# Patient Record
Sex: Female | Born: 1950 | Race: Black or African American | Hispanic: No | Marital: Single | Smoking: Never smoker
Health system: Southern US, Community
[De-identification: ages and names within clinical notes are randomized; demographics above are authoritative.]

## PROBLEM LIST (undated history)

## (undated) DIAGNOSIS — R1310 Dysphagia, unspecified: Secondary | ICD-10-CM

## (undated) DIAGNOSIS — R058 Other specified cough: Secondary | ICD-10-CM

## (undated) DIAGNOSIS — M1990 Unspecified osteoarthritis, unspecified site: Secondary | ICD-10-CM

## (undated) DIAGNOSIS — I872 Venous insufficiency (chronic) (peripheral): Secondary | ICD-10-CM

## (undated) DIAGNOSIS — H401131 Primary open-angle glaucoma, bilateral, mild stage: Secondary | ICD-10-CM

## (undated) DIAGNOSIS — D573 Sickle-cell trait: Secondary | ICD-10-CM

## (undated) DIAGNOSIS — I5032 Chronic diastolic (congestive) heart failure: Secondary | ICD-10-CM

## (undated) DIAGNOSIS — D689 Coagulation defect, unspecified: Secondary | ICD-10-CM

## (undated) DIAGNOSIS — N3281 Overactive bladder: Secondary | ICD-10-CM

## (undated) DIAGNOSIS — D72829 Elevated white blood cell count, unspecified: Secondary | ICD-10-CM

## (undated) DIAGNOSIS — N183 Chronic kidney disease, stage 3 unspecified: Secondary | ICD-10-CM

## (undated) DIAGNOSIS — K7689 Other specified diseases of liver: Secondary | ICD-10-CM

## (undated) DIAGNOSIS — R011 Cardiac murmur, unspecified: Secondary | ICD-10-CM

## (undated) DIAGNOSIS — I890 Lymphedema, not elsewhere classified: Secondary | ICD-10-CM

## (undated) DIAGNOSIS — R05 Cough: Secondary | ICD-10-CM

## (undated) DIAGNOSIS — R002 Palpitations: Secondary | ICD-10-CM

## (undated) DIAGNOSIS — I82409 Acute embolism and thrombosis of unspecified deep veins of unspecified lower extremity: Secondary | ICD-10-CM

## (undated) DIAGNOSIS — J302 Other seasonal allergic rhinitis: Secondary | ICD-10-CM

## (undated) DIAGNOSIS — F329 Major depressive disorder, single episode, unspecified: Secondary | ICD-10-CM

## (undated) DIAGNOSIS — R7309 Other abnormal glucose: Secondary | ICD-10-CM

## (undated) DIAGNOSIS — I771 Stricture of artery: Secondary | ICD-10-CM

## (undated) DIAGNOSIS — R059 Cough, unspecified: Secondary | ICD-10-CM

## (undated) DIAGNOSIS — K219 Gastro-esophageal reflux disease without esophagitis: Secondary | ICD-10-CM

## (undated) DIAGNOSIS — J309 Allergic rhinitis, unspecified: Secondary | ICD-10-CM

## (undated) DIAGNOSIS — N281 Cyst of kidney, acquired: Secondary | ICD-10-CM

## (undated) DIAGNOSIS — F419 Anxiety disorder, unspecified: Secondary | ICD-10-CM

## (undated) DIAGNOSIS — I8290 Acute embolism and thrombosis of unspecified vein: Secondary | ICD-10-CM

## (undated) DIAGNOSIS — R2681 Unsteadiness on feet: Secondary | ICD-10-CM

## (undated) DIAGNOSIS — E119 Type 2 diabetes mellitus without complications: Secondary | ICD-10-CM

## (undated) DIAGNOSIS — L0390 Cellulitis, unspecified: Secondary | ICD-10-CM

## (undated) DIAGNOSIS — D649 Anemia, unspecified: Secondary | ICD-10-CM

## (undated) DIAGNOSIS — L03115 Cellulitis of right lower limb: Secondary | ICD-10-CM

## (undated) HISTORY — DX: Cellulitis of right lower limb: L03.115

## (undated) HISTORY — DX: Coagulation defect, unspecified: D68.9

## (undated) HISTORY — DX: Other seasonal allergic rhinitis: J30.2

## (undated) HISTORY — DX: Dysphagia, unspecified: R13.10

## (undated) HISTORY — DX: Unsteadiness on feet: R26.81

## (undated) HISTORY — DX: Stricture of artery: I77.1

## (undated) HISTORY — DX: Elevated white blood cell count, unspecified: D72.829

## (undated) HISTORY — DX: Chronic diastolic (congestive) heart failure: I50.32

## (undated) HISTORY — DX: Other abnormal glucose: R73.09

## (undated) HISTORY — DX: Sickle-cell trait: D57.3

## (undated) HISTORY — DX: Lymphedema, not elsewhere classified: I89.0

## (undated) HISTORY — DX: Anxiety disorder, unspecified: F41.9

## (undated) HISTORY — DX: Morbid (severe) obesity due to excess calories: E66.01

## (undated) HISTORY — DX: Overactive bladder: N32.81

## (undated) HISTORY — DX: Venous insufficiency (chronic) (peripheral): I87.2

## (undated) HISTORY — DX: Other specified diseases of liver: K76.89

## (undated) HISTORY — DX: Primary open-angle glaucoma, bilateral, mild stage: H40.1131

## (undated) HISTORY — DX: Cellulitis, unspecified: L03.90

## (undated) HISTORY — DX: Type 2 diabetes mellitus without complications: E11.9

## (undated) HISTORY — DX: Cough, unspecified: R05.9

## (undated) HISTORY — DX: Unspecified osteoarthritis, unspecified site: M19.90

## (undated) HISTORY — DX: Chronic kidney disease, stage 3 unspecified: N18.30

## (undated) HISTORY — PX: COLONOSCOPY: SHX174

## (undated) HISTORY — DX: Acute embolism and thrombosis of unspecified deep veins of unspecified lower extremity: I82.409

## (undated) HISTORY — DX: Cyst of kidney, acquired: N28.1

## (undated) HISTORY — DX: Other specified cough: R05.8

## (undated) HISTORY — DX: Cough: R05

## (undated) HISTORY — DX: Palpitations: R00.2

## (undated) HISTORY — DX: Gastro-esophageal reflux disease without esophagitis: K21.9

## (undated) HISTORY — DX: Allergic rhinitis, unspecified: J30.9

## (undated) HISTORY — DX: Cardiac murmur, unspecified: R01.1

## (undated) HISTORY — DX: Acute embolism and thrombosis of unspecified vein: I82.90

## (undated) HISTORY — DX: Major depressive disorder, single episode, unspecified: F32.9

## (undated) HISTORY — DX: Anemia, unspecified: D64.9

---

## 1973-09-13 HISTORY — PX: COSMETIC SURGERY: SHX468

## 1998-08-20 ENCOUNTER — Other Ambulatory Visit: Admission: RE | Admit: 1998-08-20 | Discharge: 1998-08-20 | Payer: Self-pay | Admitting: *Deleted

## 1999-09-21 ENCOUNTER — Other Ambulatory Visit: Admission: RE | Admit: 1999-09-21 | Discharge: 1999-09-21 | Payer: Self-pay | Admitting: *Deleted

## 2000-01-13 ENCOUNTER — Other Ambulatory Visit: Admission: RE | Admit: 2000-01-13 | Discharge: 2000-01-13 | Payer: Self-pay | Admitting: *Deleted

## 2000-01-13 ENCOUNTER — Encounter (INDEPENDENT_AMBULATORY_CARE_PROVIDER_SITE_OTHER): Payer: Self-pay

## 2000-09-13 DIAGNOSIS — I82409 Acute embolism and thrombosis of unspecified deep veins of unspecified lower extremity: Secondary | ICD-10-CM

## 2000-09-13 DIAGNOSIS — D649 Anemia, unspecified: Secondary | ICD-10-CM

## 2000-09-13 HISTORY — DX: Acute embolism and thrombosis of unspecified deep veins of unspecified lower extremity: I82.409

## 2000-09-13 HISTORY — DX: Anemia, unspecified: D64.9

## 2001-01-12 ENCOUNTER — Other Ambulatory Visit: Admission: RE | Admit: 2001-01-12 | Discharge: 2001-01-12 | Payer: Self-pay | Admitting: *Deleted

## 2001-06-08 ENCOUNTER — Ambulatory Visit: Admission: RE | Admit: 2001-06-08 | Discharge: 2001-06-08 | Payer: Self-pay | Admitting: Internal Medicine

## 2001-06-09 ENCOUNTER — Inpatient Hospital Stay (HOSPITAL_COMMUNITY): Admission: AD | Admit: 2001-06-09 | Discharge: 2001-06-13 | Payer: Self-pay | Admitting: Internal Medicine

## 2001-07-13 ENCOUNTER — Encounter: Payer: Self-pay | Admitting: *Deleted

## 2001-07-14 HISTORY — PX: TOTAL ABDOMINAL HYSTERECTOMY: SHX209

## 2001-07-19 ENCOUNTER — Inpatient Hospital Stay (HOSPITAL_COMMUNITY): Admission: RE | Admit: 2001-07-19 | Discharge: 2001-07-21 | Payer: Self-pay | Admitting: *Deleted

## 2001-07-19 ENCOUNTER — Encounter (INDEPENDENT_AMBULATORY_CARE_PROVIDER_SITE_OTHER): Payer: Self-pay | Admitting: Specialist

## 2002-05-11 ENCOUNTER — Ambulatory Visit (HOSPITAL_COMMUNITY): Admission: RE | Admit: 2002-05-11 | Discharge: 2002-05-11 | Payer: Self-pay | Admitting: Occupational Medicine

## 2002-05-23 ENCOUNTER — Encounter: Admission: RE | Admit: 2002-05-23 | Discharge: 2002-05-23 | Payer: Self-pay | Admitting: Occupational Medicine

## 2002-05-23 ENCOUNTER — Encounter: Payer: Self-pay | Admitting: Occupational Medicine

## 2002-07-19 ENCOUNTER — Encounter: Payer: Self-pay | Admitting: Emergency Medicine

## 2002-07-19 ENCOUNTER — Emergency Department (HOSPITAL_COMMUNITY): Admission: EM | Admit: 2002-07-19 | Discharge: 2002-07-19 | Payer: Self-pay | Admitting: Emergency Medicine

## 2002-11-29 ENCOUNTER — Ambulatory Visit (HOSPITAL_COMMUNITY): Admission: RE | Admit: 2002-11-29 | Discharge: 2002-11-29 | Payer: Self-pay | Admitting: Orthopedic Surgery

## 2003-07-01 ENCOUNTER — Ambulatory Visit (HOSPITAL_COMMUNITY): Admission: RE | Admit: 2003-07-01 | Discharge: 2003-07-01 | Payer: Self-pay | Admitting: Internal Medicine

## 2003-07-01 ENCOUNTER — Encounter: Payer: Self-pay | Admitting: Internal Medicine

## 2006-05-20 ENCOUNTER — Ambulatory Visit: Payer: Self-pay | Admitting: Internal Medicine

## 2006-08-19 ENCOUNTER — Ambulatory Visit: Payer: Self-pay | Admitting: Internal Medicine

## 2006-11-11 ENCOUNTER — Ambulatory Visit: Payer: Self-pay | Admitting: Internal Medicine

## 2007-03-13 ENCOUNTER — Ambulatory Visit: Payer: Self-pay | Admitting: Internal Medicine

## 2007-08-01 DIAGNOSIS — D573 Sickle-cell trait: Secondary | ICD-10-CM | POA: Insufficient documentation

## 2007-08-01 DIAGNOSIS — J309 Allergic rhinitis, unspecified: Secondary | ICD-10-CM | POA: Insufficient documentation

## 2007-08-01 DIAGNOSIS — I82409 Acute embolism and thrombosis of unspecified deep veins of unspecified lower extremity: Secondary | ICD-10-CM | POA: Insufficient documentation

## 2007-08-01 DIAGNOSIS — H409 Unspecified glaucoma: Secondary | ICD-10-CM | POA: Insufficient documentation

## 2007-08-01 DIAGNOSIS — I872 Venous insufficiency (chronic) (peripheral): Secondary | ICD-10-CM | POA: Insufficient documentation

## 2007-08-01 DIAGNOSIS — J45909 Unspecified asthma, uncomplicated: Secondary | ICD-10-CM | POA: Insufficient documentation

## 2007-10-13 ENCOUNTER — Encounter: Payer: Self-pay | Admitting: Internal Medicine

## 2008-02-06 ENCOUNTER — Ambulatory Visit: Payer: Self-pay | Admitting: Vascular Surgery

## 2008-02-06 DIAGNOSIS — F32A Depression, unspecified: Secondary | ICD-10-CM

## 2008-02-06 HISTORY — DX: Depression, unspecified: F32.A

## 2008-06-21 ENCOUNTER — Ambulatory Visit: Payer: Self-pay | Admitting: Vascular Surgery

## 2008-07-08 ENCOUNTER — Ambulatory Visit: Payer: Self-pay | Admitting: Oncology

## 2008-07-17 LAB — CBC & DIFF AND RETIC
BASO%: 0 % (ref 0.0–2.0)
EOS%: 1.2 % (ref 0.0–7.0)
HCT: 35.6 % (ref 34.8–46.6)
LYMPH%: 15.7 % (ref 14.0–48.0)
MCH: 28 pg (ref 26.0–34.0)
MCHC: 33.6 g/dL (ref 32.0–36.0)
MCV: 83.2 fL (ref 81.0–101.0)
MONO%: 3.5 % (ref 0.0–13.0)
NEUT%: 79.6 % — ABNORMAL HIGH (ref 39.6–76.8)
Platelets: 268 10*3/uL (ref 145–400)

## 2008-07-17 LAB — ERYTHROCYTE SEDIMENTATION RATE: Sed Rate: 25 mm/hr (ref 0–30)

## 2008-07-17 LAB — MORPHOLOGY: RBC Comments: NORMAL

## 2008-07-18 LAB — COMPREHENSIVE METABOLIC PANEL
CO2: 27 mEq/L (ref 19–32)
Calcium: 10.6 mg/dL — ABNORMAL HIGH (ref 8.4–10.5)
Chloride: 104 mEq/L (ref 96–112)
Creatinine, Ser: 0.86 mg/dL (ref 0.40–1.20)
Glucose, Bld: 98 mg/dL (ref 70–99)
Total Bilirubin: 0.4 mg/dL (ref 0.3–1.2)

## 2008-07-18 LAB — LACTATE DEHYDROGENASE: LDH: 209 U/L (ref 94–250)

## 2008-07-18 LAB — ANA: Anti Nuclear Antibody(ANA): NEGATIVE

## 2008-07-31 ENCOUNTER — Encounter: Payer: Self-pay | Admitting: Internal Medicine

## 2008-08-05 ENCOUNTER — Ambulatory Visit (HOSPITAL_COMMUNITY): Admission: RE | Admit: 2008-08-05 | Discharge: 2008-08-05 | Payer: Self-pay | Admitting: Oncology

## 2008-08-14 ENCOUNTER — Encounter: Admission: RE | Admit: 2008-08-14 | Discharge: 2008-09-12 | Payer: Self-pay | Admitting: Oncology

## 2008-08-15 ENCOUNTER — Ambulatory Visit (HOSPITAL_COMMUNITY): Admission: RE | Admit: 2008-08-15 | Discharge: 2008-08-15 | Payer: Self-pay | Admitting: Oncology

## 2008-08-23 ENCOUNTER — Encounter: Payer: Self-pay | Admitting: Internal Medicine

## 2008-08-23 ENCOUNTER — Ambulatory Visit: Payer: Self-pay | Admitting: Oncology

## 2008-09-16 ENCOUNTER — Encounter: Admission: RE | Admit: 2008-09-16 | Discharge: 2008-09-20 | Payer: Self-pay | Admitting: Oncology

## 2009-11-17 ENCOUNTER — Ambulatory Visit: Payer: Self-pay | Admitting: Vascular Surgery

## 2009-11-17 ENCOUNTER — Encounter (INDEPENDENT_AMBULATORY_CARE_PROVIDER_SITE_OTHER): Payer: Self-pay | Admitting: Emergency Medicine

## 2009-11-17 ENCOUNTER — Emergency Department (HOSPITAL_COMMUNITY): Admission: EM | Admit: 2009-11-17 | Discharge: 2009-11-17 | Payer: Self-pay | Admitting: Emergency Medicine

## 2009-11-24 ENCOUNTER — Encounter (INDEPENDENT_AMBULATORY_CARE_PROVIDER_SITE_OTHER): Payer: Self-pay | Admitting: Emergency Medicine

## 2009-11-24 ENCOUNTER — Emergency Department (HOSPITAL_COMMUNITY): Admission: EM | Admit: 2009-11-24 | Discharge: 2009-11-24 | Payer: Self-pay | Admitting: Emergency Medicine

## 2009-12-11 ENCOUNTER — Ambulatory Visit: Payer: Self-pay | Admitting: Vascular Surgery

## 2010-05-19 ENCOUNTER — Encounter (INDEPENDENT_AMBULATORY_CARE_PROVIDER_SITE_OTHER): Payer: Self-pay | Admitting: *Deleted

## 2010-05-29 ENCOUNTER — Encounter (INDEPENDENT_AMBULATORY_CARE_PROVIDER_SITE_OTHER): Payer: Self-pay | Admitting: *Deleted

## 2010-06-03 ENCOUNTER — Ambulatory Visit: Payer: Self-pay | Admitting: Internal Medicine

## 2010-07-06 ENCOUNTER — Ambulatory Visit: Payer: Self-pay | Admitting: Internal Medicine

## 2010-07-07 ENCOUNTER — Telehealth: Payer: Self-pay | Admitting: Internal Medicine

## 2010-10-15 NOTE — Procedures (Signed)
Summary: Colonoscopy  Patient: Jessica Yates Note: All result statuses are Final unless otherwise noted.  Tests: (1) Colonoscopy (COL)   COL Colonoscopy           DONE     [facility]     [address]     [place], [place]  [address]           COLONOSCOPY PROCEDURE REPORT           PATIENT:  Jessica Yates, Jessica Yates  MR#:  [id number]     BIRTHDATE:  06-28-1951, 59 yrs. old  GENDER:  female     ENDOSCOPIST:  Hedwig Morton. Juanda Chance, MD     REF. BY:  Kyra Manges, M.D.     PROCEDURE DATE:  07/06/2010     PROCEDURE:  Colonoscopy 09323     ASA CLASS:  Class I     INDICATIONS:  Routine Risk Screening     MEDICATIONS:   Versed 8 mg, Fentanyl 75 mcg           DESCRIPTION OF PROCEDURE:   After the risks benefits and     alternatives of the procedure were thoroughly explained, informed     consent was obtained.  Digital rectal exam was performed and     revealed no rectal masses.   The LB CF-[id number] endoscope     was introduced through the anus and advanced to the cecum, which     was identified by both the appendix and ileocecal valve, without     limitations.  The quality of the prep was excellent, using     MoviPrep.  The instrument was then slowly withdrawn as the colon     was fully examined.     <<PROCEDUREIMAGES>>           FINDINGS:  No polyps or cancers were seen (see image1, image2,     image3, image4, and image5).   Retroflexed views in the rectum     revealed no abnormalities.    The scope was then withdrawn from     the patient and the procedure completed.           COMPLICATIONS:  None     ENDOSCOPIC IMPRESSION:     1) No polyps or cancers     RECOMMENDATIONS:     1) high fiber diet     REPEAT EXAM:  In 10 year(s) for.  no risk factors for colon     cancer, recall according to the guidelines           ______________________________     Hedwig Morton. Juanda Chance, MD           CC:           n.     eSIGNED:   Hedwig Morton. Brodie at 07/06/2010 02:22 PM           Antoine Poche, [id number]  Note: An exclamation mark (!) indicates a result that was not dispersed into the flowsheet. Document Creation Date: 07/06/2010 2:24 PM _______________________________________________________________________  (1) Order result status: Final Collection or observation date-time: 07/06/2010 14:17 Requested date-time:  Receipt date-time:  Reported date-time:  Referring Physician:   Ordering Physician: Lina Sar [id number]) Specimen Source:  Source: Launa Grill Order Number: [id number] Lab site:   Appended Document: Colonoscopy    Clinical Lists Changes  Observations: Added new observation of COLONNXTDUE: 06/2020 (07/06/2010 16:03)

## 2010-10-15 NOTE — Progress Notes (Signed)
Summary: Speak directly w/Dr. Juanda Chance  Phone Note Call from Patient Call back at Home Phone [phone] Call back at [phone]   Caller: Patient Call For: Dr. Juanda Chance Reason for Call: Talk to Nurse Summary of Call: Requsting to speak directly w/Dr. Juanda Chance Initial call taken by: Karna Christmas,  July 07, 2010 12:21 PM  Follow-up for Phone Call        Patient called and wanted to "speak directly to Dr. Juanda Chance." Patient was concerned about the pictures of her colonoscopy from 07/06/10. She states her sister's pictures were not as dark as her pictures and this worried her. Explained to patient that different equipment may cause pictures to look a little different. Patient is also worried about a birthmark she has on the middle of her buttocks. She wants to know if this could cause cancer. Patient also asked questions about IBS symptoms. Answered questions for patient. She continues to request Dr. Juanda Chance call her at the cell or home number. Explained to patient that MD iis in clinic today and that I would give her the message and either MD or I would call with answers to her questions. Follow-up by: Jesse Fall RN,  July 07, 2010 2:49 PM  Additional Follow-up for Phone Call Additional follow up Details #1::        I have spoken to the pt at length, all concerns dissipated. I asked her to see me for an OV if more qquestions. Additional Follow-up by: Hart Carwin MD,  July 07, 2010 6:12 PM

## 2010-10-15 NOTE — Letter (Signed)
Summary: [facility] Instructions  [facility]  [address] [place], [place] [address]   Phone: [phone]  Fax: [phone]       [facility]    03/10/1951    MRN: [id number]        Procedure Day /Date: Monday 07-06-10     Arrival Time: 1:00 p.m.     Procedure Time: 2:00 p.m.     Location of Procedure:                    _x _  [facility] (4th Floor)                       PREPARATION FOR COLONOSCOPY WITH MOVIPREP   Starting 5 days prior to your procedure  07/01/10 do not eat nuts, seeds, popcorn, corn, beans, peas,  salads, or any raw vegetables.  Do not take any fiber supplements (e.g. Metamucil, Citrucel, and Benefiber).  THE DAY BEFORE YOUR PROCEDURE         DATE:  07/05/10  DAY:  Sunday  1.  Drink clear liquids the entire day-NO SOLID FOOD  2.  Do not drink anything colored red or purple.  Avoid juices with pulp.  No orange juice.  3.  Drink at least 64 oz. (8 glasses) of fluid/clear liquids during the day to prevent dehydration and help the prep work efficiently.  CLEAR LIQUIDS INCLUDE: Water Jello Ice Popsicles Tea (sugar ok, no milk/cream) Powdered fruit flavored drinks Coffee (sugar ok, no milk/cream) Gatorade Juice: apple, white grape, white cranberry  Lemonade Clear bullion, consomm, broth Carbonated beverages (any kind) Strained chicken noodle soup Hard Candy                             4.  In the morning, mix first dose of MoviPrep solution:    Empty 1 Pouch A and 1 Pouch B into the disposable container    Add lukewarm drinking water to the top line of the container. Mix to dissolve    Refrigerate (mixed solution should be used within 24 hrs)  5.  Begin drinking the prep at 5:00 p.m. The MoviPrep container is divided by 4 marks.   Every 15 minutes drink the solution down to the next mark (approximately 8 oz) until the full liter is complete.   6.  Follow completed prep with 16 oz of clear liquid of your choice  (Nothing red or purple).  Continue to drink clear liquids until bedtime.  7.  Before going to bed, mix second dose of MoviPrep solution:    Empty 1 Pouch A and 1 Pouch B into the disposable container    Add lukewarm drinking water to the top line of the container. Mix to dissolve    Refrigerate  THE DAY OF YOUR PROCEDURE      DATE:  07-06-10  DAY:  Monday  Beginning at  9:00 a.m. (5 hours before procedure):         1. Every 15 minutes, drink the solution down to the next mark (approx 8 oz) until the full liter is complete.  2. Follow completed prep with 16 oz. of clear liquid of your choice.    3. You may drink clear liquids until  12:00 p.m.  (2 HOURS BEFORE PROCEDURE).   MEDICATION INSTRUCTIONS  Unless otherwise instructed, you should take regular prescription medications with a small sip of water  as early as possible the morning of your procedure.       OTHER INSTRUCTIONS  You will need a responsible adult at least 60 years of age to accompany you and drive you home.   This person must remain in the waiting room during your procedure.  Wear loose fitting clothing that is easily removed.  Leave jewelry and other valuables at home.  However, you may wish to bring a book to read or  an iPod/MP3 player to listen to music as you wait for your procedure to start.  Remove all body piercing jewelry and leave at home.  Total time from sign-in until discharge is approximately 2-3 hours.  You should go home directly after your procedure and rest.  You can resume normal activities the  day after your procedure.  The day of your procedure you should not:   Drive   Make legal decisions   Operate machinery   Drink alcohol   Return to work  You will receive specific instructions about eating, activities and medications before you leave.    The above instructions have been reviewed and explained to me by   Wyona Almas RN  June 03, 2010 3:29 PM     I  fully understand and can verbalize these instructions _____________________________ Date _________

## 2010-10-15 NOTE — Miscellaneous (Signed)
Summary: [facility] Previsit/prep  Clinical Lists Changes  Medications: Added new medication of MOVIPREP 100 GM  SOLR (PEG-KCL-NACL-NASULF-NA ASC-C) As per prep instructions. - Signed Rx of MOVIPREP 100 GM  SOLR (PEG-KCL-NACL-NASULF-NA ASC-C) As per prep instructions.;  #1 x 0;  Signed;  Entered by: Wyona Almas RN;  Authorized by: Hart Carwin MD;  Method used: Electron[facility] to [facility] [address] [phone]*, [address] [place], [place], [place]  [id number], Ph: [phone] or [phone], Fax: [phone] Observations: Added new observation of NKA: T (06/03/2010 14:36)    Prescriptions: MOVIPREP 100 GM  SOLR (PEG-KCL-NACL-NASULF-NA ASC-C) As per prep instructions.  #1 x 0   Entered by:   Wyona Almas RN   Authorized by:   Hart Carwin MD   Signed by:   Wyona Almas RN on 06/03/2010   Method used:   [facility] to        [facility] [phone]* (retail)       [address]       [place], [place]  [id number]       Ph: [phone] or [phone]       Fax: [phone]   RxID:   [id number]

## 2010-10-15 NOTE — Letter (Signed)
Summary: Pre Visit Letter Revised  [facility]  [address] [place], [place] [address]   Phone: [phone]  Fax: [phone]        05/19/2010 MRN: [id number]   [facility] [address] [place], [place]  [address]   Welcome to the Gastroenterology Division at [facility].    You are scheduled to see a nurse for your pre-procedure visit on June 02, 2010 at 10:00am on the 3rd floor at [facility], [address]  We ask that you try to arrive at our office 15 minutes prior to your appointment time to allow for check-in.  Please take a minute to review the attached form.  If you answer "Yes" to one or more of the questions on the first page, we ask that you call the person listed at your earliest opportunity.  If you answer "No" to all of the questions, please complete the rest of the form and bring it to your appointment.    Your nurse visit will consist of discussing your medical and surgical history, your immediate family medical history, and your medications.   If you are unable to list all of your medications on the form, please bring the medication bottles to your appointment and we will list them.  We will need to be aware of both prescribed and over the counter drugs.  We will need to know exact dosage information as well.    Please be prepared to read and sign documents such as consent forms, a financial agreement, and acknowledgement forms.  If necessary, and with your consent, a friend or relative is welcome to sit-in on the nurse visit with you.  Please bring your insurance card so that we may make a copy of it.  If your insurance requires a referral to see a specialist, please bring your referral form from your primary care physician.  No co-pay is required for this nurse visit.     If you cannot keep your appointment, please call [phone] to cancel or reschedule prior to your appointment date.  This allows [place] the opportunity to  schedule an appointment for another patient in need of care.    Thank you for choosing [facility] for your medical needs.  We appreciate the opportunity to care for you.  Please visit [place] at our website  to learn more about our practice.  Sincerely, The Gastroenterology Division

## 2010-11-18 ENCOUNTER — Ambulatory Visit: Payer: Self-pay | Admitting: Vascular Surgery

## 2010-12-02 ENCOUNTER — Ambulatory Visit (INDEPENDENT_AMBULATORY_CARE_PROVIDER_SITE_OTHER): Payer: BC Managed Care – PPO | Admitting: Vascular Surgery

## 2010-12-02 DIAGNOSIS — I890 Lymphedema, not elsewhere classified: Secondary | ICD-10-CM

## 2010-12-03 NOTE — Assessment & Plan Note (Signed)
OFFICE VISIT  Jessica Yates, Jessica Yates DOB:  01-May-1951                                       12/02/2010 ZOXWR#:[id number]  I saw Ms. Zeidan in the office today for continued follow-up of her lymphedema.  I last saw her December 11, 2009.  Since I saw her last, she has had no recurrent episodes of lymphangitis.  Her swelling comes and goes and obviously it increases whenever she is on her feet for any length of time.  She states that she has not gotten new compression stockings in the last year.  She had no fever or chills.  REVIEW OF SYSTEMS:  She has had no chest pain, chest pressure, palpitations or arrhythmias.  PHYSICAL EXAMINATION:  This is a pleasant 60 year old woman who appears her stated age. Blood pressure is 145/87, heart rate is 88, respiratory rate 24. LUNGS:  Clear bilaterally to auscultation. CARDIOVASCULAR:  I do not detect any carotid bruits.  She has a regular rate and rhythm.  She has palpable dorsalis pedis pulses bilaterally.  I cannot palpate posterior tibial pulses because of her leg swelling. EXTREMITIES:  Exam reveals significant bilateral nonpitting edema more significant on the left side.  This is classic for lymphedema.  We have again discussed the importance of intermittent leg elevation and I have written her a prescription for compression stockings with a 20-30 mm pressure gradient.  She would like to be seen on a yearly basis and I will see her back in 1 year.  She knows to call sooner if she has problems.    [name]. Edilia Bo, M.D. Electronically Signed  CSD/MEDQ  D:  12/02/2010  T:  12/03/2010  Job:  [id number]

## 2010-12-06 LAB — PROTIME-INR
INR: 0.88 (ref 0.00–1.49)
Prothrombin Time: 11.9 seconds (ref 11.6–15.2)

## 2010-12-06 LAB — DIFFERENTIAL
Basophils Absolute: 0 10*3/uL (ref 0.0–0.1)
Basophils Relative: 0 % (ref 0–1)
Neutro Abs: 5.9 10*3/uL (ref 1.7–7.7)
Neutrophils Relative %: 78 % — ABNORMAL HIGH (ref 43–77)

## 2010-12-06 LAB — COMPREHENSIVE METABOLIC PANEL
Alkaline Phosphatase: 82 U/L (ref 39–117)
BUN: 7 mg/dL (ref 6–23)
Chloride: 105 mEq/L (ref 96–112)
Glucose, Bld: 100 mg/dL — ABNORMAL HIGH (ref 70–99)
Potassium: 4.2 mEq/L (ref 3.5–5.1)
Total Bilirubin: 0.6 mg/dL (ref 0.3–1.2)

## 2010-12-06 LAB — CBC
HCT: 33.8 % — ABNORMAL LOW (ref 36.0–46.0)
Hemoglobin: 10.7 g/dL — ABNORMAL LOW (ref 12.0–15.0)
RDW: 15.4 % (ref 11.5–15.5)

## 2010-12-06 LAB — APTT: aPTT: 32 seconds (ref 24–37)

## 2011-01-22 ENCOUNTER — Ambulatory Visit (INDEPENDENT_AMBULATORY_CARE_PROVIDER_SITE_OTHER): Payer: BC Managed Care – PPO

## 2011-01-22 ENCOUNTER — Inpatient Hospital Stay (INDEPENDENT_AMBULATORY_CARE_PROVIDER_SITE_OTHER)
Admission: RE | Admit: 2011-01-22 | Discharge: 2011-01-22 | Disposition: A | Discharge status: Home / Self Care | Payer: BC Managed Care – PPO | Source: Ambulatory Visit | Attending: Emergency Medicine | Admitting: Emergency Medicine

## 2011-01-22 DIAGNOSIS — S9030XA Contusion of unspecified foot, initial encounter: Secondary | ICD-10-CM

## 2011-01-22 DIAGNOSIS — S60229A Contusion of unspecified hand, initial encounter: Secondary | ICD-10-CM

## 2011-01-26 NOTE — Assessment & Plan Note (Signed)
[facility]                             PULMONARY OFFICE NOTE   Jessica Yates, Jessica Yates               MRN:          [id number]  DATE:03/13/2007                            DOB:          12-11-1950    PROBLEM LIST:  1. Asthma/cough.  2. Allergic rhinitis.  3. Peripheral venous insufficiency.  4. Deep venous thrombosis left leg 2002.  5. Sickle cell trait.  6. Glaucoma.   HISTORY:  Since last year, diagnosed with glaucoma.  Cough in May x2  weeks with chest tightness attributed to environmental conditions and  now resolved.   MEDICATIONS:  1. Fish oil.  2. Symbicort 160/4.5.  3. Proventil HFA inhaler p.r.n.  4. No medication allergy.   OBJECTIVE:  VITAL SIGNS:  Weight 191 pounds, BP 128/64, pulse 98, room  air saturation 98%.  There is palpable crepitus along the right  sternocostal joint which appears to be consistent with some slippage at  those joints.  She relates this to a history of a fall onto her shoulder  but is not having any pain.  Chest is otherwise quiet without wheeze.  Heart sounds are regular without murmur.   IMPRESSION:  1. Asthma, resolved after exacerbation in the spring.  2. Chronic peripheral venous disease with chronic ankle edema.  3. 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   PLAN:  1. Refill Symbicort 160/4.5 and Proventil inhaler.  2. Schedule pulmonary function tests.  3. Schedule return 2 months, earlier p.r.n.     Clinton D. Maple Hudson, MD, Tonny Bollman, FACP  Electronically Signed    CDY/MedQ  DD: 03/13/2007  DT: 03/14/2007  Job #: [id number]

## 2011-01-26 NOTE — Procedures (Signed)
DUPLEX DEEP VENOUS EXAM - LOWER EXTREMITY   INDICATION:  Left leg pain and swelling.   HISTORY:  Edema:  Yes.  Trauma/Surgery:  No.  Pain:  Yes.  PE:  No.  Previous DVT:  Yes in 2002.  Anticoagulants:  Other:   DUPLEX EXAM:                CFV   SFV   PopV  PTV    GSV                R  L  R  L  R  L  R   L  R  L  Thrombosis    o  o     o     o      o     o  Spontaneous   +  +     +     +      +     +  Phasic        +  +     +     +      +     +  Augmentation  +  +     +     +      +     +  Compressible  +  +     +     +      +     +  Competent     +  +     +     +      +     +   Legend:  + - yes  o - no  p - partial  D - decreased   IMPRESSION:  No evidence of deep venous thrombosis noted in the left  leg.   Notified Dr. Domingo Pulse with results.    _____________________________  Quita Skye Hart [place], M.D.   MG/MEDQ  D:  06/21/2008  T:  06/21/2008  Job:  [id number]

## 2011-01-26 NOTE — Assessment & Plan Note (Signed)
OFFICE VISIT   Jessica Yates, Jessica Yates  DOB:  1951/06/12                                       02/06/2008  ZOXWR#:[id number]   I saw the patient in the office today in consultation concerning her leg  swelling.  This is a pleasant 60 year old woman who was referred by Dr.  Domingo Pulse.  She states that she has had problems with swelling in  her legs since she was 60 years old and this has been attributed to  lymphedema.  She has worn compression stockings in the past but found  them to be quite a nuisance.  She also intermittently elevates her legs  some.   In September 2002, she did have a DVT in the left leg which was treated  temporarily with Coumadin.  She has had no further episodes of DVT since  then.  She has had a hysterectomy in November of 2002.  She has never  had surgery in the groins and no history of radiation therapy in the  abdomen or groins.  She states that elevation and compression therapy  does help her swelling some and standing aggravates her symptoms.  She  has had no real pain associated with the swelling in her legs.   PAST MEDICAL HISTORY:  Is fairly unremarkable.  She denies any history  of diabetes, hypertension, history of previous myocardial infarction,  history of congestive heart failure history of COPD.   FAMILY HISTORY:  Her mother had diabetes and hypertension.  She also an  enlarged heart.  She is unaware of any history of premature  cardiovascular disease.   SOCIAL HISTORY:  She is single.  She does not have any children.  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  She  does not use tobacco.   REVIEW OF SYSTEMS AND MEDICATIONS:  Are documented on the medical  history form in her chart.   PHYSICAL EXAMINATION:  This is a pleasant 59 year old woman who appears  her stated age.  Blood pressure is 138/82, heart rate is 98.  Neck is  supple.  There is no cervical lymphadenopathy.  I  do not detect any  carotid bruits.  Lungs are clear bilaterally to auscultation.  On  cardiac exam, she has a regular rate and rhythm. __________   Finally, I have given her the number for Oleh Genin, physical  therapist, who does massage therapy and is a certified lymphedema  therapist.  I have explained that this problem is not a serious problem  but it can be a nuisance, but that it is a chronic problem with no real  cure.  I would be happy to see her back in the future if her symptoms  worsen or she has other vascular issues.  She had a large number of  questions and I have done my best to answer all of her questions here in  the office today.   [name]. Edilia Bo, M.D.  Electronically Signed   CSD/MEDQ  D:  02/06/2008  T:  02/07/2008  Job:  [id number]

## 2011-01-26 NOTE — Assessment & Plan Note (Signed)
OFFICE VISIT   Jessica Yates, Jessica Yates  DOB:  27-Dec-1950                                       12/11/2009  ZOXWR#:[id number]   I saw the patient in the office today for continued followup of her  lymphedema.  I last saw her approximately 2 years ago and recommended  intermittent elevation therapy and compression therapy.  Since I saw her  last she had been doing reasonably well until early March she developed  some lymphangitis.  She was started on p.o. clindamycin which she took  for a week.  Shen then returned to the emergency department and was  started on doxycycline which she took for 2 weeks.  Her cellulitis and  lymphangitis has resolved.  She comes in with a chief complaint of  worsening left leg swelling.  Of note, she did have a venous duplex scan  done which showed no evidence of DVT when she was seen in the emergency  department.  She has had no real pain associated with the swelling in  her leg.  She has had no shortness of breath.  Since I saw her last she  does try to elevate her legs intermittently.  She has not really been  wearing compression stockings faithfully.  She also had gone to the  massage therapist for lymphedema massage and this did help some.   The patient has had some mild paresthesias in the lateral aspect of her  left foot.  These began gradually several months ago.  They have been  stable and have not progressed.  There are no aggravating or alleviating  factors.  There is no associated symptoms.  These symptoms were  described as mild.   PAST MEDICAL HISTORY:  Significant for mild asthma.  She has no history  of diabetes, hypertension, hypercholesterolemia, history of previous  myocardial infarction, history of congestive heart failure.   FAMILY HISTORY:  There is no history of premature cardiovascular  disease.   SOCIAL HISTORY:  She is single.  She does not have any children.  She  does not smoke cigarettes.   REVIEW OF SYSTEMS:  She has had some weight gain.  She is 195 pounds.  She has had no change in her appetite.  CARDIOVASCULAR:  She has had no chest pain, chest pressure, palpitations  or arrhythmias.  She has had no claudication, rest pain or nonhealing  ulcers.  She has had no history of DVT or phlebitis since her last  visit.  Pulmonary, GI, GU, neurologic, musculoskeletal, psychiatric, ENT and  hematologic review of systems is unremarkable.   PHYSICAL EXAMINATION:  General:  This is a pleasant 60 year old woman  who appears her stated age.  Vital signs:  Her blood pressure is 118/75,  heart rate is 94, temperature 97.6.  HEENT:  Unremarkable.  Lungs:  Are  clear bilaterally to auscultation without rales, rhonchi or wheezing.  Cardiovascular:  I do not appreciate any carotid bruits.  She has a  regular rate and rhythm.  No murmurs are appreciated.  She has 3+  pitting edema on the left and 1+ pitting edema on the right.  I cannot  palpate pedal pulses because of her swelling but both feet are warm and  well-perfused.  Abdomen:  Soft and nontender.  No masses are  appreciated.  Musculoskeletal:  There are no major deformities  or  cyanosis.  Neurologic:  She has had no focal weakness or paresthesias.  Skin:  She does have some hyperpigmentation more significantly on the  left side consistent with some mild chronic venous insufficiency.   She did have a previous CT scan and MRI which I have reviewed and this  was from 2009 and showed no evidence of intrinsic mass compressing the  lymphatics to explain her lymphedema.  She had some renal cysts which  appeared benign.   I have reviewed her venous duplex scan which was done on 11/17/2009 and  showed no evidence of DVT.  I have also reviewed her followup venous  duplex scan on 11/24/2009 which showed no evidence of DVT.   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  We discussed that this  is a chronic problem and that  she will have this the rest of her life.  We have discussed the  importance of intermittent leg elevation and I have instructed her on  the proper positioning for this.  I have also written her a prescription  for a thigh high compression stocking with a 20-30 mmHg gradient.  She  felt that the 30-40 mm gradient stockings were too tight and she is less  likely to wear these.  When she does have episodes of cellulitis or  lymphangitis I have explained that she would likely require 4-6 weeks of  antibiotics versus normal 1 week course of antibiotics.  I would be  happy to see her back in the future if any new problems arise.     [name]. Edilia Bo, M.D.  Electronically Signed   CSD/MEDQ  D:  12/11/2009  T:  12/12/2009  Job:  [id number]

## 2011-01-29 NOTE — Discharge Summary (Signed)
[facility]  Patient:    Jessica Yates, Jessica Yates [facility] Visit Number: [id number] MRN: [id number]          Service Type: GYN Location: 4W 0460 02 Attending Physician:  Lanna Poche Dictated by:   Darcella Cheshire, M.D. Admit Date:  07/19/2001 Discharge Date: 07/21/2001   CC:         Darcella Cheshire, M.D. (2)   Discharge Summary  HISTORY AND HOSPITAL COURSE:  The patient is a 60 year old black single female admitted on July 19, 2001, for the purpose of doing a total abdominal hysterectomy.  The patient was taken to the operating room from outpatient admission where a total abdominal hysterectomy was done removing a large, very fixed tumor from her pelvis.  The patients operative and postoperative course has been uneventful and being discharged on postoperative day #2 afebrile and in good condition, given the usual postoperative instructions.  She is to come to the office next Tuesday for stitch removal and further instructions will be given at that time.  LABORATORY AND ACCESSORY DATA:  Pathology report showed mild chronic cervicitis, a nabothian cyst, benign endometrial polyp, multiple leiomyomata, and focal adenomyosis.  Hemoglobin postoperatively was 12 g and hematocrit was 35.5.  The patients other laboratory studies were within normal limits. Radiology showed mild peribronchial thickening, no evidence of focal air space disease, anterior wedging of the mid thoracic vertebra, with a note of undetermined chronicity.  EKG showed normal sinus rhythm, nonspecific T-wave abnormality.  There was no old tracing which to compare.  FINAL CLINICAL IMPRESSION:  Fibroid leiomyomata uteri, large and fixed adenomyosis uteri.       OPERATION:  Total abdominal hysterectomy. Dictated by:   Darcella Cheshire, M.D. Attending Physician:  Lanna Poche DD:  07/21/01 TD:  07/22/01 Job: [id number]

## 2011-01-29 NOTE — Discharge Summary (Signed)
[place]  Patient:    Jessica Yates, Jessica Yates Visit Number: [id number] MRN: [id number]          Service Type: MED Location: [id number] Attending Physician:  Virgia Land Dictated by:   Lindell Spar. Chestine Spore, M.D. Admit Date:  06/09/2001 Discharge Date: 06/13/2001                             Discharge Summary  DISCHARGE DIAGNOSES: 1. Venous and _________ obstruction secondary to an extensive quite    enlarged uterine leiomyoma. 2. Menometrorrhagia with iron-deficiency anemia.  REASON FOR ADMISSION:  Jessica Yates is a 60 year old African-American woman who presented to the office with a four to five-day history of progressive pain and swelling of the left leg and minimal swelling of the right leg.  An outpatient venous Doppler performed at [facility] revealed evidence of thrombosis of the greater saphenous vein and the patient was admitted for short-term anticoagulation, bedrest with elevation of the legs, and evaluation of a pelvic mass.  PERTINENT PHYSICAL FINDINGS:  GENERAL:  She is a well-developed, well-nourished African-American woman quite anxious but in no acute distress.  VITAL SIGNS:  Blood pressure 134/72, pulse rate of 92, respiratory rate 20, weight 177, she is 5 feet 9 inches.  HEENT:  Unremarkable. There is no scleral icterus, no conjunctival pallor.  NECK: There is no jugular venous distension and no thyromegaly.  CHEST:  No splinting tenderness or deformities and her lungs were clear.  CARDIOVASCULAR:  No murmurs, rubs, or gallops.  ABDOMEN:  She has mild to moderate left lower quadrant tenderness with an enlarged, firm mass in the mid lower abdominal area extending into the left lower quadrant.  EXTREMITIES:  She had 4+ edema of the left lower extremity extending from the feet into the thighs with tenderness along the greater saphenous distribution. She had evidence of varicosities and superficial  venules.  There was trace edema of the right lower extremity but no associated tenderness.  NEUROLOGIC:  She was alert, oriented, cooperative, without any focal sensory, motor or reflex deficits.  PERTINENT LAB AND X-RAY DATA:  Her EKG revealed a normal sinus rhythm with a rate of 70, nonspecific T-wave abnormalities but no acute STT wave changes.  A CT scan of her abdomen revealed a large uterine fibroid with mass effect on the inferior vena cava common iliac veins and the ureters.  The uterus measured 14 x 13 x 16 cm with multiple lobular components felt to be consistent with a degenerating fibroid.  Her chest x-ray revealed a normal cardiomediastinal contours.  There was mild biapical pulmonary scaring.  No other active changes were noted.  A stool for occult blood was positive x 1 and negative on the second specimen. Her admission INR is normal at 1.0.  The remainder of the patients lab is currently not on the chart.  HOSPITAL COURSE:  The patient was admitted with a working diagnosis of DVT of the greater saphenous vein of the left lower extremity with marked edema and swelling of the left lower extremity from the foot to the thigh.  In addition, the patient has, on physical examination, a significantly enlarged uterine fibroid extending from the midline into the left lower quadrant.  Because of the significant edema and the clinical presentation consistent with DVT, the patient was started on Lovenox for systemic anticoagulation as noted above.  The work-up of her pelvic mass revealed a significantly  enlarged uterine fibroid with a mass effect on the inferior vena cava iliac veins.  It was felt that this mass effect was playing a significant roll in the patients DVT and edema of the left lower extremity.  The patient was seen in consultation by Theresia Majors. Tanda Rockers, M.D., and Darcella Cheshire, M.D., both of whom felt that the patient did have venous obstruction but most likely  secondary to her enlarged uterus and fibroid tumor.  Because of her uterine bleeding, the patient had been treated by Dr. Roberto Scales for almost a year with iron therapy.  It was felt that once the patients coagulation status had been normalized by discontinuing her Lovenox and Coumadin, that the patient could be seen by Dr. Roberto Scales as an outpatient and scheduled for surgery on her enlarged fibroid and uterus.  She was subsequently discharged home on iron therapy but no Coumadin was initiated.  CONDITION ON DISCHARGE:  Improved.  PROGNOSIS:  Fair.  FOLLOW-UP:  She is scheduled to be seen by Dr. Roberto Scales in one week. Dictated by:   Lindell Spar. Chestine Spore, M.D. Attending Physician:  Virgia Land DD:  07/13/99 TD:  07/13/01 Job: [id number]

## 2011-01-29 NOTE — H&P (Signed)
[facility]  Patient:    Jessica Yates, Jessica Yates Visit Number: [id number] MRN: [id number]          Service Type: Attending:  Darcella Cheshire, M.D. Dictated by:   Darcella Cheshire, M.D.                           History and Physical  HISTORY OF PRESENT ILLNESS:  The patient is a 60 year old, black, single female, para 0, who has for some time had a fibroid tumor.  This has not been extremely symptomatic, but has been large.  In May of this year, she came in to see me.  She was very anemic.  Her hemoglobin was below 9.  The fibroid was enlarging and she was having a lot more menorrhagia.  Because of a low hemoglobin, I told her that we need to consider getting this uterus out, but we had to get her hemoglobin up.  So I put her on iron and told her to come back in two months.  On April 06, 2001, she did come back.  Her hemoglobin was up to 11 g.  The uterus then filled all of the posterior pelvis.  It was all the way to the umbilicus and you could not move it.  I told her that we needed to go ahead and get it out, but she did not want to make any arrangements at that time and indeed she kept putting it off.  Then in the latter part of September, she began to have swelling of her left leg.  She saw her medical doctor and the diagnosis of deep venous thrombosis was made.  She was put in the hospital and a lot of studies were done, but they did not find any thrombosis.  Someone suggested that it might be the pelvic mass that was occluding the vessels somewhat and causing her to have this problem.  When she got off of her feet, she seemed to do better.  She had been put on an anticoagulant and so forth.  It was certainly reasonable to go ahead and get the tumor out, but with an anticoagulant we had to wait until that was completely out of her system.  She continued for one reason or another to put the operation off.  Now she has finally decided to go on and have the  surgery. She is being admitted therefore for that purpose.  LMP was July 03, 2001, seven days.  PMP was a month prior.  PAST MEDICAL HISTORY:  She was hospitalized back when she was in her early 35s because her ankles were swelling.  She says that she has had trouble with that off and on all of her life.  In 1974, she had some nasal reconstruction operation done by L. Patseavouras, M.D.  She was hospitalized, of course, recently as mentioned above for the swelling problem.  No other surgery.  No other hospitalizations.  She has never had a baby.  She has never been pregnant.  MARITAL HISTORY:  She has never married.  FAMILY HISTORY:  Her mother, 1, has diabetes, high blood pressure, is blind, and has had breast cancer.  Her father died at age 14 of leukemia.  She has one brother and one sister, both apparently living and well.  A grandmother had breast cancer.  REVIEW OF SYSTEMS:  Head:  Negative.  ENT:  Negative.  Bones, Joints, and Muscles:  Negative.  Cardiorespiratory:  Negative.  GI:  Negative.  No jaundice.  GU:  Negative, except for her present illness.  ALLERGIC HISTORY:  Negative.  PHYSICAL EXAMINATION:  Blood pressure 140/80.  Pulse, temperature, and respirations normal.  GENERAL APPEARANCE:  The patient is an alert, cooperative woman appearing approximately her stated age in no acute distress.  HEENT:  Not remarkable.  NECK:  Supple.  Thyroid not enlarged.  LUNGS:  Clear to P&A.  HEART:  Not enlarged.  Normal sinus rhythm.  No murmur.  BREASTS:  Stage IV.  Inverted nipples.  ABDOMEN:  Protuberant.  There is a mass that is very firm in the pelvis.  It comes up to the level of the umbilicus.  Nontender and nonmoveable.  A few shotty inguinal nodes are palpable.  EXTREMITIES:  Negative.  Peripheral pulses palpable.  Reflexes physiologic.  BACK:  Spine negative.  No CVA tenderness.  PELVIC:  External genitalia negative.  Hair, skin, BUS glands, clitoris,  and urethra are not remarkable.  The patient does not relax well.  It is a little hard to examine her.  The vagina is clean and healthy looking.  Because of the distortion of the large uterine mass, the cervix is very difficult to visualize.  It can be felt directed somewhat posteriorly.  This mass completely fills the pelvis.  It is hard to outline it.  It is certainly more into the left lower quadrant and the left iliac fossa, but it is very firm and very fixed.  No other masses are palpable.  The rectovaginal examination confirms.  IMPRESSION:  Large pelvic mass, fibromyoma uteri, with pressure on some of her blood vessels.  The patient is admitted for the purpose of a total abdominal hysterectomy.  The risks have been explained to her and she understands. Dictated by:   Darcella Cheshire, M.D. Attending:  Darcella Cheshire, M.D. DD:  07/19/01 TD:  07/19/01 Job: [id number]

## 2011-01-29 NOTE — Op Note (Signed)
[facility]  Patient:    Jessica Yates, Jessica Yates [facility] Visit Number: [id number] MRN: [id number]          Service Type: GYN Location: 4W 0460 02 Attending Physician:  Lanna Poche Dictated by:   Darcella Cheshire, M.D. Proc. 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  POSTOPERATIVE DIAGNOSIS:  Large fibromyoma of the uteri with bleeding, anemia and pelvic pressure on vital blood vessels.  OPERATION:  Total abdominal hysterectomy.  SURGEON:  Darcella Cheshire, M.D.  DESCRIPTION OF PROCEDURE:  After satisfactory general anesthesia, the patient was placed on the table in the lithotomy position.  The perineum and vagina were prepped with Betadine solution and draped in the usual manner.  The] bladder was catheterized using a Foley catheter with a 5 cc bag inflated and left indwelling.  The abdomen was prepped and draped for a midline incision. Such an incision was made, carried through in the usual fashion.  Entry into the abdominal cavity was not instant. Encountered upon entering the peritoneal cavity was a large tumor.  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  In fact, it was so well fixed that it was hard to remove.  Finally, a tenaculum was placed on the uterus and with some effort removed from the pelvis.  There was a large myoma, approximately 8-10 cm in diameter, that was wedged down into the left iliac fossa.  Once all of this was freed up and removed, it could be brought out of the pelvis.  Still it was somewhat restricted because of the way this tumor had grown.  The uteroovarian ligaments were clamped, cut and tied on either side to free it up even more and thereafter the uterus was removed using the clamp, cut, tie technique.  Then #1 chromic catgut was used throughout.  The dissection  was carried down to the cervix.  The Heaney clamps were placed across the upper vagina and uterus and multiple myomas removed from the operative field.  There were several pedunculated myomas all external of this and one of them had been dislodged in the removal of the tumor from the abdomen and this was retrieved and placed with the specimen.  Attention was then turned to that lateral fossa which the major pressure had been on the left leg and indeed there were tremendous varicosities in this area.  However, there was no erosion and no further treatment was felt necessary in this area. The peritoneum was then reperitonealized using 2-0 plain catgut suture after closure of the upper vagina.  Hemostasis now was very good and it had been oozing throughout the procedure and this, however, was controlled well with removal of the uterus.  The area was reperitonealized.  The large valve was placed down into the cul-de-sac.  Palpation of the upper abdomen was now possible and both kidneys felt normal.  The liver was smooth.  The gallbladder was distended but smooth.  The patient tolerated the procedure well.  Blood loss was estimated by me and anesthesia as being 500 cc.  The patient was sent to the recovery room in good condition. Dictated by:   Darcella Cheshire, M.D. Attending Physician:  Lanna Poche DD:  07/19/01 TD:  07/21/01  Job: [id number]

## 2011-01-29 NOTE — Assessment & Plan Note (Signed)
[facility]                               PULMONARY OFFICE NOTE   Jessica Yates, Jessica Yates               MRN:          [id number]  DATE:05/20/2006                            DOB:          Jul 04, 1951    PULMONARY FOLLOWUP:   PROBLEM:  1. Asthma.  2. Cough.  3. Allergic rhinitis.  4. Peripheral venous insufficiency.  5. Deep vein thrombosis left leg 2002.  6. Sickle-cell trait.   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 She  has a long history of peripheral venous insufficiency and had a deep vein  thrombosis in 2002 after which she had total abdominal hysterectomy for  fibroids.  She is described as having had a venous insufficiency of the left  lower lobe.  Office visit from 2002 her chest film was said to be clear.  Spirometry in 2005 demonstrated mild reversible obstructive airways disease.  She was treated for a while with Advair 100/50 and a rescue albuterol  inhaler.  She now denies reflux symptoms but she expressed concern that she  had been coughing for a while at mid-summer, although that seems now  improved.  She has symptoms of tuberculosis but to her knowledge has never  been exposed.  She had little sputum.  No chest pain on palpitation.  No  blood palpation or syncope.  Her legs are always heavy, left worse than  right.  She has not been bothering with elastic hose.   MEDICATIONS:  1. Fish oil.  2. Ibuprofen.   ALLERGIES:  No drug allergy.   OBJECTIVE:  VITAL SIGNS:  Weight 182 pounds.  Blood pressure 106/70.  Pulse  regular 78.  Room air saturation 99%.  GENERAL:  Pleasant woman, quite articulate, in no distress.  Able to speak  in long sentences.  CHEST:  There was a mild dry cough with no wheeze, dullness or rhonchi.  HEART:  Heart sounds were regular without murmur or gallop.  HEENT:  Nose and throat were clear with  no neck vein distention or strider.  EXTREMITIES:  Heavy legs with edema, left greater than right.  No palpable  cords.   IMPRESSION:  Cough non-specific but suggestive of her asthma during the  summer air quality months.  I have cautioned her to watch for symptoms  suggesting reflux or recurrent pulmonary embolism.  Additional problems of  allergic rhinitis and peripheral venous disease.   PLAN:  1. Chest x-ray.  2. Pulmonary function tests.  3. Sample of albuterol for trial if cough develops again, 2 puffs q.i.d.      as needed.  4. Schedule return in six weeks, earlier as needed.                                   Clinton D. Maple Hudson, MD, FCCP, FACP   CDY/MedQ  DD:  05/20/2006  DT:  05/21/2006  Job #:  [id number]

## 2011-01-29 NOTE — Assessment & Plan Note (Signed)
[facility]                             PULMONARY OFFICE NOTE   Jessica Yates, Jessica Yates               MRN:          [id number]  DATE:08/19/2006                            DOB:          06-Dec-1950    PROBLEMS:  1. Asthma with cough.  2. Allergic rhinitis.  3. Peripheral venous insufficiency.  4. Deep vein thrombosis left leg 2002.  5. Sickle cell trait.   HISTORY:  She returns for followup after initial visit in September.  There has been some increased cough with the onset of indoor heating,  but no wheeze, no increased dyspnea, no chest pain.  She did not get  pulmonary function tests done.   MEDICATIONS:  Fish oil, ibuprofen, occasional use of a Proventil rescue  inhaler.   ALLERGIES:  No medication allergy.   OBJECTIVE:  Weight 180 pounds, BP 146/78, pulse regular 118, room air  saturation 99%.  A little watery sniffing but not congested, no visible postnasal drip.  Pharynx is clear.  LUNGS:  Distinctly clear with good air flow.  HEART:  Sounds are a little rapid but regular.  No murmur heard.  Legs are heavy with edema left greater than right, as before.  No  palpable cords.   Chest x-ray from May 20, 2006, showed chronic changes, no active  disease.  Heart and blood vessels were normal.  Mild peribronchial  thickening consistent with her asthma, mild bi-apical pleural  thickening, no active process.   IMPRESSION:  1. Asthma.  2. 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   PLAN:  1. Continue anti-stasis efforts.  2. Try Symbicort 160/4.5 with discussion.  3. Continue p.r.n. albuterol.  4. Schedule return in 6 weeks, earlier p.r.n.  She will still need to      get a PFT.     Clinton D. Maple Hudson, MD, Tonny Bollman, FACP  Electronically Signed    CDY/MedQ  DD: 08/20/2006  DT: 08/20/2006  Job #: [id number]

## 2011-01-29 NOTE — Assessment & Plan Note (Signed)
[facility]                             PULMONARY OFFICE NOTE   Jessica Yates, Jessica Yates               MRN:          [id number]  DATE:11/11/2006                            DOB:          [birth year]    PROBLEMS:  1. Asthma.  2. Cough.  3. Allergic rhinitis.  4. Peripheral venous insufficiency.  5. Deep venous thrombosis, left leg 2002.  6. Sickle cell trait.   HISTORY:  Two days of increasing nasal congestion, sniffing, and now has  developed streak epistaxis.  No headache, nothing purulent, no sore  throat, minor tightness which she feels in the right upper chest.  Frequent cough.  She has not paid attention or noticed wheezing.   MEDICATIONS:  1. Fish oil.  2. Detrol LA.  3. P.r.n. use of a Proventil rescue inhaler.   NO MEDICATION ALLERGY.   OBJECTIVE:  Weight 187 pounds, blood pressure 128/70, pulse regular 101,  room air saturation 98%.  Edematous turbinates and persistent sniffing  with no visible postnasal drainage.  CHEST:  Sounds clear.  HEART:  Sounds are normal.  There is no neck vein distention, no adenopathy.  Heavy legs without  distinct edema today.   IMPRESSION:  1. Mild asthma with chest tightness.  2. Rhinitis, possibly early seasonal rhinitis versus viral upper      respiratory infection.   PLAN:  1. Tessalon perles 100 mg q.6.h. p.r.n. cough.  2. Over the counter antihistamine, either Zyrtec or Claritin p.r.n.      for trial.  3. Sample Nasonex, 2 sprays each nostril daily.  4. We spent about 15 minutes trying to help her understand the purpose      of maintenance as      opposed to rescue inhalers so that she would try again with      Symbicort 160/4.5 which she      had used only a few times and p.r.n. use of her rescue albuterol      inhaler.  5. Schedule return 4 weeks, earlier p.r.n.     Clinton D. Maple Hudson, MD, Tonny Bollman, FACP  Electronically Signed    CDY/MedQ  DD: 11/12/2006  DT: 11/12/2006  Job #:  [id number]

## 2011-03-26 ENCOUNTER — Other Ambulatory Visit: Payer: Self-pay | Admitting: Emergency Medicine

## 2011-03-26 ENCOUNTER — Emergency Department (HOSPITAL_COMMUNITY)
Admission: EM | Admit: 2011-03-26 | Discharge: 2011-03-27 | Disposition: A | Discharge status: Home / Self Care | Payer: BC Managed Care – PPO | Attending: Emergency Medicine | Admitting: Emergency Medicine

## 2011-03-26 ENCOUNTER — Inpatient Hospital Stay (INDEPENDENT_AMBULATORY_CARE_PROVIDER_SITE_OTHER)
Admission: RE | Admit: 2011-03-26 | Discharge: 2011-03-26 | Disposition: A | Discharge status: Home / Self Care | Payer: BC Managed Care – PPO | Source: Ambulatory Visit | Attending: Emergency Medicine | Admitting: Emergency Medicine

## 2011-03-26 DIAGNOSIS — R918 Other nonspecific abnormal finding of lung field: Secondary | ICD-10-CM | POA: Insufficient documentation

## 2011-03-26 DIAGNOSIS — R51 Headache: Secondary | ICD-10-CM | POA: Insufficient documentation

## 2011-03-26 DIAGNOSIS — R059 Cough, unspecified: Secondary | ICD-10-CM | POA: Insufficient documentation

## 2011-03-26 DIAGNOSIS — M542 Cervicalgia: Secondary | ICD-10-CM | POA: Insufficient documentation

## 2011-03-26 DIAGNOSIS — H409 Unspecified glaucoma: Secondary | ICD-10-CM | POA: Insufficient documentation

## 2011-03-26 DIAGNOSIS — R509 Fever, unspecified: Secondary | ICD-10-CM

## 2011-03-26 DIAGNOSIS — R05 Cough: Secondary | ICD-10-CM | POA: Insufficient documentation

## 2011-03-26 LAB — DIFFERENTIAL
Basophils Absolute: 0 10*3/uL (ref 0.0–0.1)
Eosinophils Absolute: 0.1 10*3/uL (ref 0.0–0.7)
Eosinophils Relative: 1 % (ref 0–5)

## 2011-03-26 LAB — CBC
MCV: 80.8 fL (ref 78.0–100.0)
Platelets: 195 10*3/uL (ref 150–400)
RDW: 15.2 % (ref 11.5–15.5)
WBC: 10.9 10*3/uL — ABNORMAL HIGH (ref 4.0–10.5)

## 2011-03-27 ENCOUNTER — Emergency Department (HOSPITAL_COMMUNITY): Payer: BC Managed Care – PPO

## 2011-03-27 LAB — CSF CELL COUNT WITH DIFFERENTIAL: Eosinophils, CSF: 0 % (ref 0–1)

## 2011-03-27 LAB — GLUCOSE, CSF: Glucose, CSF: 82 mg/dL — ABNORMAL HIGH (ref 43–76)

## 2011-03-29 LAB — DIFFERENTIAL
Basophils Absolute: 0 10*3/uL (ref 0.0–0.1)
Basophils Relative: 0 % (ref 0–1)
Eosinophils Absolute: 0.1 10*3/uL (ref 0.0–0.7)
Eosinophils Relative: 1 % (ref 0–5)
Lymphocytes Relative: 13 % (ref 12–46)
Lymphs Abs: 1.3 10*3/uL (ref 0.7–4.0)
Monocytes Absolute: 0.9 10*3/uL (ref 0.1–1.0)
Monocytes Relative: 9 % (ref 3–12)
Neutro Abs: 7.8 10*3/uL — ABNORMAL HIGH (ref 1.7–7.7)
Neutrophils Relative %: 77 % (ref 43–77)

## 2011-03-29 LAB — CBC
HCT: 31.5 % — ABNORMAL LOW (ref 36.0–46.0)
Hemoglobin: 10.7 g/dL — ABNORMAL LOW (ref 12.0–15.0)
MCH: 27.3 pg (ref 26.0–34.0)
MCHC: 34 g/dL (ref 30.0–36.0)
MCV: 80.4 fL (ref 78.0–100.0)
Platelets: 187 10*3/uL (ref 150–400)
RBC: 3.92 MIL/uL (ref 3.87–5.11)
RDW: 15.1 % (ref 11.5–15.5)
WBC: 10.1 10*3/uL (ref 4.0–10.5)

## 2011-03-29 LAB — BASIC METABOLIC PANEL
BUN: 10 mg/dL (ref 6–23)
CO2: 28 mEq/L (ref 19–32)
Calcium: 10.5 mg/dL (ref 8.4–10.5)
Chloride: 104 mEq/L (ref 96–112)
Creatinine, Ser: 0.86 mg/dL (ref 0.50–1.10)
GFR calc Af Amer: >60 mL/min (ref >60)
GFR calc non Af Amer: >60 mL/min (ref >60)
Glucose, Bld: 123 mg/dL — ABNORMAL HIGH (ref 70–99)
Potassium: 3.9 mEq/L (ref 3.5–5.1)
Sodium: 139 mEq/L (ref 135–145)

## 2011-03-30 LAB — CSF CULTURE W GRAM STAIN

## 2011-04-16 ENCOUNTER — Other Ambulatory Visit (INDEPENDENT_AMBULATORY_CARE_PROVIDER_SITE_OTHER): Payer: BC Managed Care – PPO

## 2011-04-16 ENCOUNTER — Encounter: Payer: Self-pay | Admitting: Internal Medicine

## 2011-04-16 ENCOUNTER — Ambulatory Visit (INDEPENDENT_AMBULATORY_CARE_PROVIDER_SITE_OTHER): Payer: BC Managed Care – PPO | Admitting: Internal Medicine

## 2011-04-16 VITALS — BP 122/66 | HR 98 | Ht 68.0 in | Wt 203.0 lb

## 2011-04-16 DIAGNOSIS — J45909 Unspecified asthma, uncomplicated: Secondary | ICD-10-CM

## 2011-04-16 DIAGNOSIS — I509 Heart failure, unspecified: Secondary | ICD-10-CM

## 2011-04-16 DIAGNOSIS — R222 Localized swelling, mass and lump, trunk: Secondary | ICD-10-CM

## 2011-04-16 DIAGNOSIS — R918 Other nonspecific abnormal finding of lung field: Secondary | ICD-10-CM | POA: Insufficient documentation

## 2011-04-16 LAB — BASIC METABOLIC PANEL
BUN: 11 mg/dL (ref 6–23)
Chloride: 109 mEq/L (ref 96–112)
Creatinine, Ser: 1 mg/dL (ref 0.4–1.2)
GFR: 75.2 mL/min (ref >60.00)
Glucose, Bld: 97 mg/dL (ref 70–99)
Potassium: 4.1 mEq/L (ref 3.5–5.1)

## 2011-04-16 MED ORDER — BENZONATATE 200 MG PO CAPS: 200.0000 mg | ORAL_CAPSULE | Freq: Three times a day (TID) | ORAL | Status: AC | PRN

## 2011-04-16 NOTE — Progress Notes (Signed)
Subjective:    Patient ID: Jessica Yates, female    DOB: July 28, 1951, 60 y.o.   MRN: [id number]  HPI 04/16/11- 60 year old female never smoker previously followed in this office for asthma and cough, allergic rhinitis, complicated by a history of peripheral venous insufficiency, deep vein thrombosis in the left leg in 2002, sickle cell trait and glaucoma. There was no history of pulmonary embolism. On 03/27/2011 she went to the emergency room because of neck and back pain. Workup included chest x-ray. This was significant for a mildly prominent rounded soft tissue density in the right superior hilar region at the level of the azygos vein. Radiologist differential was between vascular shadow or mass. CT scan was recommended for evaluation. Additionally were seen mild changes of COPD, borderline cardiomegaly, mild pulmonary vascular congestion. She has been noting some persistent dry cough without wheeze or chest pain. She has not noted fever adenopathy or palpitation. She has a history of chronic lymphedema of the legs and a similar family history in her mother. In 2002 she was treated for deep vein thrombosis in the left lower leg, without pulmonary embolism. She remembers taking heparin and then Coumadin.  Review of Systems Constitutional:   No-   weight loss, night sweats, fevers, chills, fatigue, lassitude. HEENT:    +  headaches, N o-difficulty swallowing, tooth/dental problems, sore throat,                  No-   sneezing, itching, ear ache, nasal congestion, post nasal drip,  CV:  No-   chest pain, orthopnea, PND,                + swelling in lower extremities,  No- anasarca, dizziness, palpitations GI:  No-   heartburn, indigestion, abdominal pain, nausea, vomiting, diarrhea,                 change in bowel habits, loss of appetite Resp: No-   shortness of breath with exertion or at rest.  No-  excess mucus,             No-   productive cough,             + non-productive cough,               No-  coughing up of blood.              No-   change in color of mucus.  No- wheezing.   Skin: No-   rash or lesions. GU: No-   dysuria, change in color of urine, no urgency or frequency.  No- flank pain. MS:  No-   joint pain or swelling.  No- decreased range of motion.  + back pain. Psych:  No- change in mood or affect. No depression or anxiety.  No memory loss.     Objective:   Physical Exam General- Alert, Oriented, Affect-appropriate, Distress- none acute. Overweight, comfortable appearing.   Skin- rash-none, lesions- none, excoriation- none Lymphadenopathy- none Head- atraumatic            Eyes- Gross vision intact, PERRLA, conjunctivae clear secretions            Ears- Hearing, canals normal            Nose- Clear, No-Septal dev, mucus, polyps, erosion, perforation             Throat- Mallampati II , mucosa clear , drainage- none, tonsils- atrophic    dentures Neck- flexible ,  trachea midline, no stridor , thyroid nl, carotid no bruit Chest - symmetrical excursion , unlabored           Heart/CV- RRR , no murmur , no gallop  , no rub, nl s1 s2                           - JVD- none , edema- none, stasis changes- none, varices- none           Lung- clear to P&A, wheeze- none, cough- none , dullness-none, rub- none           Chest wall-  Abd- tender-no, distended-no, bowel sounds-present, HSM- no Br/ Gen/ Rectal- Not done, not indicated Extrem- cyanosis- none, clubbing, none, atrophy- none, strength- nl. Heavy legs with 1+ edema Neuro- grossly intact to observation         Assessment & Plan:

## 2011-04-16 NOTE — Assessment & Plan Note (Signed)
Complaint now of dry cough which is nonspecific. It might be related to the hilar shadow on recent chest x-ray or to cough equivalent asthma. Pending further workup, I am giving her benzonatate for trial.

## 2011-04-16 NOTE — Patient Instructions (Addendum)
Order- schedule CT chest with contrast for dx hilar mass            Lab  BMET  For dx CHF                    BNP  Script benzonatate 200 1 tid prn cough,

## 2011-04-16 NOTE — Progress Notes (Signed)
  Subjective:    Patient ID: Jessica Yates, female    DOB: August 03, 1951, 60 y.o.   MRN: [id number]  HPI    Review of Systems  Constitutional: Positive for unexpected weight change.  HENT: Positive for congestion and sneezing.   Respiratory: Positive for cough.   Musculoskeletal: Positive for myalgias.  Skin: Positive for rash.       itching  Neurological: Positive for headaches.  Psychiatric/Behavioral:       Anxiety       Objective:   Physical Exam        Assessment & Plan:

## 2011-04-16 NOTE — Assessment & Plan Note (Addendum)
CXR shows hilar mass, very nonspecific. There is also underlying suggestion of vascular congestion and COPD. She has has hx of asthma- mild and not recent, of DVT w/o PE, and there is suggestion of fluid overload., w/o hx of heart disease. We will get CTa, BMET for contrast, and BNP.

## 2011-04-20 ENCOUNTER — Telehealth: Payer: Self-pay | Admitting: Internal Medicine

## 2011-04-20 NOTE — Telephone Encounter (Signed)
BMET and BNP- normal kidney function and BNP heart test- ok for CT scan.  Spoke with pt and made her aware of these results and [facility] will be contacting her to set up ct scan. Pt verbalized understanding

## 2011-04-21 ENCOUNTER — Ambulatory Visit (INDEPENDENT_AMBULATORY_CARE_PROVIDER_SITE_OTHER)
Admission: RE | Admit: 2011-04-21 | Discharge: 2011-04-21 | Disposition: A | Discharge status: Home / Self Care | Payer: BC Managed Care – PPO | Source: Ambulatory Visit | Attending: Internal Medicine | Admitting: Internal Medicine

## 2011-04-21 DIAGNOSIS — R222 Localized swelling, mass and lump, trunk: Secondary | ICD-10-CM

## 2011-04-21 DIAGNOSIS — R918 Other nonspecific abnormal finding of lung field: Secondary | ICD-10-CM

## 2011-04-21 MED ORDER — IOHEXOL 300 MG/ML  SOLN
80.0000 mL | Freq: Once | INTRAMUSCULAR | Status: AC | PRN
  Administered 2011-04-21: 80 mL via INTRAVENOUS

## 2011-04-26 NOTE — Progress Notes (Signed)
Quick Note:  Pt aware of results. Appt on 05-18-11 at 245pm. ______

## 2011-04-27 ENCOUNTER — Ambulatory Visit: Payer: BC Managed Care – PPO | Admitting: Internal Medicine

## 2011-04-29 ENCOUNTER — Inpatient Hospital Stay (INDEPENDENT_AMBULATORY_CARE_PROVIDER_SITE_OTHER)
Admission: RE | Admit: 2011-04-29 | Discharge: 2011-04-29 | Disposition: A | Discharge status: Home / Self Care | Payer: BC Managed Care – PPO | Source: Ambulatory Visit | Attending: Emergency Medicine | Admitting: Emergency Medicine

## 2011-04-29 DIAGNOSIS — I824Y9 Acute embolism and thrombosis of unspecified deep veins of unspecified proximal lower extremity: Secondary | ICD-10-CM

## 2011-04-29 LAB — DIFFERENTIAL
Basophils Absolute: 0 10*3/uL (ref 0.0–0.1)
Eosinophils Relative: 2 % (ref 0–5)
Lymphocytes Relative: 18 % (ref 12–46)
Neutro Abs: 8.4 10*3/uL — ABNORMAL HIGH (ref 1.7–7.7)

## 2011-04-29 LAB — CBC
HCT: 33.7 % — ABNORMAL LOW (ref 36.0–46.0)
Hemoglobin: 11.3 g/dL — ABNORMAL LOW (ref 12.0–15.0)
RDW: 15.4 % (ref 11.5–15.5)
WBC: 10.9 10*3/uL — ABNORMAL HIGH (ref 4.0–10.5)

## 2011-04-29 LAB — BASIC METABOLIC PANEL
GFR calc non Af Amer: >60 mL/min (ref >60)
Glucose, Bld: 85 mg/dL (ref 70–99)
Potassium: 3.7 mEq/L (ref 3.5–5.1)
Sodium: 140 mEq/L (ref 135–145)

## 2011-04-29 LAB — PRO B NATRIURETIC PEPTIDE: Pro B Natriuretic peptide (BNP): 71.8 pg/mL (ref 0–125)

## 2011-04-30 ENCOUNTER — Ambulatory Visit (HOSPITAL_COMMUNITY)
Admission: RE | Admit: 2011-04-30 | Discharge: 2011-04-30 | Disposition: A | Discharge status: Home / Self Care | Payer: BC Managed Care – PPO | Source: Ambulatory Visit | Attending: Emergency Medicine | Admitting: Emergency Medicine

## 2011-04-30 DIAGNOSIS — M79609 Pain in unspecified limb: Secondary | ICD-10-CM | POA: Insufficient documentation

## 2011-04-30 DIAGNOSIS — M7989 Other specified soft tissue disorders: Secondary | ICD-10-CM | POA: Insufficient documentation

## 2011-05-04 ENCOUNTER — Ambulatory Visit: Payer: BC Managed Care – PPO | Admitting: Internal Medicine

## 2011-05-06 ENCOUNTER — Ambulatory Visit: Payer: BC Managed Care – PPO | Admitting: Internal Medicine

## 2011-05-11 ENCOUNTER — Institutional Professional Consult (permissible substitution): Payer: BC Managed Care – PPO | Admitting: Internal Medicine

## 2011-05-11 ENCOUNTER — Encounter: Payer: Self-pay | Admitting: Vascular Surgery

## 2011-05-12 ENCOUNTER — Encounter: Payer: Self-pay | Admitting: Vascular Surgery

## 2011-05-12 ENCOUNTER — Ambulatory Visit (INDEPENDENT_AMBULATORY_CARE_PROVIDER_SITE_OTHER): Payer: BC Managed Care – PPO | Admitting: Vascular Surgery

## 2011-05-12 VITALS — BP 137/80 | HR 103 | Resp 20 | Ht 68.0 in | Wt 199.7 lb

## 2011-05-12 DIAGNOSIS — I898 Other specified noninfective disorders of lymphatic vessels and lymph nodes: Secondary | ICD-10-CM

## 2011-05-12 NOTE — Progress Notes (Signed)
Addended by: Sharee Pimple on: 05/12/2011 10:29 AM   Modules accepted: Orders

## 2011-05-12 NOTE — Progress Notes (Signed)
Subjective:     Patient ID: Jessica Yates, female   DOB: [birth year], 60 y.o.   MRN: [id number]  HPI  This is a 61 year old woman who I been following with lymphedema. I last saw her on 12/02/2010. Typically she's had more significant swelling in the left leg. However, 2 weeks ago she had significant right lower extremity swelling. She was evaluated at [facility] and underwent a venous duplex scan which showed no evidence of right lower tree DVT. He was given a prescription for Lasix for several days. He states that her swelling in the right leg has improved. She's had no significant leg pain.  Review of Systems  Constitutional: Negative for fever and chills.  Respiratory: Negative for chest tightness.   Cardiovascular: Positive for leg swelling. Negative for chest pain and palpitations.       Objective:   Physical Exam  Constitutional: She appears well-developed and well-nourished.  HENT:  Head: Normocephalic and atraumatic.  Cardiovascular: Normal rate, regular rhythm and normal heart sounds.   Pulses:      Radial pulses are 2+ on the right side, and 2+ on the left side.       Popliteal pulses are 2+ on the right side, and 2+ on the left side.       Dorsalis pedis pulses are 2+ on the right side, and 2+ on the left side.  Pulmonary/Chest: No respiratory distress. She has no wheezes. She has no rales.  Abdominal: Soft. Bowel sounds are normal. She exhibits no distension. There is no tenderness.  Musculoskeletal: Normal range of motion.   I have reviewed her venous duplex scan that was done on 04/30/2011 and this shows no evidence of right lower extremity DVT or superficial thrombophlebitis.    Assessment:     This patient has right lower extremity lymphedema in addition to her left lower extremity lymphedema. I do not see any evidence of lymphangitis. He does not have a DVT based on her duplex scan.    Plan:     We have again discussed the importance of leg elevation and I  discussed with her the proper position for this. In addition she does have a pair of compression stockings that she recently purchased. Encouraged her to wear these whenever she is on her feet for any length of time. I will see her back in one year. She knows to call sooner if she has problems.

## 2011-05-18 ENCOUNTER — Encounter: Payer: Self-pay | Admitting: Internal Medicine

## 2011-05-18 ENCOUNTER — Ambulatory Visit (INDEPENDENT_AMBULATORY_CARE_PROVIDER_SITE_OTHER): Payer: BC Managed Care – PPO | Admitting: Internal Medicine

## 2011-05-18 DIAGNOSIS — R222 Localized swelling, mass and lump, trunk: Secondary | ICD-10-CM

## 2011-05-18 DIAGNOSIS — R918 Other nonspecific abnormal finding of lung field: Secondary | ICD-10-CM

## 2011-05-18 DIAGNOSIS — I872 Venous insufficiency (chronic) (peripheral): Secondary | ICD-10-CM

## 2011-05-18 DIAGNOSIS — J45909 Unspecified asthma, uncomplicated: Secondary | ICD-10-CM

## 2011-05-18 MED ORDER — PROMETHAZINE-CODEINE 6.25-10 MG/5ML PO SYRP: 5.0000 mL | ORAL_SOLUTION | ORAL | Status: AC | PRN

## 2011-05-18 MED ORDER — BENZONATATE 200 MG PO CAPS: 200.0000 mg | ORAL_CAPSULE | Freq: Three times a day (TID) | ORAL | Status: AC | PRN

## 2011-05-18 NOTE — Assessment & Plan Note (Signed)
Minor cough, persistent. This might relate to the air in lower esophagus. I don't get hx suggesting scleroderma or obvious reflux. Will let her have cough syrup, try omeprazole Had flu vax already

## 2011-05-18 NOTE — Patient Instructions (Addendum)
For cough- try otc cough syrup Delsym  Script for cough syrup with codeine- printed   And refilled benzonatate pills  Also for cough- try for one month otc acid blocker omeprazole to see it that helps stop your cough while you are taking it.

## 2011-05-18 NOTE — Progress Notes (Signed)
Subjective:    Patient ID: Jessica Yates, female    DOB: [birth year], 60 y.o.   MRN: [id number]  HPI    Review of Systems     Objective:   Physical Exam        Assessment & Plan:   Subjective:    Patient ID: Jessica Yates, female    DOB: 02-28-1951, 60 y.o.   MRN: [id number]  HPI 04/16/11- 60 year old female never smoker previously followed in this office for asthma and cough, allergic rhinitis, complicated by a history of peripheral venous insufficiency, deep vein thrombosis in the left leg in 2002, sickle cell trait and glaucoma. There was no history of pulmonary embolism. On 03/27/2011 she went to the emergency room because of neck and back pain. Workup included chest x-ray. This was significant for a mildly prominent rounded soft tissue density in the right superior hilar region at the level of the azygos vein. Radiologist differential was between vascular shadow or mass. CT scan was recommended for evaluation. Additionally were seen mild changes of COPD, borderline cardiomegaly, mild pulmonary vascular congestion. She has been noting some persistent dry cough without wheeze or chest pain. She has not noted fever adenopathy or palpitation. She has a history of chronic lymphedema of the legs and a similar family history in her mother. In 2002 she was treated for deep vein thrombosis in the left lower leg, without pulmonary embolism. She remembers taking heparin and then Coumadin.  05/18/11- 60 year old female never smoker previously followed in this office for asthma and cough, allergic rhinitis, complicated by a history of peripheral venous insufficiency, deep vein thrombosis in the left leg in 2002, sickle cell trait and glaucoma. There was no history of pulmonary emboli. Here with sister. CT-04/16/11-NAD, No right hilar mass. Lower esophageal thickening> potential for reflux. Never much awareness of reflux. Sister describes dry cough - discussed. Benzonate may have helped a  little.  Minor sneeze. Denies wheeze or heart burn.  Review of Systems Constitutional:   No-   weight loss, night sweats, fevers, chills, fatigue, lassitude. HEENT:    +  headaches, No-difficulty swallowing, tooth/dental problems, sore throat,                  No-   sneezing, itching, ear ache, nasal congestion, post nasal drip,  CV:  No-   chest pain, orthopnea, PND,                + swelling in lower extremities,  No- anasarca, dizziness, palpitations GI:  No-   heartburn, indigestion, abdominal pain, nausea, vomiting, diarrhea,                 change in bowel habits, loss of appetite Resp: No-   shortness of breath with exertion or at rest.  No-  excess mucus,             No-   productive cough,             + non-productive cough,              No-  coughing up of blood.              No-   change in color of mucus.  No- wheezing.   Skin: No-   rash or lesions. GU: No-   dysuria, change in color of urine, no urgency or frequency.  No- flank pain. MS:  No-   joint pain, + chronic bilateral leg swelling.  No- decreased  range of motion.  + back pain. Psych:  No- change in mood or affect. No depression or anxiety.  No memory loss.     Objective:   Physical Exam General- Alert, Oriented, Affect-appropriate, Distress- none acute. Overweight, comfortable appearing.   Skin- rash-none, lesions- none, excoriation- none Lymphadenopathy- none Head- atraumatic            Eyes- Gross vision intact, PERRLA, conjunctivae clear secretions            Ears- Hearing, canals normal            Nose- Clear, No-Septal dev, mucus, polyps, erosion, perforation             Throat- Mallampati II , mucosa clear , drainage- none, tonsils- atrophic    dentures Neck- flexible , trachea midline, no stridor , thyroid nl, carotid no bruit Chest - symmetrical excursion , unlabored           Heart/CV- RRR , no murmur , no gallop  , no rub, nl s1 s2                           - JVD- none , edema- none, stasis changes-  none, varices- none           Lung- clear to P&A, wheeze- none, cough- none , dullness-none, rub- none           Chest wall-  Abd- tender-no, distended-no, bowel sounds-present, HSM- no Br/ Gen/ Rectal- Not done, not indicated Extrem- cyanosis- none, clubbing, none, atrophy- none, strength- nl. Heavy legs with 1+ edema Neuro- grossly intact to observation         Assessment & Plan:

## 2011-05-20 NOTE — Assessment & Plan Note (Signed)
CT images were reviewed personally with her and her sister.

## 2011-07-26 ENCOUNTER — Ambulatory Visit: Payer: BC Managed Care – PPO | Admitting: Internal Medicine

## 2011-08-30 ENCOUNTER — Ambulatory Visit: Payer: BC Managed Care – PPO | Admitting: Internal Medicine

## 2011-12-01 ENCOUNTER — Ambulatory Visit: Payer: BC Managed Care – PPO | Admitting: Vascular Surgery

## 2012-04-27 ENCOUNTER — Telehealth: Payer: Self-pay | Admitting: *Deleted

## 2012-04-27 NOTE — Telephone Encounter (Signed)
Try to call patient, cell phone's mailbox if full, tried to call home phone but nobody answered x 3; phone just rings, no voicemail.

## 2012-04-27 NOTE — Telephone Encounter (Signed)
Message copied by Sharee Pimple on Thu Apr 27, 2012  3:57 PM ------      Message from: Shari Prows      Created: Thu Apr 27, 2012 11:46 AM      Regarding: triage phone call       Please call patient regarding l leg swelling and redness. Just started recently per patient. Her home ph# is [phone] and cell ph# [phone]. I did move her appt up to next week 05/03/12. Thanks, Drinda Butts

## 2012-05-01 ENCOUNTER — Telehealth: Payer: Self-pay | Admitting: *Deleted

## 2012-05-01 NOTE — Telephone Encounter (Signed)
Message copied by Sharee Pimple on Mon May 01, 2012 12:48 PM ------      Message from: Marcellus Scott      Created: Mon May 01, 2012 11:45 AM      Contact: [phone]       Jessica Yates, Jessica Yates DOB: Aug 06, 1951 called re her surgical stockings. She had a question to ask about them.Try the number above first. If she does not answer there then call [phone].            Thanks      Revonda Standard

## 2012-05-01 NOTE — Telephone Encounter (Signed)
Jessica Yates states she has history of lymphedema and has noticed that for 1 week her legs are more swollen than usual.  She is inquiring whether or not to wear compression hose with increased swelling.  Encouraged her to put compression hose on first thing in the mornings before swelling begins and to wear them until bedtime.  Encouraged her to elevate legs when sitting.  Jessica Yates has an appointment with Dr. Edilia Bo 05-03-2012 for follow up.

## 2012-05-01 NOTE — Telephone Encounter (Signed)
Routed this call to Domenic Moras, RN. ( she talked to patient last week)

## 2012-05-02 ENCOUNTER — Encounter: Payer: Self-pay | Admitting: Vascular Surgery

## 2012-05-03 ENCOUNTER — Encounter: Payer: Self-pay | Admitting: Vascular Surgery

## 2012-05-03 ENCOUNTER — Ambulatory Visit (INDEPENDENT_AMBULATORY_CARE_PROVIDER_SITE_OTHER): Payer: BC Managed Care – PPO | Admitting: Vascular Surgery

## 2012-05-03 VITALS — BP 134/87 | HR 92 | Resp 16 | Ht 68.0 in | Wt 211.0 lb

## 2012-05-03 DIAGNOSIS — I890 Lymphedema, not elsewhere classified: Secondary | ICD-10-CM

## 2012-05-03 DIAGNOSIS — I898 Other specified noninfective disorders of lymphatic vessels and lymph nodes: Secondary | ICD-10-CM | POA: Insufficient documentation

## 2012-05-03 NOTE — Assessment & Plan Note (Signed)
This patient has a long history of bilateral lower extremity lymphedema, more significantly on the left side. She had a venous duplex study back in 2009 which showed no evidence of chronic venous insufficiency. We have again had a long discussion about the importance of intermittent leg elevation and the proper positioning for this. I have also given her a prescription for knee-high compression stockings with a gradient of 30-40 mm of mercury. I plan on seeing her back in one year. She knows to call sooner if she has problems. In addition given that she has developed some hyperpigmentation on the left side possibly consistent with chronic venous insufficiency, we will repeat a formal venous reflux study when she returns in one year.

## 2012-05-03 NOTE — Addendum Note (Signed)
Addended by: Sharee Pimple on: 05/03/2012 02:58 PM   Modules accepted: Orders

## 2012-05-03 NOTE — Progress Notes (Signed)
[facility] [place]  Patient name: Jessica Yates MRN: [id number] DOB: 04-30-1951 Sex: female  REASON FOR VISIT: bilateral lower extremity lymphedema  HPI: Jessica Yates is a 61 y.o. female with a long history of bilateral lower extremity lymphedema. I last saw her in August of 2012. She comes in for a routine follow up visit. Recently she has had some increased swelling in the left leg. This has been gradually she's had no acute change in her swelling. She has not been elevating her legs as much as she normally does. She denies any previous history of DVT or phlebitis. She's had no pleuritic chest pain.   REVIEW OF SYSTEMS: Arly.Keller ] denotes positive finding; [  ] denotes negative finding  CARDIOVASCULAR:  [ ]  chest pain   [ ]  dyspnea on exertion    CONSTITUTIONAL:  [ ]  fever   [ ]  chills  PHYSICAL EXAM: Filed Vitals:   05/03/12 1040  BP: 134/87  Pulse: 92  Resp: 16  Height: 5\' 8"  (1.727 m)  Weight: 211 lb (95.709 kg)  SpO2: 100%   Body mass index is 32.08 kg/(m^2). GENERAL: The patient is a well-nourished female, in no acute distress. The vital signs are documented above. CARDIOVASCULAR: There is a regular rate and rhythm  PULMONARY: There is good air exchange bilaterally without wheezing or rales. She has significant bilateral lower extremity lymphedema extends up to her groins bilaterally. This is a more significant on the left side. She has palpable up until and dorsalis pedis pulses bilaterally.  MEDICAL ISSUES:  Lymphedema This patient has a long history of bilateral lower extremity lymphedema, more significantly on the left side. She had a venous duplex study back in 2009 which showed no evidence of chronic venous insufficiency. We have again had a long discussion about the importance of intermittent leg elevation and the proper positioning for this. I have also given her a prescription for knee-high compression stockings with a gradient of 30-40 mm of  mercury. I plan on seeing her back in one year. She knows to call sooner if she has problems. In addition given that she has developed some hyperpigmentation on the left side possibly consistent with chronic venous insufficiency, we will repeat a formal venous reflux study when she returns in one year.   Christina Gintz S [facility] [place] Beeper: [phone]

## 2012-05-17 ENCOUNTER — Ambulatory Visit: Payer: BC Managed Care – PPO | Admitting: Vascular Surgery

## 2012-05-22 IMAGING — CT CT HEAD W/O CM
1 series · 16 of 30 positions shown, 20 images · non-contrast
Comparison: None.

CLINICAL DATA: Neck pain and stiffness and fever.

CT HEAD WITHOUT CONTRAST
TECHNIQUE: Contiguous axial images were obtained from the base of
the skull through the vertex without contrast.

[Series 2: head routine 4.8 h37s · axial · 0.43mm/px · z∈[-168,-10]mm · 16 of 36 slices shown, 20 images]
[im 2/36  brain]
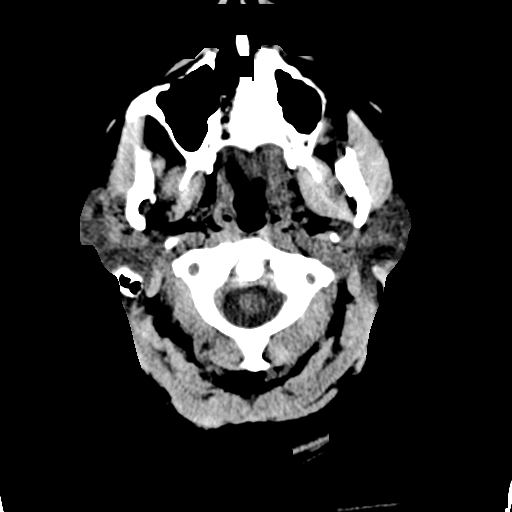
[im 2/36  bone]
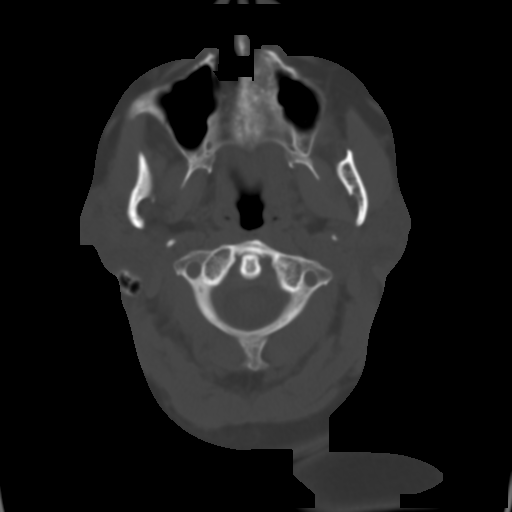
[im 4/36  brain]
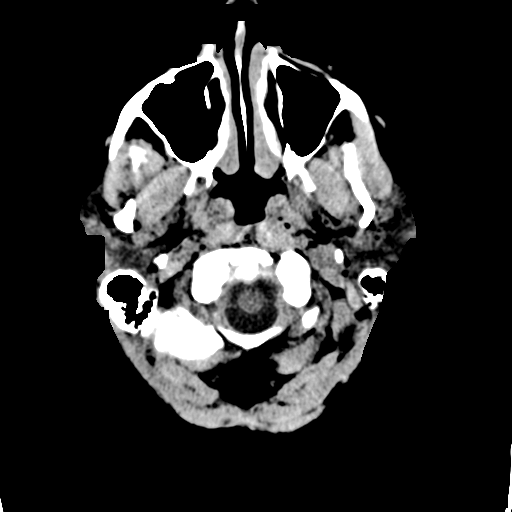
[im 7/36  brain]
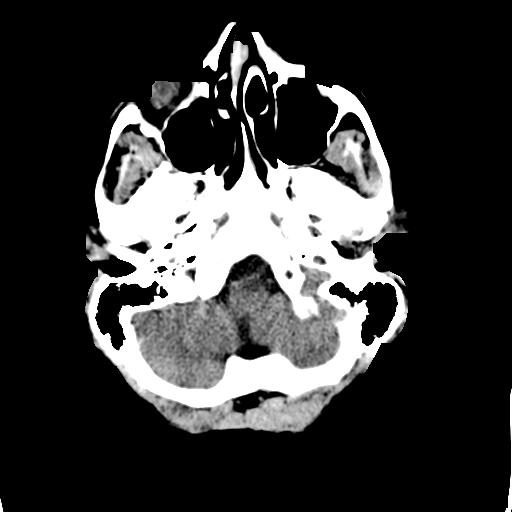
[im 9/36  brain]
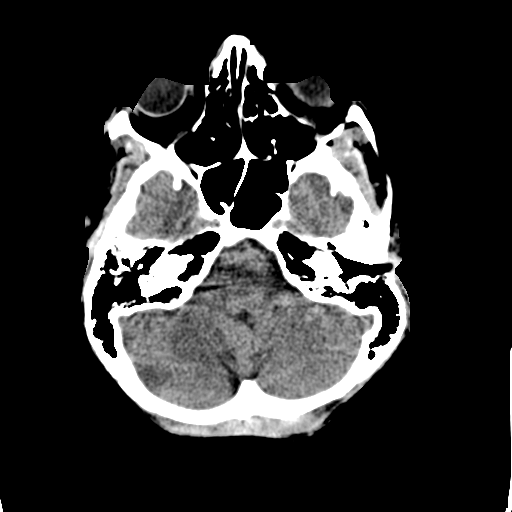
[im 10/36  brain]
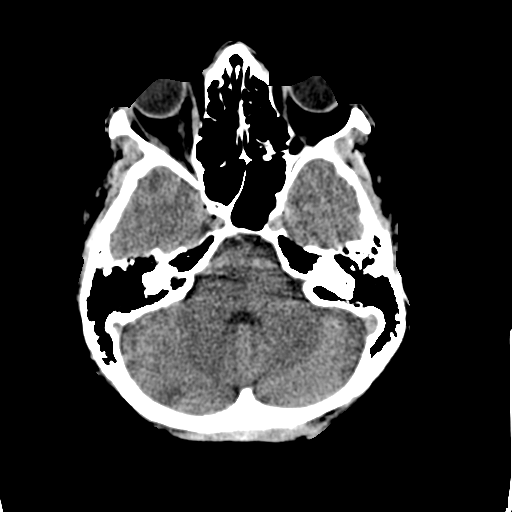
[im 10/36  bone]
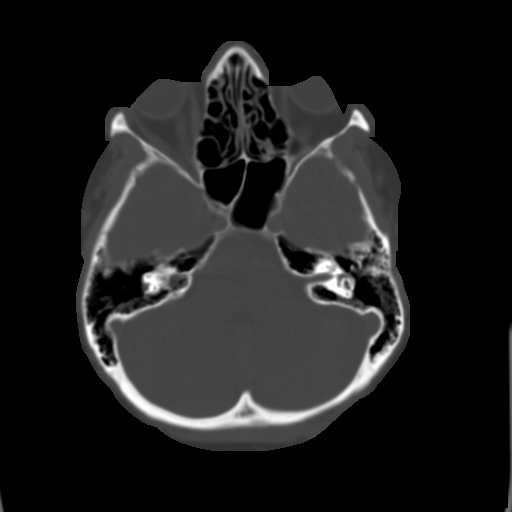
[im 13/36  brain]
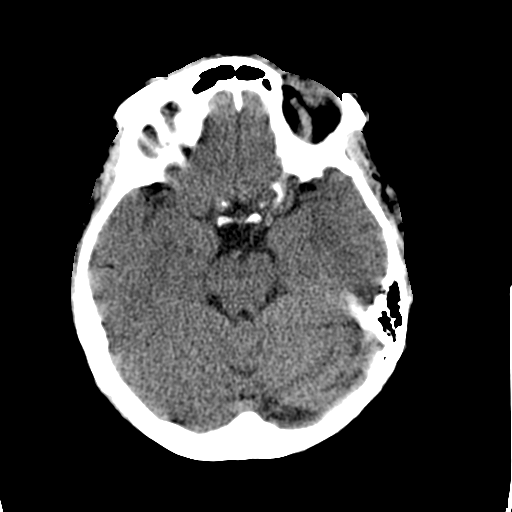
[im 15/36  brain]
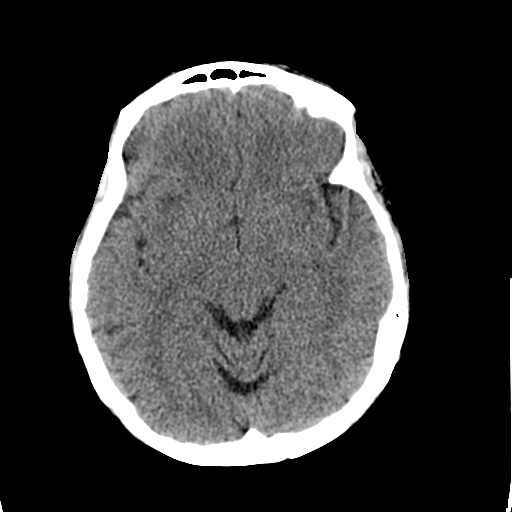
[im 17/36  brain]
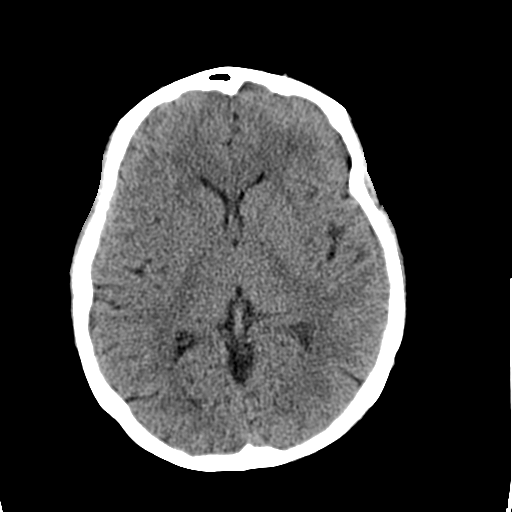
[im 19/36  brain]
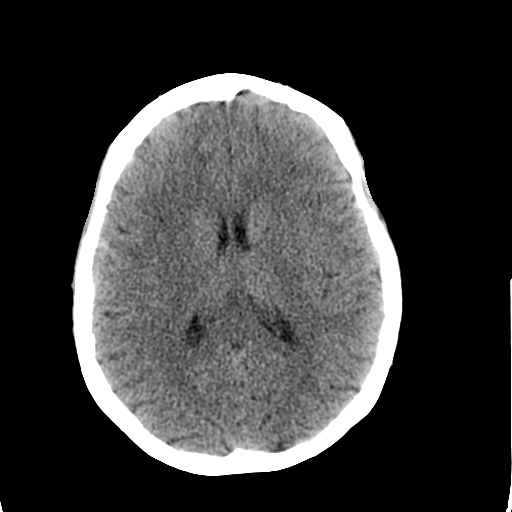
[im 19/36  bone]
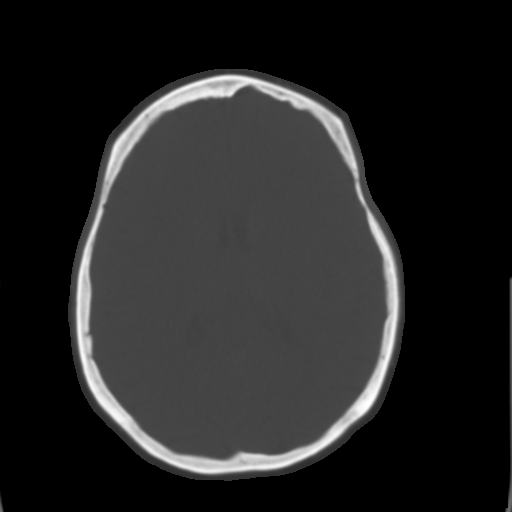
[im 21/36  brain]
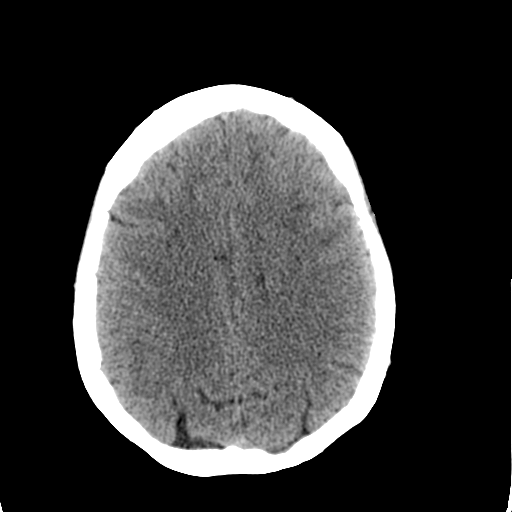
[im 23/36  brain]
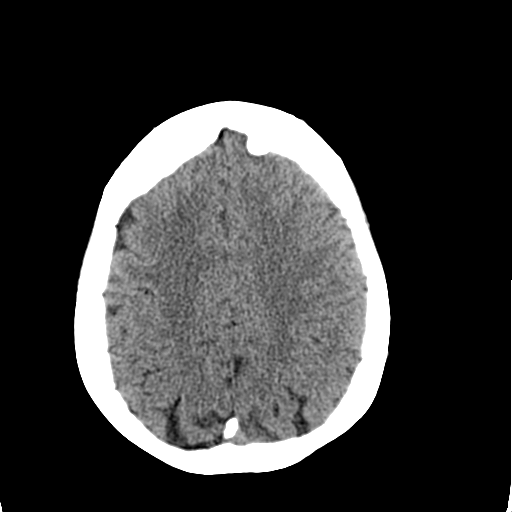
[im 26/36  brain]
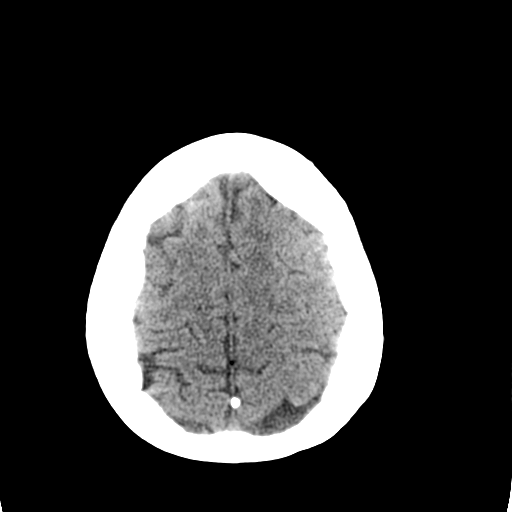
[im 27/36  brain]
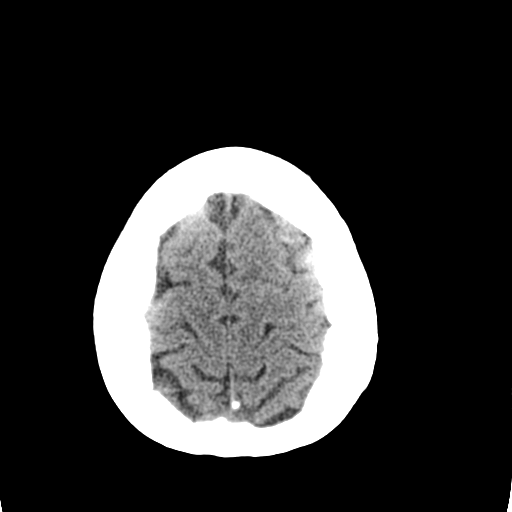
[im 27/36  bone]
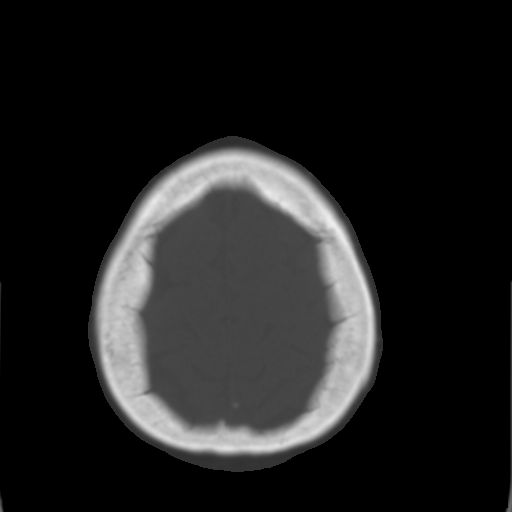
[im 29/36  brain]
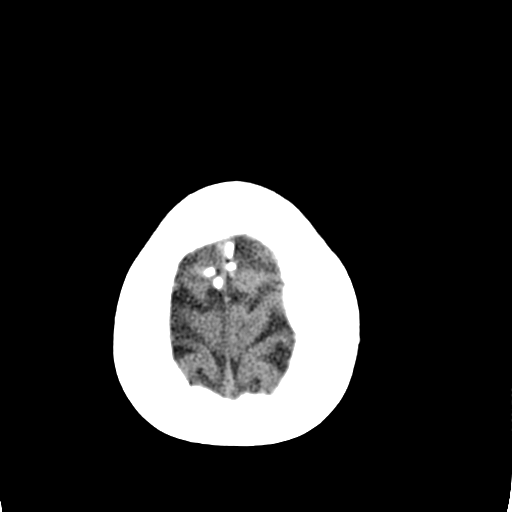
[im 32/36  brain]
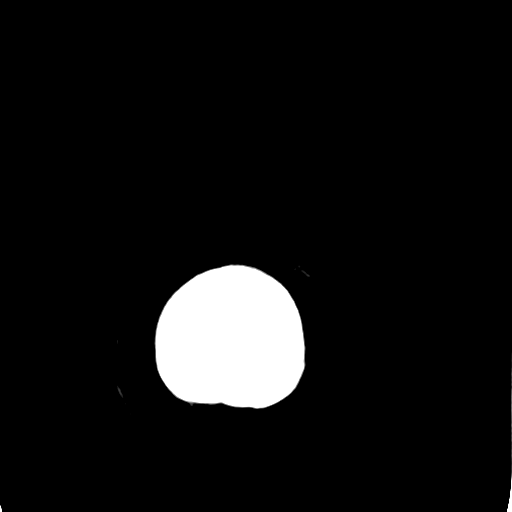
[im 34/36  brain]
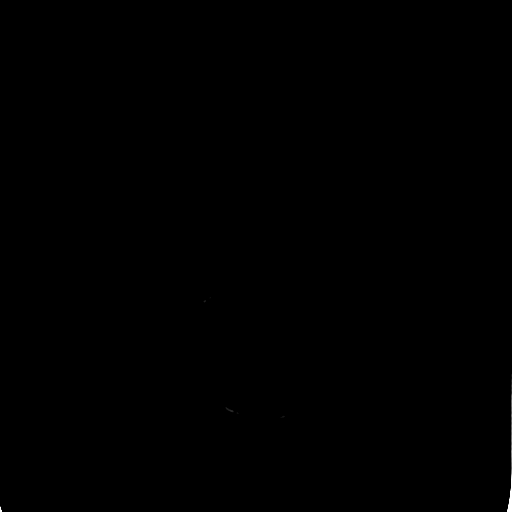

[16 of 30 positions shown; findings below may reference images not displayed]

FINDINGS: Minimal patchy white matter low density in the left
frontal lobe.  Otherwise, normal appearing cerebral hemispheres and
posterior fossa structures.  Normal size and position of the
ventricles.  No intracranial hemorrhage, mass lesion or CT evidence
of acute infarction.  Unremarkable bones and included paranasal
sinuses.  Intravenous air compatible with recent intravenous
access.
IMPRESSION: 1.  No acute abnormality.
2.  Minimal chronic small vessel white matter ischemic change in
the left frontal lobe.

## 2012-06-16 IMAGING — CT CT CHEST W/ CM
2 of 4 series · 15 of 36 positions shown, 18 images · IV contrast (Omnipaque 300)
Comparison: Chest radiograph 03/27/2011.

CLINICAL DATA: Hilar mass.  Interscapular pain.  Dry cough.

CT CHEST WITH CONTRAST
TECHNIQUE: Multidetector CT imaging of the chest was performed
following the standard protocol during bolus administration of
intravenous contrast.
Contrast: 80 ml Mmnipaque-2II.

[Series 2: chest routine with · axial · 0.66mm/px · z∈[-236,-16]mm · 12 of 52 slices shown, 15 images]
[im 4/52  mediastinal]
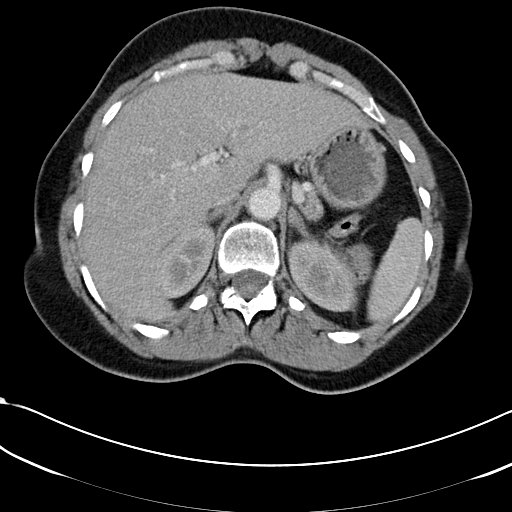
[im 4/52  lung]
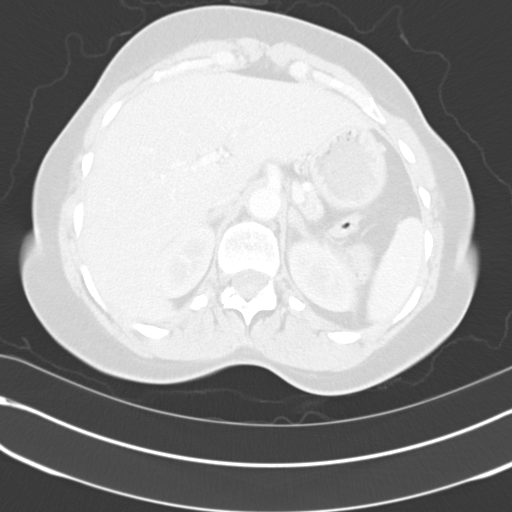
[im 8/52  lung]
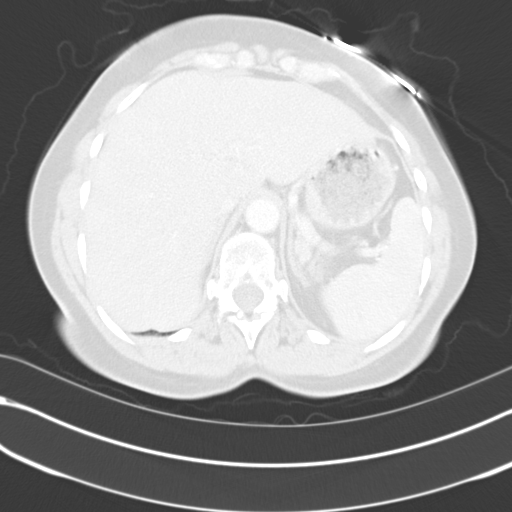
[im 12/52  lung]
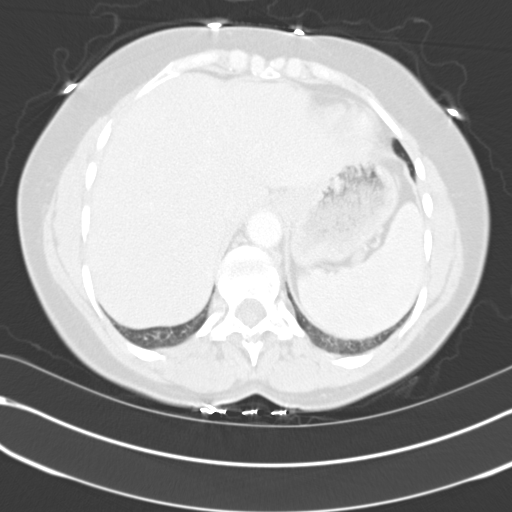
[im 16/52  lung]
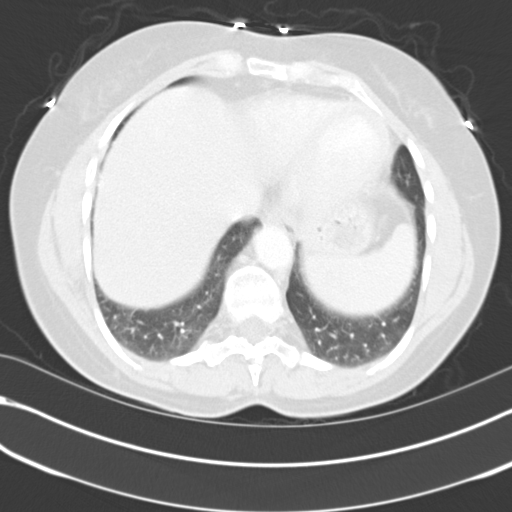
[im 20/52  mediastinal]
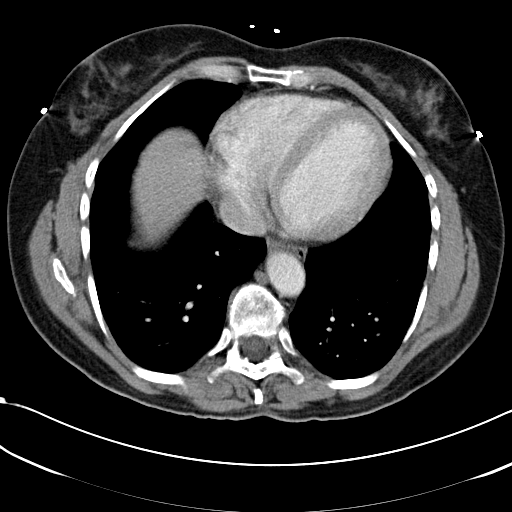
[im 20/52  lung]
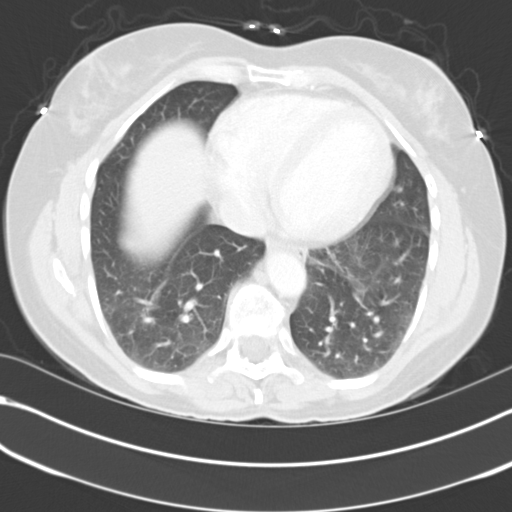
[im 24/52  lung]
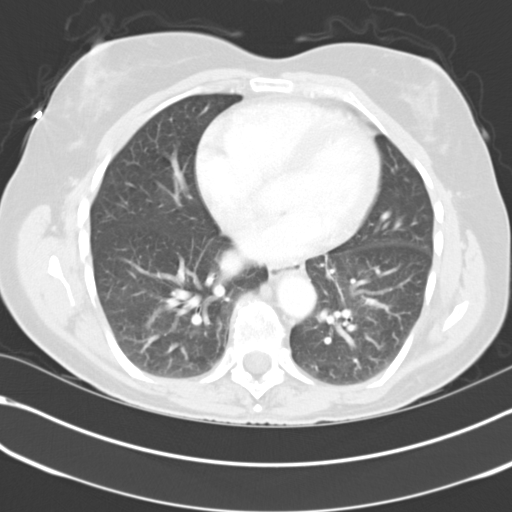
[im 28/52  lung]
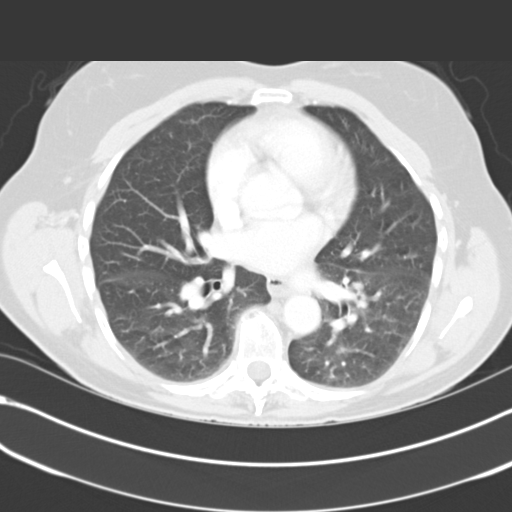
[im 32/52  lung]
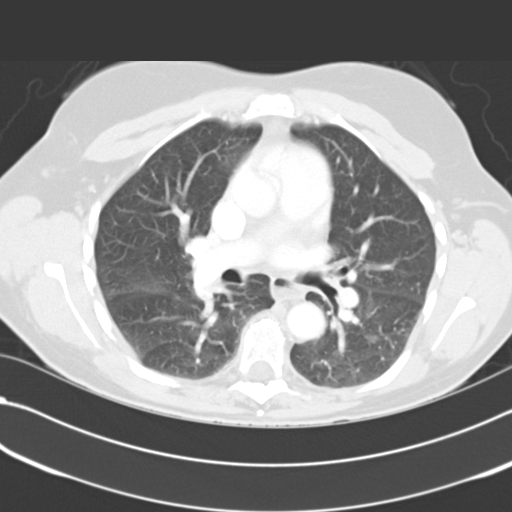
[im 36/52  mediastinal]
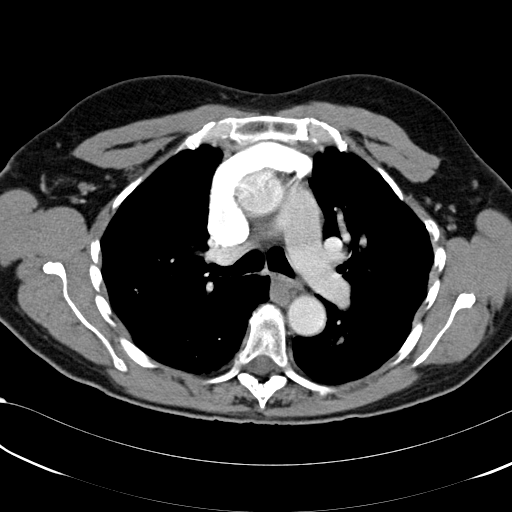
[im 36/52  lung]
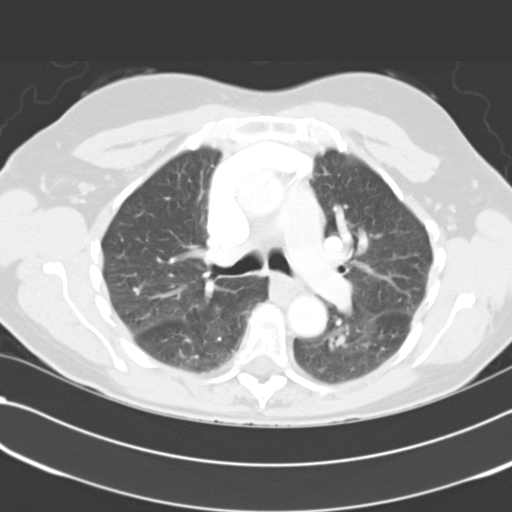
[im 40/52  lung]
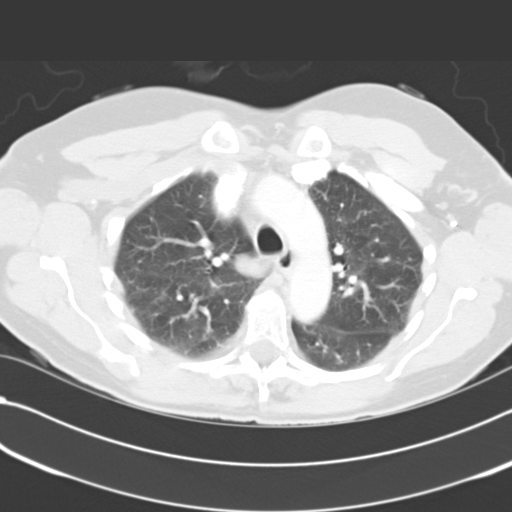
[im 44/52  lung]
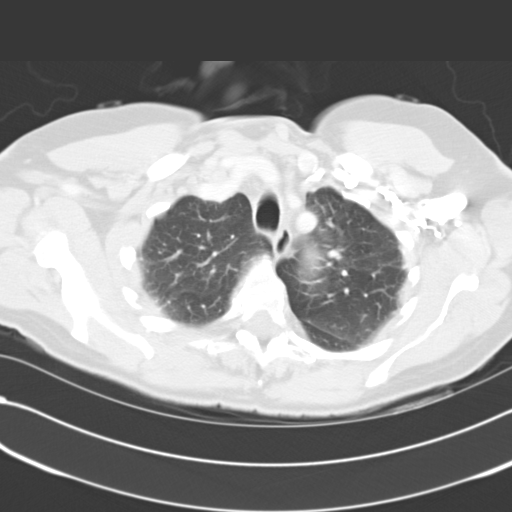
[im 48/52  lung]
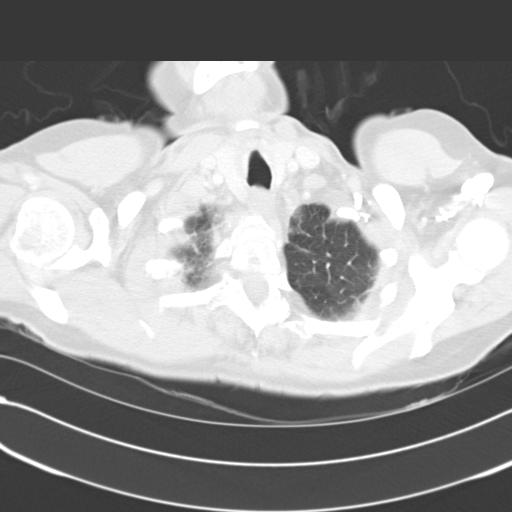

[Series 602: cor · coronal · 0.66mm/px · 3 of 103 slices shown]
[im 21/103  lung]
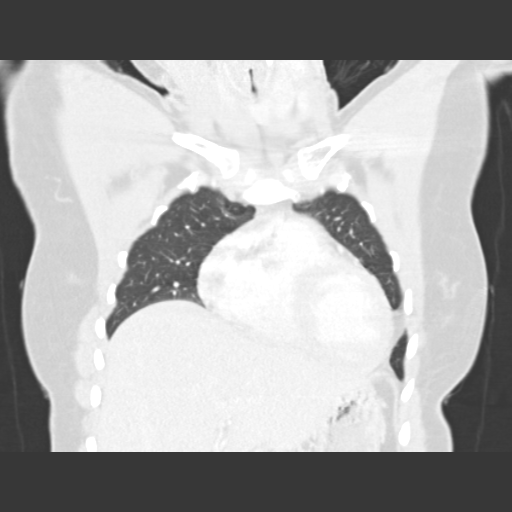
[im 41/103  lung]
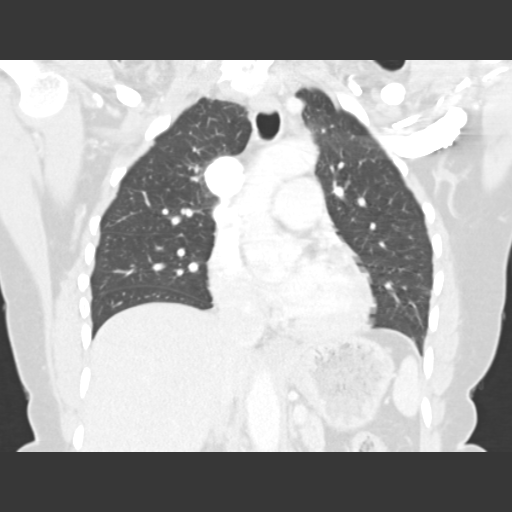
[im 62/103  lung]
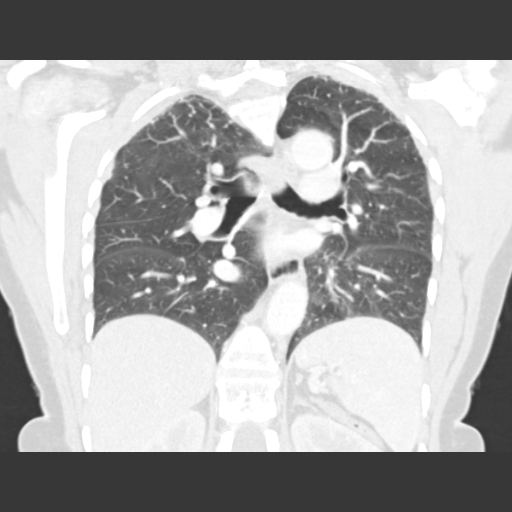

[15 of 36 positions shown; findings below may reference images not displayed]

FINDINGS: No pathologically enlarged mediastinal, hilar or axillary
lymph nodes.  Lower esophagus is mildly dilated and contains an air-
fluid level.  Heart is at the upper limits of normal in size.  No
pericardial effusion.

Biapical pleural parenchymal scarring.  Mild dependent atelectasis
bilaterally.  No pleural fluid.  Airway is unremarkable.

Incidental imaging of the upper abdomen shows no acute findings.
No worrisome lytic or sclerotic lesions.
IMPRESSION: No acute findings.  No evidence of a right hilar mass.

## 2013-05-08 ENCOUNTER — Encounter: Payer: Self-pay | Admitting: Vascular Surgery

## 2013-05-09 ENCOUNTER — Ambulatory Visit (INDEPENDENT_AMBULATORY_CARE_PROVIDER_SITE_OTHER): Payer: BC Managed Care – PPO | Admitting: Vascular Surgery

## 2013-05-09 ENCOUNTER — Encounter (INDEPENDENT_AMBULATORY_CARE_PROVIDER_SITE_OTHER): Payer: BC Managed Care – PPO | Admitting: *Deleted

## 2013-05-09 ENCOUNTER — Encounter: Payer: Self-pay | Admitting: Vascular Surgery

## 2013-05-09 VITALS — BP 148/89 | HR 81 | Resp 16 | Ht 68.0 in | Wt 221.0 lb

## 2013-05-09 DIAGNOSIS — R609 Edema, unspecified: Secondary | ICD-10-CM

## 2013-05-09 DIAGNOSIS — I83893 Varicose veins of bilateral lower extremities with other complications: Secondary | ICD-10-CM

## 2013-05-09 DIAGNOSIS — I890 Lymphedema, not elsewhere classified: Secondary | ICD-10-CM

## 2013-05-09 DIAGNOSIS — I898 Other specified noninfective disorders of lymphatic vessels and lymph nodes: Secondary | ICD-10-CM

## 2013-05-09 NOTE — Progress Notes (Signed)
[facility]  Patient name: Jessica Yates MRN: [id number] DOB: [birth year] Sex: female  REASON FOR VISIT: follow up of lymphedema  HPI: Jessica Yates is a 62 y.o. female I been following for a long time with bilateral lower extremity lymphedema. She comes in for a yearly follow up visit. She has worsening swelling in both legs but more significantly on the left side. She has not been elevating her legs much. She tries to wear her compression stockings. She did have some mild cellulitis of her left leg which was treated with doxycycline for one week and resolved. She denies fever or chills. I do not get any clear-cut history of claudication or rest pain. She also complains of some varicose veins bilaterally.  Past Medical History  Diagnosis Date  . Vitamin D deficiency   . Seasonal allergies   . Cellulitis   . Asthma   . Cough   . Peripheral venous insufficiency   . DVT (deep venous thrombosis) 2002    left leg  . Sickle cell trait   . Glaucoma   . Arthritis   . Anxiety   . Palpitations   . Blood clot in vein   . Depression 02/06/2008  . Lymphedema   . Anemia 2002    Family History  Problem Relation Age of Onset  . Allergies Mother   . Asthma Mother   . Other Mother     enlarged heart  . Arthritis Mother   . Breast cancer Mother   . Diabetes Mother   . Hypertension Mother   . Cancer Mother     breast  . Hyperlipidemia Mother   . Allergies Sister   . Heart disease Maternal Grandmother   . Breast cancer Maternal Grandmother   . Birth defects Maternal Grandmother     breast  . Arthritis Sister   . Cancer Father 89    leukemia    SOCIAL HISTORY: History  Substance Use Topics  . Smoking status: Never Smoker   . Smokeless tobacco: Never Used  . Alcohol Use: No    No Known Allergies  Current Outpatient Prescriptions  Medication Sig Dispense Refill  . aspirin 81 MG tablet Take 81 mg by mouth daily.        . Cholecalciferol  (VITAMIN D3) 1000 UNITS CAPS Take 2,000 capsules by mouth daily.       Marland Kitchen Cod Liver Oil 1000 MG CAPS Take 1 capsule by mouth 2 (two) times daily.        . diclofenac (VOLTAREN) 75 MG EC tablet 2 (two) times daily.      . Multiple Vitamins-Minerals (MULTIVITAMIN PO) Take by mouth daily.      . TRAVATAN Z 0.004 % SOLN ophthalmic solution Place 1 drop into both eyes At bedtime.      . vitamin E 1000 UNIT capsule Take 1,200 Units by mouth daily.        . furosemide (LASIX) 40 MG tablet Take 40 mg by mouth daily.        Marland Kitchen HYDROcodone-acetaminophen (NORCO) 5-325 MG per tablet 1-2 tabs by mouth every 4-6 hours as needed for pain      . naproxen (NAPROSYN) 500 MG tablet Take 500 mg by mouth 2 (two) times daily as needed.         No current facility-administered medications for this visit.    REVIEW OF SYSTEMS: Arly.Keller ] denotes positive finding; [  ] denotes negative finding  CARDIOVASCULAR:  [ ]   chest pain   [ ]  chest pressure   [ ]  palpitations   [ ]  orthopnea   [ ]  dyspnea on exertion   [ ]  claudication   [ ]  rest pain   [ ]  DVT   [ ]  phlebitis PULMONARY:   [ ]  productive cough   [ ]  asthma   [ ]  wheezing NEUROLOGIC:   Arly.Keller ] weakness  Arly.Keller ] paresthesias  [ ]  aphasia  [ ]  amaurosis  [ ]  dizziness HEMATOLOGIC:   [ ]  bleeding problems   [ ]  clotting disorders MUSCULOSKELETAL:  [ ]  joint pain   [ ]  joint swelling Arly.Keller ] leg swelling GASTROINTESTINAL: [ ]   blood in stool  [ ]   hematemesis GENITOURINARY:  [ ]   dysuria  [ ]   hematuria PSYCHIATRIC:  [ ]  history of major depression INTEGUMENTARY:  Arly.Keller ] rashes  [ ]  ulcers CONSTITUTIONAL:  [ ]  fever   [ ]  chills  PHYSICAL EXAM: Filed Vitals:   05/09/13 1303  BP: 148/89  Pulse: 81  Resp: 16  Height: 5\' 8"  (1.727 m)  Weight: 221 lb (100.245 kg)  SpO2: 100%   Body mass index is 33.61 kg/(m^2). GENERAL: The patient is a well-nourished female, in no acute distress. The vital signs are documented above. CARDIOVASCULAR: There is a regular rate and rhythm. I do  not detect carotid bruits. Both feet are warm and well perfused. She has significant bilateral lower extremity swelling up to her hip were significantly on the left side. PULMONARY: There is good air exchange bilaterally without wheezing or rales. ABDOMEN: Soft and non-tender with normal pitched bowel sounds.  MUSCULOSKELETAL: There are no major deformities or cyanosis. NEUROLOGIC: No focal weakness or paresthesias are detected. SKIN: She has sometimes ectatic veins and both lateral thighs. PSYCHIATRIC: The patient has a normal affect.  DATA:  She did have a venous duplex scan today which I have independently interpreted. This shows significant reflux in her right greater saphenous vein. On the left side she has reflux and a branch of the saphenous vein. There is also deep vein reflux bilaterally.  MEDICAL ISSUES: This patient has evidence of long-standing lymphedema and also chronic venous insufficiency. I've explained that the treatment for this is the same and that is intermittent leg elevation and compression therapy. I have written her a prescription for knee-high compression stockings with a 20-30 mm of mercury pressure gradient. We have also discussed the importance of intermittent leg elevation. I plan on seeing her back in one year. However if her symptoms worsen. She knows to call sooner. She could potentially be considered for laser ablation of the right greater saphenous vein if her symptoms on the right worsen.  Gervase Colberg S [facility] [place] Beeper: [phone]

## 2014-04-04 ENCOUNTER — Other Ambulatory Visit: Payer: Self-pay | Admitting: Family Medicine

## 2014-04-24 ENCOUNTER — Encounter: Payer: Self-pay | Admitting: Internal Medicine

## 2014-05-15 ENCOUNTER — Ambulatory Visit: Payer: BC Managed Care – PPO | Admitting: Vascular Surgery

## 2014-06-04 ENCOUNTER — Encounter: Payer: Self-pay | Admitting: Vascular Surgery

## 2014-06-05 ENCOUNTER — Ambulatory Visit: Payer: BC Managed Care – PPO | Admitting: Vascular Surgery

## 2014-06-11 ENCOUNTER — Encounter: Payer: Self-pay | Admitting: Vascular Surgery

## 2014-06-12 ENCOUNTER — Ambulatory Visit: Payer: BC Managed Care – PPO | Admitting: Vascular Surgery

## 2014-07-09 ENCOUNTER — Encounter: Payer: Self-pay | Admitting: Vascular Surgery

## 2014-07-10 ENCOUNTER — Ambulatory Visit: Payer: BC Managed Care – PPO | Admitting: Vascular Surgery

## 2014-08-13 ENCOUNTER — Encounter: Payer: Self-pay | Admitting: Vascular Surgery

## 2014-08-14 ENCOUNTER — Ambulatory Visit: Payer: BC Managed Care – PPO | Admitting: Vascular Surgery

## 2014-09-10 ENCOUNTER — Encounter: Payer: Self-pay | Admitting: Vascular Surgery

## 2014-09-11 ENCOUNTER — Ambulatory Visit: Payer: BC Managed Care – PPO | Admitting: Vascular Surgery

## 2014-10-01 ENCOUNTER — Encounter: Payer: Self-pay | Admitting: Vascular Surgery

## 2014-10-02 ENCOUNTER — Ambulatory Visit: Payer: BC Managed Care – PPO | Admitting: Vascular Surgery

## 2014-10-15 ENCOUNTER — Encounter: Payer: Self-pay | Admitting: Family

## 2014-10-16 ENCOUNTER — Ambulatory Visit (INDEPENDENT_AMBULATORY_CARE_PROVIDER_SITE_OTHER): Payer: BC Managed Care – PPO | Admitting: Family

## 2014-10-16 ENCOUNTER — Encounter: Payer: Self-pay | Admitting: Family

## 2014-10-16 VITALS — BP 121/77 | HR 97 | Resp 16 | Ht 67.5 in | Wt 236.0 lb

## 2014-10-16 DIAGNOSIS — I890 Lymphedema, not elsewhere classified: Secondary | ICD-10-CM

## 2014-10-16 DIAGNOSIS — I872 Venous insufficiency (chronic) (peripheral): Secondary | ICD-10-CM

## 2014-10-16 NOTE — Patient Instructions (Signed)
Venous Stasis or Chronic Venous Insufficiency  Chronic venous insufficiency, also called venous stasis, is a condition that affects the veins in the legs. The condition prevents blood from being pumped through these veins effectively. Blood may no longer be pumped effectively from the legs back to the heart. This condition can range from mild to severe. With proper treatment, you should be able to continue with an active life.  CAUSES   Chronic venous insufficiency occurs when the vein walls become stretched, weakened, or damaged or when valves within the vein are damaged. Some common causes of this include:  · High blood pressure inside the veins (venous hypertension).  · Increased blood pressure in the leg veins from long periods of sitting or standing.  · A blood clot that blocks blood flow in a vein (deep vein thrombosis).  · Inflammation of a superficial vein (phlebitis) that causes a blood clot to form.  RISK FACTORS  Various things can make you more likely to develop chronic venous insufficiency, including:  · Family history of this condition.  · Obesity.  · Pregnancy.  · Sedentary lifestyle.  · Smoking.  · Jobs requiring long periods of standing or sitting in one place.  · Being a certain age. Women in their 40s and 50s and men in their 70s are more likely to develop this condition.  SIGNS AND SYMPTOMS   Symptoms may include:   · Varicose veins.  · Skin breakdown or ulcers.  · Reddened or discolored skin on the leg.  · Brown, smooth, tight, and painful skin just above the ankle, usually on the inside surface (lipodermatosclerosis).  · Swelling.  DIAGNOSIS   To diagnose this condition, your health care provider will take a medical history and do a physical exam. The following tests may be ordered to confirm the diagnosis:  · Duplex ultrasound--A procedure that produces a picture of a blood vessel and nearby organs and also provides information on blood flow through the blood vessel.  · Plethysmography--A  procedure that tests blood flow.  · A venogram, or venography--A procedure used to look at the veins using X-ray and dye.  TREATMENT  The goals of treatment are to help you return to an active life and to minimize pain or disability. Treatment will depend on the severity of the condition. Medical procedures may be needed for severe cases. Treatment options may include:   · Use of compression stockings. These can help with symptoms and lower the chances of the problem getting worse, but they do not cure the problem.  · Sclerotherapy--A procedure involving an injection of a material that "dissolves" the damaged veins. Other veins in the network of blood vessels take over the function of the damaged veins.  · Surgery to remove the vein or cut off blood flow through the vein (vein stripping or laser ablation surgery).  · Surgery to repair a valve.  HOME CARE INSTRUCTIONS   · Wear compression stockings as directed by your health care provider.  · Only take over-the-counter or prescription medicines for pain, discomfort, or fever as directed by your health care provider.  · Follow up with your health care provider as directed.  SEEK MEDICAL CARE IF:   · You have redness, swelling, or increasing pain in the affected area.  · You see a red streak or line that extends up or down from the affected area.  · You have a breakdown or loss of skin in the affected area, even if the breakdown is   sudden numbness or weakness in the foot or ankle below the affected area, or you have trouble moving your foot or ankle.  You have a fever or persistent symptoms for more than 2-3 days.  You have a fever and your symptoms suddenly get worse. MAKE SURE YOU:   Understand these instructions.  Will watch your  condition.  Will get help right away if you are not doing well or get worse. Document Released: 01/03/2007 Document Revised: 06/20/2013 Document Reviewed: 05/07/2013 [facility] Patient Information 2015 [place], [place]. This information is not intended to replace advice given to you by your health care provider. Make sure you discuss any questions you have with your health care provider.   Lymphedema Lymphedema is a swelling caused by the abnormal collection of lymph under the skin. The lymph is fluid from the tissues in your body that travels in the lymphatic system. This system is part of the immune system that includes lymph nodes and vessels. The lymph vessels collect and carry the excess fluid, fats, proteins, and wastes from the tissues of the body to the bloodstream. This system also works to clean and remove bacteria and waste products from the body.  Lymphedema occurs when the lymphatic system is blocked. When the lymph vessels or lymph nodes are blocked or damaged, lymph does not drain properly. This causes abnormal build up of lymph. This leads to swelling in the arms or legs. Lymphedema cannot be cured by medicines. But the swelling can be reduced by physical methods. CAUSES  There are two types of lymphedema. Primary lymphedema is caused by the absence or abnormality of the lymph vessel at birth. It is also known as inherited lymphedema, which occurs rarely. Secondary or acquired lymphedema occurs when the lymph vessel is damaged or blocked. The causes of lymph vessel blockage are:   Skin infection like cellulites.  Infection by parasites (filariasis).  Injury.  Cancer.  Radiation therapy.  Formation of scar tissue.  Surgery. SYMPTOMS  The symptoms of lymphedema are:  Abnormal swelling of the arm or leg.  Heavy or tight feeling in your arm or leg.  Tight-fitting shoes or rings.  Redness of skin over the affected area.  Limited movement of the affected limb.  Some  patients complain about sensitivity to touch and discomfort in the limb(s) affected. You may not have these symptoms immediately following injury. They usually appear within a few days or even years after injury. Inform your caregiver, if you have any of these symptoms. Early treatment can avoid further problems.  DIAGNOSIS  First, your caregiver will inquire about any surgery you have had or medicines you are taking. He will then examine you. Your caregiver may order special imaging tests, such as:  Lymphoscintigraphy (a test in which a low dose of radioactive substance is injected to trace the flow of lymph through the lymph vessels).  MRI (imaging tests using magnetic fields).  Computed tomography (test using special cross-sectional X-rays).  Duplex ultrasound (test using high-frequency sound waves to show the vessels and the blood flow on a screen).  Lymphangiography (special X-ray taken after injecting a contrast dye into the lymph vessel). It is now rarely done. TREATMENT  Lymphedema can be treated in different ways. Your caregiver will decide the type of treatment depending on the cause. Treatment may include:  Exercise: Special exercises will help fluid move out easily from the affected part. This should be done as per your caregiver's advice.  Manual lymph drainage: Gentle massage of the affected  limb makes the fluid to move out more freely.  Compression: Compression stockings or external pump apply pressure over the affected limb. This helps the fluid to move out from the arm or leg. Bandaging can also help to move the fluid out from the affected part. Your caregiver will decide the method that suits you the best.  Medicines: Your caregiver may prescribe antibiotics, if you have infection.  Surgery: Your caregiver may advise surgery for severe lymphedema. It is reserved for special cases when the patient has difficulty moving. Your surgeon may remove excess tissue from the arm or  leg. This will help to ease your movement. Physical therapy may have to be continued after surgery. HOME CARE INSTRUCTIONS  The area is very fragile and is predisposed to injury and infection.  Eat a healthy diet.  Exercise regularly as per advice.  Keep the affected area clean and dry.  Use gloves while cooking or gardening.  Protect your skin from cuts.  Use electric razor to shave the affected area.  Keep affected limb elevated.  Do not wear tight clothes, shoes, or jewelry as it may cause the tissue to be strangled.  Do not use heat pads over the affected area.  Do not sit with cross legs.  Do not walk barefoot.  Do not carry weight on the affected arm.  Avoid having blood pressure checked on the affected limb. SEEK MEDICAL CARE IF:  You continue to have swelling in your limb. SEEK IMMEDIATE MEDICAL CARE IF:   You have high fever.  You have skin rash.  You have chills or sweats.  You have pain or redness.  You have a cut that does not heal. MAKE SURE YOU:   Understand these instructions.  Will watch your condition.  Will get help right away if you are not doing well or get worse. Document Released: 06/27/2007 Document Revised: 08/16/2012 Document Reviewed: 06/02/2009 [facility] Patient Information 2015 [place], [place]. This information is not intended to replace advice given to you by your health care provider. Make sure you discuss any questions you have with your health care provider.

## 2014-10-16 NOTE — Progress Notes (Signed)
Established Venous Insufficiency  History of Present Illness  Jessica Yates is a 64 y.o. (1951/01/27) female whom Dr. Scot Dock last saw in August 2014. She returns today with C/O Color change Bilat. lower leg, 4-6 mo. and Left groin area off/on pain, several yrs.  Pt denies any history of stroke or TIA. She states that she has been wearing the knee high high compression hose but admits to not elevating her legs much.  She has evidence of long-standing lymphedema and also chronic venous insufficiency. At her August 2014 visit and today Dr. Scot Dock explained to pt that the treatment for this is the same and that is intermittent leg elevation and compression therapy. Dr. Scot Dock gave pt a prescription for knee-high compression stockings with a 20-30 mm of mercury pressure gradient at her 2014 visit. Dr. Scot Dock had also discussed with the pt the importance of intermittent leg elevation and planned on seeing her back in one year. However if her symptoms worsened she was informed to call sooner. She could potentially be considered for laser ablation of the right greater saphenous vein if her symptoms on the right worsen.  She takes a daily 81 mg ASA.   Past Medical History  Diagnosis Date  . Vitamin D deficiency   . Seasonal allergies   . Cellulitis   . Asthma   . Cough   . Peripheral venous insufficiency   . DVT (deep venous thrombosis) 2002    left leg  . Sickle cell trait   . Glaucoma   . Arthritis   . Anxiety   . Palpitations   . Blood clot in vein   . Depression 02/06/2008  . Lymphedema   . Anemia 2002    Social History History  Substance Use Topics  . Smoking status: Never Smoker   . Smokeless tobacco: Never Used  . Alcohol Use: No    Family History Family History  Problem Relation Age of Onset  . Allergies Mother   . Asthma Mother   . Other Mother     enlarged heart  . Arthritis Mother   . Breast cancer Mother   . Diabetes Mother   . Hypertension Mother     . Cancer Mother     breast  . Hyperlipidemia Mother   . Allergies Sister   . Heart disease Maternal Grandmother   . Breast cancer Maternal Grandmother   . Birth defects Maternal Grandmother     breast  . Arthritis Sister   . Cancer Father 56    leukemia    Surgical History Past Surgical History  Procedure Laterality Date  . Total abdominal hysterectomy  07/2001  . Cosmetic surgery  1975    nasal reconstruction    No Known Allergies  Current Outpatient Prescriptions  Medication Sig Dispense Refill  . aspirin 81 MG tablet Take 81 mg by mouth daily.      Marland Kitchen Cod Liver Oil 1000 MG CAPS Take 1 capsule by mouth 2 (two) times daily.      . diclofenac (VOLTAREN) 75 MG EC tablet 2 (two) times daily.    . Multiple Vitamins-Minerals (MULTIVITAMIN PO) Take by mouth daily.    Marland Kitchen omeprazole (PRILOSEC) 20 MG capsule Take 20 mg by mouth daily.    . TRAVATAN Z 0.004 % SOLN ophthalmic solution Place 1 drop into both eyes At bedtime.    . vitamin B-12 (CYANOCOBALAMIN) 1000 MCG tablet Take 1,000 mcg by mouth daily.    . Cholecalciferol (VITAMIN D3) 1000  UNITS CAPS Take 2,000 capsules by mouth daily.     . furosemide (LASIX) 40 MG tablet Take 40 mg by mouth daily.      Marland Kitchen HYDROcodone-acetaminophen (NORCO) 5-325 MG per tablet 1-2 tabs by mouth every 4-6 hours as needed for pain    . naproxen (NAPROSYN) 500 MG tablet Take 500 mg by mouth 2 (two) times daily as needed.      . vitamin E 1000 UNIT capsule Take 1,200 Units by mouth daily.       No current facility-administered medications for this visit.    REVIEW OF SYSTEMS: see HPI for pertinent positives and negatives   Physical Examination  Filed Vitals:   10/16/14 1557  BP: 121/77  Pulse: 97  Resp: 16  Height: 5' 7.5" (1.715 m)  Weight: 236 lb (107.049 kg)  SpO2: 98%   Body mass index is 36.4 kg/(m^2).  General: The patient appears their stated age.  Obese female. HEENT:  No gross abnormalities Pulmonary: Respirations are  non-labored. Abdomen: Soft and non-tender. Musculoskeletal: There are no major deformities.   Neurologic: No focal weakness or paresthesias are detected. Skin: There are no ulcers, lymphedema in lower legs with darkening of skin in pretibial area. Psychiatric: The patient has normal affect. Cardiovascular: There is a regular rate and rhythm    Vascular: Vessel Right Left  Aorta Not palpable N/A  Popliteal Not palpable Not palpable  PT Monophasic by Doppler Monophasic by Doppler  DP Biphasic by Doppler Biphasic by Doppler     Medical Decision Making  [name] is a 64 y.o. female who presents with: bilateral lower leg chronic venous insufficiency and lymphedema. Dr. Scot Dock spoke with and examined pt. Based on the patient's vascular studies and examination, I have offered the patient: return in a year for evaluation. However if her symptoms worsened she was informed to call sooner. She could potentially be considered for laser ablation of the right greater saphenous vein if her symptoms on the right worsen.   I discussed with the patient the use of her 20-30 mm knee high compression stockings and intermittent leg elevation two to three times/day for at least 20 minutes.   Thank you for allowing [place] to participate in this patient's care.  NICKEL, Sharmon Leyden, RN, MSN, FNP-C [facility] [place] Office: [phone]  10/16/2014, 3:59 PM  Clinic MD: Scot Dock

## 2015-07-25 ENCOUNTER — Telehealth: Payer: Self-pay | Admitting: Internal Medicine

## 2015-07-25 NOTE — Telephone Encounter (Signed)
Spoke with pt. Advised her that [facility] does not have any availability until March. She wanted an afternoon appointment. Consult has been scheduled for 11/13/15 at 3:15pm. Nothing further was needed.

## 2015-07-25 NOTE — Telephone Encounter (Signed)
Next avail 12/5 w/MW.Hillery Hunter

## 2015-10-17 ENCOUNTER — Encounter: Payer: Self-pay | Admitting: Vascular Surgery

## 2015-10-22 ENCOUNTER — Ambulatory Visit: Payer: BC Managed Care – PPO | Admitting: Vascular Surgery

## 2015-10-31 ENCOUNTER — Encounter: Payer: Self-pay | Admitting: Vascular Surgery

## 2015-11-05 ENCOUNTER — Ambulatory Visit: Payer: BC Managed Care – PPO | Admitting: Vascular Surgery

## 2015-11-06 ENCOUNTER — Encounter: Payer: Self-pay | Admitting: Vascular Surgery

## 2015-11-12 ENCOUNTER — Ambulatory Visit: Payer: BC Managed Care – PPO | Admitting: Vascular Surgery

## 2015-11-13 ENCOUNTER — Institutional Professional Consult (permissible substitution): Payer: BC Managed Care – PPO | Admitting: Internal Medicine

## 2015-11-26 ENCOUNTER — Encounter: Payer: Self-pay | Admitting: Vascular Surgery

## 2015-12-03 ENCOUNTER — Ambulatory Visit: Payer: BC Managed Care – PPO | Admitting: Vascular Surgery

## 2015-12-09 ENCOUNTER — Encounter: Payer: Self-pay | Admitting: Vascular Surgery

## 2015-12-17 ENCOUNTER — Ambulatory Visit: Payer: BC Managed Care – PPO | Admitting: Vascular Surgery

## 2015-12-19 ENCOUNTER — Encounter (HOSPITAL_COMMUNITY): Payer: Self-pay | Admitting: Emergency Medicine

## 2015-12-19 ENCOUNTER — Ambulatory Visit (HOSPITAL_COMMUNITY)
Admission: EM | Admit: 2015-12-19 | Discharge: 2015-12-19 | Disposition: A | Discharge status: Home / Self Care | Payer: Medicare Other | Attending: Emergency Medicine | Admitting: Emergency Medicine

## 2015-12-19 DIAGNOSIS — I890 Lymphedema, not elsewhere classified: Secondary | ICD-10-CM

## 2015-12-19 DIAGNOSIS — L03116 Cellulitis of left lower limb: Secondary | ICD-10-CM

## 2015-12-19 MED ORDER — LEVOFLOXACIN 500 MG PO TABS: 500.0000 mg | ORAL_TABLET | Freq: Every day | ORAL | Status: DC

## 2015-12-19 NOTE — Discharge Instructions (Signed)
Lymphedema Lymphedema is swelling that is caused by the abnormal collection of lymph under the skin. Lymph is fluid from the tissues in your body that travels in the lymphatic system. This system is part of the immune system and includes lymph nodes and lymph vessels. The lymph vessels collect and carry the excess fluid, fats, proteins, and wastes from the tissues of the body to the bloodstream. This system also works to clean and remove bacteria and waste products from the body. Lymphedema occurs when the lymphatic system is blocked. When the lymph vessels or lymph nodes are blocked or damaged, lymph does not drain properly, causing an abnormal buildup of lymph. This leads to swelling in the arms or legs. Lymphedema cannot be cured by medicines, but various methods can be used to help reduce the swelling. CAUSES There are two types of lymphedema. Primary lymphedema is caused by the absence or abnormality of the lymph vessel at birth. Secondary lymphedema is more common. It occurs when the lymph vessel is damaged or blocked. Common causes of lymph vessel blockage include:  Skin infection, such as cellulitis.  Infection by parasites (filariasis).  Injury.  Cancer.  Radiation therapy.  Formation of scar tissue.  Surgery. SYMPTOMS Symptoms of this condition include:  Swelling of the arm or leg.  A heavy or tight feeling in the arm or leg.  Swelling of the feet, toes, or fingers. Shoes or rings may fit more tightly than before.  Redness of the skin over the affected area.  Limited movement of the affected limb.  Sensitivity to touch or discomfort in the affected limb. DIAGNOSIS This condition may be diagnosed with:  A physical exam.  Medical history.  Imaging tests, such as:  Lymphoscintigraphy. In this test, a low dose of a radioactive substance is injected to trace the flow of lymph through the lymph vessels.  MRI.  CT scan.  Duplex ultrasound. This test uses sound waves  to produce images of the vessels and the blood flow on a screen.  Lymphangiography. In this test, a contrast dye is injected into the lymph vessel to help show blockages. TREATMENT Treatment for this condition may depend on the cause. Treatment may include:  Exercise. Certain exercises can help fluid move out of the affected limb.  Massage. Gentle massage of the affected limb can help move the fluid out of the area.  Compression. Various methods may be used to apply pressure to the affected limb in order to reduce the swelling.  Wearing compression stockings or sleeves on the affected limb.  Bandaging the affected limb.  Using an external pump that is attached to a sleeve that alternates between applying pressure and releasing pressure.  Surgery. This is usually only done for severe cases. For example, surgery may be done if you have trouble moving the limb or if the swelling does not get better with other treatments. If an underlying condition is causing the lymphedema, treatment for that condition is needed. For example, antibiotic medicines may be used to treat an infection. HOME CARE INSTRUCTIONS Activities  Exercise regularly as directed by your health care provider.  Do not sit with your legs crossed.  When possible, keep the affected limb raised (elevated) above the level of your heart.  Avoid carrying things with an arm that is affected by lymphedema.  Remember that the affected area is more likely to become injured or infected.  Take these steps to help prevent infection:  Keep the affected area clean and dry.  Protect  your skin from cuts. For example, you should use gloves while cooking or gardening. Do not walk barefoot. If you shave the affected area, use an Copy. General Instructions  Take medicines only as directed by your health care provider.  Eat a healthy diet that includes a lot of fruits and vegetables.  Do not wear tight clothes, shoes, or  jewelry.  Do not use heating pads over the affected area.  Avoid having blood pressure checked on the affected limb.  Keep all follow-up visits as directed by your health care provider. This is important. SEEK MEDICAL CARE IF:  You continue to have swelling in your limb.  You have a fever.  You have a cut that does not heal.  You have redness or pain in the affected area.  You have new swelling in your limb that comes on suddenly.  You develop purplish spots or sores (lesions) on your limb. SEEK IMMEDIATE MEDICAL CARE IF:  You have a skin rash.  You have chills or sweats.  You have shortness of breath.   This information is not intended to replace advice given to you by your health care provider. Make sure you discuss any questions you have with your health care provider.   Document Released: 06/27/2007 Document Revised: 01/14/2015 Document Reviewed: 08/07/2014 Elsevier Interactive Patient Education 2016 [employer]  Cellulitis Cellulitis is an infection of the skin and the tissue beneath it. The infected area is usually red and tender. Cellulitis occurs most often in the arms and lower legs.  CAUSES  Cellulitis is caused by bacteria that enter the skin through cracks or cuts in the skin. The most common types of bacteria that cause cellulitis are staphylococci and streptococci. SIGNS AND SYMPTOMS   Redness and warmth.  Swelling.  Tenderness or pain.  Fever. DIAGNOSIS  Your health care provider can usually determine what is wrong based on a physical exam. Blood tests may also be done. TREATMENT  Treatment usually involves taking an antibiotic medicine. HOME CARE INSTRUCTIONS   Take your antibiotic medicine as directed by your health care provider. Finish the antibiotic even if you start to feel better.  Keep the infected arm or leg elevated to reduce swelling.  Apply a warm cloth to the affected area up to 4 times per day to relieve pain.  Take medicines  only as directed by your health care provider.  Keep all follow-up visits as directed by your health care provider. SEEK MEDICAL CARE IF:   You notice red streaks coming from the infected area.  Your red area gets larger or turns dark in color.  Your bone or joint underneath the infected area becomes painful after the skin has healed.  Your infection returns in the same area or another area.  You notice a swollen bump in the infected area.  You develop new symptoms.  You have a fever. SEEK IMMEDIATE MEDICAL CARE IF:   You feel very sleepy.  You develop vomiting or diarrhea.  You have a general ill feeling (malaise) with muscle aches and pains.   This information is not intended to replace advice given to you by your health care provider. Make sure you discuss any questions you have with your health care provider.   Document Released: 06/09/2005 Document Revised: 05/21/2015 Document Reviewed: 11/15/2011 Elsevier Interactive Patient Education [employer].

## 2015-12-19 NOTE — ED Notes (Signed)
C/o bilateral leg edema onset Sunday... Hx of lymphedema... Sx today include pain and a burning sensation... Slow steady gait... A&O x4... No acute distress.

## 2015-12-22 NOTE — ED Provider Notes (Signed)
CSN: [id number]     Arrival date & time 12/19/15  1941 History   First MD Initiated Contact with Patient 12/19/15 2106     Chief Complaint  Patient presents with  . Leg Swelling   (Consider location/radiation/quality/duration/timing/severity/associated sxs/prior Treatment) HPI History obtained from patient: Location:left lower leg Context/Durationongoing swelling due to lymphedema, but over the last couple of days more swelling in the left leg, with redness and feeling hot.  Severity:3  Quality:similar to previous times with cellulitis Timing:   constant         Home Treatment: at times elevation, does not wear her compression stockings Associated symptoms:  No fever  Past Medical History  Diagnosis Date  . Vitamin D deficiency   . Seasonal allergies   . Cellulitis   . Asthma   . Cough   . Peripheral venous insufficiency   . DVT (deep venous thrombosis) ([place]) 2002    left leg  . Sickle cell trait ([place])   . Glaucoma   . Arthritis   . Anxiety   . Palpitations   . Blood clot in vein   . Depression 02/06/2008  . Lymphedema   . Anemia 2002   Past Surgical History  Procedure Laterality Date  . Total abdominal hysterectomy  07/2001  . Cosmetic surgery  1975    nasal reconstruction   Family History  Problem Relation Age of Onset  . Allergies Mother   . Asthma Mother   . Other Mother     enlarged heart  . Arthritis Mother   . Breast cancer Mother   . Diabetes Mother   . Hypertension Mother   . Cancer Mother     breast  . Hyperlipidemia Mother   . Allergies Sister   . Heart disease Maternal Grandmother   . Breast cancer Maternal Grandmother   . Birth defects Maternal Grandmother     breast  . Arthritis Sister   . Cancer Father 70    leukemia   Social History  Substance Use Topics  . Smoking status: Never Smoker   . Smokeless tobacco: Never Used  . Alcohol Use: No   OB History    No data available     Review of Systems Swollen left leg Allergies   Review of patient's allergies indicates no known allergies.  Home Medications   Prior to Admission medications   Medication Sig Start Date End Date Taking? Authorizing Provider  aspirin 81 MG tablet Take 81 mg by mouth daily.     Yes Historical Provider, MD  Cholecalciferol (VITAMIN D3) 1000 UNITS CAPS Take 2,000 capsules by mouth daily.    Yes Historical Provider, MD  Cod Liver Oil 1000 MG CAPS Take 1 capsule by mouth 2 (two) times daily.      Historical Provider, MD  diclofenac (VOLTAREN) 75 MG EC tablet 2 (two) times daily. 04/10/13   Historical Provider, MD  furosemide (LASIX) 40 MG tablet Take 40 mg by mouth daily.      Historical Provider, MD  HYDROcodone-acetaminophen (NORCO) 5-325 MG per tablet 1-2 tabs by mouth every 4-6 hours as needed for pain 03/27/11   Historical Provider, MD  levofloxacin (LEVAQUIN) 500 MG tablet Take 1 tablet (500 mg total) by mouth daily. 12/19/15   Konrad Felix, PA  Multiple Vitamins-Minerals (MULTIVITAMIN PO) Take by mouth daily.    Historical Provider, MD  naproxen (NAPROSYN) 500 MG tablet Take 500 mg by mouth 2 (two) times daily as needed.      Historical  Provider, MD  omeprazole (PRILOSEC) 20 MG capsule Take 20 mg by mouth daily.    Historical Provider, MD  TRAVATAN Z 0.004 % SOLN ophthalmic solution Place 1 drop into both eyes At bedtime. 03/14/11   Historical Provider, MD  vitamin B-12 (CYANOCOBALAMIN) 1000 MCG tablet Take 1,000 mcg by mouth daily.    Historical Provider, MD  vitamin E 1000 UNIT capsule Take 1,200 Units by mouth daily.      Historical Provider, MD   Meds Ordered and Administered this Visit  Medications - No data to display  BP 158/84 mmHg  Pulse 108  Temp(Src) 98 F (36.7 C) (Oral)  SpO2 99% No data found.   Physical Exam NURSES NOTES AND VITAL SIGNS REVIEWED. CONSTITUTIONAL: Well developed, well nourished, no acute distress HEENT: normocephalic, atraumatic EYES: Conjunctiva normal NECK:normal ROM, supple, no  adenopathy PULMONARY:No respiratory distress, normal effort MUSCULOSKELETAL: both lower legs are swollen, left is worse than right. Left lower leg is hot, red. Pitting edema. Changes of venous insuffiencey  SKIN: warm and dry without rash PSYCHIATRIC: Mood and affect, behavior are normal  ED Course  Procedures (including critical care time)  Labs Review Labs Reviewed - No data to display  Imaging Review No results found.   Visual Acuity Review  Right Eye Distance:   Left Eye Distance:   Bilateral Distance:    Right Eye Near:   Left Eye Near:    Bilateral Near:      RX keflex I have discussed with patient that I cannot rule out a DVT and she should consider going to the ER for further evaluation. Patient would prefer to go home, elevate legs take antibx. If symptoms get worse she will go to ER. Sister is present during this discussion.   MDM   1. Lymphedema of left lower extremity   2. Cellulitis of left lower extremity     Patient is reassured that there are no issues that require transfer to higher level of care at this time or additional tests. Patient is advised to continue home symptomatic treatment. Patient is advised that if there are new or worsening symptoms to attend the emergency department, contact primary care provider, or return to UC. Instructions of care provided discharged home in stable condition.    THIS NOTE WAS GENERATED USING A VOICE RECOGNITION SOFTWARE PROGRAM. ALL REASONABLE EFFORTS  WERE MADE TO PROOFREAD THIS DOCUMENT FOR ACCURACY.  I have verbally reviewed the discharge instructions with the patient. A printed AVS was given to the patient.  All questions were answered prior to discharge.      Konrad Felix, [place] 12/22/15 1007

## 2015-12-28 ENCOUNTER — Encounter (HOSPITAL_COMMUNITY): Payer: Self-pay | Admitting: *Deleted

## 2015-12-28 ENCOUNTER — Encounter (HOSPITAL_COMMUNITY): Payer: Self-pay | Admitting: Emergency Medicine

## 2015-12-28 ENCOUNTER — Emergency Department (HOSPITAL_COMMUNITY)
Admission: EM | Admit: 2015-12-28 | Discharge: 2015-12-29 | Disposition: A | Discharge status: Home / Self Care | Payer: Medicare Other | Attending: Emergency Medicine | Admitting: Emergency Medicine

## 2015-12-28 ENCOUNTER — Emergency Department (HOSPITAL_COMMUNITY): Payer: Medicare Other

## 2015-12-28 ENCOUNTER — Ambulatory Visit (HOSPITAL_COMMUNITY)
Admission: EM | Admit: 2015-12-28 | Discharge: 2015-12-28 | Disposition: A | Discharge status: Home / Self Care | Payer: Medicare Other | Attending: Family Medicine | Admitting: Family Medicine

## 2015-12-28 DIAGNOSIS — L03116 Cellulitis of left lower limb: Secondary | ICD-10-CM | POA: Diagnosis not present

## 2015-12-28 DIAGNOSIS — E559 Vitamin D deficiency, unspecified: Secondary | ICD-10-CM | POA: Insufficient documentation

## 2015-12-28 DIAGNOSIS — M1990 Unspecified osteoarthritis, unspecified site: Secondary | ICD-10-CM | POA: Diagnosis not present

## 2015-12-28 DIAGNOSIS — R000 Tachycardia, unspecified: Secondary | ICD-10-CM | POA: Insufficient documentation

## 2015-12-28 DIAGNOSIS — H409 Unspecified glaucoma: Secondary | ICD-10-CM | POA: Diagnosis not present

## 2015-12-28 DIAGNOSIS — Z8659 Personal history of other mental and behavioral disorders: Secondary | ICD-10-CM | POA: Insufficient documentation

## 2015-12-28 DIAGNOSIS — Z79899 Other long term (current) drug therapy: Secondary | ICD-10-CM | POA: Insufficient documentation

## 2015-12-28 DIAGNOSIS — Z791 Long term (current) use of non-steroidal anti-inflammatories (NSAID): Secondary | ICD-10-CM | POA: Diagnosis not present

## 2015-12-28 DIAGNOSIS — J45909 Unspecified asthma, uncomplicated: Secondary | ICD-10-CM | POA: Insufficient documentation

## 2015-12-28 DIAGNOSIS — D649 Anemia, unspecified: Secondary | ICD-10-CM | POA: Insufficient documentation

## 2015-12-28 DIAGNOSIS — Z7982 Long term (current) use of aspirin: Secondary | ICD-10-CM | POA: Insufficient documentation

## 2015-12-28 DIAGNOSIS — M7989 Other specified soft tissue disorders: Secondary | ICD-10-CM

## 2015-12-28 DIAGNOSIS — Z86718 Personal history of other venous thrombosis and embolism: Secondary | ICD-10-CM | POA: Insufficient documentation

## 2015-12-28 DIAGNOSIS — I872 Venous insufficiency (chronic) (peripheral): Secondary | ICD-10-CM | POA: Diagnosis not present

## 2015-12-28 DIAGNOSIS — Z8679 Personal history of other diseases of the circulatory system: Secondary | ICD-10-CM | POA: Insufficient documentation

## 2015-12-28 DIAGNOSIS — R600 Localized edema: Secondary | ICD-10-CM | POA: Insufficient documentation

## 2015-12-28 DIAGNOSIS — M79605 Pain in left leg: Secondary | ICD-10-CM | POA: Diagnosis present

## 2015-12-28 LAB — CBC WITH DIFFERENTIAL/PLATELET
BASOS ABS: 0 10*3/uL (ref 0.0–0.1)
Basophils Relative: 0 %
Eosinophils Absolute: 0.1 10*3/uL (ref 0.0–0.7)
Eosinophils Relative: 1 %
HEMATOCRIT: 28.5 % — AB (ref 36.0–46.0)
HEMOGLOBIN: 9.1 g/dL — AB (ref 12.0–15.0)
LYMPHS PCT: 12 %
Lymphs Abs: 1.2 10*3/uL (ref 0.7–4.0)
MCH: 25.3 pg — ABNORMAL LOW (ref 26.0–34.0)
MCHC: 31.9 g/dL (ref 30.0–36.0)
MCV: 79.4 fL (ref 78.0–100.0)
Monocytes Absolute: 0.6 10*3/uL (ref 0.1–1.0)
Monocytes Relative: 6 %
NEUTROS ABS: 8 10*3/uL — AB (ref 1.7–7.7)
NEUTROS PCT: 81 %
Platelets: 403 10*3/uL — ABNORMAL HIGH (ref 150–400)
RBC: 3.59 MIL/uL — AB (ref 3.87–5.11)
RDW: 15.6 % — ABNORMAL HIGH (ref 11.5–15.5)
WBC: 9.8 10*3/uL (ref 4.0–10.5)

## 2015-12-28 LAB — BASIC METABOLIC PANEL
ANION GAP: 11 (ref 5–15)
BUN: 6 mg/dL (ref 6–20)
CO2: 26 mmol/L (ref 22–32)
Calcium: 9.9 mg/dL (ref 8.9–10.3)
Chloride: 104 mmol/L (ref 101–111)
Creatinine, Ser: 1.03 mg/dL — ABNORMAL HIGH (ref 0.44–1.00)
GFR calc Af Amer: >60 mL/min (ref >60)
GFR calc non Af Amer: 56 mL/min — ABNORMAL LOW (ref >60)
GLUCOSE: 106 mg/dL — AB (ref 65–99)
POTASSIUM: 3.9 mmol/L (ref 3.5–5.1)
Sodium: 141 mmol/L (ref 135–145)

## 2015-12-28 MED ORDER — IOPAMIDOL (ISOVUE-370) INJECTION 76%
INTRAVENOUS | Status: AC
  Administered 2015-12-28: 100 mL
  Filled 2015-12-28: qty 100

## 2015-12-28 MED ORDER — SODIUM CHLORIDE 0.9 % IV BOLUS (SEPSIS)
500.0000 mL | Freq: Once | INTRAVENOUS | Status: AC
  Administered 2015-12-28: 500 mL via INTRAVENOUS

## 2015-12-28 NOTE — ED Provider Notes (Signed)
CSN: [id number]     Arrival date & time 12/28/15  2012 History   First MD Initiated Contact with Patient 12/28/15 2019     Chief Complaint  Patient presents with  . Leg Pain     (Consider location/radiation/quality/duration/timing/severity/associated sxs/prior Treatment) HPI Comments: 65 year old female with history of lymphedema, DVT, anxiety presents for leg swelling. The patient has been seen outpatient twice for this leg swelling and was diagnosed with cellulitis and started on levofloxacin. She reports that the leg has remained warm and red and more swollen than usual. She does have lymphedema in this leg and so it is always swollen but this is larger than usual. She denies fevers or chills. No nausea or vomiting. She reports normal sensation. She reports over time that she has developed R [place] of the skin in her lower extremities bilaterally. She was seen in follow-up at the urgent care and directed to the ER for Doppler to evaluate for DVT. Patient denies chest pain or shortness of breath.   Past Medical History  Diagnosis Date  . Vitamin D deficiency   . Seasonal allergies   . Cellulitis   . Asthma   . Cough   . Peripheral venous insufficiency   . DVT (deep venous thrombosis) ([place]) 2002    left leg  . Sickle cell trait ([place])   . Glaucoma   . Arthritis   . Anxiety   . Palpitations   . Blood clot in vein   . Depression 02/06/2008  . Lymphedema   . Anemia 2002   Past Surgical History  Procedure Laterality Date  . Total abdominal hysterectomy  07/2001  . Cosmetic surgery  1975    nasal reconstruction   Family History  Problem Relation Age of Onset  . Allergies Mother   . Asthma Mother   . Other Mother     enlarged heart  . Arthritis Mother   . Breast cancer Mother   . Diabetes Mother   . Hypertension Mother   . Cancer Mother     breast  . Hyperlipidemia Mother   . Allergies Sister   . Heart disease Maternal Grandmother   . Breast cancer Maternal Grandmother    . Birth defects Maternal Grandmother     breast  . Arthritis Sister   . Cancer Father 67    leukemia   Social History  Substance Use Topics  . Smoking status: Never Smoker   . Smokeless tobacco: Never Used  . Alcohol Use: No   OB History    No data available     Review of Systems  Constitutional: Negative for fever, chills and fatigue.  HENT: Negative for congestion.   Eyes: Negative for pain and redness.  Respiratory: Negative for cough, chest tightness and shortness of breath.   Cardiovascular: Positive for leg swelling. Negative for chest pain and palpitations.  Gastrointestinal: Negative for nausea, vomiting, abdominal pain and diarrhea.  Genitourinary: Negative for dysuria, urgency and hematuria.  Musculoskeletal: Negative for myalgias and back pain.  Skin: Positive for color change (darkening of the skin of the bilateral lower extremities, redness of the skin of the left leg).  Neurological: Negative for dizziness, weakness and headaches.  Hematological: Does not bruise/bleed easily.      Allergies  Review of patient's allergies indicates no known allergies.  Home Medications   Prior to Admission medications   Medication Sig Start Date End Date Taking? Authorizing Provider  acetaminophen (TYLENOL) 500 MG tablet Take 1,000 mg by mouth every  6 (six) hours as needed for moderate pain.   Yes Historical Provider, MD  aspirin 81 MG tablet Take 81 mg by mouth daily.     Yes Historical Provider, MD  Cholecalciferol (VITAMIN D3) 1000 UNITS CAPS Take 2,000 capsules by mouth daily.    Yes Historical Provider, MD  [facility] Liver Oil 1000 MG CAPS Take 1 capsule by mouth daily.    Yes Historical Provider, MD  levofloxacin (LEVAQUIN) 500 MG tablet Take 1 tablet (500 mg total) by mouth daily. Patient taking differently: Take 500 mg by mouth daily. For 10 days ending 12/29/15 12/19/15  Yes Konrad Felix, PA  meloxicam (MOBIC) 15 MG tablet Take 15 mg by mouth daily.   Yes Historical  Provider, MD  TRAVATAN Z 0.004 % SOLN ophthalmic solution Place 1 drop into both eyes At bedtime. 03/14/11  Yes Historical Provider, MD  triamcinolone cream (KENALOG) 0.1 % Apply 1 application topically daily as needed (for irritation).    Yes Historical Provider, MD  vitamin E 1000 UNIT capsule Take 1,000 Units by mouth daily.    Yes Historical Provider, MD  diclofenac (VOLTAREN) 75 MG EC tablet 2 (two) times daily. 04/10/13   Historical Provider, MD  furosemide (LASIX) 40 MG tablet Take 40 mg by mouth daily.      Historical Provider, MD  HYDROcodone-acetaminophen (NORCO) 5-325 MG per tablet 1-2 tabs by mouth every 4-6 hours as needed for pain 03/27/11   Historical Provider, MD  Multiple Vitamins-Minerals (MULTIVITAMIN PO) Take by mouth daily.    Historical Provider, MD  naproxen (NAPROSYN) 500 MG tablet Take 500 mg by mouth 2 (two) times daily as needed.      Historical Provider, MD  omeprazole (PRILOSEC) 20 MG capsule Take 20 mg by mouth daily.    Historical Provider, MD  vitamin B-12 (CYANOCOBALAMIN) 1000 MCG tablet Take 1,000 mcg by mouth daily.    Historical Provider, MD   BP 136/86 mmHg  Pulse 109  Temp(Src) 98.5 F (36.9 C) (Oral)  Resp 16  Ht 5\' 7"  (1.702 m)  Wt 224 lb (101.606 kg)  BMI 35.08 kg/m2  SpO2 100% Physical Exam  Constitutional: She is oriented to person, place, and time. She appears well-developed and well-nourished. No distress.  HENT:  Head: Normocephalic and atraumatic.  Right Ear: External ear normal.  Left Ear: External ear normal.  Nose: Nose normal.  Mouth/Throat: Oropharynx is clear and moist. No oropharyngeal exudate.  Eyes: EOM are normal. Pupils are equal, round, and reactive to light.  Neck: Normal range of motion. Neck supple.  Cardiovascular: Regular rhythm, normal heart sounds and intact distal pulses.  Tachycardia present.   No murmur heard. Pulses:      Dorsalis pedis pulses are 2+ on the right side, and 2+ on the left side.  Pulmonary/Chest:  Effort normal. No respiratory distress. She has no wheezes. She has no rales.  Abdominal: Soft. She exhibits no distension. There is no tenderness.  Musculoskeletal: Normal range of motion. She exhibits edema. She exhibits no tenderness.  Significant edema of the left lower extremity with erythema of the skin below the knee. There is chronic changes of the skin color is system with hemosiderin deposition.  Neurological: She is alert and oriented to person, place, and time.  Skin: Skin is warm and dry. No rash noted. She is not diaphoretic.  Vitals reviewed.   ED Course  Procedures (including critical care time) Labs Review Labs Reviewed  CBC WITH DIFFERENTIAL/PLATELET - Abnormal; Notable for the  following:    RBC 3.59 (*)    Hemoglobin 9.1 (*)    HCT 28.5 (*)    MCH 25.3 (*)    RDW 15.6 (*)    Platelets 403 (*)    Neutro Abs 8.0 (*)    All other components within normal limits  BASIC METABOLIC PANEL - Abnormal; Notable for the following:    Glucose, Bld 106 (*)    Creatinine, Ser 1.03 (*)    GFR calc non Af Amer 56 (*)    All other components within normal limits    Imaging Review No results found. I have personally reviewed and evaluated these images and lab results as part of my medical decision-making.   EKG Interpretation None      MDM  Patient was seen and evaluated in stable condition. Patient was tachycardic in the emergency department and so a CT for evaluation of PE was completed which was negative for PE. Patient presented to the ER after the time that we were able to obtain a Doppler of her lower extremity. There was concern on my examination for possible DVT consistent with the concerns from the urgent care. For this reason the patient was given a dose of Lovenox. An outpatient Doppler of the left lower extremity was ordered to be completed tomorrow. She was switched to Keflex and Bactrim for possible cellulitis of the left leg. She was instructed to elevate the  leg. Lengthy discussion was had with her regarding all results and plan of care. All of her questions were answered. She was discharged in stable condition with prescriptions for Bactrim and Keflex as well as instruction on having her Doppler completed tomorrow. Final diagnoses:  None    1. Left leg edema 2. Cellulitis 3. Possible DVT    Harvel Quale, MD 12/29/15 [id number]

## 2015-12-28 NOTE — ED Provider Notes (Signed)
CSN: [id number]     Arrival date & time 12/28/15  1855 History   First MD Initiated Contact with Patient 12/28/15 1912     Chief Complaint  Patient presents with  . Leg Swelling   (Consider location/radiation/quality/duration/timing/severity/associated sxs/prior Treatment) Patient is a 65 y.o. female presenting with leg pain. The history is provided by the patient.  Leg Pain Location:  Leg Time since incident:  2 weeks Injury: no   Pain details:    Quality:  Pressure, throbbing and cramping   Severity:  Severe   Onset quality:  Gradual Chronicity:  Chronic (seen 4/7 and given levaquin, without improvemnt  in leg sx, now grad worse.) Dislocation: no   Prior injury to area:  No   Past Medical History  Diagnosis Date  . Vitamin D deficiency   . Seasonal allergies   . Cellulitis   . Asthma   . Cough   . Peripheral venous insufficiency   . DVT (deep venous thrombosis) ([place]) 2002    left leg  . Sickle cell trait ([place])   . Glaucoma   . Arthritis   . Anxiety   . Palpitations   . Blood clot in vein   . Depression 02/06/2008  . Lymphedema   . Anemia 2002   Past Surgical History  Procedure Laterality Date  . Total abdominal hysterectomy  07/2001  . Cosmetic surgery  1975    nasal reconstruction   Family History  Problem Relation Age of Onset  . Allergies Mother   . Asthma Mother   . Other Mother     enlarged heart  . Arthritis Mother   . Breast cancer Mother   . Diabetes Mother   . Hypertension Mother   . Cancer Mother     breast  . Hyperlipidemia Mother   . Allergies Sister   . Heart disease Maternal Grandmother   . Breast cancer Maternal Grandmother   . Birth defects Maternal Grandmother     breast  . Arthritis Sister   . Cancer Father 108    leukemia   Social History  Substance Use Topics  . Smoking status: Never Smoker   . Smokeless tobacco: Never Used  . Alcohol Use: No   OB History    No data available     Review of Systems  Respiratory:  Negative for shortness of breath.   Cardiovascular: Negative for chest pain.  Musculoskeletal: Positive for joint swelling and gait problem.  Skin: Positive for rash.  All other systems reviewed and are negative.   Allergies  Review of patient's allergies indicates no known allergies.  Home Medications   Prior to Admission medications   Medication Sig Start Date End Date Taking? Authorizing Provider  aspirin 81 MG tablet Take 81 mg by mouth daily.      Historical Provider, MD  Cholecalciferol (VITAMIN D3) 1000 UNITS CAPS Take 2,000 capsules by mouth daily.     Historical Provider, MD  [facility] Liver Oil 1000 MG CAPS Take 1 capsule by mouth 2 (two) times daily.      Historical Provider, MD  diclofenac (VOLTAREN) 75 MG EC tablet 2 (two) times daily. 04/10/13   Historical Provider, MD  furosemide (LASIX) 40 MG tablet Take 40 mg by mouth daily.      Historical Provider, MD  HYDROcodone-acetaminophen (NORCO) 5-325 MG per tablet 1-2 tabs by mouth every 4-6 hours as needed for pain 03/27/11   Historical Provider, MD  levofloxacin (LEVAQUIN) 500 MG tablet Take 1 tablet (500  mg total) by mouth daily. 12/19/15   Konrad Felix, PA  Multiple Vitamins-Minerals (MULTIVITAMIN PO) Take by mouth daily.    Historical Provider, MD  naproxen (NAPROSYN) 500 MG tablet Take 500 mg by mouth 2 (two) times daily as needed.      Historical Provider, MD  omeprazole (PRILOSEC) 20 MG capsule Take 20 mg by mouth daily.    Historical Provider, MD  TRAVATAN Z 0.004 % SOLN ophthalmic solution Place 1 drop into both eyes At bedtime. 03/14/11   Historical Provider, MD  vitamin B-12 (CYANOCOBALAMIN) 1000 MCG tablet Take 1,000 mcg by mouth daily.    Historical Provider, MD  vitamin E 1000 UNIT capsule Take 1,200 Units by mouth daily.      Historical Provider, MD   Meds Ordered and Administered this Visit  Medications - No data to display  BP 124/77 mmHg  Pulse 112  Temp(Src) 99.3 F (37.4 C) (Oral)  Resp 16  SpO2 99% No data  found.   Physical Exam  Constitutional: She is oriented to person, place, and time. She appears well-developed and well-nourished.  Musculoskeletal: She exhibits edema and tenderness.  bilat lymphedema with left legat least twice the size(not measured), also stasis discoloration and tender to palp, unable to detect pulses b/o edema.  Neurological: She is alert and oriented to person, place, and time.  Skin: Skin is warm and dry. No erythema.  Nursing note and vitals reviewed.   ED Course  Procedures (including critical care time)  Labs Review Labs Reviewed - No data to display  Imaging Review No results found.   Visual Acuity Review  Right Eye Distance:   Left Eye Distance:   Bilateral Distance:    Right Eye Near:   Left Eye Near:    Bilateral Near:         MDM   1. Deep venous insufficiency   sent for eval of likely DVT of left leg in setting of chronic lymphedema.    Billy Fischer, MD 12/28/15 [phone]

## 2015-12-28 NOTE — ED Notes (Addendum)
Patient reports increased swelling to left leg, patient has hx of severe lower extremity swelling bilateral. On exam darkening noted to left lower leg, patient reports this is new, with swelling almost twice the size of right leg going into knee. Redness noted. Patient has almost completed prescription for levofloxacin. No improvement in symptoms since exam on 7th. Due to swelling unable to palpate pulses to bilateral feet.

## 2015-12-28 NOTE — ED Notes (Signed)
Report called to [facility] in ED. Patient verbalized understanding of need for transport due to increased swelling and darkening of foot.

## 2015-12-28 NOTE — ED Notes (Signed)
Pt. transferred from [facility] urgent care  due to worsening left lower leg swelling for several days , denies injury , currently taking oral antibiotic for cellulitis with no improvement , denies fever or chills. Respirations unlabored .

## 2015-12-29 ENCOUNTER — Ambulatory Visit (HOSPITAL_BASED_OUTPATIENT_CLINIC_OR_DEPARTMENT_OTHER)
Admission: RE | Admit: 2015-12-29 | Discharge: 2015-12-29 | Disposition: A | Discharge status: Home / Self Care | Payer: Medicare Other | Source: Ambulatory Visit | Attending: Emergency Medicine | Admitting: Emergency Medicine

## 2015-12-29 DIAGNOSIS — M79605 Pain in left leg: Secondary | ICD-10-CM

## 2015-12-29 DIAGNOSIS — M7989 Other specified soft tissue disorders: Secondary | ICD-10-CM

## 2015-12-29 MED ORDER — CEPHALEXIN 500 MG PO CAPS: 500.0000 mg | ORAL_CAPSULE | Freq: Four times a day (QID) | ORAL | Status: DC

## 2015-12-29 MED ORDER — ENOXAPARIN SODIUM 150 MG/ML ~~LOC~~ SOLN
150.0000 mg | Freq: Once | SUBCUTANEOUS | Status: AC
  Administered 2015-12-29: 150 mg via SUBCUTANEOUS
  Filled 2015-12-29: qty 1

## 2015-12-29 MED ORDER — SULFAMETHOXAZOLE-TRIMETHOPRIM 800-160 MG PO TABS: 1.0000 | ORAL_TABLET | Freq: Two times a day (BID) | ORAL | Status: DC

## 2015-12-29 MED ORDER — SULFAMETHOXAZOLE-TRIMETHOPRIM 800-160 MG PO TABS
1.0000 | ORAL_TABLET | Freq: Once | ORAL | Status: AC
Start: 2015-12-29 — End: 2015-12-29
  Administered 2015-12-29: 1 via ORAL
  Filled 2015-12-29: qty 1

## 2015-12-29 MED ORDER — CEPHALEXIN 250 MG PO CAPS
500.0000 mg | ORAL_CAPSULE | Freq: Once | ORAL | Status: AC
  Administered 2015-12-29: 500 mg via ORAL
  Filled 2015-12-29: qty 2

## 2015-12-29 NOTE — Discharge Instructions (Signed)
You were seen and evaluated today for your leg swelling. It appears that the skin on her leg is infected. Take the antibiotics prescribed. In order to evaluate for a blood clot in your leg study needs to be completed tomorrow in the morning. Presented to [facility] as directed on your discharge instructions. You were given a dose of a blood thinner to protect you have this is a blood clot. Follow-up with her primary care physician for reevaluation.  Cellulitis Cellulitis is an infection of the skin and the tissue beneath it. The infected area is usually red and tender. Cellulitis occurs most often in the arms and lower legs.  CAUSES  Cellulitis is caused by bacteria that enter the skin through cracks or cuts in the skin. The most common types of bacteria that cause cellulitis are staphylococci and streptococci. SIGNS AND SYMPTOMS   Redness and warmth.  Swelling.  Tenderness or pain.  Fever. DIAGNOSIS  Your health care provider can usually determine what is wrong based on a physical exam. Blood tests may also be done. TREATMENT  Treatment usually involves taking an antibiotic medicine. HOME CARE INSTRUCTIONS   Take your antibiotic medicine as directed by your health care provider. Finish the antibiotic even if you start to feel better.  Keep the infected arm or leg elevated to reduce swelling.  Apply a warm cloth to the affected area up to 4 times per day to relieve pain.  Take medicines only as directed by your health care provider.  Keep all follow-up visits as directed by your health care provider. SEEK MEDICAL CARE IF:   You notice red streaks coming from the infected area.  Your red area gets larger or turns dark in color.  Your bone or joint underneath the infected area becomes painful after the skin has healed.  Your infection returns in the same area or another area.  You notice a swollen bump in the infected area.  You develop new symptoms.  You have a fever. SEEK  IMMEDIATE MEDICAL CARE IF:   You feel very sleepy.  You develop vomiting or diarrhea.  You have a general ill feeling (malaise) with muscle aches and pains.   This information is not intended to replace advice given to you by your health care provider. Make sure you discuss any questions you have with your health care provider.   Document Released: 06/09/2005 Document Revised: 05/21/2015 Document Reviewed: 11/15/2011 Elsevier Interactive Patient Education 2016 Elsevier Inc.  Edema Edema is an abnormal buildup of fluids in your bodytissues. Edema is somewhatdependent on gravity to pull the fluid to the lowest place in your body. That makes the condition more common in the legs and thighs (lower extremities). Painless swelling of the feet and ankles is common and becomes more likely as you get older. It is also common in looser tissues, like around your eyes.  When the affected area is squeezed, the fluid may move out of that spot and leave a dent for a few moments. This dent is called pitting.  CAUSES  There are many possible causes of edema. Eating too much salt and being on your feet or sitting for a long time can cause edema in your legs and ankles. Hot weather may make edema worse. Common medical causes of edema include:  Heart failure.  Liver disease.  Kidney disease.  Weak blood vessels in your legs.  Cancer.  An injury.  Pregnancy.  Some medications.  Obesity. SYMPTOMS  Edema is usually painless.Your skin  may look swollen or shiny.  DIAGNOSIS  Your health care provider may be able to diagnose edema by asking about your medical history and doing a physical exam. You may need to have tests such as X-rays, an electrocardiogram, or blood tests to check for medical conditions that may cause edema.  TREATMENT  Edema treatment depends on the cause. If you have heart, liver, or kidney disease, you need the treatment appropriate for these conditions. General treatment may  include:  Elevation of the affected body part above the level of your heart.  Compression of the affected body part. Pressure from elastic bandages or support stockings squeezes the tissues and forces fluid back into the blood vessels. This keeps fluid from entering the tissues.  Restriction of fluid and salt intake.  Use of a water pill (diuretic). These medications are appropriate only for some types of edema. They pull fluid out of your body and make you urinate more often. This gets rid of fluid and reduces swelling, but diuretics can have side effects. Only use diuretics as directed by your health care provider. HOME CARE INSTRUCTIONS   Keep the affected body part above the level of your heart when you are lying down.   Do not sit still or stand for prolonged periods.   Do not put anything directly under your knees when lying down.  Do not wear constricting clothing or garters on your upper legs.   Exercise your legs to work the fluid back into your blood vessels. This may help the swelling go down.   Wear elastic bandages or support stockings to reduce ankle swelling as directed by your health care provider.   Eat a low-salt diet to reduce fluid if your health care provider recommends it.   Only take medicines as directed by your health care provider. SEEK MEDICAL CARE IF:   Your edema is not responding to treatment.  You have heart, liver, or kidney disease and notice symptoms of edema.  You have edema in your legs that does not improve after elevating them.   You have sudden and unexplained weight gain. SEEK IMMEDIATE MEDICAL CARE IF:   You develop shortness of breath or chest pain.   You cannot breathe when you lie down.  You develop pain, redness, or warmth in the swollen areas.   You have heart, liver, or kidney disease and suddenly get edema.  You have a fever and your symptoms suddenly get worse. MAKE SURE YOU:   Understand these  instructions.  Will watch your condition.  Will get help right away if you are not doing well or get worse.   This information is not intended to replace advice given to you by your health care provider. Make sure you discuss any questions you have with your health care provider.   Document Released: 08/30/2005 Document Revised: 09/20/2014 Document Reviewed: 06/22/2013 Elsevier Interactive Patient Education [employer].

## 2015-12-29 NOTE — Progress Notes (Signed)
VASCULAR LAB PRELIMINARY  PRELIMINARY  PRELIMINARY  PRELIMINARY  Left lower extremity venous duplex completed.    Preliminary report:  Left:  No evidence of DVT, superficial thrombosis, or Baker's cyst.  Ammie Warrick, RVS 12/29/2015, 9:20 AM

## 2015-12-31 ENCOUNTER — Encounter: Payer: Self-pay | Admitting: Vascular Surgery

## 2016-01-07 ENCOUNTER — Encounter: Payer: Self-pay | Admitting: Vascular Surgery

## 2016-01-07 ENCOUNTER — Ambulatory Visit (INDEPENDENT_AMBULATORY_CARE_PROVIDER_SITE_OTHER): Payer: Medicare Other | Admitting: Vascular Surgery

## 2016-01-07 VITALS — BP 123/76 | HR 100 | Temp 100.3°F | Resp 22 | Ht 67.0 in | Wt 235.0 lb

## 2016-01-07 DIAGNOSIS — I872 Venous insufficiency (chronic) (peripheral): Secondary | ICD-10-CM

## 2016-01-07 DIAGNOSIS — I890 Lymphedema, not elsewhere classified: Secondary | ICD-10-CM | POA: Diagnosis not present

## 2016-01-07 MED ORDER — CEPHALEXIN 500 MG PO CAPS: 500.0000 mg | ORAL_CAPSULE | Freq: Four times a day (QID) | ORAL | Status: DC

## 2016-01-07 NOTE — Progress Notes (Signed)
[facility]  Patient name: Jessica Yates MRN: [id number] DOB: [birth year] Sex: female  REASON FOR VISIT: 1 year follow up.  HPI: Jessica Yates is a 65 y.o. female who I last saw in August 2014. She has a history of chronic lymphedema. He also has chronic venous insufficiency. At her last visit we recommended intermittent leg elevation and I discussed the proper positioning for this. In addition I wrote her a prescription for knee-high compression stockings with a gradient of 20-30 mmHg.  Since I saw her last, she was seen in the emergency department with increasing leg swelling and some erythema of her left leg. Duplex scan at that time showed no evidence of DVT. She was started on Keflex and Bactrim according to the patient.  He has had worsening bilateral lower extremity swelling. She has a history of chronic venous insufficiency and lymphedema.  Past Medical History  Diagnosis Date  . Vitamin D deficiency   . Seasonal allergies   . Cellulitis   . Asthma   . Cough   . Peripheral venous insufficiency   . DVT (deep venous thrombosis) ([place]) 2002    left leg  . Sickle cell trait ([place])   . Glaucoma   . Arthritis   . Anxiety   . Palpitations   . Blood clot in vein   . Depression 02/06/2008  . Lymphedema   . Anemia 2002    Family History  Problem Relation Age of Onset  . Allergies Mother   . Asthma Mother   . Other Mother     enlarged heart  . Arthritis Mother   . Breast cancer Mother   . Diabetes Mother   . Hypertension Mother   . Cancer Mother     breast  . Hyperlipidemia Mother   . Allergies Sister   . Heart disease Maternal Grandmother   . Breast cancer Maternal Grandmother   . Birth defects Maternal Grandmother     breast  . Arthritis Sister   . Cancer Father 31    leukemia    SOCIAL HISTORY: Social History  Substance Use Topics  . Smoking status: Never Smoker   . Smokeless tobacco: Never Used  . Alcohol Use: No     No Known Allergies  Current Outpatient Prescriptions  Medication Sig Dispense Refill  . acetaminophen (TYLENOL) 500 MG tablet Take 1,000 mg by mouth every 6 (six) hours as needed for moderate pain.    Marland Kitchen aspirin 81 MG tablet Take 81 mg by mouth daily.      . cephALEXin (KEFLEX) 500 MG capsule Take 1 capsule (500 mg total) by mouth 4 (four) times daily. 28 capsule 0  . Cholecalciferol (VITAMIN D3) 1000 UNITS CAPS Take 2,000 capsules by mouth daily.     Marland Kitchen Cod Liver Oil 1000 MG CAPS Take 1 capsule by mouth daily.     . meloxicam (MOBIC) 15 MG tablet Take 15 mg by mouth daily.    Marland Kitchen sulfamethoxazole-trimethoprim (BACTRIM DS,SEPTRA DS) 800-160 MG tablet Take 1 tablet by mouth 2 (two) times daily. 14 tablet 0  . TRAVATAN Z 0.004 % SOLN ophthalmic solution Place 1 drop into both eyes At bedtime.    . triamcinolone cream (KENALOG) 0.1 % Apply 1 application topically daily as needed (for irritation).     . vitamin E 1000 UNIT capsule Take 1,000 Units by mouth daily.      No current facility-administered medications for this visit.    REVIEW OF  SYSTEMS:  [X]  denotes positive finding, [ ]  denotes negative finding Cardiac  Comments:  Chest pain or chest pressure:    Shortness of breath upon exertion:    Short of breath when lying flat:    Irregular heart rhythm:        Vascular X   Pain in calf, thigh, or hip brought on by ambulation:    Pain in feet at night that wakes you up from your sleep:     Blood clot in your veins:    Leg swelling:         Pulmonary    Oxygen at home:    Productive cough:     Wheezing:         Neurologic    Sudden weakness in arms or legs:     Sudden numbness in arms or legs:     Sudden onset of difficulty speaking or slurred speech:    Temporary loss of vision in one eye:     Problems with dizziness:         Gastrointestinal    Blood in stool:     Vomited blood:         Genitourinary    Burning when urinating:     Blood in urine:         Psychiatric    Major depression:         Hematologic    Bleeding problems:    Problems with blood clotting too easily:        Skin    Rashes or ulcers:        Constitutional    Fever or chills:      PHYSICAL EXAM: Filed Vitals:   01/07/16 1632  BP: 123/76  Pulse: 100  Temp: 100.3 F (37.9 C)  TempSrc: Oral  Resp: 22  Height: 5\' 7"  (1.702 m)  Weight: 235 lb (106.595 kg)  SpO2: 98%    GENERAL: The patient is a well-nourished female, in no acute distress. The vital signs are documented above. CARDIAC: There is a regular rate and rhythm.  VASCULAR: I do not detect carotid bruits. He has palpable femoral and popliteal pulses bilaterally. I cannot palpate pedal pulses because of her leg swelling. She has brisk Doppler signals in the dorsalis pedis and posterior tibial positions bilaterally. She has massive bilateral lower extremity swelling which is more significant on the left side. PULMONARY: There is good air exchange bilaterally without wheezing or rales. ABDOMEN: Soft and non-tender with normal pitched bowel sounds.  MUSCULOSKELETAL: There are no major deformities or cyanosis. NEUROLOGIC: No focal weakness or paresthesias are detected. SKIN: She has hyperpigmentation bilaterally consistent with chronic lymphedema and chronic venous insufficiency. PSYCHIATRIC: The patient has a normal affect.  DATA:   VENOUS DUPLEX: The patient had a venous duplex scan in the emergency department on 12/29/2015. This showed no evidence of DVT in the left lower extremity.   MEDICAL ISSUES:  COMBINED CHRONIC VENOUS INSUFFICIENCY AND CHRONIC LYMPHEDEMA: This patient has progressive leg swelling secondary to chronic venous insufficiency and lymphedema. I have again reinforced this is a chronic problem that she will have for the rest of her life and needs to make elevation and compression part of her lifestyle. She does not elevate her legs consistently. I've explained that without taking  better care of her legs we will continue to enlarge and become problematic. I've explained infections like she had recently take much longer to treat because of poor circulation of the interstitial fluid  in the legs. We have had a long discussion today about the importance of intermittent leg elevation in the proper positioning for this. Once the swelling is better after probably a month of elevating her legs, then she could be fitted for knee-high stockings with a gradient of 20-30 mmHg. I have written her a prescription for this. I would be happy to refer her to a lymphedema clinic for massage therapy how her these points are very hard to get an at this point she's not interested in that. Have renewed her prescription for Keflex. I'll see her back in 1 year. Just call sooner if she has problems.    Deitra Mayo [facility] [place]: [phone]

## 2016-01-14 ENCOUNTER — Telehealth: Payer: Self-pay

## 2016-01-14 NOTE — Telephone Encounter (Signed)
Jessica Yates called in inquiring about message therapy through the lymphedema clinic also stated that the swelling has decreased in her L leg but has not completely resolved also asked if she should continue to sulfamethazole in addition to Keflex antibiotics. I spoke with Zigmund Daniel, triage nurse and called pt back and informed her to continue taking the Keflex only and she did not need to refill the sulfamethazole. I also let her know that the swelling was going to be an ongoing issue but to continue elevating her legs as much as possible. Information pertaining to the lymphedema referral will be forwarded to our front desk to be scheduled. I made patient aware of this process.

## 2016-04-19 ENCOUNTER — Encounter: Payer: Self-pay | Admitting: Gynecology

## 2016-04-19 ENCOUNTER — Ambulatory Visit (INDEPENDENT_AMBULATORY_CARE_PROVIDER_SITE_OTHER): Payer: Medicare Other | Admitting: Gynecology

## 2016-04-19 VITALS — BP 138/86 | Ht 67.0 in | Wt 219.0 lb

## 2016-04-19 DIAGNOSIS — Z780 Asymptomatic menopausal state: Secondary | ICD-10-CM | POA: Diagnosis not present

## 2016-04-19 DIAGNOSIS — Z01419 Encounter for gynecological examination (general) (routine) without abnormal findings: Secondary | ICD-10-CM | POA: Diagnosis not present

## 2016-04-19 DIAGNOSIS — Z86718 Personal history of other venous thrombosis and embolism: Secondary | ICD-10-CM

## 2016-04-19 DIAGNOSIS — N3281 Overactive bladder: Secondary | ICD-10-CM | POA: Diagnosis not present

## 2016-04-19 DIAGNOSIS — N907 Vulvar cyst: Secondary | ICD-10-CM | POA: Insufficient documentation

## 2016-04-19 DIAGNOSIS — L723 Sebaceous cyst: Secondary | ICD-10-CM | POA: Diagnosis not present

## 2016-04-19 MED ORDER — MIRABEGRON ER 25 MG PO TB24: 25.0000 mg | ORAL_TABLET | Freq: Every day | ORAL | 11 refills | Status: DC

## 2016-04-19 NOTE — Patient Instructions (Addendum)
Bone Densitometry Bone densitometry is an imaging test that uses a special X-ray to measure the amount of calcium and other minerals in your bones (bone density). This test is also known as a bone mineral density test or dual-energy X-ray absorptiometry (DXA). The test can measure bone density at your hip and your spine. It is similar to having a regular X-ray. You may have this test to:  Diagnose a condition that causes weak or thin bones (osteoporosis).  Predict your risk of a broken bone (fracture).  Determine how well osteoporosis treatment is working. LET [facility] CARE PROVIDER KNOW ABOUT:  Any allergies you have.  All medicines you are taking, including vitamins, herbs, eye drops, creams, and over-the-counter medicines.  Previous problems you or members of your family have had with the use of anesthetics.  Any blood disorders you have.  Previous surgeries you have had.  Medical conditions you have.  Possibility of pregnancy.  Any other medical test you had within the previous 14 days that used contrast material. RISKS AND COMPLICATIONS Generally, this is a safe procedure. However, problems can occur and may include the following:  This test exposes you to a very small amount of radiation.  The risks of radiation exposure may be greater to unborn children. BEFORE THE PROCEDURE  Do not take any calcium supplements for 24 hours before having the test. You can otherwise eat and drink what you usually do.  Take off all metal jewelry, eyeglasses, dental appliances, and any other metal objects. PROCEDURE  You may lie on an exam table. There will be an X-ray generator below you and an imaging device above you.  Other devices, such as boxes or braces, may be used to position your body properly for the scan.  You will need to lie still while the machine slowly scans your body.  The images will show up on a computer monitor. AFTER THE PROCEDURE You may need more testing  at a later time.   This information is not intended to replace advice given to you by your health care provider. Make sure you discuss any questions you have with your health care provider.   Document Released: 09/21/2004 Document Revised: 09/20/2014 Document Reviewed: 02/07/2014 Elsevier Interactive Patient Education 2016 [place].  Epidermal Cyst An epidermal cyst is sometimes called a sebaceous cyst, epidermal inclusion cyst, or infundibular cyst. These cysts usually contain a substance that looks "pasty" or "cheesy" and may have a bad smell. This substance is a protein called keratin. Epidermal cysts are usually found on the face, neck, or trunk. They may also occur in the vaginal area or other parts of the genitalia of both men and women. Epidermal cysts are usually small, painless, slow-growing bumps or lumps that move freely under the skin. It is important not to try to pop them. This may cause an infection and lead to tenderness and swelling. CAUSES  Epidermal cysts may be caused by a deep penetrating injury to the skin or a plugged hair follicle, often associated with acne. SYMPTOMS  Epidermal cysts can become inflamed and cause:  Redness.  Tenderness.  Increased temperature of the skin over the bumps or lumps.  Grayish-white, bad smelling material that drains from the bump or lump. DIAGNOSIS  Epidermal cysts are easily diagnosed by your caregiver during an exam. Rarely, a tissue sample (biopsy) may be taken to rule out other conditions that may resemble epidermal cysts. TREATMENT   Epidermal cysts often get better and disappear on their own.  They are rarely ever cancerous.  If a cyst becomes infected, it may become inflamed and tender. This may require opening and draining the cyst. Treatment with antibiotics may be necessary. When the infection is gone, the cyst may be removed with minor surgery.  Small, inflamed cysts can often be treated with antibiotics or by injecting  steroid medicines.  Sometimes, epidermal cysts become large and bothersome. If this happens, surgical removal in your caregiver's office may be necessary. HOME CARE INSTRUCTIONS  Only take over-the-counter or prescription medicines as directed by your caregiver.  Take your antibiotics as directed. Finish them even if you start to feel better. SEEK MEDICAL CARE IF:   Your cyst becomes tender, red, or swollen.  Your condition is not improving or is getting worse.  You have any other questions or concerns. MAKE SURE YOU:  Understand these instructions.  Will watch your condition.  Will get help right away if you are not doing well or get worse.   This information is not intended to replace advice given to you by your health care provider. Make sure you discuss any questions you have with your health care provider.   Document Released: 07/31/2004 Document Revised: 11/22/2011 Document Reviewed: 03/08/2011 Elsevier Interactive Patient Education 2016 [place] extended-release tablets What is this medicine? MIRABEGRON (MIR a BEG ron) is used to treat overactive bladder. This medicine reduces the amount of bathroom visits. It may also help to control wetting accidents. This medicine may be used for other purposes; ask your health care provider or pharmacist if you have questions. What should I tell my health care provider before I take this medicine? They need to know if you have any of these conditions: -difficulty passing urine -high blood pressure -kidney disease -liver disease -an unusual or allergic reaction to mirabegron, other medicines, foods, dyes, or preservatives -pregnant or trying to get pregnant -breast-feeding How should I use this medicine? Take this medicine by mouth with a glass of water. Follow the directions on the prescription label. Do not cut, crush or chew this medicine. You can take it with or without food. If it upsets your stomach, take it  with food. Take your medicine at regular intervals. Do not take it more often than directed. Do not stop taking except on your doctor's advice. Talk to your pediatrician regarding the use of this medicine in children. Special care may be needed. Overdosage: If you think you have taken too much of this medicine contact a poison control center or emergency room at once. NOTE: This medicine is only for you. Do not share this medicine with others. What if I miss a dose? If you miss a dose, take it as soon as you can. If it is almost time for your next dose, take only that dose. Do not take double or extra doses. What may interact with this medicine? -certain medicines for bladder problems like fesoterodine, oxybutynin, solifenacin, tolterodine -desipramine -digoxin -flecainide -ketoconazole -MAOIs like Carbex, Eldepryl, Marplan, Nardil, and Parnate -metoprolol -propafenone -thioridazine -warfarin This list may not describe all possible interactions. Give your health care provider a list of all the medicines, herbs, non-prescription drugs, or dietary supplements you use. Also tell them if you smoke, drink alcohol, or use illegal drugs. Some items may interact with your medicine. What should I watch for while using this medicine? It may take 8 weeks to notice the full benefit from this medicine. You may need to limit your intake tea, coffee, caffeinated sodas, and  alcohol. These drinks may make your symptoms worse. Visit your doctor or health care professional for regular checks on your progress. Check your blood pressure as directed. Ask your doctor or health care professional what your blood pressure should be and when you should contact him or her. What side effects may I notice from receiving this medicine? Side effects that you should report to your doctor or health care professional as soon as possible: -allergic reactions like skin rash, itching or hives, swelling of the face, lips, or  tongue -chest pain or palpitations -severe or sudden headache -high blood pressure -fast, irregular heartbeat -redness, blistering, peeling or loosening of the skin, including inside the mouth -signs of infection like fever or chills; cough; sore throat; pain or difficulty passing urine -trouble passing urine or change in the amount of urine Side effects that usually do not require medical attention (Report these to your doctor or health care professional if they continue or are bothersome.): -constipation -diarrhea -dizziness -dry eyes -joint pain -mild headache -nausea -runny nose This list may not describe all possible side effects. Call your doctor for medical advice about side effects. You may report side effects to FDA at 1-800-FDA-1088. Where should I keep my medicine? Keep out of the reach of children. Store at room temperature between 15 and 30 degrees C (59 and 86 degrees F). Throw away any unused medicine after the expiration date. NOTE: This sheet is a summary. It may not cover all possible information. If you have questions about this medicine, talk to your doctor, pharmacist, or health care provider.    2016, Elsevier/Gold Standard. (2015-05-01 10:22:20)

## 2016-04-19 NOTE — Progress Notes (Signed)
Jessica Yates 1950-12-02 [id number]   History:    65 y.o.  for annual gyn exam who is a new patient to the practice. She has not had a gynecological exam in several years. She was a patient of Dr. Ree Edman. Patient in 2002 had a total abdominal hysterectomy without removal of her ovaries as a result of symptomatic leiomyomatous uteri. She's complaining of going to the bathroom frequently at times and doesn't empty completely and she has to get up at night to urinate this is been going on for several years now she feels that is getting worse. Sometimes she may leak if she doesn't get there in time. Patient reports normal colonoscopy in 2011. She was also complaining of late left labia majora nodule that sometimes she expresses it and some pinkish at 19-year-old extrude from it she noticed it 2 weeks ago. She is not sexually active. She has not had a bone density study. Her last mammogram was over 5 years ago. She had never been on hormone replacement therapy except a young age on the oral contraceptive pill. She denies any prior history of any dysplasia prior to her hysterectomy or after.  Past medical history,surgical history, family history and social history were all reviewed and documented in the EPIC chart.  Gynecologic History No LMP recorded. Patient has had a hysterectomy. Contraception: status post hysterectomy Last Pap: Over 5 years ago. Results were: normal Last mammogram: Over 5 years ago. Results were: normal  Obstetric History OB History  Gravida Para Term Preterm AB Living  0 0 0 0 0 0  SAB TAB Ectopic Multiple Live Births  0 0 0 0 0         ROS: A ROS was performed and pertinent positives and negatives are included in the history.  GENERAL: No fevers or chills. HEENT: No change in vision, no earache, sore throat or sinus congestion. NECK: No pain or stiffness. CARDIOVASCULAR: No chest pain or pressure. No palpitations. PULMONARY: No shortness of breath, cough or wheeze.  GASTROINTESTINAL: No abdominal pain, nausea, vomiting or diarrhea, melena or bright red blood per rectum. GENITOURINARY: No urinary frequency, urgency, hesitancy or dysuria. MUSCULOSKELETAL: No joint or muscle pain, no back pain, no recent trauma. DERMATOLOGIC: No rash, no itching, no lesions. ENDOCRINE: No polyuria, polydipsia, no heat or cold intolerance. No recent change in weight. HEMATOLOGICAL: No anemia or easy bruising or bleeding. NEUROLOGIC: No headache, seizures, numbness, tingling or weakness. PSYCHIATRIC: No depression, no loss of interest in normal activity or change in sleep pattern.     Exam: chaperone present  BP 138/86   Ht 5\' 7"  (1.702 m)   Wt 219 lb (99.3 kg)   BMI 34.30 kg/m   Body mass index is 34.3 kg/m.  General appearance : Well developed well nourished female. No acute distress HEENT: Eyes: no retinal hemorrhage or exudates,  Neck supple, trachea midline, no carotid bruits, no thyroidmegaly Lungs: Clear to auscultation, no rhonchi or wheezes, or rib retractions  Heart: Regular rate and rhythm, no murmurs or gallops Breast:Examined in sitting and supine position were symmetrical in appearance, no palpable masses or tenderness,  no skin retraction, no nipple inversion, no nipple discharge, no skin discoloration, no axillary or supraclavicular lymphadenopathy Abdomen: no palpable masses or tenderness, no rebound or guarding Extremities: no edema or skin discoloration or tenderness  Pelvic:  Small left labia majora inferior sebaceous cyst  Bartholin, Urethra, Skene Glands: Within normal limits  Vagina: No gross lesions or discharge  Cervix: Absent  Uterus absent  Adnexa  Without masses or tenderness  Anus and perineum  normal   Rectovaginal  normal sphincter tone without palpated masses or tenderness             Hemoccult PCP provides     Assessment/Plan:  65 y.o. female for annual exam will have a Pap smear today so that we can have a record on  file. We did discuss the new guidelines that she will no longer need Pap smears after the age of 46 and also since she has had a hysterectomy. Her signs and symptoms are consistent with overactive bladder. She states that she does have a history of glaucoma we discussed the contraindication to anti-cholinergics. I'm going to prescribe her instead Myrbetriq extended release 25 mg daily weights selectively stimulates beta 3 adrenergic receptors relaxing her bladder smooth muscle. She has no history of hypertension. Literature information was provided on the medication. Patient was found to have a small left labia majora sebaceous cyst for which she can do sitz baths at home and apply Neosporin when necessary. She was given a requisition to schedule her mammogram. She was also instructed to schedule her bone density study here in the office which is overdue. We discussed importance of calcium vitamin D and weightbearing exercises for osteoporosis prevention. Her PCP we'll be doing her blood work.  15 additional minutes was spent with the patient going over her history and discussing treatment options for her overactive bladder.   Terrance Mass MD, 5:20 PM 04/19/2016

## 2016-04-20 LAB — PAP, TP IMAGING W/ HPV RNA, RFLX HPV TYPE 16,18/45: HPV MRNA, HIGH RISK: NOT DETECTED

## 2016-05-03 ENCOUNTER — Telehealth: Payer: Self-pay | Admitting: *Deleted

## 2016-05-03 NOTE — Telephone Encounter (Signed)
Pt informed with normal pap results on 04/19/16.

## 2016-05-07 ENCOUNTER — Other Ambulatory Visit (INDEPENDENT_AMBULATORY_CARE_PROVIDER_SITE_OTHER): Payer: Medicare Other

## 2016-05-07 ENCOUNTER — Encounter: Payer: Self-pay | Admitting: Internal Medicine

## 2016-05-07 ENCOUNTER — Ambulatory Visit (INDEPENDENT_AMBULATORY_CARE_PROVIDER_SITE_OTHER): Payer: Medicare Other | Admitting: Internal Medicine

## 2016-05-07 VITALS — BP 130/72 | HR 117 | Ht 67.0 in | Wt 210.0 lb

## 2016-05-07 DIAGNOSIS — R05 Cough: Secondary | ICD-10-CM | POA: Diagnosis not present

## 2016-05-07 DIAGNOSIS — R058 Other specified cough: Secondary | ICD-10-CM | POA: Insufficient documentation

## 2016-05-07 DIAGNOSIS — R059 Cough, unspecified: Secondary | ICD-10-CM

## 2016-05-07 LAB — CBC WITH DIFFERENTIAL/PLATELET
BASOS ABS: 0 10*3/uL (ref 0.0–0.1)
BASOS PCT: 0.6 % (ref 0.0–3.0)
EOS ABS: 0.1 10*3/uL (ref 0.0–0.7)
Eosinophils Relative: 1.2 % (ref 0.0–5.0)
HEMATOCRIT: 37.5 % (ref 36.0–46.0)
Hemoglobin: 12.5 g/dL (ref 12.0–15.0)
Lymphocytes Relative: 16 % (ref 12.0–46.0)
Lymphs Abs: 1.4 10*3/uL (ref 0.7–4.0)
MCHC: 33.4 g/dL (ref 30.0–36.0)
MCV: 79.3 fl (ref 78.0–100.0)
Monocytes Absolute: 0.6 10*3/uL (ref 0.1–1.0)
Monocytes Relative: 6.8 % (ref 3.0–12.0)
NEUTROS ABS: 6.5 10*3/uL (ref 1.4–7.7)
NEUTROS PCT: 75.4 % (ref 43.0–77.0)
PLATELETS: 229 10*3/uL (ref 150.0–400.0)
RBC: 4.74 Mil/uL (ref 3.87–5.11)
RDW: 17.1 % — AB (ref 11.5–15.5)
WBC: 8.6 10*3/uL (ref 4.0–10.5)

## 2016-05-07 LAB — NITRIC OXIDE: Nitric Oxide: 17

## 2016-05-07 MED ORDER — FAMOTIDINE 20 MG PO TABS: ORAL_TABLET | ORAL | 2 refills | Status: DC

## 2016-05-07 MED ORDER — PREDNISONE 10 MG PO TABS: ORAL_TABLET | ORAL | 0 refills | Status: DC

## 2016-05-07 MED ORDER — PANTOPRAZOLE SODIUM 40 MG PO TBEC: 40.0000 mg | DELAYED_RELEASE_TABLET | Freq: Every day | ORAL | 2 refills | Status: DC

## 2016-05-07 NOTE — Assessment & Plan Note (Addendum)
Max rx for gerd/ 1st gen h1 hs 05/07/2016 >>>  - Allergy profile 05/07/2016 >  Eos 0. /  IgE  Pending   The most common causes of chronic cough in immunocompetent adults include the following: upper airway cough syndrome (UACS), previously referred to as postnasal drip syndrome (PNDS), which is caused by variety of rhinosinus conditions; (2) asthma; (3) GERD; (4) chronic bronchitis from cigarette smoking or other inhaled environmental irritants; (5) nonasthmatic eosinophilic bronchitis; and (6) bronchiectasis.   These conditions, singly or in combination, have accounted for up to 94% of the causes of chronic cough in prospective studies.   Other conditions have constituted no >6% of the causes in prospective studies These have included bronchogenic carcinoma, chronic interstitial pneumonia, sarcoidosis, left ventricular failure, ACEI-induced cough, and aspiration from a condition associated with pharyngeal dysfunction.    Chronic cough is often simultaneously caused by more than one condition. A single cause has been found from 38 to 82% of the time, multiple causes from 18 to 62%. Multiply caused cough has been the result of three diseases up to 42% of the time.       Based on hx and exam, this is most likely:   Not cough variant  asthma but rather Upper airway cough syndrome, so named because it's frequently impossible to sort out how much is  CR/sinusitis with freq throat clearing (which can be related to primary GERD)   vs  causing  secondary (" extra esophageal")  GERD from wide swings in gastric pressure that occur with throat clearing, often  promoting self use of mint and menthol lozenges that reduce the lower esophageal sphincter tone and exacerbate the problem further in a cyclical fashion.   These are the same pts (now being labeled as having "irritable larynx syndrome" by some cough centers) who not infrequently have a history of having failed to tolerate ace inhibitors,  dry powder  inhalers or biphosphonates or report having atypical reflux symptoms that don't respond to standard doses of PPI , and are easily confused as having aecopd or asthma flares by even experienced allergists/ pulmonologists.   The first step is to maximize acid suppression and eliminate  pnds from the ddx with use of 1st gen H1 if tol then return to complete the cough algorithm if not responding to conservative rx but def will hold off empirical rx for asthma for now based on feno <25.  Total time devoted to counseling  = 35/37m review case with pt/ discussion of options/alternatives/ personally creating written instructions  in presence of pt  then going over those specific  Instructions directly with the pt including how to use all of the meds but in particular covering each new medication in detail and the difference between the maintenance/automatic meds and the prns using an action plan format for the latter.

## 2016-05-07 NOTE — Progress Notes (Signed)
Subjective:    Patient ID: Jessica Yates, female    DOB: 17-Jan-1951,    MRN: [id number]  HPI  3 yobf never smoker with  Onset in her 58s seasonal esp spring summer /early fall rhinitis rx by Dr Gerilyn Nestle on allergy shots for at least  one course and took breathing treatments and ? maint inhaler but doesn't know name (record indicates was advair 100 at one point around 2012)  with onset recurrent persist cough winter 2017 referred to pulmonary clinic 05/07/2016 by Dr Alphonzo Grieve at  [facility] clinic.   Note Dr Annamaria Boots saw pt 01/26/2011 with rec trial of symbicort and f/u pfts but did not return and doesn't recall whether any inhaler ever really helped her symptoms which did include cough per the notes in EPIC with reference (not in epic) to spirometry 2005 with mild reversible obstruction.    05/07/2016 1st [facility] office visit/ Jessica Yates   Chief Complaint  Patient presents with  . pulmonary consult    last seen CY in 2012. pt c/o dry cough at times prod with clear mucus & slight chest discomfort with coughing spells X59mo  typically coughs at hs but no that much noct and during the day sporadic x winter of 2017 typically dry, worse with voice use and not clearly correlating with rhinitis symptoms which are actually better than prev years when she didn't have the cough at all.  No obvious other patterns in day to day or daytime variabilty or assoc excess/ purulent sputum or mucus plugs  or cp or chest tightness, subjective wheeze overt sinus or hb symptoms. No unusual exp hx or h/o childhood pna/ asthma or knowledge of premature birth.  Sleeping ok without nocturnal  or early am exacerbation  of respiratory  c/o's or need for noct saba. Also denies any obvious fluctuation of symptoms with weather or environmental changes or other aggravating or alleviating factors except as outlined above   Current Medications, Allergies, Complete Past Medical History, Past Surgical History, Family History,  and Social History were reviewed in [employer] record.            Review of Systems  Constitutional: Negative for fever and unexpected weight change.  HENT: Positive for sneezing and sore throat. Negative for congestion, dental problem, ear pain, nosebleeds, postnasal drip, rhinorrhea, sinus pressure and trouble swallowing.   Eyes: Positive for itching. Negative for redness.  Respiratory: Positive for cough. Negative for chest tightness, shortness of breath and wheezing.   Cardiovascular: Positive for leg swelling. Negative for palpitations.  Gastrointestinal: Negative for nausea and vomiting.  Genitourinary: Negative for dysuria.  Musculoskeletal: Positive for joint swelling.  Skin: Positive for rash.  Neurological: Negative for headaches.  Hematological: Does not bruise/bleed easily.  Psychiatric/Behavioral: Negative for dysphoric mood. The patient is not nervous/anxious.        Objective:   Physical Exam  amb bf rambling historian  Wt Readings from Last 3 Encounters:  05/07/16 210 lb (95.3 kg)  04/19/16 219 lb (99.3 kg)  01/07/16 235 lb (106.6 kg)    Vital signs reviewed   HEENT:  Upper and lower partial dentulures, moderate bilateral non-specific turbinate edema     and  Nl oropharynx s pnd.. Nl external ear canals without cough reflex   NECK :  without JVD/Nodes/TM/ nl carotid upstrokes bilaterally   LUNGS: no acc muscle use,  Nl contour chest which is clear to A and P bilaterally without cough on insp or exp maneuvers  CV:  RRR  no s3 or murmur or increase in P2, 2+ bilateral lower ext edema Knees down  ABD:  soft and nontender with nl inspiratory excursion in the supine position. No bruits or organomegaly, bowel sounds nl  MS:  Nl gait/ ext warm without deformities, calf tenderness, cyanosis or clubbing No obvious joint restrictions   SKIN: warm and dry with hyperpigmented venous stasis changes both legs below the knee   NEURO:   alert, approp, nl sensorium with  no motor deficits     I personally reviewed images and agree with radiology impression as follows:  CT Chest   12/29/15  1. No pulmonary embolus. 2. Mild multi chamber cardiomegaly without pulmonary edema.      Labs ordered 05/07/2016 = cbc with diff/ allergy profile     Assessment & Plan:

## 2016-05-07 NOTE — Patient Instructions (Signed)
Prednisone 10 mg take  4 each am x 2 days,   2 each am x 2 days,  1 each am x 2 days and stop (this is to eliminate allergies and inflammation from coughing)  Protonix (pantoprazole) Take 30-60 min before first meal of the day and Pepcid 20 mg one bedtime plus chlorpheniramine 4 mg x 2 at bedtime (both available over the counter)  until cough is completely gone for at least a week without the need for cough suppression  GERD (REFLUX)  is an extremely common cause of respiratory symptoms, many times with no significant heartburn at all.    It can be treated with medication, but also with lifestyle changes including avoidance of late meals, excessive alcohol, smoking cessation, and avoid fatty foods, chocolate, peppermint, colas, red wine, and acidic juices such as orange juice.  NO MINT OR MENTHOL PRODUCTS SO NO COUGH DROPS   USE HARD CANDY INSTEAD (jolley ranchers or Stover's or Lifesavers (all available in sugarless versions) NO OIL BASED VITAMINS - use powdered substitutes.  Please remember to go to the lab   department downstairs for your tests - we will call you with the results when they are available.  Please schedule a follow up office visit in 6 weeks, call sooner if needed

## 2016-05-08 ENCOUNTER — Encounter: Payer: Self-pay | Admitting: Internal Medicine

## 2016-05-11 LAB — RESPIRATORY ALLERGY PROFILE REGION II ~~LOC~~
ALLERGEN, D PTERNOYSSINUS, D1: 1.57 kU/L — AB
Allergen, Cedar tree, t12: 0.6 kU/L — ABNORMAL HIGH
Allergen, Comm Silver Birch, t9: 0.16 kU/L — ABNORMAL HIGH
Allergen, Mouse Urine Protein, e78: <0.1 kU/L
Allergen, Mulberry, t76: <0.1 kU/L
Allergen, Oak,t7: 0.25 kU/L — ABNORMAL HIGH
Allergen, P. notatum, m1: <0.1 kU/L
Aspergillus fumigatus, m3: <0.1 kU/L
BERMUDA GRASS: 6.05 kU/L — AB
BOX ELDER: 0.47 kU/L — AB
Cockroach: 0.4 kU/L — ABNORMAL HIGH
Common Ragweed: 1.58 kU/L — ABNORMAL HIGH
D. FARINAE: 1.19 kU/L — AB
ELM IGE: 0.9 kU/L — AB
IgE (Immunoglobulin E), Serum: 192 kU/L — ABNORMAL HIGH (ref <115)
JOHNSON GRASS: 4.33 kU/L — AB
Pecan/Hickory Tree IgE: 3.82 kU/L — ABNORMAL HIGH
Rough Pigweed  IgE: <0.1 kU/L
SHEEP SORREL IGE: 0.2 kU/L — AB
Timothy Grass: 13 kU/L — ABNORMAL HIGH

## 2016-05-12 NOTE — Progress Notes (Signed)
LMTCB

## 2016-05-20 NOTE — Progress Notes (Signed)
lmtcb

## 2016-05-21 NOTE — Progress Notes (Signed)
lmtcb

## 2016-05-24 ENCOUNTER — Telehealth: Payer: Self-pay | Admitting: Internal Medicine

## 2016-05-24 NOTE — Telephone Encounter (Signed)
Notes Recorded by Tanda Rockers, MD on 05/12/2016 at 5:11 AM EDT Call patient : Studies are c/w allergies to Pos Grass / tree/ ragweed/ dust > may rx for now is avoidance, discuss details at f/u ov and ? Allergy referral then depending on progress --------------------------------------------------------------------------------------- Spoke with pt. She is aware of results. Nothing further was needed.

## 2016-05-24 NOTE — Progress Notes (Signed)
Lmtcb. Letter has been sent.

## 2016-06-18 ENCOUNTER — Ambulatory Visit: Payer: Medicare Other | Admitting: Internal Medicine

## 2016-06-23 ENCOUNTER — Ambulatory Visit: Payer: Medicare Other | Admitting: Internal Medicine

## 2016-12-29 ENCOUNTER — Encounter: Payer: Self-pay | Admitting: Vascular Surgery

## 2017-01-05 ENCOUNTER — Ambulatory Visit: Payer: Medicare Other | Admitting: Vascular Surgery

## 2017-01-26 ENCOUNTER — Encounter: Payer: Self-pay | Admitting: Gynecology

## 2017-01-28 ENCOUNTER — Encounter: Payer: Self-pay | Admitting: Vascular Surgery

## 2017-02-09 ENCOUNTER — Ambulatory Visit: Payer: Medicare Other | Admitting: Vascular Surgery

## 2018-05-08 DIAGNOSIS — H401131 Primary open-angle glaucoma, bilateral, mild stage: Secondary | ICD-10-CM | POA: Insufficient documentation

## 2018-07-22 ENCOUNTER — Emergency Department (HOSPITAL_COMMUNITY): Payer: Medicare Other

## 2018-07-22 ENCOUNTER — Encounter (HOSPITAL_COMMUNITY): Payer: Self-pay

## 2018-07-22 ENCOUNTER — Ambulatory Visit (HOSPITAL_COMMUNITY)
Admission: EM | Admit: 2018-07-22 | Discharge: 2018-07-22 | Disposition: A | Discharge status: Home / Self Care | Payer: Medicare Other | Source: Home / Self Care | Attending: Family Medicine | Admitting: Family Medicine

## 2018-07-22 ENCOUNTER — Other Ambulatory Visit: Payer: Self-pay

## 2018-07-22 ENCOUNTER — Inpatient Hospital Stay (HOSPITAL_COMMUNITY)
Admission: EM | Admit: 2018-07-22 | Discharge: 2018-07-28 | DRG: 602 | Disposition: A | Discharge status: Home / Self Care | Payer: Medicare Other | Attending: Internal Medicine | Admitting: Internal Medicine

## 2018-07-22 ENCOUNTER — Encounter (HOSPITAL_COMMUNITY): Payer: Self-pay | Admitting: Emergency Medicine

## 2018-07-22 DIAGNOSIS — Z791 Long term (current) use of non-steroidal anti-inflammatories (NSAID): Secondary | ICD-10-CM

## 2018-07-22 DIAGNOSIS — R600 Localized edema: Secondary | ICD-10-CM

## 2018-07-22 DIAGNOSIS — E559 Vitamin D deficiency, unspecified: Secondary | ICD-10-CM | POA: Diagnosis present

## 2018-07-22 DIAGNOSIS — D638 Anemia in other chronic diseases classified elsewhere: Secondary | ICD-10-CM | POA: Diagnosis present

## 2018-07-22 DIAGNOSIS — Z8349 Family history of other endocrine, nutritional and metabolic diseases: Secondary | ICD-10-CM

## 2018-07-22 DIAGNOSIS — N3281 Overactive bladder: Secondary | ICD-10-CM | POA: Diagnosis present

## 2018-07-22 DIAGNOSIS — H409 Unspecified glaucoma: Secondary | ICD-10-CM | POA: Diagnosis present

## 2018-07-22 DIAGNOSIS — Z79899 Other long term (current) drug therapy: Secondary | ICD-10-CM | POA: Diagnosis not present

## 2018-07-22 DIAGNOSIS — I5033 Acute on chronic diastolic (congestive) heart failure: Secondary | ICD-10-CM | POA: Diagnosis present

## 2018-07-22 DIAGNOSIS — Z8249 Family history of ischemic heart disease and other diseases of the circulatory system: Secondary | ICD-10-CM | POA: Diagnosis not present

## 2018-07-22 DIAGNOSIS — M7989 Other specified soft tissue disorders: Secondary | ICD-10-CM

## 2018-07-22 DIAGNOSIS — F329 Major depressive disorder, single episode, unspecified: Secondary | ICD-10-CM | POA: Diagnosis present

## 2018-07-22 DIAGNOSIS — R238 Other skin changes: Secondary | ICD-10-CM

## 2018-07-22 DIAGNOSIS — Z825 Family history of asthma and other chronic lower respiratory diseases: Secondary | ICD-10-CM

## 2018-07-22 DIAGNOSIS — Z86718 Personal history of other venous thrombosis and embolism: Secondary | ICD-10-CM | POA: Diagnosis not present

## 2018-07-22 DIAGNOSIS — Z803 Family history of malignant neoplasm of breast: Secondary | ICD-10-CM

## 2018-07-22 DIAGNOSIS — I890 Lymphedema, not elsewhere classified: Secondary | ICD-10-CM | POA: Diagnosis present

## 2018-07-22 DIAGNOSIS — L02416 Cutaneous abscess of left lower limb: Secondary | ICD-10-CM | POA: Diagnosis not present

## 2018-07-22 DIAGNOSIS — L03116 Cellulitis of left lower limb: Principal | ICD-10-CM | POA: Diagnosis present

## 2018-07-22 DIAGNOSIS — J4522 Mild intermittent asthma with status asthmaticus: Secondary | ICD-10-CM | POA: Diagnosis not present

## 2018-07-22 DIAGNOSIS — Z7982 Long term (current) use of aspirin: Secondary | ICD-10-CM | POA: Diagnosis not present

## 2018-07-22 DIAGNOSIS — D573 Sickle-cell trait: Secondary | ICD-10-CM | POA: Diagnosis present

## 2018-07-22 DIAGNOSIS — Z6835 Body mass index (BMI) 35.0-35.9, adult: Secondary | ICD-10-CM | POA: Diagnosis not present

## 2018-07-22 DIAGNOSIS — Z8261 Family history of arthritis: Secondary | ICD-10-CM | POA: Diagnosis not present

## 2018-07-22 DIAGNOSIS — Z806 Family history of leukemia: Secondary | ICD-10-CM

## 2018-07-22 DIAGNOSIS — J45909 Unspecified asthma, uncomplicated: Secondary | ICD-10-CM | POA: Diagnosis present

## 2018-07-22 DIAGNOSIS — R52 Pain, unspecified: Secondary | ICD-10-CM | POA: Diagnosis not present

## 2018-07-22 DIAGNOSIS — Z90710 Acquired absence of both cervix and uterus: Secondary | ICD-10-CM

## 2018-07-22 DIAGNOSIS — I509 Heart failure, unspecified: Secondary | ICD-10-CM

## 2018-07-22 DIAGNOSIS — Z833 Family history of diabetes mellitus: Secondary | ICD-10-CM

## 2018-07-22 DIAGNOSIS — I5030 Unspecified diastolic (congestive) heart failure: Secondary | ICD-10-CM | POA: Diagnosis not present

## 2018-07-22 DIAGNOSIS — I5031 Acute diastolic (congestive) heart failure: Secondary | ICD-10-CM | POA: Diagnosis not present

## 2018-07-22 LAB — CBC WITH DIFFERENTIAL/PLATELET
ABS IMMATURE GRANULOCYTES: 0.06 10*3/uL (ref 0.00–0.07)
Basophils Absolute: 0 10*3/uL (ref 0.0–0.1)
Basophils Relative: 0 %
Eosinophils Absolute: 0 10*3/uL (ref 0.0–0.5)
Eosinophils Relative: 0 %
HCT: 33.5 % — ABNORMAL LOW (ref 36.0–46.0)
HEMOGLOBIN: 10.3 g/dL — AB (ref 12.0–15.0)
Immature Granulocytes: 1 %
LYMPHS PCT: 12 %
Lymphs Abs: 1.1 10*3/uL (ref 0.7–4.0)
MCH: 25.6 pg — AB (ref 26.0–34.0)
MCHC: 30.7 g/dL (ref 30.0–36.0)
MCV: 83.1 fL (ref 80.0–100.0)
MONO ABS: 0.7 10*3/uL (ref 0.1–1.0)
MONOS PCT: 7 %
NEUTROS ABS: 7.9 10*3/uL — AB (ref 1.7–7.7)
Neutrophils Relative %: 80 %
Platelets: 199 10*3/uL (ref 150–400)
RBC: 4.03 MIL/uL (ref 3.87–5.11)
RDW: 14.7 % (ref 11.5–15.5)
WBC: 9.8 10*3/uL (ref 4.0–10.5)
nRBC: 0 % (ref 0.0–0.2)

## 2018-07-22 LAB — BASIC METABOLIC PANEL
Anion gap: 9 (ref 5–15)
BUN: 8 mg/dL (ref 8–23)
CO2: 26 mmol/L (ref 22–32)
Calcium: 10 mg/dL (ref 8.9–10.3)
Chloride: 103 mmol/L (ref 98–111)
Creatinine, Ser: 0.98 mg/dL (ref 0.44–1.00)
GFR calc Af Amer: >60 mL/min (ref >60)
GFR calc non Af Amer: 58 mL/min — ABNORMAL LOW (ref >60)
GLUCOSE: 136 mg/dL — AB (ref 70–99)
POTASSIUM: 3.6 mmol/L (ref 3.5–5.1)
Sodium: 138 mmol/L (ref 135–145)

## 2018-07-22 LAB — PROTIME-INR
INR: 0.92
Prothrombin Time: 12.3 s (ref 11.4–15.2)

## 2018-07-22 LAB — D-DIMER, QUANTITATIVE (NOT AT ARMC): D DIMER QUANT: 1.74 ug{FEU}/mL — AB (ref 0.00–0.50)

## 2018-07-22 LAB — I-STAT CG4 LACTIC ACID, ED: Lactic Acid, Venous: 1.12 mmol/L (ref 0.5–1.9)

## 2018-07-22 MED ORDER — ENOXAPARIN SODIUM 100 MG/ML ~~LOC~~ SOLN
1.0000 mg/kg | Freq: Once | SUBCUTANEOUS | Status: AC
  Administered 2018-07-23: 95 mg via SUBCUTANEOUS
  Filled 2018-07-22: qty 1
  Filled 2018-07-22: qty 0.95

## 2018-07-22 MED ORDER — ONDANSETRON HCL 4 MG PO TABS: 4.0000 mg | ORAL_TABLET | Freq: Four times a day (QID) | ORAL | Status: DC | PRN

## 2018-07-22 MED ORDER — CELECOXIB 100 MG PO CAPS
100.0000 mg | ORAL_CAPSULE | Freq: Two times a day (BID) | ORAL | Status: DC
  Administered 2018-07-24 – 2018-07-28 (×9): 100 mg via ORAL
  Filled 2018-07-22 (×13): qty 1

## 2018-07-22 MED ORDER — ASPIRIN EC 81 MG PO TBEC
81.0000 mg | DELAYED_RELEASE_TABLET | Freq: Every day | ORAL | Status: DC
  Administered 2018-07-23 – 2018-07-28 (×6): 81 mg via ORAL
  Filled 2018-07-22 (×6): qty 1

## 2018-07-22 MED ORDER — TURMERIC 1053 MG PO TABS: 1053.0000 mg | ORAL_TABLET | Freq: Every day | ORAL | Status: DC

## 2018-07-22 MED ORDER — POTASSIUM CHLORIDE CRYS ER 20 MEQ PO TBCR
20.0000 meq | EXTENDED_RELEASE_TABLET | Freq: Every day | ORAL | Status: DC
  Administered 2018-07-23: 20 meq via ORAL
  Filled 2018-07-22: qty 1

## 2018-07-22 MED ORDER — ENOXAPARIN SODIUM 100 MG/ML ~~LOC~~ SOLN: 1.0000 mg/kg | Freq: Two times a day (BID) | SUBCUTANEOUS | Status: DC

## 2018-07-22 MED ORDER — ONDANSETRON HCL 4 MG/2ML IJ SOLN: 4.0000 mg | Freq: Four times a day (QID) | INTRAMUSCULAR | Status: DC | PRN

## 2018-07-22 MED ORDER — CEFAZOLIN SODIUM-DEXTROSE 1-4 GM/50ML-% IV SOLN
1.0000 g | Freq: Once | INTRAVENOUS | Status: AC
  Administered 2018-07-22: 1 g via INTRAVENOUS
  Filled 2018-07-22: qty 50

## 2018-07-22 MED ORDER — TRIAMCINOLONE ACETONIDE 0.1 % EX CREA
1.0000 "application " | TOPICAL_CREAM | Freq: Every day | CUTANEOUS | Status: DC | PRN
  Filled 2018-07-22: qty 15

## 2018-07-22 MED ORDER — ENOXAPARIN SODIUM 100 MG/ML ~~LOC~~ SOLN
1.0000 mg/kg | Freq: Two times a day (BID) | SUBCUTANEOUS | Status: DC
  Filled 2018-07-22: qty 0.95

## 2018-07-22 MED ORDER — IPRATROPIUM BROMIDE 0.06 % NA SOLN
2.0000 | Freq: Every day | NASAL | Status: DC
  Administered 2018-07-23 – 2018-07-28 (×4): 2 via NASAL
  Filled 2018-07-22: qty 15

## 2018-07-22 MED ORDER — ACETAMINOPHEN 500 MG PO TABS
1000.0000 mg | ORAL_TABLET | Freq: Four times a day (QID) | ORAL | Status: DC | PRN
  Administered 2018-07-23 (×3): 1000 mg via ORAL
  Filled 2018-07-22 (×5): qty 2

## 2018-07-22 MED ORDER — FUROSEMIDE 40 MG PO TABS: 40.0000 mg | ORAL_TABLET | Freq: Every day | ORAL | Status: DC | PRN

## 2018-07-22 MED ORDER — METOLAZONE 2.5 MG PO TABS
2.5000 mg | ORAL_TABLET | Freq: Every day | ORAL | Status: DC | PRN
  Filled 2018-07-22: qty 1

## 2018-07-22 MED ORDER — PANTOPRAZOLE SODIUM 40 MG PO TBEC
40.0000 mg | DELAYED_RELEASE_TABLET | Freq: Every day | ORAL | Status: DC
  Administered 2018-07-23 – 2018-07-28 (×6): 40 mg via ORAL
  Filled 2018-07-22 (×7): qty 1

## 2018-07-22 MED ORDER — CEFAZOLIN SODIUM-DEXTROSE 1-4 GM/50ML-% IV SOLN
1.0000 g | Freq: Three times a day (TID) | INTRAVENOUS | Status: DC
  Administered 2018-07-23 – 2018-07-27 (×13): 1 g via INTRAVENOUS
  Filled 2018-07-22 (×14): qty 50

## 2018-07-22 MED ORDER — MIRABEGRON ER 25 MG PO TB24
25.0000 mg | ORAL_TABLET | Freq: Every day | ORAL | Status: DC
  Administered 2018-07-23 – 2018-07-28 (×6): 25 mg via ORAL
  Filled 2018-07-22 (×7): qty 1

## 2018-07-22 MED ORDER — LATANOPROST 0.005 % OP SOLN
1.0000 [drp] | Freq: Every day | OPHTHALMIC | Status: DC
  Administered 2018-07-23 – 2018-07-27 (×6): 1 [drp] via OPHTHALMIC
  Filled 2018-07-22: qty 2.5

## 2018-07-22 NOTE — ED Notes (Signed)
Per Pharmacy tech pt is requesting home Travatan Z opth drops and Prilosec to be ordered for her.  Sent Dr. Jonelle Sidle a text requesting.

## 2018-07-22 NOTE — ED Triage Notes (Signed)
Sent from u/c to r/o blood clot in left leg.  Onset 2 days ago LLE swollen more than usual, red and draining fluid.  Both legs swollen L>R.  Several blisters noted to LLE.

## 2018-07-22 NOTE — ED Provider Notes (Signed)
[facility] EMERGENCY DEPARTMENT Provider Note   CSN: [id number] Arrival date & time: 07/22/18  1909     History   Chief Complaint Chief Complaint  Patient presents with  . Leg Swelling    HPI Jessica Yates is a 67 y.o. female.  HPI  66 year old female with history of lymphedema, DVT, anemia, anxiety, sickle cell trait presents today complaining of increasing swelling of the left leg with erythema and blistering.  States symptoms began about 2 days ago.  She was seen at urgent care and sent here to evaluate for DVT versus cellulitis.  She endorses that she has had some chills but no fever.  It is somewhat painful.  She has a history of DVT back in 2002 and is not currently on anticoagulation.  She has not had any other recent propagating factors for DVT such as immobilization, long travel, or injury she denies chest pain, shortness of breath, nausea, vomiting, or diarrhea  Past Medical History:  Diagnosis Date  . Anemia 2002  . Anxiety   . Arthritis   . Asthma   . Blood clot in vein   . Cellulitis   . Cough   . Depression 02/06/2008  . DVT (deep venous thrombosis) ([place]) 2002   left leg  . Glaucoma   . Lymphedema   . Palpitations   . Peripheral venous insufficiency   . Seasonal allergies   . Sickle cell trait ([place])   . Vitamin D deficiency     Patient Active Problem List   Diagnosis Date Noted  . Cough 05/07/2016  . Upper airway cough syndrome 05/07/2016  . Sebaceous cyst of labia 04/19/2016  . OAB (overactive bladder) 04/19/2016  . 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 Lymphedema 05/03/2012  . CHF (congestive heart failure) ([place]) 04/16/2011  . SICKLE-CELL TRAIT 08/01/2007  . UNSPECIFIED GLAUCOMA 08/01/2007  . DVT 08/01/2007  . VENOUS INSUFFICIENCY 08/01/2007  . ALLERGIC RHINITIS 08/01/2007  . Extrinsic asthma, unspecified 08/01/2007    Past Surgical History:  Procedure Laterality Date  . COSMETIC SURGERY   1975   nasal reconstruction  . TOTAL ABDOMINAL HYSTERECTOMY  07/2001     OB History    Gravida  0   Para  0   Term  0   Preterm  0   AB  0   Living  0     SAB  0   TAB  0   Ectopic  0   Multiple  0   Live Births  0            Home Medications    Prior to Admission medications   Medication Sig Start Date End Date Taking? Authorizing Provider  acetaminophen (TYLENOL) 500 MG tablet Take 1,000 mg by mouth every 6 (six) hours as needed for moderate pain.    [provider]  aspirin 81 MG tablet Take 81 mg by mouth daily.      [provider]  celecoxib (CELEBREX) 100 MG capsule Take 100 mg by mouth 2 (two) times daily.    [provider]  Cholecalciferol (VITAMIN D3) 1000 UNITS CAPS Take 2,000 capsules by mouth daily.     [provider]  famotidine (PEPCID) 20 MG tablet One at bedtime Patient not taking: Reported on 07/22/2018 05/07/16   Tanda Rockers, MD  furosemide (LASIX) 40 MG tablet Take 40 mg by mouth.    [provider]  meloxicam (MOBIC) 15 MG tablet Take 15  mg by mouth daily.    [provider]  metolazone (ZAROXOLYN) 2.5 MG tablet  04/27/16   [provider]  mirabegron ER (MYRBETRIQ) 25 MG TB24 tablet Take 1 tablet (25 mg total) by mouth daily. Patient not taking: Reported on 07/22/2018 04/19/16   Terrance Mass, MD  omeprazole (PRILOSEC) 20 MG capsule Take 20 mg by mouth daily.    [provider]  pantoprazole (PROTONIX) 40 MG tablet Take 1 tablet (40 mg total) by mouth daily. Take 30-60 min before first meal of the day Patient not taking: Reported on 07/22/2018 05/07/16   Tanda Rockers, MD  potassium chloride SA (K-DUR,KLOR-CON) 20 MEQ tablet  04/27/16   [provider]  predniSONE (DELTASONE) 10 MG tablet Take  4 each am x 2 days,   2 each am x 2 days,  1 each am x 2 days and stop Patient not taking: Reported on 07/22/2018 05/07/16   Tanda Rockers, MD  promethazine-codeine  (PHENERGAN WITH CODEINE) 6.25-10 MG/5ML syrup Take by mouth every 6 (six) hours as needed for cough.    [provider]  TRAVATAN Z 0.004 % SOLN ophthalmic solution Place 1 drop into both eyes At bedtime. 03/14/11   [provider]  triamcinolone cream (KENALOG) 0.1 % Apply 1 application topically daily as needed (for irritation).     [provider]  Turmeric 1053 MG TABS Take by mouth.    [provider]    Family History Family History  Problem Relation Age of Onset  . Allergies Mother   . Asthma Mother   . Other Mother        enlarged heart  . Arthritis Mother   . Breast cancer Mother   . Diabetes Mother   . Hypertension Mother   . Cancer Mother        breast  . Hyperlipidemia Mother   . Heart disease Maternal Grandmother   . Breast cancer Maternal Grandmother   . Birth defects Maternal Grandmother        breast  . Cancer Father 59       leukemia  . Allergies Sister   . Arthritis Sister     Social History Social History   Tobacco Use  . Smoking status: Never Smoker  . Smokeless tobacco: Never Used  Substance Use Topics  . Alcohol use: No  . Drug use: No     Allergies   Patient has no known allergies.   Review of Systems Review of Systems  All other systems reviewed and are negative.    Physical Exam Updated Vital Signs BP 122/82   Pulse 96   Temp 98.4 F (36.9 C) (Oral)   Resp 16   SpO2 100%   Physical Exam  Constitutional: She is oriented to person, place, and time. She appears well-developed and well-nourished.  HENT:  Head: Normocephalic and atraumatic.  Right Ear: External ear normal.  Left Ear: External ear normal.  Nose: Nose normal.  Mouth/Throat: Oropharynx is clear and moist.  Eyes: Pupils are equal, round, and reactive to light. EOM are normal.  Neck: Normal range of motion. Neck supple.  Cardiovascular: Tachycardia present.  Pulmonary/Chest: Effort normal and breath sounds normal.  Abdominal:  Soft. Bowel sounds are normal.  Musculoskeletal: Normal range of motion. She exhibits edema.  Bilateral lower extremity edema with increased swelling of left versus right.  There is erythema from the dorsum of the left foot up to the knee.  There is some  blistering on the anterior aspect of the leg noted.  No obvious injury or skin abnormality noted of toes, foot, or lower leg No evidence of cord noted in the medial aspect of leg.  Negative Homans sign dorsal pedalis pulses are intact  Neurological: She is alert and oriented to person, place, and time. She displays normal reflexes. No cranial nerve deficit. Coordination normal.  Skin: Skin is warm and dry. Capillary refill takes less than 2 seconds.  Psychiatric: She has a normal mood and affect.  Nursing note and vitals reviewed.    ED Treatments / Results  Labs (all labs ordered are listed, but only abnormal results are displayed) Labs Reviewed  CULTURE, BLOOD (ROUTINE X 2)  CULTURE, BLOOD (ROUTINE X 2)  CBC WITH DIFFERENTIAL/PLATELET  BASIC METABOLIC PANEL  PROTIME-INR  D-DIMER, QUANTITATIVE (NOT AT [facility])  I-STAT CG4 LACTIC ACID, ED    EKG None  Radiology No results found.  Procedures Procedures (including critical care time)  Medications Ordered in ED Medications  ceFAZolin (ANCEF) IVPB 1 g/50 mL premix (has no administration in time range)     Initial Impression / Assessment and Plan / ED Course  I have reviewed the triage vital signs and the nursing notes.  Pertinent labs & imaging results that were available during my care of the patient were reviewed by me and considered in my medical decision making (see chart for details).    This 67 year old female with chronic lymphedema presents today with likely cellulitis.  She is treated here empirically with Ancef.  Blood cultures have been obtained.  Given patient's clinical history and significant swelling, cannot rule out DVT.  Will anticoagulate tonight and plan  Doppler for morning  D-dimer elevated at 1.74 Anemia with hgb at 10.3-decreased to 12.5 but has been lower at 9.1 12/28/15  Discussed with Dr. Jonelle Sidle and he will see for admission  Final Clinical Impressions(s) / ED Diagnoses   Final diagnoses:  Cellulitis of left lower extremity  Leg edema    ED Discharge Orders    None       Pattricia Boss, MD 07/22/18 2059

## 2018-07-22 NOTE — H&P (Signed)
History and Physical   Jessica Yates NFA:[id number] DOB: 03-Nov-1950 DOA: 07/22/2018  Referring MD/NP/PA: Dr. Jeanell Sparrow  PCP: Alphonzo Grieve Ralene Bathe, MD   Outpatient Specialists: None  Patient coming from: Home  Chief Complaint: Lower extremity swelling and pain  HPI: Jessica Jessica Yates is a 67 y.o. female with medical history significant of lymphedema, previous DVT, asthma, previous cellulitis, depression, anxiety and morbid obesity who presented to the ER with bilateral lower extremity swelling left more than right.  She first went to urgent care center where they were worried about possible blood clot and sent the patient over to the hospital for evaluation.  Patient reported increasing swelling in the left leg within the last 2 days.  There is erythema blistering and oozing of fluids.  Patient has had some chills no fever.  She has decreased mobility.  Has previous DVT back in 2002 and completed anticoagulation.  Patient is evaluated and being admitted to evaluate for possible DVT although's her left lower extremity appeared to be more cellulitis.  ED Course: Temperature is 98.4 blood pressure 133/73 pulse 106 respiratory rate of 27 oxygen sat 100% room air.  White count 9.8 with hemoglobin 10.3 and platelet 199.  Electrolytes essentially normal with an 0.91 glucose 136 lactic acid 1.12.  Chest x-ray showed no active disease.  Patient initiated on IV antibiotics and Doppler ultrasound ordered.  She is being admitted for evaluation and treatment. Review of Systems: As per HPI otherwise 10 point review of systems negative.    Past Medical History:  Diagnosis Date  . Anemia 2002  . Anxiety   . Arthritis   . Asthma   . Blood clot in vein   . Cellulitis   . Cough   . Depression 02/06/2008  . DVT (deep venous thrombosis) ([place]) 2002   left leg  . Glaucoma   . Lymphedema   . Palpitations   . Peripheral venous insufficiency   . Seasonal allergies   . Sickle cell trait ([place])   . Vitamin D  deficiency     Past Surgical History:  Procedure Laterality Date  . COSMETIC SURGERY  1975   nasal reconstruction  . TOTAL ABDOMINAL HYSTERECTOMY  07/2001     reports that she has never smoked. She has never used smokeless tobacco. She reports that she does not drink alcohol or use drugs.  No Known Allergies  Family History  Problem Relation Age of Onset  . Allergies Mother   . Asthma Mother   . Other Mother        enlarged heart  . Arthritis Mother   . Breast cancer Mother   . Diabetes Mother   . Hypertension Mother   . Cancer Mother        breast  . Hyperlipidemia Mother   . Heart disease Maternal Grandmother   . Breast cancer Maternal Grandmother   . Birth defects Maternal Grandmother        breast  . Cancer Father 74       leukemia  . Allergies Sister   . Arthritis Sister      Prior to Admission medications   Medication Sig Start Date End Date Taking? Authorizing Provider  acetaminophen (TYLENOL) 500 MG tablet Take 1,000 mg by mouth every 6 (six) hours as needed for moderate pain.    [provider]  aspirin 81 MG tablet Take 81 mg by mouth daily.      [provider]  celecoxib (CELEBREX) 100 MG capsule Take 100  mg by mouth 2 (two) times daily.    [provider]  Cholecalciferol (VITAMIN D3) 1000 UNITS CAPS Take 2,000 capsules by mouth daily.     [provider]  famotidine (PEPCID) 20 MG tablet One at bedtime Patient not taking: Reported on 07/22/2018 05/07/16   Tanda Rockers, MD  furosemide (LASIX) 40 MG tablet Take 40 mg by mouth.    [provider]  meloxicam (MOBIC) 15 MG tablet Take 15 mg by mouth daily.    [provider]  metolazone (ZAROXOLYN) 2.5 MG tablet  04/27/16   [provider]  mirabegron ER (MYRBETRIQ) 25 MG TB24 tablet Take 1 tablet (25 mg total) by mouth daily. Patient not taking: Reported on 07/22/2018 04/19/16   Terrance Mass, MD  omeprazole (PRILOSEC) 20 MG capsule Take 20 mg  by mouth daily.    [provider]  pantoprazole (PROTONIX) 40 MG tablet Take 1 tablet (40 mg total) by mouth daily. Take 30-60 min before first meal of the day Patient not taking: Reported on 07/22/2018 05/07/16   Tanda Rockers, MD  potassium chloride SA (K-DUR,KLOR-CON) 20 MEQ tablet  04/27/16   [provider]  predniSONE (DELTASONE) 10 MG tablet Take  4 each am x 2 days,   2 each am x 2 days,  1 each am x 2 days and stop Patient not taking: Reported on 07/22/2018 05/07/16   Tanda Rockers, MD  promethazine-codeine (PHENERGAN WITH CODEINE) 6.25-10 MG/5ML syrup Take by mouth every 6 (six) hours as needed for cough.    [provider]  TRAVATAN Z 0.004 % SOLN ophthalmic solution Place 1 drop into both eyes At bedtime. 03/14/11   [provider]  triamcinolone cream (KENALOG) 0.1 % Apply 1 application topically daily as needed (for irritation).     [provider]  Turmeric 1053 MG TABS Take by mouth.    [provider]    Physical Exam: Vitals:   07/22/18 1930 07/22/18 2030 07/22/18 2100 07/22/18 2130  BP: 123/84 122/76 125/78 127/74  Pulse: 98 97 (!) 104 (!) 106  Resp: 19 (!) 22 (!) 27 (!) 26  Temp:      TempSrc:      SpO2: 99% 96%  98%  Weight: 95.3 kg         Constitutional: NAD, calm, comfortable Vitals:   07/22/18 1930 07/22/18 2030 07/22/18 2100 07/22/18 2130  BP: 123/84 122/76 125/78 127/74  Pulse: 98 97 (!) 104 (!) 106  Resp: 19 (!) 22 (!) 27 (!) 26  Temp:      TempSrc:      SpO2: 99% 96%  98%  Weight: 95.3 kg      Eyes: PERRL, lids and conjunctivae normal ENMT: Mucous membranes are moist. Posterior pharynx clear of any exudate or lesions.Normal dentition.  Neck: normal, supple, no masses, no thyromegaly Respiratory: clear to auscultation bilaterally, no wheezing, no crackles. Normal respiratory effort. No accessory muscle use.  Cardiovascular: Regular rate and rhythm, no murmurs / rubs / gallops. 3+ extremity edema.  2+ pedal pulses. No carotid bruits.  Abdomen: no tenderness, no masses palpated. No hepatosplenomegaly. Bowel sounds positive.  Musculoskeletal: Significant bilateral lymphedema left more than the right.  No clubbing / cyanosis. No joint deformity upper and lower extremities. Good ROM, no contractures. Normal muscle tone.  Skin: Chronic stasis dermatitis with increased redness and blisters on the left lower extremity. Neurologic: CN 2-12 grossly intact. Sensation intact, DTR normal. Strength 5/5 in all  4.  Psychiatric: Normal judgment and insight. Alert and oriented x 3. Normal mood.     Labs on Admission: I have personally reviewed following labs and imaging studies  CBC: Recent Labs  Lab 07/22/18 1940  WBC 9.8  NEUTROABS 7.9*  HGB 10.3*  HCT 33.5*  MCV 83.1  PLT 027   Basic Metabolic Panel: Recent Labs  Lab 07/22/18 1940  NA 138  K 3.6  CL 103  CO2 26  GLUCOSE 136*  BUN 8  CREATININE 0.98  CALCIUM 10.0   GFR: CrCl cannot be calculated (Unknown ideal weight.). Liver Function Tests: No results for input(s): AST, ALT, ALKPHOS, BILITOT, PROT, ALBUMIN in the last 168 hours. No results for input(s): LIPASE, AMYLASE in the last 168 hours. No results for input(s): AMMONIA in the last 168 hours. Coagulation Profile: Recent Labs  Lab 07/22/18 1940  INR 0.92   Cardiac Enzymes: No results for input(s): CKTOTAL, CKMB, CKMBINDEX, TROPONINI in the last 168 hours. BNP (last 3 results) No results for input(s): PROBNP in the last 8760 hours. HbA1C: No results for input(s): HGBA1C in the last 72 hours. CBG: No results for input(s): GLUCAP in the last 168 hours. Lipid Profile: No results for input(s): CHOL, HDL, LDLCALC, TRIG, CHOLHDL, LDLDIRECT in the last 72 hours. Thyroid Function Tests: No results for input(s): TSH, T4TOTAL, FREET4, T3FREE, THYROIDAB in the last 72 hours. Anemia Panel: No results for input(s): VITAMINB12, FOLATE, FERRITIN, TIBC, IRON, RETICCTPCT in the  last 72 hours. Urine analysis: No results found for: COLORURINE, APPEARANCEUR, LABSPEC, PHURINE, GLUCOSEU, HGBUR, BILIRUBINUR, KETONESUR, PROTEINUR, UROBILINOGEN, NITRITE, LEUKOCYTESUR Sepsis Labs: @LABRCNTIP (procalcitonin:4,lacticidven:4) )No results found for this or any previous visit (from the past 240 hour(s)).   Radiological Exams on Admission: Dg Chest Port 1 View  Result Date: 07/22/2018 CLINICAL DATA:  Chest pain, shortness of breath. EXAM: PORTABLE CHEST 1 VIEW COMPARISON:  Radiographs of March 27, 2011. FINDINGS: Stable cardiomegaly. No pneumothorax or pleural effusion is noted. Both lungs are clear. The visualized skeletal structures are unremarkable. IMPRESSION: No active disease. Electronically Signed   By: Marijo Conception, M.D.   On: 07/22/2018 19:49    EKG: Independently reviewed.  It shows sinus rhythm with a rate of 92.  No significant ST changes.  Assessment/Plan Principal Problem:   Cellulitis and abscess of left leg Active Problems:   Extrinsic asthma   CHF (congestive heart failure) (HCC)   Lymphedema     #1 left lower extremity cellulitis versus DVT.:  Patient will be initiated empirically on Ancef.  Also initiate heparin IV while waiting for Doppler ultrasound.  With past history of DVT this could be DVT.  It is red warm and swollen also could be cellulitis.  Blood cultures obtained.  Follow closely.  #2 possible DVT: With past history of DVT.  Again continue with empiric heparin.  If negative for DVT we will stop and convert to prophylactic dose.  #3 CHF: Appears to be diastolic dysfunction.  No recent echo.  Continue home regimen.  #4 history of asthma: No significant wheezing.  Monitor and continue home treatment.  #5 lymphedema: Chronic lymphedema.  Patient not being followed at the clinic.  Counseled patient to follow-up with lymphedema clinic.  DVT prophylaxis: Heparin Code Status: Full code Family Communication: Sister who is with patient in the  room Disposition Plan: Home Consults called: None Admission status: Inpatient  Severity of Illness: The appropriate patient status for this patient is INPATIENT. Inpatient status is judged to be reasonable and necessary  in order to provide the required intensity of service to ensure the patient's safety. The patient's presenting symptoms, physical exam findings, and initial radiographic and laboratory data in the context of their chronic comorbidities is felt to place them at high risk for further clinical deterioration. Furthermore, it is not anticipated that the patient will be medically stable for discharge from the hospital within 2 midnights of admission. The following factors support the patient status of inpatient.   " The patient's presenting symptoms include lower extremity swelling pain and redness. " The worrisome physical exam findings include significant lymphedema with worsening inflammation and swelling of the left leg. " The initial radiographic and laboratory data are worrisome because of Doppler ultrasound currently pending. " The chronic co-morbidities include history of lymphedema.   * I certify that at the point of admission it is my clinical judgment that the patient will require inpatient hospital care spanning beyond 2 midnights from the point of admission due to high intensity of service, high risk for further deterioration and high frequency of surveillance required.Barbette Merino MD [facility] Pager [phone]  If 7PM-7AM, please contact night-coverage [email or web address] Password [email or web address]  07/22/2018, 10:01 PM

## 2018-07-22 NOTE — ED Triage Notes (Signed)
Pt hx of lymphedema c/o bilateral leg swelling, pt L leg is very swollen, tender, pt concerned for cellulitis.

## 2018-07-22 NOTE — ED Notes (Signed)
Pt given turkey sandwich and cranberry juice. 

## 2018-07-22 NOTE — Progress Notes (Signed)
PHARMACIST - PHYSICIAN ORDER COMMUNICATION ° °CONCERNING: P&T Medication Policy on Herbal Medications ° °DESCRIPTION:  This patient’s order for:  Turmeric  has been noted. ° °This product(s) is classified as an “herbal” or natural product. °Due to a lack of definitive safety studies or FDA approval, nonstandard manufacturing practices, plus the potential risk of unknown drug-drug interactions while on inpatient medications, the Pharmacy and Therapeutics Committee does not permit the use of “herbal” or natural products of this type within [place]. °  °ACTION TAKEN: °The pharmacy department is unable to verify this order at this time and your patient has been informed of this safety policy. °Please reevaluate patient’s clinical condition at discharge and address if the herbal or natural product(s) should be resumed at that time. ° °

## 2018-07-22 NOTE — Progress Notes (Signed)
ANTICOAGULATION CONSULT NOTE - Initial Consult  Pharmacy Consult for enoxaparin Indication: DVT  No Known Allergies  Patient Measurements: Weight: 210 lb (95.3 kg) Heparin Dosing Weight: 95kg  Vital Signs: Temp: 98.4 F (36.9 C) (11/09 1922) Temp Source: Oral (11/09 1922) BP: 125/78 (11/09 2100) Pulse Rate: 104 (11/09 2100)  Labs: Recent Labs    07/22/18 1940  HGB 10.3*  HCT 33.5*  PLT 199  LABPROT 12.3  INR 0.92  CREATININE 0.98    CrCl cannot be calculated (Unknown ideal weight.).   Medical History: Past Medical History:  Diagnosis Date  . Anemia 2002  . Anxiety   . Arthritis   . Asthma   . Blood clot in vein   . Cellulitis   . Cough   . Depression 02/06/2008  . DVT (deep venous thrombosis) ([place]) 2002   left leg  . Glaucoma   . Lymphedema   . Palpitations   . Peripheral venous insufficiency   . Seasonal allergies   . Sickle cell trait ([place])   . Vitamin D deficiency      Assessment: 37 yoF with L leg erythema and swelling. Pt has hx of DVT in 2002 but is on no [place]. D-dimer elevated, pharmacy to begin enoxaparin while awaiting dopplers tomorrow.  Goal of Therapy:  Anti-Xa level 0.6-1 units/ml 4hrs after LMWH dose given Monitor platelets by anticoagulation protocol: Yes   Plan:  -Enoxaparin 95mg  SQ q12h -Follow-up dopplers  Arrie Senate, PharmD, BCPS Clinical Pharmacist [phone] Please check AMION for all [facility] numbers 07/22/2018

## 2018-07-22 NOTE — ED Notes (Signed)
Dr. Jonelle Sidle returned text page, will order home meds as requested by patient.

## 2018-07-22 NOTE — Discharge Instructions (Signed)
Please proceed to the Emergency Department to make sure you do not have a blood clot of your left leg.

## 2018-07-23 ENCOUNTER — Inpatient Hospital Stay (HOSPITAL_COMMUNITY): Payer: Medicare Other

## 2018-07-23 DIAGNOSIS — M7989 Other specified soft tissue disorders: Secondary | ICD-10-CM

## 2018-07-23 DIAGNOSIS — I890 Lymphedema, not elsewhere classified: Secondary | ICD-10-CM

## 2018-07-23 DIAGNOSIS — J4522 Mild intermittent asthma with status asthmaticus: Secondary | ICD-10-CM

## 2018-07-23 DIAGNOSIS — R52 Pain, unspecified: Secondary | ICD-10-CM

## 2018-07-23 LAB — COMPREHENSIVE METABOLIC PANEL
ALBUMIN: 3 g/dL — AB (ref 3.5–5.0)
ALT: 31 U/L (ref 0–44)
AST: 38 U/L (ref 15–41)
Alkaline Phosphatase: 110 U/L (ref 38–126)
Anion gap: 6 (ref 5–15)
BUN: 6 mg/dL — AB (ref 8–23)
CHLORIDE: 105 mmol/L (ref 98–111)
CO2: 27 mmol/L (ref 22–32)
Calcium: 9.9 mg/dL (ref 8.9–10.3)
Creatinine, Ser: 1 mg/dL (ref 0.44–1.00)
GFR calc Af Amer: >60 mL/min (ref >60)
GFR, EST NON AFRICAN AMERICAN: 57 mL/min — AB (ref >60)
GLUCOSE: 141 mg/dL — AB (ref 70–99)
POTASSIUM: 3.7 mmol/L (ref 3.5–5.1)
SODIUM: 138 mmol/L (ref 135–145)
Total Bilirubin: 0.6 mg/dL (ref 0.3–1.2)
Total Protein: 7 g/dL (ref 6.5–8.1)

## 2018-07-23 LAB — CBC
HCT: 33 % — ABNORMAL LOW (ref 36.0–46.0)
Hemoglobin: 10.4 g/dL — ABNORMAL LOW (ref 12.0–15.0)
MCH: 25.6 pg — ABNORMAL LOW (ref 26.0–34.0)
MCHC: 31.5 g/dL (ref 30.0–36.0)
MCV: 81.3 fL (ref 80.0–100.0)
Platelets: 226 10*3/uL (ref 150–400)
RBC: 4.06 MIL/uL (ref 3.87–5.11)
RDW: 14.7 % (ref 11.5–15.5)
WBC: 10.6 10*3/uL — AB (ref 4.0–10.5)
nRBC: 0 % (ref 0.0–0.2)

## 2018-07-23 LAB — HIV ANTIBODY (ROUTINE TESTING W REFLEX): HIV SCREEN 4TH GENERATION: NONREACTIVE

## 2018-07-23 MED ORDER — FUROSEMIDE 10 MG/ML IJ SOLN
40.0000 mg | Freq: Every day | INTRAMUSCULAR | Status: DC
  Administered 2018-07-23 – 2018-07-25 (×3): 40 mg via INTRAVENOUS
  Filled 2018-07-23 (×3): qty 4

## 2018-07-23 MED ORDER — ENOXAPARIN SODIUM 100 MG/ML ~~LOC~~ SOLN
100.0000 mg | Freq: Two times a day (BID) | SUBCUTANEOUS | Status: DC
  Administered 2018-07-23 (×2): 100 mg via SUBCUTANEOUS
  Filled 2018-07-23 (×2): qty 1

## 2018-07-23 NOTE — Progress Notes (Signed)
VASCULAR LAB PRELIMINARY  PRELIMINARY  PRELIMINARY  PRELIMINARY  Left lower extremity venous duplex completed.    Preliminary report:  There is no DVT or SVT noted in the left lower extremity. Enlarged inguinal lymph node is noted.  Dhruv Christina, RVT 07/23/2018, 5:18 PM

## 2018-07-23 NOTE — Progress Notes (Signed)
PROGRESS NOTE    Jessica Yates  NGE:[id number] DOB: [birth year] DOA: 07/22/2018 PCP: Javier Docker, MD    Brief Narrative:  67 y.o. female with medical history significant of lymphedema, previous DVT, asthma, previous cellulitis, depression, anxiety and morbid obesity who presented to the ER with bilateral lower extremity swelling left more than right.  She first went to urgent care center where they were worried about possible blood clot and sent the patient over to the hospital for evaluation.  Patient reported increasing swelling in the left leg within the last 2 days.  There is erythema blistering and oozing of fluids.  Patient has had some chills no fever.  She has decreased mobility.  Has previous DVT back in 2002 and completed anticoagulation.  Patient is evaluated and being admitted to evaluate for possible DVT although's her left lower extremity appeared to be more cellulitis.  ED Course: Temperature is 98.4 blood pressure 133/73 pulse 106 respiratory rate of 27 oxygen sat 100% room air.  White count 9.8 with hemoglobin 10.3 and platelet 199.  Electrolytes essentially normal with an 0.91 glucose 136 lactic acid 1.12.  Chest x-ray showed no active disease.  Patient initiated on IV antibiotics and Doppler ultrasound ordered.  She is being admitted for evaluation and treatment.  Assessment & Plan:   Principal Problem:   Cellulitis and abscess of left leg Active Problems:   Extrinsic asthma   CHF (congestive heart failure) (HCC)   Lymphedema  #1 left lower extremity cellulitis versus DVT.:  Patient was started empirically on Ancef.  Also continued on empiric therapeutic lovenox while waiting for Doppler ultrasound to r/o DVT. Blood cultures obtained, pending.  Follow closely.  #2 possible DVT: With past history of DVT.  Continued on empiric lovenox.  If negative for DVT we will stop and convert to prophylactic dose.  #3 CHF: Appears to be diastolic dysfunction.  No recent  echo.  Continue home regimen as tolerated.  #4 history of asthma: No significant wheezing.  Monitor and continue home treatment. On minimal O2 support  #5 lymphedema: Chronic lymphedema.  Patient not being followed at the clinic.  Counseled patient to follow-up with lymphedema clinic. Cont to recommend LE elevation   DVT prophylaxis: Therapeutic lovenox Code Status: Full Family Communication: Pt in room, family not at bedside Disposition Plan: Uncertain at this time  Consultants:     Procedures:     Antimicrobials: Anti-infectives (From admission, onward)   Start     Dose/Rate Route Frequency Ordered Stop   07/23/18 0200  ceFAZolin (ANCEF) IVPB 1 g/50 mL premix     1 g 100 mL/hr over 30 Minutes Intravenous Every 8 hours 07/22/18 2347     07/22/18 1930  ceFAZolin (ANCEF) IVPB 1 g/50 mL premix     1 g 100 mL/hr over 30 Minutes Intravenous  Once 07/22/18 1926 07/22/18 2113       Subjective: No complaints  Objective: Vitals:   07/22/18 2130 07/22/18 2300 07/22/18 2355 07/23/18 0523  BP: 127/74 115/66 129/76 113/69  Pulse: (!) 106 91 97 87  Resp: (!) 26 19 18 18   Temp:   99.2 F (37.3 C) 98.9 F (37.2 C)  TempSrc:   Oral Oral  SpO2: 98% 97% 100% 99%  Weight:   107 kg   Height:   5\' 7"  (1.702 m)     Intake/Output Summary (Last 24 hours) at 07/23/2018 1402 Last data filed at 07/23/2018 1109 Gross per 24 hour  Intake 860 ml  Output 300 ml  Net 560 ml   Filed Weights   07/22/18 1930 07/22/18 2355  Weight: 95.3 kg 107 kg    Examination:  General exam: Appears calm and comfortable  Respiratory system: Clear to auscultation. Respiratory effort normal. Cardiovascular system: S1 & S2 heard, RRR Gastrointestinal system: Abdomen is nondistended, soft and nontender. No organomegaly or masses felt. Normal bowel sounds heard. Central nervous system: Alert and oriented. No focal neurological deficits. Extremities: Symmetric 5 x 5 power, BLE edema, L>R Skin: No  rashes, lesions, LLE with erythema, tenderness Psychiatry: Judgement and insight appear normal. Mood & affect appropriate.   Data Reviewed: I have personally reviewed following labs and imaging studies  CBC: Recent Labs  Lab 07/22/18 1940 07/23/18 0403  WBC 9.8 10.6*  NEUTROABS 7.9*  --   HGB 10.3* 10.4*  HCT 33.5* 33.0*  MCV 83.1 81.3  PLT 199 824   Basic Metabolic Panel: Recent Labs  Lab 07/22/18 1940 07/23/18 0403  NA 138 138  K 3.6 3.7  CL 103 105  CO2 26 27  GLUCOSE 136* 141*  BUN 8 6*  CREATININE 0.98 1.00  CALCIUM 10.0 9.9   GFR: Estimated Creatinine Clearance: 68.8 mL/min (by C-G formula based on SCr of 1 mg/dL). Liver Function Tests: Recent Labs  Lab 07/23/18 0403  AST 38  ALT 31  ALKPHOS 110  BILITOT 0.6  PROT 7.0  ALBUMIN 3.0*   No results for input(s): LIPASE, AMYLASE in the last 168 hours. No results for input(s): AMMONIA in the last 168 hours. Coagulation Profile: Recent Labs  Lab 07/22/18 1940  INR 0.92   Cardiac Enzymes: No results for input(s): CKTOTAL, CKMB, CKMBINDEX, TROPONINI in the last 168 hours. BNP (last 3 results) No results for input(s): PROBNP in the last 8760 hours. HbA1C: No results for input(s): HGBA1C in the last 72 hours. CBG: No results for input(s): GLUCAP in the last 168 hours. Lipid Profile: No results for input(s): CHOL, HDL, LDLCALC, TRIG, CHOLHDL, LDLDIRECT in the last 72 hours. Thyroid Function Tests: No results for input(s): TSH, T4TOTAL, FREET4, T3FREE, THYROIDAB in the last 72 hours. Anemia Panel: No results for input(s): VITAMINB12, FOLATE, FERRITIN, TIBC, IRON, RETICCTPCT in the last 72 hours. Sepsis Labs: Recent Labs  Lab 07/22/18 1944  LATICACIDVEN 1.12    Recent Results (from the past 240 hour(s))  Blood culture (routine x 2)     Status: None (Preliminary result)   Collection Time: 07/22/18  7:30 PM  Result Value Ref Range Status   Specimen Description BLOOD RIGHT ANTECUBITAL  Final    Special Requests   Final    BOTTLES DRAWN AEROBIC AND ANAEROBIC Blood Culture adequate volume   Culture   Final    NO GROWTH < 24 HOURS Performed at [facility], [address], [place], [place] [address]    Report Status PENDING  Incomplete  Blood culture (routine x 2)     Status: None (Preliminary result)   Collection Time: 07/22/18  8:25 PM  Result Value Ref Range Status   Specimen Description BLOOD RIGHT ARM  Final   Special Requests   Final    BOTTLES DRAWN AEROBIC AND ANAEROBIC Blood Culture adequate volume   Culture   Final    NO GROWTH < 24 HOURS Performed at [facility], [place] [address] [place], [place] [address]    Report Status PENDING  Incomplete     Radiology Studies: Dg Chest Port 1 View  Result Date: 07/22/2018  CLINICAL DATA:  Chest pain, shortness of breath. EXAM: PORTABLE CHEST 1 VIEW COMPARISON:  Radiographs of March 27, 2011. FINDINGS: Stable cardiomegaly. No pneumothorax or pleural effusion is noted. Both lungs are clear. The visualized skeletal structures are unremarkable. IMPRESSION: No active disease. Electronically Signed   By: Marijo Conception, M.D.   On: 07/22/2018 19:49    Scheduled Meds: . aspirin EC  81 mg Oral Daily  . celecoxib  100 mg Oral BID  . enoxaparin (LOVENOX) injection  100 mg Subcutaneous Q12H  . ipratropium  2 spray Nasal Daily  . latanoprost  1 drop Both Eyes QHS  . mirabegron ER  25 mg Oral Daily  . pantoprazole  40 mg Oral Daily  . potassium chloride SA  20 mEq Oral Daily   Continuous Infusions: .  ceFAZolin (ANCEF) IV Stopped (07/23/18 0214)     LOS: 1 day   Marylu Lund, MD [facility] Pager On Amion  If 7PM-7AM, please contact night-coverage 07/23/2018, 2:02 PM

## 2018-07-23 NOTE — Progress Notes (Signed)
ANTICOAGULATION CONSULT NOTE - Initial Consult  Pharmacy Consult for enoxaparin Indication: DVT  No Known Allergies  Patient Measurements: Height: 5\' 7"  (170.2 cm) Weight: 235 lb 12.8 oz (107 kg) IBW/kg (Calculated) : 61.6 Heparin Dosing Weight: 95kg  Vital Signs: Temp: 98.9 F (37.2 C) (11/10 0523) Temp Source: Oral (11/10 0523) BP: 113/69 (11/10 0523) Pulse Rate: 87 (11/10 0523)  Labs: Recent Labs    07/22/18 1940 07/23/18 0403  HGB 10.3* 10.4*  HCT 33.5* 33.0*  PLT 199 226  LABPROT 12.3  --   INR 0.92  --   CREATININE 0.98 1.00    Estimated Creatinine Clearance: 68.8 mL/min (by C-G formula based on SCr of 1 mg/dL).   Medical History: Past Medical History:  Diagnosis Date  . Anemia 2002  . Anxiety   . Arthritis   . Asthma   . Blood clot in vein   . Cellulitis   . Cough   . Depression 02/06/2008  . DVT (deep venous thrombosis) ([place]) 2002   left leg  . Glaucoma   . Lymphedema   . Palpitations   . Peripheral venous insufficiency   . Seasonal allergies   . Sickle cell trait ([place])   . Vitamin D deficiency      Assessment: 33 yoF with L leg erythema and swelling. Pt has hx of DVT in 2002 but is on no [place]. D-dimer elevated, pharmacy to begin enoxaparin while awaiting dopplers tomorrow.  Weight signficantly changed overnight. Patient is ~100kg so will use that for the 1mg /kg dose.  Goal of Therapy:  Anti-Xa level 0.6-1 units/ml 4hrs after LMWH dose given Monitor platelets by anticoagulation protocol: Yes   Plan:  Change enoxaparin to 100mg  [place] Q12h Follow-up dopplers  Elenor Quinones, PharmD, BCPS Clinical Pharmacist Phone number [phone] 07/23/2018 8:20 AM

## 2018-07-24 DIAGNOSIS — I5031 Acute diastolic (congestive) heart failure: Secondary | ICD-10-CM

## 2018-07-24 LAB — COMPREHENSIVE METABOLIC PANEL
ALK PHOS: 123 U/L (ref 38–126)
ALT: 25 U/L (ref 0–44)
AST: 28 U/L (ref 15–41)
Albumin: 2.7 g/dL — ABNORMAL LOW (ref 3.5–5.0)
Anion gap: 8 (ref 5–15)
BILIRUBIN TOTAL: 0.5 mg/dL (ref 0.3–1.2)
CALCIUM: 9.4 mg/dL (ref 8.9–10.3)
CHLORIDE: 102 mmol/L (ref 98–111)
CO2: 29 mmol/L (ref 22–32)
CREATININE: 0.94 mg/dL (ref 0.44–1.00)
GFR calc Af Amer: >60 mL/min (ref >60)
Glucose, Bld: 119 mg/dL — ABNORMAL HIGH (ref 70–99)
Potassium: 3.4 mmol/L — ABNORMAL LOW (ref 3.5–5.1)
Sodium: 139 mmol/L (ref 135–145)
TOTAL PROTEIN: 6.4 g/dL — AB (ref 6.5–8.1)

## 2018-07-24 LAB — CBC
HEMATOCRIT: 31.6 % — AB (ref 36.0–46.0)
Hemoglobin: 9.8 g/dL — ABNORMAL LOW (ref 12.0–15.0)
MCH: 25.1 pg — ABNORMAL LOW (ref 26.0–34.0)
MCHC: 31 g/dL (ref 30.0–36.0)
MCV: 80.8 fL (ref 80.0–100.0)
Platelets: 260 10*3/uL (ref 150–400)
RBC: 3.91 MIL/uL (ref 3.87–5.11)
RDW: 14.6 % (ref 11.5–15.5)
WBC: 11.5 10*3/uL — AB (ref 4.0–10.5)
nRBC: 0 % (ref 0.0–0.2)

## 2018-07-24 MED ORDER — POTASSIUM CHLORIDE CRYS ER 20 MEQ PO TBCR
40.0000 meq | EXTENDED_RELEASE_TABLET | Freq: Two times a day (BID) | ORAL | Status: AC
  Administered 2018-07-24 (×2): 40 meq via ORAL
  Filled 2018-07-24 (×3): qty 2

## 2018-07-24 MED ORDER — ENOXAPARIN SODIUM 40 MG/0.4ML ~~LOC~~ SOLN
40.0000 mg | SUBCUTANEOUS | Status: DC
  Administered 2018-07-24 – 2018-07-27 (×4): 40 mg via SUBCUTANEOUS
  Filled 2018-07-24 (×4): qty 0.4

## 2018-07-24 NOTE — ED Provider Notes (Signed)
[facility]   [id number] 07/22/18 Arrival Time: 7829  ASSESSMENT & PLAN:  1. Redness and swelling of lower leg   2. Lymphedema of both lower extremities   Severe exacerbation of LLE lymphedema with concern for cellulitis +/- DVT. Given her significant increase in swelling/erythema, I am sending her to the ED for further evaluation to r/o DVT and for possible admission. She does have a distant h/o a DVT.  Follow-up Information    Go to  [facility].   Specialty:  Emergency Medicine Contact information: [address] [phone] [place] [phone]         Reviewed expectations re: course of current medical issues. Questions answered. Outlined signs and symptoms indicating need for more acute intervention. Patient verbalized understanding. After Visit Summary given.   SUBJECTIVE: History from: patient. Jessica Yates is a 67 y.o. female with chronic lymphedema of her bilateral lower extremities who presents with complaint of increased swelling with redness of her LLE. Gradual over the past few days. No injury reported. Afebrile but with occasional chills. Has noticed "a small blister" over anterior LLE. Overall she hasn't been as mobile. No recent prolonged immobility. No new medications. No specific aggravating or alleviating factors reported. Is ambulatory. No chest pain or SOB reported. Normal appetite and PO intake.  ROS: As per HPI. All other systems negative.    OBJECTIVE:  Vitals:   07/22/18 1737  BP: 133/73  Pulse: (!) 105  Resp: 18  Temp: 98.4 F (36.9 C)  SpO2: 100%    General appearance: alert; no distress Eyes: PERRLA; EOMI; conjunctiva normal HENT: normocephalic; atraumatic; oral mucosa normal Neck: supple  Lungs: clear to auscultation bilaterally; unlabored respirations; no wheezing Heart: regular rate and rhythm without murmer Abdomen: soft, non-tender;  obese Back: no CVA tenderness Extremities: significant edema of bilateral lower extremities; LLE with 3++ and overlying erythema of anterior lower leg that does not extend above knee; very warm to touch; some tenderness; small area of blistering on anterior LLE with slight clear drainage; no open wounds; RLE is at baseline per her report Skin: warm and dry; venous stasis changes of bilateral lower extremities Neurologic: very slow but normal gait but favors LLE; unable to palpate DP/PT pulses of LLE secondary to swelling/edema Psychological: alert and cooperative; normal mood and affect   No Known Allergies  Past Medical History:  Diagnosis Date  . Anemia 2002  . Anxiety   . Arthritis   . Asthma   . Blood clot in vein   . Cellulitis   . Cough   . Depression 02/06/2008  . DVT (deep venous thrombosis) ([place]) 2002   left leg  . Glaucoma   . Lymphedema   . Palpitations   . Peripheral venous insufficiency   . Seasonal allergies   . Sickle cell trait ([place])   . Vitamin D deficiency    Social History   Socioeconomic History  . Marital status: Single    Spouse name: Not on file  . Number of children: 0  . Years of education: Not on file  . Highest education level: Not on file  Occupational History  . Occupation: retired from [employer]  . Financial resource strain: Not on file  . Food insecurity:    Worry: Not on file    Inability: Not on file  . Transportation needs:    Medical: Not on file    Non-medical: Not on  file  Tobacco Use  . Smoking status: Never Smoker  . Smokeless tobacco: Never Used  Substance and Sexual Activity  . Alcohol use: No  . Drug use: No  . Sexual activity: Never  Lifestyle  . Physical activity:    Days per week: Not on file    Minutes per session: Not on file  . Stress: Not on file  Relationships  . Social connections:    Talks on phone: Not on file    Gets together: Not on file    Attends religious service:  Not on file    Active member of club or organization: Not on file    Attends meetings of clubs or organizations: Not on file    Relationship status: Not on file  . Intimate partner violence:    Fear of current or ex partner: Not on file    Emotionally abused: Not on file    Physically abused: Not on file    Forced sexual activity: Not on file  Other Topics Concern  . Not on file  Social History Narrative   Lives with mother and brother         Family History  Problem Relation Age of Onset  . Allergies Mother   . Asthma Mother   . Other Mother        enlarged heart  . Arthritis Mother   . Breast cancer Mother   . Diabetes Mother   . Hypertension Mother   . Cancer Mother        breast  . Hyperlipidemia Mother   . Heart disease Maternal Grandmother   . Breast cancer Maternal Grandmother   . Birth defects Maternal Grandmother        breast  . Cancer Father 67       leukemia  . Allergies Sister   . Arthritis Sister    Past Surgical History:  Procedure Laterality Date  . COSMETIC SURGERY  1975   nasal reconstruction  . TOTAL ABDOMINAL HYSTERECTOMY  07/2001     Vanessa Kick, MD 07/24/18 [phone]

## 2018-07-24 NOTE — Evaluation (Signed)
Physical Therapy Evaluation Patient Details Name: Jessica Yates MRN: [id number] DOB: 28-Nov-1950 Today's Date: 07/24/2018   History of Present Illness  67 y.o. female with medical history significant of lymphedema, previous DVT, asthma, previous cellulitis, depression, anxiety and morbid obesity who presented to the ER with bilateral lower extremity swelling left more than right.   Clinical Impression   Pt admitted with above diagnosis. Pt currently with functional limitations due to the deficits listed below (see PT Problem List). Managing independpently with straight cane prior to admission; Presents with mild gait unsteadiness, decr activity tolerance, significant bil LE edema; Tells me she has had lymphedema treatments in the past; worth considering revisiting outpt PT for lymphedema; Will order OT consult for ADLs per protocol;  Pt will benefit from skilled PT to increase their independence and safety with mobility to allow discharge to the venue listed below.       Follow Up Recommendations Outpatient PT;Other (comment)(for Lymphedema)    Equipment Recommendations  Rolling walker with 5" wheels;3in1 (PT)    Recommendations for Other Services OT consult(will order per protocol)     Precautions / Restrictions Precautions Precautions: Fall Precaution Comments: Fall risk greatly diminished with use of RW      Mobility  Bed Mobility Overal bed mobility: Modified Independent             General bed mobility comments: slow moving  Transfers Overall transfer level: Needs assistance   Transfers: Sit to/from Stand Sit to Stand: Min guard         General transfer comment: cues for hand placement  Ambulation/Gait Ambulation/Gait assistance: Min guard Gait Distance (Feet): 50 Feet(x2) Assistive device: Rolling walker (2 wheeled);Straight cane Gait Pattern/deviations: Decreased step length - right;Decreased step length - left;Wide base of support     General Gait  Details: walked with RW then with straight cane; RW for bilateral support helped to unweigh LLE in stance, which helped with pain; noting heavy footfall L and unsteadiness with amb with cane  Stairs            Wheelchair Mobility    Modified Rankin (Stroke Patients Only)       Balance                                             Pertinent Vitals/Pain Pain Assessment: Faces Faces Pain Scale: Hurts a little bit Pain Location: grimace with movement, likely related to effort Pain Descriptors / Indicators: Grimacing Pain Intervention(s): Monitored during session    Home Living Family/patient expects to be discharged to:: Private residence Living Arrangements: Other relatives Available Help at Discharge: Family;Available PRN/intermittently Type of Home: House Home Access: Stairs to enter Entrance Stairs-Rails: (need clarification) Entrance Stairs-Number of Steps: 3-5 Home Layout: One level Home Equipment: Cane - single point      Prior Function Level of Independence: Independent with assistive device(s)               Hand Dominance        Extremity/Trunk Assessment   Upper Extremity Assessment Upper Extremity Assessment: Overall WFL for tasks assessed(for simple tasks)    Lower Extremity Assessment Lower Extremity Assessment: RLE deficits/detail;LLE deficits/detail RLE Deficits / Details: significant edema, erythema, L greater than R LLE Deficits / Details: significant edema, erythema, L greater than R       Communication   Communication: No difficulties  Cognition Arousal/Alertness: Awake/alert Behavior During Therapy: WFL for tasks assessed/performed Overall Cognitive Status: Within Functional Limits for tasks assessed                                        General Comments      Exercises     Assessment/Plan    PT Assessment Patient needs continued PT services  PT Problem List Decreased strength;Decreased  range of motion;Decreased activity tolerance;Decreased balance;Decreased mobility;Decreased knowledge of use of DME;Decreased knowledge of precautions       PT Treatment Interventions DME instruction;Gait training;Stair training;Functional mobility training;Therapeutic activities;Therapeutic exercise;Balance training;Patient/family education    PT Goals (Current goals can be found in the Care Plan section)  Acute Rehab PT Goals Patient Stated Goal: swelling to go down PT Goal Formulation: With patient Time For Goal Achievement: 08/07/18 Potential to Achieve Goals: Good    Frequency Min 3X/week   Barriers to discharge Decreased caregiver support Must be modified independent    Co-evaluation               AM-PAC PT "6 Clicks" Daily Activity  Outcome Measure Difficulty turning over in bed (including adjusting bedclothes, sheets and blankets)?: A Little Difficulty moving from lying on back to sitting on the side of the bed? : A Little Difficulty sitting down on and standing up from a chair with arms (e.g., wheelchair, bedside commode, etc,.)?: A Little Help needed moving to and from a bed to chair (including a wheelchair)?: A Little Help needed walking in hospital room?: A Little Help needed climbing 3-5 steps with a railing? : A Lot 6 Click Score: 17    End of Session Equipment Utilized During Treatment: Gait belt Activity Tolerance: Patient tolerated treatment well Patient left: in chair;with call bell/phone within reach Nurse Communication: Mobility status PT Visit Diagnosis: Unsteadiness on feet (R26.81);Other abnormalities of gait and mobility (R26.89);Other (comment)(Lymphedema)    Time: 6759-1638 PT Time Calculation (min) (ACUTE ONLY): 19 min   Charges:   PT Evaluation $PT Eval Low Complexity: [place], PT  Acute Rehabilitation Services Pager [phone] Office [place] 07/24/2018, 4:34 PM

## 2018-07-24 NOTE — Progress Notes (Signed)
PROGRESS NOTE    Jessica Yates  ION:[id number] DOB: Sep 10, 1951 DOA: 07/22/2018 PCP: Javier Docker, MD    Brief Narrative:  67 y.o. female with medical history significant of lymphedema, previous DVT, asthma, previous cellulitis, depression, anxiety and morbid obesity who presented to the ER with bilateral lower extremity swelling left more than right.  She first went to urgent care center where they were worried about possible blood clot and sent the patient over to the hospital for evaluation.  Patient reported increasing swelling in the left leg within the last 2 days.  There is erythema blistering and oozing of fluids.  Patient has had some chills no fever.  She has decreased mobility.  Has previous DVT back in 2002 and completed anticoagulation.  Patient is evaluated and being admitted to evaluate for possible DVT although's her left lower extremity appeared to be more cellulitis.  ED Course: Temperature is 98.4 blood pressure 133/73 pulse 106 respiratory rate of 27 oxygen sat 100% room air.  White count 9.8 with hemoglobin 10.3 and platelet 199.  Electrolytes essentially normal with an 0.91 glucose 136 lactic acid 1.12.  Chest x-ray showed no active disease.  Patient initiated on IV antibiotics and Doppler ultrasound ordered.  She is being admitted for evaluation and treatment.  Assessment & Plan:   Principal Problem:   Cellulitis and abscess of left leg Active Problems:   Extrinsic asthma   CHF (congestive heart failure) (HCC)   Lymphedema  #1 left lower extremity cellulitis versus DVT.:  Patient was started empirically on Ancef. Blood cultures obtained, neg thus far. LE dopplers neg for DVT. Transitioned to prophylactic dosed lovenox  #2ruled out DVT: With past history of DVT.  Initially continued on empiric lovenox.  LE dopplers neg for DVT, thus anticoagulation discontinued  #3 Acute on chronic diastolic CHF: Appears to be diastolic dysfunction.  No recent echo.   Continue home regimen as tolerated.  -Markedly edematous LE edema in setting of known lymphemdema.  -Continue on scheduled IV lasix -Repeat bmet in AM  #4 history of asthma: No significant wheezing.  Monitor and continue home treatment. On minimal O2 support. Now wheezing at this time  #5 lymphedema: Chronic lymphedema.  Patient not being followed at the clinic.  Counseled patient to follow-up with lymphedema clinic. Cont to recommend LE elevation. Started lasix as per above   DVT prophylaxis: Therapeutic lovenox Code Status: Full Family Communication: Pt in room, family not at bedside Disposition Plan: Uncertain at this time  Consultants:     Procedures:     Antimicrobials: Anti-infectives (From admission, onward)   Start     Dose/Rate Route Frequency Ordered Stop   07/23/18 0200  ceFAZolin (ANCEF) IVPB 1 g/50 mL premix     1 g 100 mL/hr over 30 Minutes Intravenous Every 8 hours 07/22/18 2347     07/22/18 1930  ceFAZolin (ANCEF) IVPB 1 g/50 mL premix     1 g 100 mL/hr over 30 Minutes Intravenous  Once 07/22/18 1926 07/22/18 2113      Subjective: Complains of continued LE swelling  Objective: Vitals:   07/23/18 1446 07/23/18 2145 07/24/18 0441 07/24/18 1413  BP: 123/74 (!) 141/82 99/66 118/76  Pulse: 95 92 91 95  Resp: 18 18 16 18   Temp: 98.2 F (36.8 C) 98.2 F (36.8 C) 98.6 F (37 C) 98.5 F (36.9 C)  TempSrc: Oral Oral Oral Oral  SpO2: 100% 100% 100% 100%  Weight:      Height:  Intake/Output Summary (Last 24 hours) at 07/24/2018 1607 Last data filed at 07/24/2018 1315 Gross per 24 hour  Intake 1262.56 ml  Output 2750 ml  Net -1487.44 ml   Filed Weights   07/22/18 1930 07/22/18 2355  Weight: 95.3 kg 107 kg    Examination: General exam: Awake, laying in bed, in nad Respiratory system: Normal respiratory effort, no wheezing Cardiovascular system: regular rate, s1, s2 Gastrointestinal system: Soft, nondistended, positive BS Central  nervous system: CN2-12 grossly intact, strength intact Extremities: Perfused, no clubbing, B LE edema L>R Skin: Normal skin turgor, no notable skin lesions seen Psychiatry: Mood normal // no visual hallucinations   Data Reviewed: I have personally reviewed following labs and imaging studies  CBC: Recent Labs  Lab 07/22/18 1940 07/23/18 0403 07/24/18 0157  WBC 9.8 10.6* 11.5*  NEUTROABS 7.9*  --   --   HGB 10.3* 10.4* 9.8*  HCT 33.5* 33.0* 31.6*  MCV 83.1 81.3 80.8  PLT 199 226 062   Basic Metabolic Panel: Recent Labs  Lab 07/22/18 1940 07/23/18 0403 07/24/18 0157  NA 138 138 139  K 3.6 3.7 3.4*  CL 103 105 102  CO2 26 27 29   GLUCOSE 136* 141* 119*  BUN 8 6* <5*  CREATININE 0.98 1.00 0.94  CALCIUM 10.0 9.9 9.4   GFR: Estimated Creatinine Clearance: 73.2 mL/min (by C-G formula based on SCr of 0.94 mg/dL). Liver Function Tests: Recent Labs  Lab 07/23/18 0403 07/24/18 0157  AST 38 28  ALT 31 25  ALKPHOS 110 123  BILITOT 0.6 0.5  PROT 7.0 6.4*  ALBUMIN 3.0* 2.7*   No results for input(s): LIPASE, AMYLASE in the last 168 hours. No results for input(s): AMMONIA in the last 168 hours. Coagulation Profile: Recent Labs  Lab 07/22/18 1940  INR 0.92   Cardiac Enzymes: No results for input(s): CKTOTAL, CKMB, CKMBINDEX, TROPONINI in the last 168 hours. BNP (last 3 results) No results for input(s): PROBNP in the last 8760 hours. HbA1C: No results for input(s): HGBA1C in the last 72 hours. CBG: No results for input(s): GLUCAP in the last 168 hours. Lipid Profile: No results for input(s): CHOL, HDL, LDLCALC, TRIG, CHOLHDL, LDLDIRECT in the last 72 hours. Thyroid Function Tests: No results for input(s): TSH, T4TOTAL, FREET4, T3FREE, THYROIDAB in the last 72 hours. Anemia Panel: No results for input(s): VITAMINB12, FOLATE, FERRITIN, TIBC, IRON, RETICCTPCT in the last 72 hours. Sepsis Labs: Recent Labs  Lab 07/22/18 1944  LATICACIDVEN 1.12    Recent Results  (from the past 240 hour(s))  Blood culture (routine x 2)     Status: None (Preliminary result)   Collection Time: 07/22/18  7:30 PM  Result Value Ref Range Status   Specimen Description BLOOD RIGHT ANTECUBITAL  Final   Special Requests   Final    BOTTLES DRAWN AEROBIC AND ANAEROBIC Blood Culture adequate volume   Culture   Final    NO GROWTH 2 DAYS Performed at [facility], [address] [place], [place] [address]    Report Status PENDING  Incomplete  Blood culture (routine x 2)     Status: None (Preliminary result)   Collection Time: 07/22/18  8:25 PM  Result Value Ref Range Status   Specimen Description BLOOD RIGHT ARM  Final   Special Requests   Final    BOTTLES DRAWN AEROBIC AND ANAEROBIC Blood Culture adequate volume   Culture   Final    NO GROWTH 2 DAYS Performed at [facility], [address] [place], [place] [address]    Report Status PENDING  Incomplete     Radiology Studies: Dg Chest Port 1 View  Result Date: 07/22/2018 CLINICAL DATA:  Chest pain, shortness of breath. EXAM: PORTABLE CHEST 1 VIEW COMPARISON:  Radiographs of March 27, 2011. FINDINGS: Stable cardiomegaly. No pneumothorax or pleural effusion is noted. Both lungs are clear. The visualized skeletal structures are unremarkable. IMPRESSION: No active disease. Electronically Signed   By: Marijo Conception, M.D.   On: 07/22/2018 19:49   Vas [place] Lower Extremity Venous (dvt)  Result Date: 07/24/2018  Lower Venous Study Indications: Pain, Swelling, Erythema, and cellulitis.  Risk Factors: Lymphedema. Limitations: Body habitus and edema. Comparison Study: Negative study done 12/29/15 is available for comparison Performing Technologist: Sharion Dove RVS  Examination Guidelines: A complete evaluation includes B-mode imaging, spectral Doppler, color Doppler, and power Doppler as needed of all accessible portions of each vessel. Bilateral testing is considered an integral part of a complete examination. Limited  examinations for reoccurring indications may be performed as noted.  Right Venous Findings: +---+---------------+---------+-----------+----------+-------+    CompressibilityPhasicitySpontaneityPropertiesSummary +---+---------------+---------+-----------+----------+-------+ CFVFull           Yes      Yes                          +---+---------------+---------+-----------+----------+-------+  Left Venous Findings: +---------+---------------+---------+-----------+----------+-------+          CompressibilityPhasicitySpontaneityPropertiesSummary +---------+---------------+---------+-----------+----------+-------+ CFV      Full                                                 +---------+---------------+---------+-----------+----------+-------+ SFJ      Full                                                 +---------+---------------+---------+-----------+----------+-------+ FV Prox  Full                                                 +---------+---------------+---------+-----------+----------+-------+ FV Mid   Full                                                 +---------+---------------+---------+-----------+----------+-------+ FV DistalFull                                                 +---------+---------------+---------+-----------+----------+-------+ PFV      Full                                                 +---------+---------------+---------+-----------+----------+-------+ POP      Full                                                 +---------+---------------+---------+-----------+----------+-------+  PTV      Full                                                 +---------+---------------+---------+-----------+----------+-------+ PERO     Full                                                 +---------+---------------+---------+-----------+----------+-------+ GSV      Full                                                  +---------+---------------+---------+-----------+----------+-------+  Left Technical Findings: Not visualized segments include not all portions of the peroneal or posterior tibial vein.   Summary: Right: No evidence of common femoral vein obstruction. Left: There is no evidence of deep vein thrombosis in the lower extremity. However, portions of this examination were limited- see technologist comments above. Ultrasound characteristics of enlarged lymph nodes noted in the groin.  *See table(s) above for measurements and observations. Electronically signed by Monica Martinez MD on 07/24/2018 at 3:55:43 PM.    Final     Scheduled Meds: . aspirin EC  81 mg Oral Daily  . celecoxib  100 mg Oral BID  . enoxaparin (LOVENOX) injection  40 mg Subcutaneous Q24H  . furosemide  40 mg Intravenous Daily  . ipratropium  2 spray Nasal Daily  . latanoprost  1 drop Both Eyes QHS  . mirabegron ER  25 mg Oral Daily  . pantoprazole  40 mg Oral Daily  . potassium chloride  40 mEq Oral BID   Continuous Infusions: .  ceFAZolin (ANCEF) IV 1 g (07/24/18 1351)     LOS: 2 days   Marylu Lund, MD [facility] Pager On Amion  If 7PM-7AM, please contact night-coverage 07/24/2018, 4:07 PM

## 2018-07-24 NOTE — Plan of Care (Signed)
  Problem: Education: Goal: Knowledge of General Education information will improve Description Including pain rating scale, medication(s)/side effects and non-pharmacologic comfort measures Outcome: Progressing   Problem: Health Behavior/Discharge Planning: Goal: Ability to manage health-related needs will improve Outcome: Progressing   Problem: Clinical Measurements: Goal: Ability to maintain clinical measurements within normal limits will improve Outcome: Progressing Goal: Will remain free from infection Outcome: Progressing Goal: Diagnostic test results will improve Outcome: Progressing Goal: Respiratory complications will improve Outcome: Progressing Goal: Cardiovascular complication will be avoided Outcome: Progressing   Problem: Activity: Goal: Risk for activity intolerance will decrease Outcome: Progressing   Problem: Nutrition: Goal: Adequate nutrition will be maintained Outcome: Progressing   Problem: Coping: Goal: Level of anxiety will decrease Outcome: Progressing   Problem: Elimination: Goal: Will not experience complications related to bowel motility Outcome: Progressing Goal: Will not experience complications related to urinary retention Outcome: Progressing   Problem: Pain Managment: Goal: General experience of comfort will improve Outcome: Progressing   Problem: Safety: Goal: Ability to remain free from injury will improve Outcome: Progressing   Problem: Skin Integrity: Goal: Risk for impaired skin integrity will decrease Outcome: Progressing  Forestine Chute, RN 07/24/2018

## 2018-07-25 DIAGNOSIS — R600 Localized edema: Secondary | ICD-10-CM

## 2018-07-25 LAB — BASIC METABOLIC PANEL
Anion gap: 6 (ref 5–15)
BUN: 6 mg/dL — AB (ref 8–23)
CALCIUM: 9.4 mg/dL (ref 8.9–10.3)
CO2: 28 mmol/L (ref 22–32)
CREATININE: 1.04 mg/dL — AB (ref 0.44–1.00)
Chloride: 104 mmol/L (ref 98–111)
GFR calc Af Amer: >60 mL/min (ref >60)
GFR, EST NON AFRICAN AMERICAN: 54 mL/min — AB (ref >60)
Glucose, Bld: 183 mg/dL — ABNORMAL HIGH (ref 70–99)
Potassium: 4 mmol/L (ref 3.5–5.1)
Sodium: 138 mmol/L (ref 135–145)

## 2018-07-25 MED ORDER — FUROSEMIDE 10 MG/ML IJ SOLN
40.0000 mg | Freq: Two times a day (BID) | INTRAMUSCULAR | Status: DC
  Administered 2018-07-25 – 2018-07-28 (×6): 40 mg via INTRAVENOUS
  Filled 2018-07-25 (×6): qty 4

## 2018-07-25 NOTE — Progress Notes (Signed)
PROGRESS NOTE    Jessica Yates  PYK:[id number] DOB: Oct 02, 1950 DOA: 07/22/2018 PCP: Javier Docker, MD    Brief Narrative:  67 y.o. female with medical history significant of lymphedema, previous DVT, asthma, previous cellulitis, depression, anxiety and morbid obesity who presented to the ER with bilateral lower extremity swelling left more than right.  She first went to urgent care center where they were worried about possible blood clot and sent the patient over to the hospital for evaluation.  Patient reported increasing swelling in the left leg within the last 2 days.  There is erythema blistering and oozing of fluids.  Patient has had some chills no fever.  She has decreased mobility.  Has previous DVT back in 2002 and completed anticoagulation.  Patient is evaluated and being admitted to evaluate for possible DVT although's her left lower extremity appeared to be more cellulitis.  ED Course: Temperature is 98.4 blood pressure 133/73 pulse 106 respiratory rate of 27 oxygen sat 100% room air.  White count 9.8 with hemoglobin 10.3 and platelet 199.  Electrolytes essentially normal with an 0.91 glucose 136 lactic acid 1.12.  Chest x-ray showed no active disease.  Patient initiated on IV antibiotics and Doppler ultrasound ordered.  She is being admitted for evaluation and treatment.  Assessment & Plan:   Principal Problem:   Cellulitis and abscess of left leg Active Problems:   Extrinsic asthma   CHF (congestive heart failure) (HCC)   Lymphedema  #1 left lower extremity cellulitis, DVT ruled out.:  Patient was started empirically on Ancef. Blood cultures obtained, neg thus far. LE dopplers neg for DVT. Transitioned to prophylactic dosed lovenox. Afebrile.  #2ruled out DVT: With past history of DVT.  Initially continued on empiric lovenox.  LE dopplers neg for DVT, thus anticoagulation discontinued  #3 Acute on chronic diastolic CHF: Appears to be diastolic dysfunction.  No  recent echo.  Continue home regimen as tolerated.  -Markedly edematous LE edema in setting of known lymphemdema.  -Continue on scheduled IV lasix with improvement -Repeat bmet in AM -Will check 2d echo  #4 history of asthma:  Continue to monitor. On minimal O2 support. Now wheezing at this time  #5 lymphedema: Chronic lymphedema.  Patient not being followed at the clinic.  Counseled patient to follow-up with lymphedema clinic. Cont to recommend LE elevation. Continue on IV lasix per above. Improving. Will increase lasix to bid dosing   DVT prophylaxis: Therapeutic lovenox Code Status: Full Family Communication: Pt in room, family not at bedside Disposition Plan: Uncertain at this time  Consultants:     Procedures:     Antimicrobials: Anti-infectives (From admission, onward)   Start     Dose/Rate Route Frequency Ordered Stop   07/23/18 0200  ceFAZolin (ANCEF) IVPB 1 g/50 mL premix     1 g 100 mL/hr over 30 Minutes Intravenous Every 8 hours 07/22/18 2347     07/22/18 1930  ceFAZolin (ANCEF) IVPB 1 g/50 mL premix     1 g 100 mL/hr over 30 Minutes Intravenous  Once 07/22/18 1926 07/22/18 2113      Subjective: Swelling improved. Reporting some improvement  Objective: Vitals:   07/24/18 1413 07/24/18 1943 07/25/18 0515 07/25/18 1513  BP: 118/76 118/85 107/66 124/74  Pulse: 95 98 80 96  Resp: 18 18 14    Temp: 98.5 F (36.9 C) 98.7 F (37.1 C) 98.7 F (37.1 C) 97.6 F (36.4 C)  TempSrc: Oral Oral Oral Oral  SpO2: 100% 98% 100% 99%  Weight:      Height:        Intake/Output Summary (Last 24 hours) at 07/25/2018 1607 Last data filed at 07/25/2018 0900 Gross per 24 hour  Intake 860 ml  Output -  Net 860 ml   Filed Weights   07/22/18 1930 07/22/18 2355  Weight: 95.3 kg 107 kg    Examination: General exam: Conversant, in no acute distress Respiratory system: normal chest rise, clear, no audible wheezing Cardiovascular system: regular rhythm,  s1-s2 Gastrointestinal system: Nondistended, nontender, pos BS Central nervous system: No seizures, no tremors Extremities: No cyanosis, no joint deformities, BLE edema improving Skin: No rashes, no pallor Psychiatry: Affect normal // no auditory hallucinations   Data Reviewed: I have personally reviewed following labs and imaging studies  CBC: Recent Labs  Lab 07/22/18 1940 07/23/18 0403 07/24/18 0157  WBC 9.8 10.6* 11.5*  NEUTROABS 7.9*  --   --   HGB 10.3* 10.4* 9.8*  HCT 33.5* 33.0* 31.6*  MCV 83.1 81.3 80.8  PLT 199 226 102   Basic Metabolic Panel: Recent Labs  Lab 07/22/18 1940 07/23/18 0403 07/24/18 0157 07/25/18 0155  NA 138 138 139 138  K 3.6 3.7 3.4* 4.0  CL 103 105 102 104  CO2 26 27 29 28   GLUCOSE 136* 141* 119* 183*  BUN 8 6* <5* 6*  CREATININE 0.98 1.00 0.94 1.04*  CALCIUM 10.0 9.9 9.4 9.4   GFR: Estimated Creatinine Clearance: 66.1 mL/min (A) (by C-G formula based on SCr of 1.04 mg/dL (H)). Liver Function Tests: Recent Labs  Lab 07/23/18 0403 07/24/18 0157  AST 38 28  ALT 31 25  ALKPHOS 110 123  BILITOT 0.6 0.5  PROT 7.0 6.4*  ALBUMIN 3.0* 2.7*   No results for input(s): LIPASE, AMYLASE in the last 168 hours. No results for input(s): AMMONIA in the last 168 hours. Coagulation Profile: Recent Labs  Lab 07/22/18 1940  INR 0.92   Cardiac Enzymes: No results for input(s): CKTOTAL, CKMB, CKMBINDEX, TROPONINI in the last 168 hours. BNP (last 3 results) No results for input(s): PROBNP in the last 8760 hours. HbA1C: No results for input(s): HGBA1C in the last 72 hours. CBG: No results for input(s): GLUCAP in the last 168 hours. Lipid Profile: No results for input(s): CHOL, HDL, LDLCALC, TRIG, CHOLHDL, LDLDIRECT in the last 72 hours. Thyroid Function Tests: No results for input(s): TSH, T4TOTAL, FREET4, T3FREE, THYROIDAB in the last 72 hours. Anemia Panel: No results for input(s): VITAMINB12, FOLATE, FERRITIN, TIBC, IRON, RETICCTPCT in  the last 72 hours. Sepsis Labs: Recent Labs  Lab 07/22/18 1944  LATICACIDVEN 1.12    Recent Results (from the past 240 hour(s))  Blood culture (routine x 2)     Status: None (Preliminary result)   Collection Time: 07/22/18  7:30 PM  Result Value Ref Range Status   Specimen Description BLOOD RIGHT ANTECUBITAL  Final   Special Requests   Final    BOTTLES DRAWN AEROBIC AND ANAEROBIC Blood Culture adequate volume   Culture   Final    NO GROWTH 3 DAYS Performed at [facility], [address] [place], [place] [address]    Report Status PENDING  Incomplete  Blood culture (routine x 2)     Status: None (Preliminary result)   Collection Time: 07/22/18  8:25 PM  Result Value Ref Range Status   Specimen Description BLOOD RIGHT ARM  Final   Special Requests   Final    BOTTLES DRAWN AEROBIC AND ANAEROBIC Blood  Culture adequate volume   Culture   Final    NO GROWTH 3 DAYS Performed at [facility], [place] [address] [place], [place] [address]    Report Status PENDING  Incomplete     Radiology Studies: Vas [place] Lower Extremity Venous (dvt)  Result Date: 07/24/2018  Lower Venous Study Indications: Pain, Swelling, Erythema, and cellulitis.  Risk Factors: Lymphedema. Limitations: Body habitus and edema. Comparison Study: Negative study done 12/29/15 is available for comparison Performing Technologist: Sharion Dove RVS  Examination Guidelines: A complete evaluation includes B-mode imaging, spectral Doppler, color Doppler, and power Doppler as needed of all accessible portions of each vessel. Bilateral testing is considered an integral part of a complete examination. Limited examinations for reoccurring indications may be performed as noted.  Right Venous Findings: +---+---------------+---------+-----------+----------+-------+    CompressibilityPhasicitySpontaneityPropertiesSummary +---+---------------+---------+-----------+----------+-------+ CFVFull           Yes      Yes                           +---+---------------+---------+-----------+----------+-------+  Left Venous Findings: +---------+---------------+---------+-----------+----------+-------+          CompressibilityPhasicitySpontaneityPropertiesSummary +---------+---------------+---------+-----------+----------+-------+ CFV      Full                                                 +---------+---------------+---------+-----------+----------+-------+ SFJ      Full                                                 +---------+---------------+---------+-----------+----------+-------+ FV Prox  Full                                                 +---------+---------------+---------+-----------+----------+-------+ FV Mid   Full                                                 +---------+---------------+---------+-----------+----------+-------+ FV DistalFull                                                 +---------+---------------+---------+-----------+----------+-------+ PFV      Full                                                 +---------+---------------+---------+-----------+----------+-------+ POP      Full                                                 +---------+---------------+---------+-----------+----------+-------+ PTV      Full                                                 +---------+---------------+---------+-----------+----------+-------+  PERO     Full                                                 +---------+---------------+---------+-----------+----------+-------+ GSV      Full                                                 +---------+---------------+---------+-----------+----------+-------+  Left Technical Findings: Not visualized segments include not all portions of the peroneal or posterior tibial vein.   Summary: Right: No evidence of common femoral vein obstruction. Left: There is no evidence of deep vein thrombosis in the lower  extremity. However, portions of this examination were limited- see technologist comments above. Ultrasound characteristics of enlarged lymph nodes noted in the groin.  *See table(s) above for measurements and observations. Electronically signed by Monica Martinez MD on 07/24/2018 at 3:55:43 PM.    Final     Scheduled Meds: . aspirin EC  81 mg Oral Daily  . celecoxib  100 mg Oral BID  . enoxaparin (LOVENOX) injection  40 mg Subcutaneous Q24H  . furosemide  40 mg Intravenous Daily  . ipratropium  2 spray Nasal Daily  . latanoprost  1 drop Both Eyes QHS  . mirabegron ER  25 mg Oral Daily  . pantoprazole  40 mg Oral Daily   Continuous Infusions: .  ceFAZolin (ANCEF) IV 1 g (07/25/18 1343)     LOS: 3 days   Marylu Lund, MD [facility] Pager On Amion  If 7PM-7AM, please contact night-coverage 07/25/2018, 4:07 PM

## 2018-07-25 NOTE — Evaluation (Signed)
Occupational Therapy Evaluation Patient Details Name: Jessica Yates MRN: [id number] DOB: 12-Feb-1951 Today's Date: 07/25/2018    History of Present Illness 67 y.o. female with medical history significant of lymphedema, previous DVT, asthma, previous cellulitis, depression, anxiety and morbid obesity who presented to the ER with bilateral lower extremity swelling left more than right.    Clinical Impression   PT admitted with LLE cellulitis. Pt currently with functional limitiations due to the deficits listed below (see OT problem list). Pt currently requires BIL UE use to lift L LE but able to reach ankle of LLE. Pt educated on elevation for edema magement, ankle pumps and quad sets when sitting.  Pt will benefit from skilled OT to increase their independence and safety with adls and balance to allow discharge outpatient.  Possible need for wrapping to help with edema management.      Follow Up Recommendations  Outpatient OT(lympedema wrap)    Equipment Recommendations  None recommended by OT    Recommendations for Other Services Other (comment)(possible need for ted hose from [employer] for bil LE)     Precautions / Restrictions Precautions Precautions: Fall Precaution Comments: Fall risk greatly diminished with use of RW Restrictions Weight Bearing Restrictions: No      Mobility Bed Mobility               General bed mobility comments: in chair ona rrival  Transfers Overall transfer level: Needs assistance   Transfers: Sit to/from Stand Sit to Stand: Min guard         General transfer comment: cues for hand placement    Balance                                           ADL either performed or assessed with clinical judgement   ADL Overall ADL's : Needs assistance/impaired Eating/Feeding: Independent   Grooming: Wash/dry hands   Upper Body Bathing: Independent   Lower Body Bathing: Min guard           Toilet Transfer: Min  guard;BSC;RW           Functional mobility during ADLs: Min guard;Rolling walker General ADL Comments: pt normally wears ted hose on bil Le but at this time can not . Questions need for wraps. will make referral to PT to assess for possible appropriateness to wrap     Vision         Perception     Praxis      Pertinent Vitals/Pain Pain Assessment: Faces Faces Pain Scale: Hurts a little bit Pain Location: grimace with movement, likely related to effort Pain Descriptors / Indicators: Grimacing     Hand Dominance     Extremity/Trunk Assessment Upper Extremity Assessment Upper Extremity Assessment: Overall WFL for tasks assessed   Lower Extremity Assessment RLE Deficits / Details: significant edema, erythema, L greater than R LLE Deficits / Details: significant edema, erythema, L greater than R   Cervical / Trunk Assessment Cervical / Trunk Assessment: Normal   Communication Communication Communication: No difficulties   Cognition Arousal/Alertness: Awake/alert Behavior During Therapy: WFL for tasks assessed/performed Overall Cognitive Status: Within Functional Limits for tasks assessed                                     General Comments  L  Le with drainage anteriorly    Exercises     Shoulder Instructions      Home Living Family/patient expects to be discharged to:: Private residence Living Arrangements: Other relatives Available Help at Discharge: Family;Available PRN/intermittently Type of Home: House Home Access: Stairs to enter [employer] of Steps: 3-5 Entrance Stairs-Rails: (need clarification) Home Layout: One level               Home Equipment: Cane - single point   Additional Comments: reports wearing ted hose at home normally       Prior Functioning/Environment Level of Independence: Independent with assistive device(s)                 OT Problem List: Decreased strength;Decreased activity  tolerance;Impaired balance (sitting and/or standing);Pain;Increased edema      OT Treatment/Interventions: Self-care/ADL training;Therapeutic exercise;Energy conservation;DME and/or AE instruction;Manual therapy;Modalities;Therapeutic activities;Balance training;Patient/family education    OT Goals(Current goals can be found in the care plan section) Acute Rehab OT Goals Patient Stated Goal: swelling to go down OT Goal Formulation: With patient Time For Goal Achievement: 08/08/18 Potential to Achieve Goals: Good  OT Frequency: Min 2X/week   Barriers to D/C:            Co-evaluation              AM-PAC PT "6 Clicks" Daily Activity     Outcome Measure Help from another person eating meals?: None Help from another person taking care of personal grooming?: None Help from another person toileting, which includes using toliet, bedpan, or urinal?: None Help from another person bathing (including washing, rinsing, drying)?: A Little Help from another person to put on and taking off regular upper body clothing?: None Help from another person to put on and taking off regular lower body clothing?: A Little 6 Click Score: 22   End of Session Equipment Utilized During Treatment: Rolling walker Nurse Communication: Mobility status;Precautions  Activity Tolerance: Patient tolerated treatment well Patient left: in chair;with call bell/phone within reach  OT Visit Diagnosis: Unsteadiness on feet (R26.81)                Time: 1450-1515 OT Time Calculation (min): 25 min Charges:  OT General Charges $OT Visit: 1 Visit OT Evaluation $OT Eval Moderate Complexity: 1 Mod OT Treatments $Self Care/Home Management : 8-22 mins   Jeri Modena, OTR/L  Acute Rehabilitation Services Pager: [phone] Office: [phone] .   Jeri Modena 07/25/2018, 4:00 PM

## 2018-07-25 NOTE — Plan of Care (Signed)
  Problem: Clinical Measurements: Goal: Ability to maintain clinical measurements within normal limits will improve Outcome: Progressing Goal: Will remain free from infection Outcome: Progressing Goal: Diagnostic test results will improve Outcome: Progressing   Problem: Activity: Goal: Risk for activity intolerance will decrease Outcome: Progressing   Problem: Nutrition: Goal: Adequate nutrition will be maintained Outcome: Progressing   Problem: Pain Managment: Goal: General experience of comfort will improve Outcome: Progressing   Problem: Skin Integrity: Goal: Risk for impaired skin integrity will decrease Outcome: Progressing

## 2018-07-26 ENCOUNTER — Inpatient Hospital Stay (HOSPITAL_COMMUNITY): Payer: Medicare Other

## 2018-07-26 DIAGNOSIS — I5030 Unspecified diastolic (congestive) heart failure: Secondary | ICD-10-CM

## 2018-07-26 LAB — COMPREHENSIVE METABOLIC PANEL
ALT: 22 U/L (ref 0–44)
AST: 31 U/L (ref 15–41)
Albumin: 2.5 g/dL — ABNORMAL LOW (ref 3.5–5.0)
Alkaline Phosphatase: 107 U/L (ref 38–126)
Anion gap: 8 (ref 5–15)
BILIRUBIN TOTAL: 0.4 mg/dL (ref 0.3–1.2)
BUN: 9 mg/dL (ref 8–23)
CO2: 28 mmol/L (ref 22–32)
CREATININE: 1.05 mg/dL — AB (ref 0.44–1.00)
Calcium: 9.4 mg/dL (ref 8.9–10.3)
Chloride: 100 mmol/L (ref 98–111)
GFR calc Af Amer: >60 mL/min (ref >60)
GFR, EST NON AFRICAN AMERICAN: 54 mL/min — AB (ref >60)
Glucose, Bld: 169 mg/dL — ABNORMAL HIGH (ref 70–99)
Potassium: 3.7 mmol/L (ref 3.5–5.1)
Sodium: 136 mmol/L (ref 135–145)
TOTAL PROTEIN: 6.3 g/dL — AB (ref 6.5–8.1)

## 2018-07-26 LAB — CBC
HCT: 30.3 % — ABNORMAL LOW (ref 36.0–46.0)
Hemoglobin: 9.4 g/dL — ABNORMAL LOW (ref 12.0–15.0)
MCH: 25 pg — AB (ref 26.0–34.0)
MCHC: 31 g/dL (ref 30.0–36.0)
MCV: 80.6 fL (ref 80.0–100.0)
Platelets: 308 10*3/uL (ref 150–400)
RBC: 3.76 MIL/uL — ABNORMAL LOW (ref 3.87–5.11)
RDW: 14.6 % (ref 11.5–15.5)
WBC: 11.1 10*3/uL — AB (ref 4.0–10.5)
nRBC: 0.2 % (ref 0.0–0.2)

## 2018-07-26 LAB — ECHOCARDIOGRAM COMPLETE
HEIGHTINCHES: 67 in
Weight: 3668.45 oz

## 2018-07-26 NOTE — Progress Notes (Signed)
  Echocardiogram 2D Echocardiogram has been performed.  Jessica Yates 07/26/2018, 10:37 AM

## 2018-07-26 NOTE — Care Management Important Message (Signed)
Important Message  Patient Details  Name: Jessica Yates MRN: [id number] Date of Birth: Jan 30, 1951   Medicare Important Message Given:  Yes    Tavita Eastham 07/26/2018, 10:10 AM

## 2018-07-26 NOTE — Progress Notes (Signed)
Physical Therapy Treatment Patient Details Name: Jessica Yates MRN: [id number] DOB: 11/11/1950 Today's Date: 07/26/2018    History of Present Illness 67 y.o. female with medical history significant of lymphedema, previous DVT, asthma, previous cellulitis, depression, anxiety and morbid obesity who presented to the ER with bilateral lower extremity swelling left more than right.     PT Comments    Continuing work on functional mobility and activity tolerance;  Noting very nice progress with walking and mobility; continue to recommend Outpt PT follow up for Lymphedema   Follow Up Recommendations  Outpatient PT;Other (comment)(for Lymphedema)  Jessica Yates mentioned if there was a choice of color, Purple is her first choice, Blue is her second choice -- she would rather not have red, but will take red if that's her only option     Equipment Recommendations  Other (comment)(4 wheeled RW/Rollator)    Recommendations for Other Services       Precautions / Restrictions Precautions Precautions: Fall Precaution Comments: Fall risk greatly diminished with use of RW    Mobility  Bed Mobility Overal bed mobility: Modified Independent                Transfers Overall transfer level: Needs assistance Equipment used: Rolling walker (2 wheeled) Transfers: Sit to/from Stand Sit to Stand: Supervision         General transfer comment: cues for hand placement  Ambulation/Gait Ambulation/Gait assistance: Min guard Gait Distance (Feet): 80 Feet(x2) Assistive device: Rolling walker (2 wheeled) Gait Pattern/deviations: Decreased step length - right;Decreased step length - left;Wide base of support     General Gait Details: Cues to self-monitor for activity tolerance   Stairs             Wheelchair Mobility    Modified Rankin (Stroke Patients Only)       Balance                                            Cognition Arousal/Alertness:  Awake/alert Behavior During Therapy: WFL for tasks assessed/performed Overall Cognitive Status: Within Functional Limits for tasks assessed                                        Exercises      General Comments General comments (skin integrity, edema, etc.): Jessica Yates had lots of questions re: activity at dc, optimal positioning of LEs, and some specific wrapping questions; Answered questions as able and deferred lymphedema-specific questions to Outpt PT      Pertinent Vitals/Pain Pain Assessment: Faces Faces Pain Scale: Hurts a little bit Pain Location: grimace with movement, likely related to effort Pain Descriptors / Indicators: Grimacing;Burning Pain Intervention(s): Monitored during session    Home Living                      Prior Function            PT Goals (current goals can now be found in the care plan section) Acute Rehab PT Goals Patient Stated Goal: swelling to go down PT Goal Formulation: With patient Time For Goal Achievement: 08/07/18 Potential to Achieve Goals: Good Progress towards PT goals: Progressing toward goals    Frequency    Min 3X/week      PT Plan Current  plan remains appropriate    Co-evaluation              AM-PAC PT "6 Clicks" Daily Activity  Outcome Measure  Difficulty turning over in bed (including adjusting bedclothes, sheets and blankets)?: None Difficulty moving from lying on back to sitting on the side of the bed? : None Difficulty sitting down on and standing up from a chair with arms (e.g., wheelchair, bedside commode, etc,.)?: A Little Help needed moving to and from a bed to chair (including a wheelchair)?: A Little Help needed walking in hospital room?: None Help needed climbing 3-5 steps with a railing? : A Lot 6 Click Score: 20    End of Session Equipment Utilized During Treatment: Gait belt Activity Tolerance: Patient tolerated treatment well Patient left: with call bell/phone  within reach;Other (comment)(on commode in bathroom) Nurse Communication: Mobility status PT Visit Diagnosis: Unsteadiness on feet (R26.81);Other abnormalities of gait and mobility (R26.89);Other (comment)(Lymphedema)     Time: 3818-2993 PT Time Calculation (min) (ACUTE ONLY): 31 min  Charges:  $Gait Training: 8-22 mins $Therapeutic Activity: 8-22 mins                     Roney Marion, PT  Acute Rehabilitation Services Pager [phone] Office [place] 07/26/2018, 2:59 PM

## 2018-07-26 NOTE — Progress Notes (Signed)
PROGRESS NOTE    Jessica Yates  OXB:[id number] DOB: 10-06-1950 DOA: 07/22/2018 PCP: Javier Docker, MD    Brief Narrative:  67 y.o. female with medical history significant of lymphedema, previous DVT, asthma, previous cellulitis, depression, anxiety and morbid obesity who presented to the ER with bilateral lower extremity swelling left more than right.  She first went to urgent care center where they were worried about possible blood clot and sent the patient over to the hospital for evaluation.  Has previous DVT back in 2002 and completed anticoagulation.  Patient was hospitalized for further management.  There was concern for cellulitis and so she was placed on IV antibiotics.   Assessment & Plan:   Principal Problem:   Cellulitis and abscess of left leg Active Problems:   Extrinsic asthma   CHF (congestive heart failure) (HCC)   Lymphedema   Left lower extremity cellulitis Patient was started on Ancef.  Patient was also given diuretics with improvement in the swelling.  Cultures have been negative so far.  No DVT noted on Doppler studies.  Anticoagulation was discontinued.  Erythema is improving.    Acute on chronic diastolic CHF Most likely diastolic dysfunction.  Echocardiogram is pending.  Patient was given intravenous Lasix with good diuresis, decrease in weight and improvement in the lower extremity edema.  Continue IV diuretics for additional 24 hours.  Follow-up on echocardiogram.  Follow-up on electrolytes.  History of asthma Seems to be stable.    Chronic lymphedema  Counseled patient to follow-up with the lymphedema clinic.  Continue to keep lower extremity elevated as much as possible.  Compression stockings discussed with the patient and she does use them at home.  Continue IV diuretics for another day.    Normocytic anemia Likely anemia of chronic disease.  No evidence for overt bleeding.   DVT prophylaxis: Lovenox Code Status: Full code Family  Communication: Discussed with the patient Disposition Plan: Seems to be ambulating without any difficulty.  Most likely will be able to go home when ready for discharge which could be as early as 1 to 2 days.  Consultants:   None  Procedures:   None  Antimicrobials: Anti-infectives (From admission, onward)   Start     Dose/Rate Route Frequency Ordered Stop   07/23/18 0200  ceFAZolin (ANCEF) IVPB 1 g/50 mL premix     1 g 100 mL/hr over 30 Minutes Intravenous Every 8 hours 07/22/18 2347     07/22/18 1930  ceFAZolin (ANCEF) IVPB 1 g/50 mL premix     1 g 100 mL/hr over 30 Minutes Intravenous  Once 07/22/18 1926 07/22/18 2113      Subjective: Feels better.  Swelling in the legs has significantly improved.  Right leg is almost back to her baseline.  Left leg close to her baseline.  Objective: Vitals:   07/25/18 1513 07/25/18 2153 07/26/18 0517 07/26/18 0755  BP: 124/74 126/74 125/75   Pulse: 96 100 89   Resp:  20 20   Temp: 97.6 F (36.4 C) 98.1 F (36.7 C) 98.1 F (36.7 C)   TempSrc: Oral Oral Oral   SpO2: 99% 100% 97%   Weight:    104 kg  Height:        Intake/Output Summary (Last 24 hours) at 07/26/2018 1302 Last data filed at 07/26/2018 0057 Gross per 24 hour  Intake -  Output 2200 ml  Net -2200 ml   Filed Weights   07/22/18 1930 07/22/18 2355 07/26/18 0755  Weight: 95.3  kg 107 kg 104 kg    Examination: General exam: Awake alert.  In no distress Respiratory system: Normal effort at rest.  Clear to auscultation bilaterally Cardiovascular system: S1-S2 is normal regular.  No S3-S4.  Rubs murmurs or bruit Gastrointestinal system: Abdomen soft.  Nontender nondistended Central nervous system: Awake alert.  Oriented x3.  No focal neurological deficits. Extremities: Evidence for chronic lymphedema noted bilateral lower extremities, left more than right.  Improving edema.  Mild erythema noted in the left lower extremity.  Data Reviewed: I have personally reviewed  following labs and imaging studies  CBC: Recent Labs  Lab 07/22/18 1940 07/23/18 0403 07/24/18 0157 07/26/18 0144  WBC 9.8 10.6* 11.5* 11.1*  NEUTROABS 7.9*  --   --   --   HGB 10.3* 10.4* 9.8* 9.4*  HCT 33.5* 33.0* 31.6* 30.3*  MCV 83.1 81.3 80.8 80.6  PLT 199 226 260 175   Basic Metabolic Panel: Recent Labs  Lab 07/22/18 1940 07/23/18 0403 07/24/18 0157 07/25/18 0155 07/26/18 0144  NA 138 138 139 138 136  K 3.6 3.7 3.4* 4.0 3.7  CL 103 105 102 104 100  CO2 26 27 29 28 28   GLUCOSE 136* 141* 119* 183* 169*  BUN 8 6* <5* 6* 9  CREATININE 0.98 1.00 0.94 1.04* 1.05*  CALCIUM 10.0 9.9 9.4 9.4 9.4   GFR: Estimated Creatinine Clearance: 64.5 mL/min (A) (by C-G formula based on SCr of 1.05 mg/dL (H)). Liver Function Tests: Recent Labs  Lab 07/23/18 0403 07/24/18 0157 07/26/18 0144  AST 38 28 31  ALT 31 25 22   ALKPHOS 110 123 107  BILITOT 0.6 0.5 0.4  PROT 7.0 6.4* 6.3*  ALBUMIN 3.0* 2.7* 2.5*   No results for input(s): LIPASE, AMYLASE in the last 168 hours. No results for input(s): AMMONIA in the last 168 hours. Coagulation Profile: Recent Labs  Lab 07/22/18 1940  INR 0.92   Sepsis Labs: Recent Labs  Lab 07/22/18 1944  LATICACIDVEN 1.12    Recent Results (from the past 240 hour(s))  Blood culture (routine x 2)     Status: None (Preliminary result)   Collection Time: 07/22/18  7:30 PM  Result Value Ref Range Status   Specimen Description BLOOD RIGHT ANTECUBITAL  Final   Special Requests   Final    BOTTLES DRAWN AEROBIC AND ANAEROBIC Blood Culture adequate volume   Culture   Final    NO GROWTH 4 DAYS Performed at [facility], [place] [address], [place], [place] [address]    Report Status PENDING  Incomplete  Blood culture (routine x 2)     Status: None (Preliminary result)   Collection Time: 07/22/18  8:25 PM  Result Value Ref Range Status   Specimen Description BLOOD RIGHT ARM  Final   Special Requests   Final    BOTTLES DRAWN AEROBIC AND  ANAEROBIC Blood Culture adequate volume   Culture   Final    NO GROWTH 4 DAYS Performed at [facility], [place] [address] [place], [place] [address]    Report Status PENDING  Incomplete     Radiology Studies: No results found.  Scheduled Meds: . aspirin EC  81 mg Oral Daily  . celecoxib  100 mg Oral BID  . enoxaparin (LOVENOX) injection  40 mg Subcutaneous Q24H  . furosemide  40 mg Intravenous BID  . ipratropium  2 spray Nasal Daily  . latanoprost  1 drop Both Eyes QHS  . mirabegron ER  25  mg Oral Daily  . pantoprazole  40 mg Oral Daily   Continuous Infusions: .  ceFAZolin (ANCEF) IV 1 g (07/26/18 0519)     LOS: 4 days   Bonnielee Haff, MD [facility] Pager On Amion  If 7PM-7AM, please contact night-coverage 07/26/2018, 1:02 PM

## 2018-07-27 LAB — CULTURE, BLOOD (ROUTINE X 2)
CULTURE: NO GROWTH
Culture: NO GROWTH
Special Requests: ADEQUATE
Special Requests: ADEQUATE

## 2018-07-27 LAB — BASIC METABOLIC PANEL
Anion gap: 9 (ref 5–15)
BUN: 11 mg/dL (ref 8–23)
CALCIUM: 9.6 mg/dL (ref 8.9–10.3)
CHLORIDE: 102 mmol/L (ref 98–111)
CO2: 27 mmol/L (ref 22–32)
CREATININE: 0.83 mg/dL (ref 0.44–1.00)
GFR calc Af Amer: >60 mL/min (ref >60)
GFR calc non Af Amer: >60 mL/min (ref >60)
Glucose, Bld: 146 mg/dL — ABNORMAL HIGH (ref 70–99)
Potassium: 4.1 mmol/L (ref 3.5–5.1)
Sodium: 138 mmol/L (ref 135–145)

## 2018-07-27 MED ORDER — POTASSIUM CHLORIDE CRYS ER 20 MEQ PO TBCR
20.0000 meq | EXTENDED_RELEASE_TABLET | Freq: Once | ORAL | Status: AC
  Administered 2018-07-27: 20 meq via ORAL
  Filled 2018-07-27: qty 1

## 2018-07-27 MED ORDER — CEPHALEXIN 500 MG PO CAPS
500.0000 mg | ORAL_CAPSULE | Freq: Three times a day (TID) | ORAL | Status: DC
  Administered 2018-07-27 – 2018-07-28 (×4): 500 mg via ORAL
  Filled 2018-07-27 (×6): qty 1

## 2018-07-27 NOTE — Progress Notes (Signed)
Physical Therapy Treatment Patient Details Name: Jessica Yates MRN: [id number] DOB: [birth year] Today's Date: 07/27/2018    History of Present Illness 67 y.o. female with medical history significant of lymphedema, previous DVT, asthma, previous cellulitis, depression, anxiety and morbid obesity who presented to the ER with bilateral lower extremity swelling left more than right.     PT Comments    Patient with lymphedema and continued drainage and blistering on L LE.  Feel she needs lymphedema management, but not appropriate for wrap while in acute care due to likely d/c home soon and with continued drainage.  Feel she may likely be best served at [facility] (fax # [phone] with insurance and demographics) for lymphedema management due to location.  Will follow up if not d/c.   Follow Up Recommendations  Outpatient PT;Other (comment)(for lymphedema)     Equipment Recommendations  Other (comment)( wheel walker/rollator)    Recommendations for Other Services       Precautions / Restrictions Precautions Precautions: Fall    Mobility  Bed Mobility Overal bed mobility: Modified Independent                Transfers Overall transfer level: Modified independent                  Ambulation/Gait Ambulation/Gait assistance: Supervision Gait Distance (Feet): 10 Feet Assistive device: None Gait Pattern/deviations: Step-through pattern     General Gait Details: in room walking back on her own from the bathroom   Stairs             Wheelchair Mobility    Modified Rankin (Stroke Patients Only)       Balance                                            Cognition Arousal/Alertness: Awake/alert Behavior During Therapy: WFL for tasks assessed/performed Overall Cognitive Status: Within Functional Limits for tasks assessed                                        Exercises Other  Exercises Other Exercises: Lymphedema measurements taken as follows in cm: at 1st MTH R 27.5, L 30; at medial malleolus R 32.4 L 38.6; 10 cm distal to tibial tub R 40, L 50.7; at fibular head R 41.4, L 43; at superior pole of patella R 51, L 48.2    General Comments General comments (skin integrity, edema, etc.): Evaluation for lymphedema management.  Noted still with blister and minimal drainage L mid shin and still with some errythema.  Patient reports may d.c home tomorrow.  Educated in not doing wrappings today due to possible d/c home soon.  discussed options for outpatient management and called [facility]. and discovered they treat  patients who have lymphedema due to cancer only.  [facility] have locations to treat as well as [facility].  Called and got fax number for referral as [place] is closer for patient  Fax referral with demographics and insurance info to [phone].       Pertinent Vitals/Pain Pain Assessment: No/denies pain    Home Living  Prior Function            PT Goals (current goals can now be found in the care plan section) Progress towards PT goals: Progressing toward goals    Frequency    Min 3X/week      PT Plan Current plan remains appropriate    Co-evaluation              AM-PAC PT "6 Clicks" Daily Activity  Outcome Measure  Difficulty turning over in bed (including adjusting bedclothes, sheets and blankets)?: None Difficulty moving from lying on back to sitting on the side of the bed? : None Difficulty sitting down on and standing up from a chair with arms (e.g., wheelchair, bedside commode, etc,.)?: A Little Help needed moving to and from a bed to chair (including a wheelchair)?: A Little Help needed walking in hospital room?: None Help needed climbing 3-5 steps with a railing? : A Lot 6 Click Score: 20    End of Session    Activity Tolerance: Patient tolerated treatment well Patient left: in bed   PT Visit Diagnosis: Unsteadiness on feet (R26.81);Other abnormalities of gait and mobility (R26.89);Other (comment)     Time: 9371-6967 PT Time Calculation (min) (ACUTE ONLY): 50 min  Charges:  $Therapeutic Activity: 8-22 mins $Self Care/Home Management: [address], [place] [phone] 07/27/2018    Reginia Naas 07/27/2018, 5:06 PM

## 2018-07-27 NOTE — Progress Notes (Signed)
PROGRESS NOTE    Jessica Yates  EHU:[id number] DOB: [birth year] DOA: 07/22/2018 PCP: Javier Docker, MD    Brief Narrative:  67 y.o. female with medical history significant of lymphedema, previous DVT, asthma, previous cellulitis, depression, anxiety and morbid obesity who presented to the ER with bilateral lower extremity swelling left more than right.  She first went to urgent care center where they were worried about possible blood clot and sent the patient over to the hospital for evaluation.  Has previous DVT back in 2002 and completed anticoagulation.  Patient was hospitalized for further management.  There was concern for cellulitis and so she was placed on IV antibiotics.   Assessment & Plan:   Principal Problem:   Cellulitis and abscess of left leg Active Problems:   Extrinsic asthma   CHF (congestive heart failure) (HCC)   Lymphedema   Left lower extremity cellulitis Patient was started on Ancef.  Patient was also given diuretics with improvement in the swelling.  Cultures have been negative so far.  No DVT noted on Doppler studies.  Erythema is improving gradually.  Warmth has improved.  Change to oral Keflex.    Possible acute on chronic diastolic CHF Echocardiogram showed grade 1 diastolic dysfunction.  This by itself most likely not the reason for her lower extremity edema.  Her chronic lymphedema also contributed.  Patient was informed of the findings on echocardiogram.  Systolic function is noted to be normal.  Continue IV diuretics for another 24 hours.  Potassium level is normal this morning.   History of asthma Stable.  Not active issue currently.  Chronic lymphedema  Counseled patient to follow-up with the lymphedema clinic.  Continue to keep lower extremity elevated as much as possible.  Compression stockings discussed with the patient and she does use them at home.  IV diuretics for additional 24 hours.  Normocytic anemia Likely anemia of chronic  disease.  No evidence for overt bleeding.   DVT prophylaxis: Lovenox Code Status: Full code Family Communication: Discussed with the patient Disposition Plan: Management as outlined above.  Possible discharge in 24 hours.  Consultants:   None  Procedures:   None  Antimicrobials: Anti-infectives (From admission, onward)   Start     Dose/Rate Route Frequency Ordered Stop   07/23/18 0200  ceFAZolin (ANCEF) IVPB 1 g/50 mL premix     1 g 100 mL/hr over 30 Minutes Intravenous Every 8 hours 07/22/18 2347     07/22/18 1930  ceFAZolin (ANCEF) IVPB 1 g/50 mL premix     1 g 100 mL/hr over 30 Minutes Intravenous  Once 07/22/18 1926 07/22/18 2113      Subjective: Patient states that she is feeling better.  The right lower extremity swelling has significantly improved.  Continues to have swelling over the left lower extremity which is better but persist..  Objective: Vitals:   07/26/18 0755 07/26/18 1358 07/26/18 2053 07/27/18 0438  BP:  116/72 118/71 101/65  Pulse:  (!) 118 (!) 101 82  Resp:   18   Temp:  98.2 F (36.8 C) 98.3 F (36.8 C) 98.4 F (36.9 C)  TempSrc:  Oral Oral Oral  SpO2:  98% 100% 97%  Weight: 104 kg     Height:        Intake/Output Summary (Last 24 hours) at 07/27/2018 0927 Last data filed at 07/27/2018 0500 Gross per 24 hour  Intake 591.25 ml  Output 800 ml  Net -208.75 ml   [employer]  07/22/18 1930 07/22/18 2355 07/26/18 0755  Weight: 95.3 kg 107 kg 104 kg    Examination: General exam: Awake alert.  In no distress Respiratory system: Normal effort at rest.  Clear to auscultation bilaterally Cardiovascular system: S1-S2 is normal regular.  No S3-S4.  No rubs murmurs or bruit Gastrointestinal system: Abdomen soft.  Nontender nondistended Central nervous system: Awake alert.  Oriented x3.  No focal neurological deficits Extremities: Evidence for chronic lymphedema noted bilateral lower extremities, left more than right.  Erythema and edema  improving in the left leg.  Left is larger than right.  Hyperpigmentation noted.  Both the feet are warm to palpation.  Difficult to palpate pulses due to the swelling.   Studies: Transthoracic echocardiogram Study Conclusions  - Left ventricle: The cavity size was normal. Wall thickness was   normal. Systolic function was normal. The estimated ejection   fraction was in the range of 55% to 60%. Wall motion was normal;   there were no regional wall motion abnormalities. Doppler   parameters are consistent with abnormal left ventricular   relaxation (grade 1 diastolic dysfunction). The E/e&' ratio is   between 8-15, suggesting inderminate LV filling pressure. - Mitral valve: Mildly thickened leaflets . There was trivial   regurgitation. - Left atrium: The atrium was normal in size. - Inferior vena cava: The vessel was normal in size. The   respirophasic diameter changes were in the normal range (>= 50%),   consistent with normal central venous pressure.  Impressions:  - LVEF 55-60%, normal wall thickness, normal wall motion, grade 1   DD, indeterminate LV filling pressure, trivial MR, normal LA   size, normal IVC.   Data Reviewed: I have personally reviewed following labs and imaging studies  CBC: Recent Labs  Lab 07/22/18 1940 07/23/18 0403 07/24/18 0157 07/26/18 0144  WBC 9.8 10.6* 11.5* 11.1*  NEUTROABS 7.9*  --   --   --   HGB 10.3* 10.4* 9.8* 9.4*  HCT 33.5* 33.0* 31.6* 30.3*  MCV 83.1 81.3 80.8 80.6  PLT 199 226 260 932   Basic Metabolic Panel: Recent Labs  Lab 07/23/18 0403 07/24/18 0157 07/25/18 0155 07/26/18 0144 07/27/18 0808  NA 138 139 138 136 138  K 3.7 3.4* 4.0 3.7 4.1  CL 105 102 104 100 102  CO2 27 29 28 28 27   GLUCOSE 141* 119* 183* 169* 146*  BUN 6* <5* 6* 9 11  CREATININE 1.00 0.94 1.04* 1.05* 0.83  CALCIUM 9.9 9.4 9.4 9.4 9.6   GFR: Estimated Creatinine Clearance: 81.6 mL/min (by C-G formula based on SCr of 0.83 mg/dL).  Liver  Function Tests: Recent Labs  Lab 07/23/18 0403 07/24/18 0157 07/26/18 0144  AST 38 28 31  ALT 31 25 22   ALKPHOS 110 123 107  BILITOT 0.6 0.5 0.4  PROT 7.0 6.4* 6.3*  ALBUMIN 3.0* 2.7* 2.5*   Coagulation Profile: Recent Labs  Lab 07/22/18 1940  INR 0.92   Sepsis Labs: Recent Labs  Lab 07/22/18 1944  LATICACIDVEN 1.12    Recent Results (from the past 240 hour(s))  Blood culture (routine x 2)     Status: None (Preliminary result)   Collection Time: 07/22/18  7:30 PM  Result Value Ref Range Status   Specimen Description BLOOD RIGHT ANTECUBITAL  Final   Special Requests   Final    BOTTLES DRAWN AEROBIC AND ANAEROBIC Blood Culture adequate volume   Culture   Final    NO GROWTH 4 DAYS Performed at  [facility], [place] [address] [place], [place] [address]    Report Status PENDING  Incomplete  Blood culture (routine x 2)     Status: None (Preliminary result)   Collection Time: 07/22/18  8:25 PM  Result Value Ref Range Status   Specimen Description BLOOD RIGHT ARM  Final   Special Requests   Final    BOTTLES DRAWN AEROBIC AND ANAEROBIC Blood Culture adequate volume   Culture   Final    NO GROWTH 4 DAYS Performed at [facility], [place] [address] [place], [place] [address]    Report Status PENDING  Incomplete     Radiology Studies: No results found.  Scheduled Meds: . aspirin EC  81 mg Oral Daily  . celecoxib  100 mg Oral BID  . enoxaparin (LOVENOX) injection  40 mg Subcutaneous Q24H  . furosemide  40 mg Intravenous BID  . ipratropium  2 spray Nasal Daily  . latanoprost  1 drop Both Eyes QHS  . mirabegron ER  25 mg Oral Daily  . pantoprazole  40 mg Oral Daily   Continuous Infusions: .  ceFAZolin (ANCEF) IV 1 g (07/27/18 0500)     LOS: 5 days   Bonnielee Haff, MD [facility] Pager On Amion  If 7PM-7AM, please contact night-coverage 07/27/2018, 9:27 AM

## 2018-07-28 LAB — BASIC METABOLIC PANEL
ANION GAP: 10 (ref 5–15)
BUN: 15 mg/dL (ref 8–23)
CALCIUM: 9.9 mg/dL (ref 8.9–10.3)
CHLORIDE: 99 mmol/L (ref 98–111)
CO2: 27 mmol/L (ref 22–32)
Creatinine, Ser: 1.15 mg/dL — ABNORMAL HIGH (ref 0.44–1.00)
GFR calc non Af Amer: 48 mL/min — ABNORMAL LOW (ref >60)
GFR, EST AFRICAN AMERICAN: 56 mL/min — AB (ref >60)
Glucose, Bld: 142 mg/dL — ABNORMAL HIGH (ref 70–99)
POTASSIUM: 4.1 mmol/L (ref 3.5–5.1)
Sodium: 136 mmol/L (ref 135–145)

## 2018-07-28 MED ORDER — CEPHALEXIN 500 MG PO CAPS: 500.0000 mg | ORAL_CAPSULE | Freq: Three times a day (TID) | ORAL | 0 refills | Status: AC

## 2018-07-28 MED ORDER — FUROSEMIDE 40 MG PO TABS: ORAL_TABLET | ORAL | 0 refills | Status: DC

## 2018-07-28 NOTE — Discharge Summary (Signed)
[facility]  Physician Discharge Summary   Patient ID: Jessica Yates MRN: [id number] DOB/AGE: Jan 13, 1951 67 y.o.  Admit date: 07/22/2018 Discharge date: 07/28/2018  PCP: Javier Docker, MD  DISCHARGE DIAGNOSES:  Worsening lower extremity edema in the setting of chronic lymphedema, improved with diuretics Diastolic dysfunction   RECOMMENDATIONS FOR OUTPATIENT FOLLOW UP: 1. Told to follow-up with primary care provider.  She will need to have blood work to check her electrolytes and renal function next week 2. Outpatient evaluation of anemia if not done previously 3. Outpatient PT and OT   DISCHARGE CONDITION: fair  Diet recommendation: As before  Filed Weights   07/22/18 2355 07/26/18 0755 07/28/18 0500  Weight: 107 kg 104 kg 103.5 kg    INITIAL HISTORY: 67 y.o.femalewith medical history significant oflymphedema, previous DVT, asthma, previous cellulitis, depression, anxiety and morbid obesity who presented to the ER with bilateral lower extremity swelling left more than right. She first went to urgent care center where they were worried about possible blood clot and sent the patient over to the hospital for evaluation.Has previous DVT back in 2002 and completed anticoagulation.  Patient was hospitalized for further management.  There was concern for cellulitis and so she was placed on IV antibiotics.   HOSPITAL COURSE:   Left lower extremity cellulitis Patient was started on Ancef.  Patient was also given diuretics with improvement in the swelling.  Cultures have been negative so far.  No DVT noted on Doppler studies.  Erythema is improving gradually.  Warmth has improved.    Patient will be discharged on Keflex for 5 more days.  Possible acute on chronic diastolic CHF Echocardiogram showed grade 1 diastolic dysfunction.  This by itself most likely not the reason for her lower extremity edema.  Her chronic lymphedema also contributed.  Patient was  informed of the findings on echocardiogram.  Systolic function is noted to be normal.    Patient's edema has improved with IV diuretics.  She will be discharged on twice a day dose of furosemide for 5 days and then once a day as before.  Patient instructed to follow-up with primary care provider for blood work to check her renal function as well as electrolytes next week.  Potassium level is normal this morning.  History of asthma Stable.  Not active issue currently.  Chronic lymphedema Counseled patient to follow-up with the lymphedema clinic.  Continue to keep lower extremity elevated as much as possible.  Compression stockings discussed with the patient and she does use them at home.   Normocytic anemia Likely anemia of chronic disease.  No evidence for overt bleeding.  Outpatient follow-up and evaluation.   Overall stable.  Okay for discharge home today.   PERTINENT LABS:  The results of significant diagnostics from this hospitalization (including imaging, microbiology, ancillary and laboratory) are listed below for reference.    Microbiology: Recent Results (from the past 240 hour(s))  Blood culture (routine x 2)     Status: None   Collection Time: 07/22/18  7:30 PM  Result Value Ref Range Status   Specimen Description BLOOD RIGHT ANTECUBITAL  Final   Special Requests   Final    BOTTLES DRAWN AEROBIC AND ANAEROBIC Blood Culture adequate volume   Culture   Final    NO GROWTH 5 DAYS Performed at [facility], [address] [place], [place] [address]    Report Status 07/27/2018 FINAL  Final  Blood culture (routine x 2)  Status: None   Collection Time: 07/22/18  8:25 PM  Result Value Ref Range Status   Specimen Description BLOOD RIGHT ARM  Final   Special Requests   Final    BOTTLES DRAWN AEROBIC AND ANAEROBIC Blood Culture adequate volume   Culture   Final    NO GROWTH 5 DAYS Performed at [facility], [address] [place], [place] [address]     Report Status 07/27/2018 FINAL  Final     Labs: Basic Metabolic Panel: Recent Labs  Lab 07/24/18 0157 07/25/18 0155 07/26/18 0144 07/27/18 0808 07/28/18 0334  NA 139 138 136 138 136  K 3.4* 4.0 3.7 4.1 4.1  CL 102 104 100 102 99  CO2 29 28 28 27 27   GLUCOSE 119* 183* 169* 146* 142*  BUN <5* 6* 9 11 15   CREATININE 0.94 1.04* 1.05* 0.83 1.15*  CALCIUM 9.4 9.4 9.4 9.6 9.9   Liver Function Tests: Recent Labs  Lab 07/23/18 0403 07/24/18 0157 07/26/18 0144  AST 38 28 31  ALT 31 25 22   ALKPHOS 110 123 107  BILITOT 0.6 0.5 0.4  PROT 7.0 6.4* 6.3*  ALBUMIN 3.0* 2.7* 2.5*   CBC: Recent Labs  Lab 07/22/18 1940 07/23/18 0403 07/24/18 0157 07/26/18 0144  WBC 9.8 10.6* 11.5* 11.1*  NEUTROABS 7.9*  --   --   --   HGB 10.3* 10.4* 9.8* 9.4*  HCT 33.5* 33.0* 31.6* 30.3*  MCV 83.1 81.3 80.8 80.6  PLT 199 226 260 308     IMAGING STUDIES Dg Chest Port 1 View  Result Date: 07/22/2018 CLINICAL DATA:  Chest pain, shortness of breath. EXAM: PORTABLE CHEST 1 VIEW COMPARISON:  Radiographs of March 27, 2011. FINDINGS: Stable cardiomegaly. No pneumothorax or pleural effusion is noted. Both lungs are clear. The visualized skeletal structures are unremarkable. IMPRESSION: No active disease. Electronically Signed   By: Marijo Conception, M.D.   On: 07/22/2018 19:49   Vas [place] Lower Extremity Venous (dvt)  Result Date: 07/24/2018  Lower Venous Study Indications: Pain, Swelling, Erythema, and cellulitis.  Risk Factors: Lymphedema. Limitations: Body habitus and edema. Comparison Study: Negative study done 12/29/15 is available for comparison Performing Technologist: Sharion Dove RVS  Examination Guidelines: A complete evaluation includes B-mode imaging, spectral Doppler, color Doppler, and power Doppler as needed of all accessible portions of each vessel. Bilateral testing is considered an integral part of a complete examination. Limited examinations for reoccurring indications may be performed as  noted.  Right Venous Findings: +---+---------------+---------+-----------+----------+-------+    CompressibilityPhasicitySpontaneityPropertiesSummary +---+---------------+---------+-----------+----------+-------+ CFVFull           Yes      Yes                          +---+---------------+---------+-----------+----------+-------+  Left Venous Findings: +---------+---------------+---------+-----------+----------+-------+          CompressibilityPhasicitySpontaneityPropertiesSummary +---------+---------------+---------+-----------+----------+-------+ CFV      Full                                                 +---------+---------------+---------+-----------+----------+-------+ SFJ      Full                                                 +---------+---------------+---------+-----------+----------+-------+  FV Prox  Full                                                 +---------+---------------+---------+-----------+----------+-------+ FV Mid   Full                                                 +---------+---------------+---------+-----------+----------+-------+ FV DistalFull                                                 +---------+---------------+---------+-----------+----------+-------+ PFV      Full                                                 +---------+---------------+---------+-----------+----------+-------+ POP      Full                                                 +---------+---------------+---------+-----------+----------+-------+ PTV      Full                                                 +---------+---------------+---------+-----------+----------+-------+ PERO     Full                                                 +---------+---------------+---------+-----------+----------+-------+ GSV      Full                                                  +---------+---------------+---------+-----------+----------+-------+  Left Technical Findings: Not visualized segments include not all portions of the peroneal or posterior tibial vein.   Summary: Right: No evidence of common femoral vein obstruction. Left: There is no evidence of deep vein thrombosis in the lower extremity. However, portions of this examination were limited- see technologist comments above. Ultrasound characteristics of enlarged lymph nodes noted in the groin.  *See table(s) above for measurements and observations. Electronically signed by Monica Martinez MD on 07/24/2018 at 3:55:43 PM.    Final     DISCHARGE EXAMINATION: Vitals:   07/27/18 1441 07/27/18 2047 07/28/18 0500 07/28/18 0600  BP: (!) 158/79 113/79  102/61  Pulse: (!) 119 (!) 109  79  Resp: 17 17  18   Temp: 98.3 F (36.8 C) 98.6 F (37 C)  (!) 97.5 F (36.4 C)  TempSrc: Oral Oral  Oral  SpO2: 100% 95%  99%  Weight:   103.5 kg   Height:  General appearance: alert, cooperative, appears stated age and no distress Resp: clear to auscultation bilaterally Cardio: regular rate and rhythm, S1, S2 normal, no murmur, click, rub or gallop GI: soft, non-tender; bowel sounds normal; no masses,  no organomegaly Improved lower extremity edema.  Left leg is noted to be larger than right.  Erythema is improved.  Edema is improved.  DISPOSITION: Home  Discharge Instructions    Ambulatory referral to Physical Therapy   Complete by:  As directed    lymphemdema   Iontophoresis - 4 mg/ml of dexamethasone:  No   T.E.N.S. Unit Evaluation and Dispense as Indicated:  No   Call MD for:  difficulty breathing, headache or visual disturbances   Complete by:  As directed    Call MD for:  extreme fatigue   Complete by:  As directed    Call MD for:  persistant dizziness or light-headedness   Complete by:  As directed    Call MD for:  persistant nausea and vomiting   Complete by:  As directed    Call MD for:  severe  uncontrolled pain   Complete by:  As directed    Call MD for:  temperature >100.4   Complete by:  As directed    Diet - low sodium heart healthy   Complete by:  As directed    Discharge instructions   Complete by:  As directed    Please be sure to establish with a primary care provider.  You will need blood work to check your kidney function and your potassium level in 1 week.  Take your medications as prescribed.  Check your weight on a daily basis.  Seek attention if you gain more than 2 pounds.  You were cared for by a hospitalist during your hospital stay. If you have any questions about your discharge medications or the care you received while you were in the hospital after you are discharged, you can call the unit and asked to speak with the hospitalist on call if the hospitalist that took care of you is not available. Once you are discharged, your primary care physician will handle any further medical issues. Please note that NO REFILLS for any discharge medications will be authorized once you are discharged, as it is imperative that you return to your primary care physician (or establish a relationship with a primary care physician if you do not have one) for your aftercare needs so that they can reassess your need for medications and monitor your lab values. If you do not have a primary care physician, you can call [phone] for a physician referral.   Increase activity slowly   Complete by:  As directed         Allergies as of 07/28/2018   No Known Allergies     Medication List    STOP taking these medications   celecoxib 100 MG capsule Commonly known as:  CELEBREX   famotidine 20 MG tablet Commonly known as:  PEPCID   pantoprazole 40 MG tablet Commonly known as:  PROTONIX   Turmeric 1053 MG Tabs     TAKE these medications   acetaminophen 500 MG tablet Commonly known as:  TYLENOL Take 1,000 mg by mouth every 6 (six) hours as needed for moderate pain.   aspirin 81 MG  tablet Take 81 mg by mouth daily.   cephALEXin 500 MG capsule Commonly known as:  KEFLEX Take 1 capsule (500 mg total) by mouth every 8 (eight) hours for 5  days.   furosemide 40 MG tablet Commonly known as:  LASIX 40mg  twice daily for 5 days, then 40mg  once daily. What changed:    how much to take  how to take this  when to take this  reasons to take this  additional instructions   ipratropium 0.03 % nasal spray Commonly known as:  ATROVENT Place 2 sprays into the nose daily.   metolazone 2.5 MG tablet Commonly known as:  ZAROXOLYN Take 2.5 mg by mouth daily as needed (for fluid).   mirabegron ER 25 MG Tb24 tablet Commonly known as:  MYRBETRIQ Take 1 tablet (25 mg total) by mouth daily.   omeprazole 20 MG capsule Commonly known as:  PRILOSEC Take 20 mg by mouth daily.   potassium chloride SA 20 MEQ tablet Commonly known as:  K-DUR,KLOR-CON Take 20 mEq by mouth daily.   TRAVATAN Z 0.004 % Soln ophthalmic solution Generic drug:  Travoprost (BAK Free) Place 1 drop into both eyes At bedtime.   triamcinolone cream 0.1 % Commonly known as:  KENALOG Apply 1 application topically daily as needed (for irritation).   Vitamin D3 25 MCG (1000 UT) Caps Take 2,000 capsules by mouth daily.            Durable Medical Equipment  (From admission, onward)         Start     Ordered   07/28/18 0851  For home use only DME 4 wheeled rolling walker with seat  Once    Comments:  rollator  Question:  Patient needs a walker to treat with the following condition  Answer:  Lymphedema   07/28/18 0851           Follow-up Information    [facility] Follow up.   Specialty:  Rehabilitation Why:  Will call for appointment  Contact information: [place] [id number] [place] [phone]       [facility] .   Specialty:  Rehabilitation Contact  information: [place] [id number] [place] [phone]          TOTAL DISCHARGE TIME: 41 minutes  Bonnielee Haff  [facility] Pager [phone]  07/28/2018, 2:19 PM

## 2018-07-28 NOTE — Discharge Instructions (Signed)
Lymphedema Lymphedema is swelling that is caused by the abnormal collection of lymph under the skin. Lymph is fluid from the tissues in your body that travels in the lymphatic system. This system is part of the immune system and includes lymph nodes and lymph vessels. The lymph vessels collect and carry the excess fluid, fats, proteins, and wastes from the tissues of the body to the bloodstream. This system also works to clean and remove bacteria and waste products from the body. Lymphedema occurs when the lymphatic system is blocked. When the lymph vessels or lymph nodes are blocked or damaged, lymph does not drain properly, causing an abnormal buildup of lymph. This leads to swelling in the arms or legs. Lymphedema cannot be cured by medicines, but various methods can be used to help reduce the swelling. What are the causes? There are two types of lymphedema. Primary lymphedema is caused by the absence or abnormality of the lymph vessel at birth. Secondary lymphedema is more common. It occurs when the lymph vessel is damaged or blocked. Common causes of lymph vessel blockage include:  Skin infection, such as cellulitis.  Infection by parasites (filariasis).  Injury.  Cancer.  Radiation therapy.  Formation of scar tissue.  Surgery.  What are the signs or symptoms? Symptoms of this condition include:  Swelling of the arm or leg.  A heavy or tight feeling in the arm or leg.  Swelling of the feet, toes, or fingers. Shoes or rings may fit more tightly than before.  Redness of the skin over the affected area.  Limited movement of the affected limb.  Sensitivity to touch or discomfort in the affected limb.  How is this diagnosed? This condition may be diagnosed with:  A physical exam.  Medical history.  Bioimpedance spectroscopy. In this test, painless electrical currents are used to measure fluid levels in your body.  Imaging tests, such as: ? Lymphoscintigraphy. In this  test, a low dose of a radioactive substance is injected to trace the flow of lymph through the lymph vessels. ? MRI. ? CT scan. ? Duplex ultrasound. This test uses sound waves to produce images of the vessels and the blood flow on a screen. ? Lymphangiography. In this test, a contrast dye is injected into the lymph vessel to help show blockages.  How is this treated? Treatment for this condition may depend on the cause. Treatment may include:  Exercise. Certain exercises can help fluid move out of the affected limb.  Massage. Gentle massage of the affected limb can help move the fluid out of the area.  Compression. Various methods may be used to apply pressure to the affected limb in order to reduce the swelling. ? Wearing compression stockings or sleeves on the affected limb. ? Bandaging the affected limb. ? Using an external pump that is attached to a sleeve that alternates between applying pressure and releasing pressure.  Surgery. This is usually only done for severe cases. For example, surgery may be done if you have trouble moving the limb or if the swelling does not get better with other treatments.  If an underlying condition is causing the lymphedema, treatment for that condition is needed. For example, antibiotic medicines may be used to treat an infection. Follow these instructions at home: Activities  Exercise regularly as directed by your health care provider.  Do not sit with your legs crossed.  When possible, keep the affected limb raised (elevated) above the level of your heart.  Avoid carrying things with an  arm that is affected by lymphedema.  Remember that the affected area is more likely to become injured or infected.  Take these steps to help prevent infection: ? Keep the affected area clean and dry. ? Protect your skin from cuts. For example, you should use gloves while cooking or gardening. Do not walk barefoot. If you shave the affected area, use an  Copy. General instructions  Take medicines only as directed by your health care provider.  Eat a healthy diet that includes a lot of fruits and vegetables.  Do not wear tight clothes, shoes, or jewelry.  Do not use heating pads over the affected area.  Avoid having blood pressure checked on the affected limb.  Keep all follow-up visits as directed by your health care provider. This is important. Contact a health care provider if:  You continue to have swelling in your limb.  You have a fever.  You have a cut that does not heal.  You have redness or pain in the affected area.  You have new swelling in your limb that comes on suddenly.  You develop purplish spots or sores (lesions) on your limb. Get help right away if:  You have a skin rash.  You have chills or sweats.  You have shortness of breath. This information is not intended to replace advice given to you by your health care provider. Make sure you discuss any questions you have with your health care provider. Document Released: 06/27/2007 Document Revised: 05/06/2016 Document Reviewed: 08/07/2014 Elsevier Interactive Patient Education  2018 Elsevier Inc.   Cellulitis, Adult Cellulitis is a skin infection. The infected area is usually red and tender. This condition occurs most often in the arms and lower legs. The infection can travel to the muscles, blood, and underlying tissue and become serious. It is very important to get treated for this condition. What are the causes? Cellulitis is caused by bacteria. The bacteria enter through a break in the skin, such as a cut, burn, insect bite, open sore, or crack. What increases the risk? This condition is more likely to occur in people who:  Have a weak defense system (immune system).  Have open wounds on the skin such as cuts, burns, bites, and scrapes. Bacteria can enter the body through these open wounds.  Are older.  Have diabetes.  Have a type of  long-lasting (chronic) liver disease (cirrhosis) or kidney disease.  Use IV drugs.  What are the signs or symptoms? Symptoms of this condition include:  Redness, streaking, or spotting on the skin.  Swollen area of the skin.  Tenderness or pain when an area of the skin is touched.  Warm skin.  Fever.  Chills.  Blisters.  How is this diagnosed? This condition is diagnosed based on a medical history and physical exam. You may also have tests, including:  Blood tests.  Lab tests.  Imaging tests.  How is this treated? Treatment for this condition may include:  Medicines, such as antibiotic medicines or antihistamines.  Supportive care, such as rest and application of cold or warm cloths (cold or warm compresses) to the skin.  Hospital care, if the condition is severe.  The infection usually gets better within 1-2 days of treatment. Follow these instructions at home:  Take over-the-counter and prescription medicines only as told by your health care provider.  If you were prescribed an antibiotic medicine, take it as told by your health care provider. Do not stop taking the antibiotic even if you  start to feel better.  Drink enough fluid to keep your urine clear or pale yellow.  Do not touch or rub the infected area.  Raise (elevate) the infected area above the level of your heart while you are sitting or lying down.  Apply warm or cold compresses to the affected area as told by your health care provider.  Keep all follow-up visits as told by your health care provider. This is important. These visits let your health care provider make sure a more serious infection is not developing. Contact a health care provider if:  You have a fever.  Your symptoms do not improve within 1-2 days of starting treatment.  Your bone or joint underneath the infected area becomes painful after the skin has healed.  Your infection returns in the same area or another area.  You  notice a swollen bump in the infected area.  You develop new symptoms.  You have a general ill feeling (malaise) with muscle aches and pains. Get help right away if:  Your symptoms get worse.  You feel very sleepy.  You develop vomiting or diarrhea that persists.  You notice red streaks coming from the infected area.  Your red area gets larger or turns dark in color. This information is not intended to replace advice given to you by your health care provider. Make sure you discuss any questions you have with your health care provider. Document Released: 06/09/2005 Document Revised: 01/08/2016 Document Reviewed: 07/09/2015 Elsevier Interactive Patient Education  [employer].

## 2018-07-28 NOTE — Care Management Note (Signed)
Case Management Note  Patient Details  Name: Jessica Yates MRN: [id number] Date of Birth: 04-Mar-1951  Subjective/Objective:                    Action/Plan:  Set up with [facility], patient in agreement  Expected Discharge Date:  07/28/18               Expected Discharge Plan:  Home/Self Care  In-House Referral:     Discharge planning Services  CM Consult  Post Acute Care Choice:  Durable Medical Equipment Choice offered to:  Patient  DME Arranged:  Gilford Rile rolling with seat DME Agency:  [place]:  NA [place] Agency:  NA  Status of Service:  Completed, signed off  If discussed at [place] of Stay Meetings, dates discussed:    Additional Comments:  Marilu Favre, RN 07/28/2018, 10:53 AM

## 2018-07-28 NOTE — Progress Notes (Signed)
Occupational Therapy Treatment Patient Details Name: Jessica Yates MRN: [id number] DOB: 1951-03-26 Today's Date: 07/28/2018    History of present illness 67 y.o. female with medical history significant of lymphedema, previous DVT, asthma, previous cellulitis, depression, anxiety and morbid obesity who presented to the ER with bilateral lower extremity swelling left more than right.    OT comments  Pt with noticeable decr in edema in bil LE and with mobility with RW supervision level. Pt with bil Le with ted hose at this time. Pt asking questions about ted hose with zipper but advised to follow up for lymphedema wrapping then ask questions at that time.     Follow Up Recommendations  Outpatient OT    Equipment Recommendations  None recommended by OT    Recommendations for Other Services Other (comment)    Precautions / Restrictions Precautions Precautions: Fall Precaution Comments: Fall risk greatly diminished with use of RW Restrictions Weight Bearing Restrictions: No       Mobility Bed Mobility Overal bed mobility: Modified Independent                Transfers Overall transfer level: Needs assistance Equipment used: Rolling walker (2 wheeled) Transfers: Sit to/from Stand Sit to Stand: Supervision         General transfer comment: pt noted to have LOB on arrival with head turn. pt needs cues for safety with DME    Balance                                           ADL either performed or assessed with clinical judgement   ADL Overall ADL's : Needs assistance/impaired     Grooming: Wash/dry hands;Supervision/safety               Lower Body Dressing: Moderate assistance Lower Body Dressing Details (indicate cue type and reason): moderate (A) to don ted hose. RN to attempt to get a knee high pair to help the patient complete more independently Toilet Transfer: Supervision/safety;RW Toilet Transfer Details (indicate cue type and  reason): pt abandons the RW and needs cues for safety with DME. moving about the room on arrival without DME Toileting- Clothing Manipulation and Hygiene: Supervision/safety               Vision       Perception     Praxis      Cognition Arousal/Alertness: Awake/alert Behavior During Therapy: WFL for tasks assessed/performed Overall Cognitive Status: Within Functional Limits for tasks assessed                                          Exercises     Shoulder Instructions       General Comments L LE remains +2 edema but with less drainage and no redness noted    Pertinent Vitals/ Pain       Pain Assessment: No/denies pain  Home Living                                          Prior Functioning/Environment              Frequency  Min 2X/week  Progress Toward Goals  OT Goals(current goals can now be found in the care plan section)  Progress towards OT goals: Progressing toward goals  Acute Rehab OT Goals Patient Stated Goal: to go home OT Goal Formulation: With patient Time For Goal Achievement: 08/08/18 Potential to Achieve Goals: Good ADL Goals Pt Will Perform Lower Body Dressing: with modified independence;sit to/from stand Additional ADL Goal #1: pt will demonstrate BIL LE elevation for edema management MOD I  Plan Discharge plan remains appropriate    Co-evaluation                 AM-PAC PT "6 Clicks" Daily Activity     Outcome Measure   Help from another person eating meals?: None Help from another person taking care of personal grooming?: None Help from another person toileting, which includes using toliet, bedpan, or urinal?: None Help from another person bathing (including washing, rinsing, drying)?: A Little Help from another person to put on and taking off regular upper body clothing?: None Help from another person to put on and taking off regular lower body clothing?: A Little 6 Click  Score: 22    End of Session Equipment Utilized During Treatment: Rolling walker  OT Visit Diagnosis: Unsteadiness on feet (R26.81)   Activity Tolerance Patient tolerated treatment well   Patient Left in chair;with call bell/phone within reach   Nurse Communication Mobility status;Precautions        Time: [id number])-1137 OT Time Calculation (min): 19 min  Charges: OT General Charges $OT Visit: 1 Visit OT Treatments $Self Care/Home Management : 8-22 mins   Jeri Modena, OTR/L  Acute Rehabilitation Services Pager: [phone] Office: [phone] .    Jeri Modena 07/28/2018, 2:01 PM

## 2018-09-27 ENCOUNTER — Ambulatory Visit: Payer: Medicare Other | Admitting: Vascular Surgery

## 2018-09-28 ENCOUNTER — Encounter: Payer: Self-pay | Admitting: Vascular Surgery

## 2018-11-13 DIAGNOSIS — M25562 Pain in left knee: Secondary | ICD-10-CM | POA: Insufficient documentation

## 2018-11-13 DIAGNOSIS — M25561 Pain in right knee: Secondary | ICD-10-CM | POA: Insufficient documentation

## 2018-11-28 ENCOUNTER — Encounter: Payer: Self-pay | Admitting: Gastroenterology

## 2018-12-26 ENCOUNTER — Telehealth: Payer: Self-pay | Admitting: Emergency Medicine

## 2018-12-26 NOTE — Telephone Encounter (Signed)
Dr. Ardis Hughs,   This patient was scheduled to see Dr. Tarri Glenn on 12-26-2018, she is a former [facility] patient. She requested that she and her brother see you because her mother is your patient. Ok to switch?

## 2018-12-26 NOTE — Telephone Encounter (Signed)
OK with me.  Thanks. 

## 2018-12-27 ENCOUNTER — Ambulatory Visit: Payer: BC Managed Care – PPO | Admitting: Gastroenterology

## 2018-12-27 NOTE — Telephone Encounter (Signed)
Per Dr. Ardis Hughs note, patient scheduled on 01/01/2019.

## 2019-01-01 ENCOUNTER — Telehealth: Payer: Self-pay | Admitting: Gastroenterology

## 2019-01-01 ENCOUNTER — Other Ambulatory Visit: Payer: Self-pay

## 2019-01-01 ENCOUNTER — Ambulatory Visit (INDEPENDENT_AMBULATORY_CARE_PROVIDER_SITE_OTHER): Payer: BC Managed Care – PPO | Admitting: Gastroenterology

## 2019-01-01 ENCOUNTER — Encounter: Payer: Self-pay | Admitting: Gastroenterology

## 2019-01-01 VITALS — BP 126/72 | HR 88 | Ht 67.0 in | Wt 240.0 lb

## 2019-01-01 DIAGNOSIS — R195 Other fecal abnormalities: Secondary | ICD-10-CM | POA: Diagnosis not present

## 2019-01-01 NOTE — Progress Notes (Signed)
This service was provided via virtual visit. Only audio was used.  The patient was located at home.  I was located in my office.  The patient did consent to this virtual visit and is aware of possible charges through their insurance for this visit.  The patient is a new patient.  My certified medical assistant contributed to this visit by contacting the patient by phone 1 or 2 business days prior to the appointment and also followed up on the recommendations I made after the visit.  Time spent on virtual visit: 73min   HPI: This is a pleasant 68 yo woman   Chief complaint is soft stools, change in bowel habits  Started having trouble with her bowels shortly after her mother passes a year ago.  Softer, mushy stools. Also some urgency to go.  No overt bleeding.  Never nocturnal symptoms.  Usually just once a day.  No abdominal pains at all.    Her weight has been stable, actually probably gaining a bit.  Drinks sodas and slushies every day.  Sounds like she eats a lot of takeout food ([place], [place]), fried foods.  Goes to [employer] often. [place] from [employer].  Steak biscuits.  Never cooks food at home (neither she nor her brother who has the same trouble).  She underwent a colonoscopy with Dr. Olevia Perches October 2011 for routine risk colon cancer screening.  The examination was normal.  She was recommended to have a repeat colonoscopy at 10-year interval.   ROS: complete GI ROS as described in HPI, all other review negative.  Constitutional:  No unintentional weight loss   Past Medical History:  Diagnosis Date  . Anemia 2002  . Anxiety   . Arthritis   . Asthma   . Blood clot in vein   . Cellulitis   . Cough   . Depression 02/06/2008  . DVT (deep venous thrombosis) ([place]) 2002   left leg  . Glaucoma   . Lymphedema   . Palpitations   . Peripheral venous insufficiency   . Seasonal allergies   . Sickle cell trait ([place])   . Vitamin D deficiency     Past Surgical History:   Procedure Laterality Date  . COSMETIC SURGERY  1975   nasal reconstruction  . TOTAL ABDOMINAL HYSTERECTOMY  07/2001    Current Outpatient Medications  Medication Sig Dispense Refill  . acetaminophen (TYLENOL) 500 MG tablet Take 1,000 mg by mouth every 6 (six) hours as needed for moderate pain.    Marland Kitchen aspirin 81 MG tablet Take 81 mg by mouth daily.      . benzonatate (TESSALON) 200 MG capsule Take 200 mg by mouth 3 (three) times daily as needed for cough.    . Cholecalciferol (VITAMIN D3) 1000 UNITS CAPS Take 2,000 capsules by mouth daily.     . ferrous sulfate 325 (65 FE) MG tablet Take 325 mg by mouth daily with breakfast.    . furosemide (LASIX) 40 MG tablet 40mg  twice daily for 5 days, then 40mg  once daily. 60 tablet 0  . ipratropium (ATROVENT) 0.03 % nasal spray Place 2 sprays into the nose daily.  6  . mirabegron ER (MYRBETRIQ) 25 MG TB24 tablet Take 1 tablet (25 mg total) by mouth daily. 30 tablet 11  . omeprazole (PRILOSEC) 20 MG capsule Take 20 mg by mouth daily.    . potassium chloride SA (K-DUR,KLOR-CON) 20 MEQ tablet Take 20 mEq by mouth daily.     Jessica Grate  Yates 0.004 % SOLN ophthalmic solution Place 1 drop into both eyes At bedtime.    . triamcinolone cream (KENALOG) 0.1 % Apply 1 application topically daily as needed (for irritation).      No current facility-administered medications for this visit.     Allergies as of 01/01/2019  . (No Known Allergies)    Family History  Problem Relation Age of Onset  . Allergies Mother   . Asthma Mother   . Other Mother        enlarged heart  . Arthritis Mother   . Breast cancer Mother   . Diabetes Mother   . Hypertension Mother   . Cancer Mother        breast  . Hyperlipidemia Mother   . Heart disease Maternal Grandmother   . Breast cancer Maternal Grandmother   . Birth defects Maternal Grandmother        breast  . Cancer Father 60       leukemia  . Allergies Sister   . Arthritis Sister     Social History    Socioeconomic History  . Marital status: Single    Spouse name: Not on file  . Number of children: 0  . Years of education: Not on file  . Highest education level: Not on file  Occupational History  . Occupation: retired from [employer]  . Financial resource strain: Not on file  . Food insecurity:    Worry: Not on file    Inability: Not on file  . Transportation needs:    Medical: Not on file    Non-medical: Not on file  Tobacco Use  . Smoking status: Never Smoker  . Smokeless tobacco: Never Used  Substance and Sexual Activity  . Alcohol use: No  . Drug use: No  . Sexual activity: Never  Lifestyle  . Physical activity:    Days per week: Not on file    Minutes per session: Not on file  . Stress: Not on file  Relationships  . Social connections:    Talks on phone: Not on file    Gets together: Not on file    Attends religious service: Not on file    Active member of club or organization: Not on file    Attends meetings of clubs or organizations: Not on file    Relationship status: Not on file  . Intimate partner violence:    Fear of current or ex partner: Not on file    Emotionally abused: Not on file    Physically abused: Not on file    Forced sexual activity: Not on file  Other Topics Concern  . Not on file  Social History Narrative   Lives with mother and brother           Physical Exam: Unable to perform because this was a "telemed visit" due to current Covid-19 pandemic BP 126/72   Pulse 88   Ht 5\' 7"  (1.702 m)   Wt 240 lb (108.9 kg)   BMI 37.59 kg/m    Assessment and plan: 68 y.o. female with change in bowel habits, soft stools  She and also her brother noticed changes in their bowels about a year ago shortly after their mom passed away.  I was quite struck by the fact that they never cooked food at their house where they both live.  She and her brother strictly eat out, mostly fast food.  She has never seen blood  in her stool.  She drinks a lot of sodas and slushy's.  I suspect her bowel issues are dietary related, possibly functional issues as well.  I did recommend a work-up including stool test and blood test and if those are not helpful then recommend that we proceed with colonoscopy to exclude other potential causes.  Please see the "Patient Instructions" section for addition details about the plan.  Owens Loffler, MD [facility] 01/01/2019, 2:24 PM

## 2019-01-01 NOTE — Patient Instructions (Addendum)
Blood test and stool tests to work-up his chronic loose stools, diarrhea: Sed rate, total IgA level, TTG, TSH, stool for GI pathogen panel and also for fecal leukocytes.  Pending those results she may need a colonoscopy and she understands that.  She knows it will be safe to try imodium one pill once daily for now. She already has some at home.

## 2019-01-01 NOTE — Telephone Encounter (Signed)
Pt forgot to ask Dr. Ardis Hughs what food she can eat given her problems with her bms, she also wants to know the sxs for UC and crohns.

## 2019-01-01 NOTE — Telephone Encounter (Signed)
Spoke with patient about what she and her brother should eat. This RN stressed the importance of not eating fast food and to start eating a balanced diet with fruits and vegetables with meals that are cooked at home.

## 2019-02-21 ENCOUNTER — Ambulatory Visit (HOSPITAL_COMMUNITY)
Admission: EM | Admit: 2019-02-21 | Discharge: 2019-02-21 | Disposition: A | Discharge status: Home / Self Care | Payer: BC Managed Care – PPO | Attending: Family Medicine | Admitting: Family Medicine

## 2019-02-21 ENCOUNTER — Other Ambulatory Visit: Payer: Self-pay

## 2019-02-21 ENCOUNTER — Encounter (HOSPITAL_COMMUNITY): Payer: Self-pay | Admitting: Emergency Medicine

## 2019-02-21 DIAGNOSIS — L03115 Cellulitis of right lower limb: Secondary | ICD-10-CM

## 2019-02-21 MED ORDER — AMOXICILLIN-POT CLAVULANATE 875-125 MG PO TABS: 1.0000 | ORAL_TABLET | Freq: Two times a day (BID) | ORAL | 0 refills | Status: AC

## 2019-02-21 NOTE — ED Provider Notes (Signed)
[place]    CSN: [id number] Arrival date & time: 02/21/19  1942     History   Chief Complaint Chief Complaint  Patient presents with  . Leg Swelling    HPI Jessica Yates is a 68 y.o. female with history of chronic bilateral lower extremity lymphedema presenting for acute concern of right leg swelling, redness, shininess.  Patient states she noticed this last night when she was washing her legs.  Patient states that her sister routinely wraps her legs for lymphedema.  Patient endorsing swelling, redness, mild tenderness.  Patient denies fever, recent illness, recent hospitalization or surgery, cough, hemoptysis, chest pain, shortness of breath.  Patient denies trauma to her leg, recent fall, open wound, claudication, change in medication.  She is largely concerned because of hospitalization in November 2019 due to cellulitis and states "I do not want to go through all that again".    Past Medical History:  Diagnosis Date  . Anemia 2002  . Anxiety   . Arthritis   . Asthma   . Blood clot in vein   . Cellulitis   . Cough   . Depression 02/06/2008  . DVT (deep venous thrombosis) ([place]) 2002   left leg  . Glaucoma   . Lymphedema   . Palpitations   . Peripheral venous insufficiency   . Seasonal allergies   . Sickle cell trait ([place])   . Vitamin D deficiency     Patient Active Problem List   Diagnosis Date Noted  . Cellulitis and abscess of left leg 07/22/2018  . Cough 05/07/2016  . Upper airway cough syndrome 05/07/2016  . Sebaceous cyst of labia 04/19/2016  . OAB (overactive bladder) 04/19/2016  . 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 Lymphedema 05/03/2012  . CHF (congestive heart failure) ([place]) 04/16/2011  . SICKLE-CELL TRAIT 08/01/2007  . UNSPECIFIED GLAUCOMA 08/01/2007  . DVT 08/01/2007  . VENOUS INSUFFICIENCY 08/01/2007  . ALLERGIC RHINITIS 08/01/2007  . Extrinsic asthma 08/01/2007    Past Surgical History:   Procedure Laterality Date  . COSMETIC SURGERY  1975   nasal reconstruction  . TOTAL ABDOMINAL HYSTERECTOMY  07/2001    OB History    Gravida  0   Para  0   Term  0   Preterm  0   AB  0   Living  0     SAB  0   TAB  0   Ectopic  0   Multiple  0   Live Births  0            Home Medications    Prior to Admission medications   Medication Sig Start Date End Date Taking? Authorizing Provider  Bioflavonoid Products (BIOFLEX PO) Take by mouth.   Yes [provider]  TURMERIC PO Take by mouth.   Yes [provider]  acetaminophen (TYLENOL) 500 MG tablet Take 1,000 mg by mouth every 6 (six) hours as needed for moderate pain.    [provider]  amoxicillin-clavulanate (AUGMENTIN) 875-125 MG tablet Take 1 tablet by mouth every 12 (twelve) hours for 10 days. 02/21/19 03/03/19  Hall-Potvin, [place], PA-C  aspirin 81 MG tablet Take 81 mg by mouth daily.      [provider]  benzonatate (TESSALON) 200 MG capsule Take 200 mg by mouth 3 (three) times daily as needed for cough.    [provider]  Cholecalciferol (VITAMIN D3) 1000 UNITS CAPS Take 2,000 capsules by mouth daily.  [provider]  ferrous sulfate 325 (65 FE) MG tablet Take 325 mg by mouth daily with breakfast.    [provider]  furosemide (LASIX) 40 MG tablet 40mg  twice daily for 5 days, then 40mg  once daily. 07/28/18   Bonnielee Haff, MD  ipratropium (ATROVENT) 0.03 % nasal spray Place 2 sprays into the nose daily. 04/18/18   [provider]  mirabegron ER (MYRBETRIQ) 25 MG TB24 tablet Take 1 tablet (25 mg total) by mouth daily. 04/19/16   Terrance Mass, MD  omeprazole (PRILOSEC) 20 MG capsule Take 20 mg by mouth daily.    [provider]  potassium chloride SA (K-DUR,KLOR-CON) 20 MEQ tablet Take 20 mEq by mouth daily.  04/27/16   [provider]  TRAVATAN Z 0.004 % SOLN ophthalmic solution Place 1 drop into both eyes At  bedtime. 03/14/11   [provider]  triamcinolone cream (KENALOG) 0.1 % Apply 1 application topically daily as needed (for irritation).     [provider]    Family History Family History  Problem Relation Age of Onset  . Allergies Mother   . Asthma Mother   . Other Mother        enlarged heart  . Arthritis Mother   . Breast cancer Mother   . Diabetes Mother   . Hypertension Mother   . Cancer Mother        breast  . Hyperlipidemia Mother   . Heart disease Maternal Grandmother   . Breast cancer Maternal Grandmother   . Birth defects Maternal Grandmother        breast  . Cancer Father 19       leukemia  . Allergies Sister   . Arthritis Sister     Social History Social History   Tobacco Use  . Smoking status: Never Smoker  . Smokeless tobacco: Never Used  Substance Use Topics  . Alcohol use: No  . Drug use: No     Allergies   Patient has no known allergies.   Review of Systems As per HPI   Physical Exam Triage Vital Signs ED Triage Vitals  Enc Vitals Group     BP 02/21/19 1954 102/61     Pulse Rate 02/21/19 1954 (!) 113     Resp 02/21/19 1954 20     Temp 02/21/19 1954 97.9 F (36.6 C)     Temp Source 02/21/19 1954 Temporal     SpO2 02/21/19 1954 100 %     Weight --      Height --      Head Circumference --      Peak Flow --      Pain Score 02/21/19 1955 4     Pain Loc --      Pain Edu? --      Excl. in [place]? --    No data found.  Updated Vital Signs BP 102/61 (BP Location: Right Arm)   Pulse (!) 113   Temp 97.9 F (36.6 C) (Temporal)   Resp 20   SpO2 100%   Visual Acuity Right Eye Distance:   Left Eye Distance:   Bilateral Distance:    Right Eye Near:   Left Eye Near:    Bilateral Near:     Physical Exam Vitals signs reviewed.  Constitutional:      General: She is not in acute distress.    Appearance: She is obese. She is not ill-appearing, toxic-appearing or diaphoretic.  HENT:  Head: Normocephalic and  atraumatic.     Mouth/Throat:     Mouth: Mucous membranes are moist.     Pharynx: Oropharynx is clear.  Eyes:     General: No scleral icterus.    Conjunctiva/sclera: Conjunctivae normal.     Pupils: Pupils are equal, round, and reactive to light.  Cardiovascular:     Rate and Rhythm: Normal rate and regular rhythm.     Pulses: Normal pulses.     Comments: Repeat HR per provider: 97 bpm. Pulmonary:     Effort: Pulmonary effort is normal. No respiratory distress.     Breath sounds: No wheezing.  Skin:    Coloration: Skin is not jaundiced or pale.     Comments: Significant lower extremity edema second to chronic with splotchy hyperpigmentation, rubor, and trace peau d'orange to knees bilaterally.  Appearance is largely consistent with chronic venous stasis.  Right leg slightly swollen as compared to left with trace underlying erythema of lateral aspect that is slightly warm to touch.  Patient is diffusely tender bilaterally, denies significant endorses mildly increased pain with gentle palpation of area of erythema on leg.  No open wound wounds, discharge, or weeping.  No cords palpated, negative Homans sign.  Overall, exam limited second to chronic BLE and pulses are difficult to appreciate, though overall skin is not cold to touch and blanches  Neurological:     General: No focal deficit present.     Mental Status: She is alert and oriented to person, place, and time.  Psychiatric:        Mood and Affect: Mood normal.        Thought Content: Thought content normal.      UC Treatments / Results  Labs (all labs ordered are listed, but only abnormal results are displayed) Labs Reviewed - No data to display  EKG None  Radiology No results found.  Procedures Procedures (including critical care time)  Medications Ordered in UC Medications - No data to display  Initial Impression / Assessment and Plan / UC Course  I have reviewed the triage vital signs and the nursing notes.   Pertinent labs & imaging results that were available during my care of the patient were reviewed by me and considered in my medical decision making (see chart for details).     68 year old female with chronic bilateral lower extremity lymphedema, remote history of cellulitis of left leg presenting for increased swelling and pain in right lower extremity.  Exam is limited second to significant chronic edema.  Chronic venous stasis hyperpigmentation and rubor noted, concern for beginning of cellulitis second to increased sheen, redness, superficial tenderness.  Lower concern for blood clot at this time, though discussed these return precautions with patient who verbalized understanding.   Discussed case with Dr. Mannie Stabile, who agrees with plan: We initiate 10-day Augmentin course, continue wrapping legs for chronic lymphedema, and follow-up with PCP in 1 week: Patient to call tomorrow.  Strict return precautions discussed, patient verbalized understanding. Final Clinical Impressions(s) / UC Diagnoses   Final diagnoses:  Cellulitis of right lower extremity     Discharge Instructions     Take antibiotic as directed.  Go to ER if your leg swelling, pain, or redness, worsens, and/or you develop fever, chest pain, or difficulty breathing.    ED Prescriptions    Medication Sig Dispense Auth. Provider   amoxicillin-clavulanate (AUGMENTIN) 875-125 MG tablet Take 1 tablet by mouth every 12 (twelve) hours for 10 days. 20 tablet Hall-Potvin,  [place], PA-C     Controlled Substance Prescriptions [place] Controlled Substance Registry consulted? Not Applicable   Quincy Sheehan, [place] 02/21/19 2141

## 2019-02-21 NOTE — Discharge Instructions (Addendum)
Take antibiotic as directed.  Go to ER if your leg swelling, pain, or redness, worsens, and/or you develop fever, chest pain, or difficulty breathing.

## 2019-02-21 NOTE — ED Notes (Signed)
Patient able to ambulate independently  

## 2019-02-21 NOTE — ED Triage Notes (Signed)
Pt presents to [facility] for assessment of chronic bilateral leg swelling.  Pt states she noted last night that her right leg was getting more swollen and shiny.  This RN noted redness to right shin.

## 2019-02-22 DIAGNOSIS — H9113 Presbycusis, bilateral: Secondary | ICD-10-CM | POA: Insufficient documentation

## 2019-02-22 DIAGNOSIS — H6122 Impacted cerumen, left ear: Secondary | ICD-10-CM | POA: Insufficient documentation

## 2019-02-22 DIAGNOSIS — K219 Gastro-esophageal reflux disease without esophagitis: Secondary | ICD-10-CM | POA: Insufficient documentation

## 2019-03-09 ENCOUNTER — Encounter (HOSPITAL_COMMUNITY): Payer: Self-pay | Admitting: Emergency Medicine

## 2019-03-09 ENCOUNTER — Other Ambulatory Visit: Payer: Self-pay

## 2019-03-09 ENCOUNTER — Ambulatory Visit (HOSPITAL_COMMUNITY)
Admission: EM | Admit: 2019-03-09 | Discharge: 2019-03-09 | Disposition: A | Discharge status: Home / Self Care | Payer: BC Managed Care – PPO | Attending: Family Medicine | Admitting: Family Medicine

## 2019-03-09 DIAGNOSIS — R059 Cough, unspecified: Secondary | ICD-10-CM

## 2019-03-09 DIAGNOSIS — R600 Localized edema: Secondary | ICD-10-CM

## 2019-03-09 DIAGNOSIS — K219 Gastro-esophageal reflux disease without esophagitis: Secondary | ICD-10-CM

## 2019-03-09 DIAGNOSIS — R05 Cough: Secondary | ICD-10-CM | POA: Diagnosis not present

## 2019-03-09 MED ORDER — PANTOPRAZOLE SODIUM 20 MG PO TBEC: 20.0000 mg | DELAYED_RELEASE_TABLET | Freq: Every day | ORAL | 0 refills | Status: DC

## 2019-03-09 MED ORDER — BENZONATATE 200 MG PO CAPS: 200.0000 mg | ORAL_CAPSULE | Freq: Two times a day (BID) | ORAL | 0 refills | Status: DC | PRN

## 2019-03-09 NOTE — ED Provider Notes (Signed)
[place]    CSN: [id number] Arrival date & time: 03/09/19  6063      History   Chief Complaint Chief Complaint  Patient presents with  . Leg Swelling    HPI Jessica Yates is a 68 y.o. female.   HPI  Patient is finished her 10 days of on 02/21/2019.  She thinks her still some redness in her right leg.  She would like it rechecked.  The swelling has gone down.  She has no pain.  She has no fever She elevates her legs occasionally.  She wears wraps on her legs occasionally.  She wears compression stockings when she remembers.  She takes her Lasix couple times a week.  She does not like the Lasix because it makes her urinate.  She does not have a good understanding regarding the chronic venous stasis/lymphedema changes in her legs and how she should be managing this at home. She asks for a refill for Tessalon.  Wants to know if there is anything stronger.  I advised her if this Lavella Lemons is not working she can add a DM product She asked for a different heartburn medicine.  Does not think omeprazole works as well anymore.  States she gets a "sour taste" in her mouth at times.  Will try pantoprazole.  Again, is reminded she needs to call her PCP for refills on her medications  Past Medical History:  Diagnosis Date  . Anemia 2002  . Anxiety   . Arthritis   . Asthma   . Blood clot in vein   . Cellulitis   . Cough   . Depression 02/06/2008  . DVT (deep venous thrombosis) ([place]) 2002   left leg  . Glaucoma   . Lymphedema   . Palpitations   . Peripheral venous insufficiency   . Seasonal allergies   . Sickle cell trait ([place])   . Vitamin D deficiency     Patient Active Problem List   Diagnosis Date Noted  . Cellulitis and abscess of left leg 07/22/2018  . Cough 05/07/2016  . Upper airway cough syndrome 05/07/2016  . Sebaceous cyst of labia 04/19/2016  . OAB (overactive bladder) 04/19/2016  . 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  Lymphedema 05/03/2012  . CHF (congestive heart failure) ([place]) 04/16/2011  . SICKLE-CELL TRAIT 08/01/2007  . UNSPECIFIED GLAUCOMA 08/01/2007  . DVT 08/01/2007  . VENOUS INSUFFICIENCY 08/01/2007  . ALLERGIC RHINITIS 08/01/2007  . Extrinsic asthma 08/01/2007    Past Surgical History:  Procedure Laterality Date  . COSMETIC SURGERY  1975   nasal reconstruction  . TOTAL ABDOMINAL HYSTERECTOMY  07/2001    OB History    Gravida  0   Para  0   Term  0   Preterm  0   AB  0   Living  0     SAB  0   TAB  0   Ectopic  0   Multiple  0   Live Births  0            Home Medications    Prior to Admission medications   Medication Sig Start Date End Date Taking? Authorizing Provider  acetaminophen (TYLENOL) 500 MG tablet Take 1,000 mg by mouth every 6 (six) hours as needed for moderate pain.    [provider]  aspirin 81 MG tablet Take 81 mg by mouth daily.      [provider]  benzonatate (TESSALON) 200 MG capsule Take  1 capsule (200 mg total) by mouth 2 (two) times daily as needed for cough. 03/09/19   Raylene Everts, MD  Bioflavonoid Products (BIOFLEX PO) Take by mouth.    [provider]  Cholecalciferol (VITAMIN D3) 1000 UNITS CAPS Take 2,000 capsules by mouth daily.     [provider]  ferrous sulfate 325 (65 FE) MG tablet Take 325 mg by mouth daily with breakfast.    [provider]  furosemide (LASIX) 40 MG tablet 40mg  twice daily for 5 days, then 40mg  once daily. 07/28/18   Bonnielee Haff, MD  ipratropium (ATROVENT) 0.03 % nasal spray Place 2 sprays into the nose daily. 04/18/18   [provider]  mirabegron ER (MYRBETRIQ) 25 MG TB24 tablet Take 1 tablet (25 mg total) by mouth daily. 04/19/16   Terrance Mass, MD  pantoprazole (PROTONIX) 20 MG tablet Take 1 tablet (20 mg total) by mouth daily. 03/09/19   Raylene Everts, MD  potassium chloride SA (K-DUR,KLOR-CON) 20 MEQ tablet Take 20 mEq by mouth daily.   04/27/16   [provider]  TRAVATAN Z 0.004 % SOLN ophthalmic solution Place 1 drop into both eyes At bedtime. 03/14/11   [provider]  triamcinolone cream (KENALOG) 0.1 % Apply 1 application topically daily as needed (for irritation).     [provider]  TURMERIC PO Take by mouth.    [provider]    Family History Family History  Problem Relation Age of Onset  . Allergies Mother   . Asthma Mother   . Other Mother        enlarged heart  . Arthritis Mother   . Breast cancer Mother   . Diabetes Mother   . Hypertension Mother   . Cancer Mother        breast  . Hyperlipidemia Mother   . Heart disease Maternal Grandmother   . Breast cancer Maternal Grandmother   . Birth defects Maternal Grandmother        breast  . Cancer Father 10       leukemia  . Allergies Sister   . Arthritis Sister     Social History Social History   Tobacco Use  . Smoking status: Never Smoker  . Smokeless tobacco: Never Used  Substance Use Topics  . Alcohol use: No  . Drug use: No     Allergies   Patient has no known allergies.   Review of Systems Review of Systems  Constitutional: Negative for activity change, appetite change, chills and fever.  HENT: Negative for ear pain and sore throat.   Eyes: Negative for pain and visual disturbance.  Respiratory: Positive for cough. Negative for shortness of breath.   Cardiovascular: Positive for leg swelling. Negative for chest pain and palpitations.  Gastrointestinal: Negative for abdominal pain and vomiting.  Genitourinary: Negative for dysuria and hematuria.  Musculoskeletal: Negative for arthralgias and back pain.  Skin: Negative for color change and rash.  Neurological: Positive for headaches. Negative for seizures and syncope.  All other systems reviewed and are negative.    Physical Exam Triage Vital Signs ED Triage Vitals  Enc Vitals Group     BP 03/09/19 1913 110/86     Pulse Rate 03/09/19  1913 (!) 107     Resp 03/09/19 1913 16     Temp 03/09/19 1913 99.2 F (37.3 C)     Temp Source 03/09/19 1913 Oral     SpO2 03/09/19 1913 99 %  Weight --      Height --      Head Circumference --      Peak Flow --      Pain Score 03/09/19 1914 6     Pain Loc --      Pain Edu? --      Excl. in [place]? --    No data found.  Updated Vital Signs BP 110/86   Pulse (!) 107   Temp 99.2 F (37.3 C) (Oral)   Resp 16   SpO2 99%  Physical Exam Constitutional:      General: She is not in acute distress.    Appearance: She is well-developed and normal weight.  HENT:     Head: Normocephalic and atraumatic.     Mouth/Throat:     Mouth: Mucous membranes are moist.     Pharynx: No posterior oropharyngeal erythema.  Eyes:     Conjunctiva/sclera: Conjunctivae normal.     Pupils: Pupils are equal, round, and reactive to light.  Neck:     Musculoskeletal: Normal range of motion.  Cardiovascular:     Rate and Rhythm: Tachycardia present.     Heart sounds: Normal heart sounds.  Pulmonary:     Effort: Pulmonary effort is normal. No respiratory distress.     Breath sounds: Normal breath sounds. No rales.  Abdominal:     General: There is no distension.     Palpations: Abdomen is soft.     Comments: Protuberant  Musculoskeletal: Normal range of motion.     Right lower leg: Edema present.     Left lower leg: Edema present.     Comments: Patient has massive lower extremity edema bilaterally.  Venous stasis changes.  Brawny skin hyperpigmentation.  Long dystrophic nails.  They are symmetric.  No erythema, wound, redness suggesting cellulitis.  Skin:    General: Skin is warm and dry.  Neurological:     Mental Status: She is alert. Mental status is at baseline.  Psychiatric:     Comments: Poor judgment regarding her health condition, and management      UC Treatments / Results  Labs (all labs ordered are listed, but only abnormal results are displayed) Labs Reviewed - No data to  display  EKG None  Radiology No results found.  Procedures Procedures (including critical care time)  Medications Ordered in UC Medications - No data to display  Initial Impression / Assessment and Plan / UC Course  I have reviewed the triage vital signs and the nursing notes.  Pertinent labs & imaging results that were available during my care of the patient were reviewed by me and considered in my medical decision making (see chart for details).     I tried explained to the patient that her chronic edema management is a problem that she can address with her family physician and her vein specialist.  I reminded her that she needs to elevate her legs at night.  Elevate her legs during the day.  Make sure she puts lotion on her legs and takes care of her skin.  Either wrap or put compression stockings on daily.  Take her Lasix daily. Final Clinical Impressions(s) / UC Diagnoses   Final diagnoses:  Bilateral lower extremity edema  Cough  Gastroesophageal reflux disease, esophagitis presence not specified     Discharge Instructions     For the leg swelling: Elevate the foot of your bed Elevate your legs during the day Wrap your legs in the morning or  put on the compression stockings Avoid salt in your diet Take your fluid pill every day There is no infection right now, there is no need for an antibiotic  For the cough: Take Tessalon twice a day If this does not help add a cough medicine with "DM" like Mucinex DM or Delsym  For the heartburn: Stop omeprazole Try pantoprazole If the pantoprazole works [place], call your PCP for refills   ED Prescriptions    Medication Sig Dispense Auth. Provider   pantoprazole (PROTONIX) 20 MG tablet Take 1 tablet (20 mg total) by mouth daily. 30 tablet Raylene Everts, MD   benzonatate (TESSALON) 200 MG capsule Take 1 capsule (200 mg total) by mouth 2 (two) times daily as needed for cough. 30 capsule Raylene Everts, MD      Controlled Substance Prescriptions [place] Controlled Substance Registry consulted? Not Applicable   Raylene Everts, MD 03/09/19 1943

## 2019-03-09 NOTE — Discharge Instructions (Signed)
For the leg swelling: Elevate the foot of your bed Elevate your legs during the day Wrap your legs in the morning or put on the compression stockings Avoid salt in your diet Take your fluid pill every day There is no infection right now, there is no need for an antibiotic  For the cough: Take Tessalon twice a day If this does not help add a cough medicine with "DM" like Mucinex DM or Delsym  For the heartburn: Stop omeprazole Try pantoprazole If the pantoprazole works better, call your PCP for refills

## 2019-03-09 NOTE — ED Triage Notes (Signed)
PT reports cellulitis earlier this month. Completed 10 day course of antitbiocis that ended 6/20. Continued swelling. Takes fluid pills "every few days"

## 2019-05-11 ENCOUNTER — Other Ambulatory Visit: Payer: Self-pay

## 2019-05-11 ENCOUNTER — Ambulatory Visit (HOSPITAL_COMMUNITY)
Admission: EM | Admit: 2019-05-11 | Discharge: 2019-05-11 | Disposition: A | Discharge status: Home / Self Care | Payer: BC Managed Care – PPO | Attending: Emergency Medicine | Admitting: Emergency Medicine

## 2019-05-11 ENCOUNTER — Inpatient Hospital Stay (HOSPITAL_COMMUNITY)
Admission: EM | Admit: 2019-05-11 | Discharge: 2019-05-20 | DRG: 872 | Disposition: A | Discharge status: Home Health | Payer: Medicare HMO | Source: Other Acute Inpatient Hospital | Attending: Internal Medicine | Admitting: Internal Medicine

## 2019-05-11 ENCOUNTER — Encounter (HOSPITAL_COMMUNITY): Payer: Self-pay | Admitting: Emergency Medicine

## 2019-05-11 DIAGNOSIS — F419 Anxiety disorder, unspecified: Secondary | ICD-10-CM | POA: Diagnosis present

## 2019-05-11 DIAGNOSIS — D573 Sickle-cell trait: Secondary | ICD-10-CM | POA: Diagnosis present

## 2019-05-11 DIAGNOSIS — I878 Other specified disorders of veins: Secondary | ICD-10-CM | POA: Diagnosis present

## 2019-05-11 DIAGNOSIS — M7989 Other specified soft tissue disorders: Secondary | ICD-10-CM

## 2019-05-11 DIAGNOSIS — K219 Gastro-esophageal reflux disease without esophagitis: Secondary | ICD-10-CM | POA: Diagnosis present

## 2019-05-11 DIAGNOSIS — Z825 Family history of asthma and other chronic lower respiratory diseases: Secondary | ICD-10-CM

## 2019-05-11 DIAGNOSIS — L0390 Cellulitis, unspecified: Secondary | ICD-10-CM | POA: Diagnosis present

## 2019-05-11 DIAGNOSIS — R5381 Other malaise: Secondary | ICD-10-CM | POA: Diagnosis present

## 2019-05-11 DIAGNOSIS — Z803 Family history of malignant neoplasm of breast: Secondary | ICD-10-CM

## 2019-05-11 DIAGNOSIS — Z6836 Body mass index (BMI) 36.0-36.9, adult: Secondary | ICD-10-CM

## 2019-05-11 DIAGNOSIS — A419 Sepsis, unspecified organism: Secondary | ICD-10-CM | POA: Diagnosis not present

## 2019-05-11 DIAGNOSIS — L03115 Cellulitis of right lower limb: Secondary | ICD-10-CM

## 2019-05-11 DIAGNOSIS — N183 Chronic kidney disease, stage 3 unspecified: Secondary | ICD-10-CM | POA: Diagnosis present

## 2019-05-11 DIAGNOSIS — Z8249 Family history of ischemic heart disease and other diseases of the circulatory system: Secondary | ICD-10-CM

## 2019-05-11 DIAGNOSIS — Z79899 Other long term (current) drug therapy: Secondary | ICD-10-CM

## 2019-05-11 DIAGNOSIS — J45909 Unspecified asthma, uncomplicated: Secondary | ICD-10-CM | POA: Diagnosis present

## 2019-05-11 DIAGNOSIS — Z833 Family history of diabetes mellitus: Secondary | ICD-10-CM

## 2019-05-11 DIAGNOSIS — M1990 Unspecified osteoarthritis, unspecified site: Secondary | ICD-10-CM | POA: Diagnosis present

## 2019-05-11 DIAGNOSIS — Z7951 Long term (current) use of inhaled steroids: Secondary | ICD-10-CM

## 2019-05-11 DIAGNOSIS — I872 Venous insufficiency (chronic) (peripheral): Secondary | ICD-10-CM | POA: Diagnosis present

## 2019-05-11 DIAGNOSIS — Z86718 Personal history of other venous thrombosis and embolism: Secondary | ICD-10-CM

## 2019-05-11 DIAGNOSIS — Z806 Family history of leukemia: Secondary | ICD-10-CM

## 2019-05-11 DIAGNOSIS — I890 Lymphedema, not elsewhere classified: Secondary | ICD-10-CM

## 2019-05-11 DIAGNOSIS — Z20828 Contact with and (suspected) exposure to other viral communicable diseases: Secondary | ICD-10-CM | POA: Diagnosis present

## 2019-05-11 DIAGNOSIS — E1122 Type 2 diabetes mellitus with diabetic chronic kidney disease: Secondary | ICD-10-CM | POA: Diagnosis present

## 2019-05-11 DIAGNOSIS — E1140 Type 2 diabetes mellitus with diabetic neuropathy, unspecified: Secondary | ICD-10-CM | POA: Diagnosis present

## 2019-05-11 DIAGNOSIS — Z8349 Family history of other endocrine, nutritional and metabolic diseases: Secondary | ICD-10-CM

## 2019-05-11 DIAGNOSIS — Z7982 Long term (current) use of aspirin: Secondary | ICD-10-CM

## 2019-05-11 DIAGNOSIS — R269 Unspecified abnormalities of gait and mobility: Secondary | ICD-10-CM | POA: Diagnosis present

## 2019-05-11 DIAGNOSIS — R509 Fever, unspecified: Secondary | ICD-10-CM

## 2019-05-11 DIAGNOSIS — I5032 Chronic diastolic (congestive) heart failure: Secondary | ICD-10-CM | POA: Diagnosis present

## 2019-05-11 DIAGNOSIS — E1165 Type 2 diabetes mellitus with hyperglycemia: Secondary | ICD-10-CM | POA: Diagnosis present

## 2019-05-11 DIAGNOSIS — Z90710 Acquired absence of both cervix and uterus: Secondary | ICD-10-CM

## 2019-05-11 DIAGNOSIS — Z8261 Family history of arthritis: Secondary | ICD-10-CM

## 2019-05-11 DIAGNOSIS — L03119 Cellulitis of unspecified part of limb: Secondary | ICD-10-CM | POA: Diagnosis present

## 2019-05-11 DIAGNOSIS — H409 Unspecified glaucoma: Secondary | ICD-10-CM | POA: Diagnosis present

## 2019-05-11 DIAGNOSIS — N179 Acute kidney failure, unspecified: Secondary | ICD-10-CM | POA: Diagnosis present

## 2019-05-11 DIAGNOSIS — D649 Anemia, unspecified: Secondary | ICD-10-CM | POA: Diagnosis present

## 2019-05-11 LAB — COMPREHENSIVE METABOLIC PANEL
ALT: 35 U/L (ref 0–44)
AST: 30 U/L (ref 15–41)
Albumin: 3.2 g/dL — ABNORMAL LOW (ref 3.5–5.0)
Alkaline Phosphatase: 103 U/L (ref 38–126)
Anion gap: 13 (ref 5–15)
BUN: 11 mg/dL (ref 8–23)
CO2: 22 mmol/L (ref 22–32)
Calcium: 9.9 mg/dL (ref 8.9–10.3)
Chloride: 98 mmol/L (ref 98–111)
Creatinine, Ser: 1.23 mg/dL — ABNORMAL HIGH (ref 0.44–1.00)
GFR calc Af Amer: 52 mL/min — ABNORMAL LOW (ref >60)
GFR calc non Af Amer: 45 mL/min — ABNORMAL LOW (ref >60)
Glucose, Bld: 230 mg/dL — ABNORMAL HIGH (ref 70–99)
Potassium: 3.9 mmol/L (ref 3.5–5.1)
Sodium: 133 mmol/L — ABNORMAL LOW (ref 135–145)
Total Bilirubin: 0.7 mg/dL (ref 0.3–1.2)
Total Protein: 7 g/dL (ref 6.5–8.1)

## 2019-05-11 LAB — CBC WITH DIFFERENTIAL/PLATELET
Abs Immature Granulocytes: 0.04 10*3/uL (ref 0.00–0.07)
Basophils Absolute: 0 10*3/uL (ref 0.0–0.1)
Basophils Relative: 0 %
Eosinophils Absolute: 0 10*3/uL (ref 0.0–0.5)
Eosinophils Relative: 0 %
HCT: 33.7 % — ABNORMAL LOW (ref 36.0–46.0)
Hemoglobin: 11 g/dL — ABNORMAL LOW (ref 12.0–15.0)
Immature Granulocytes: 0 %
Lymphocytes Relative: 6 %
Lymphs Abs: 0.8 10*3/uL (ref 0.7–4.0)
MCH: 26.5 pg (ref 26.0–34.0)
MCHC: 32.6 g/dL (ref 30.0–36.0)
MCV: 81.2 fL (ref 80.0–100.0)
Monocytes Absolute: 0.4 10*3/uL (ref 0.1–1.0)
Monocytes Relative: 3 %
Neutro Abs: 12.4 10*3/uL — ABNORMAL HIGH (ref 1.7–7.7)
Neutrophils Relative %: 91 %
Platelets: 188 10*3/uL (ref 150–400)
RBC: 4.15 MIL/uL (ref 3.87–5.11)
RDW: 15.5 % (ref 11.5–15.5)
WBC: 13.6 10*3/uL — ABNORMAL HIGH (ref 4.0–10.5)
nRBC: 0 % (ref 0.0–0.2)

## 2019-05-11 LAB — LACTIC ACID, PLASMA
Lactic Acid, Venous: 2.7 mmol/L (ref 0.5–1.9)
Lactic Acid, Venous: 1.9 mmol/L (ref 0.5–1.9)

## 2019-05-11 MED ORDER — ACETAMINOPHEN 325 MG PO TABS
650.0000 mg | ORAL_TABLET | Freq: Once | ORAL | Status: AC
  Administered 2019-05-11: 650 mg via ORAL

## 2019-05-11 MED ORDER — ACETAMINOPHEN 325 MG PO TABS
ORAL_TABLET | ORAL | Status: AC
  Filled 2019-05-11: qty 2

## 2019-05-11 NOTE — ED Triage Notes (Signed)
Pt to ED with c/o right leg swelling and redness.  Onset yesterday.  Pt was sent to ED by Urgent Care

## 2019-05-11 NOTE — ED Triage Notes (Signed)
Pt to ED with c/o right leg swelling and redness.  Also elevated temp.  Pt was sent to ED from Urgent Care.  Pt was given Tylenol at Urgent Care.

## 2019-05-11 NOTE — ED Notes (Signed)
Patient is being discharged from the [facility] and sent to the Emergency Department via POV Per KT, patient is stable but in need of higher level of care due to eval for fever and cellulitis on lower leg/r/o sepsis . Patient is aware and verbalizes understanding of plan of care.  Vitals:   05/11/19 2006  BP: (!) 126/59  Pulse: (!) 118  Resp: 18  Temp: (!) 101.5 F (38.6 C)  SpO2: 100%

## 2019-05-11 NOTE — Discharge Instructions (Signed)
Go to the emergency department for evaluation of your fever and leg redness/swelling.

## 2019-05-11 NOTE — ED Triage Notes (Signed)
Pt here for bilateral leg swelling and redness in right leg; fever noted

## 2019-05-11 NOTE — ED Provider Notes (Signed)
[place]    CSN: [id number] Arrival date & time: 05/11/19  1953      History   Chief Complaint Chief Complaint  Patient presents with  . Leg Swelling    HPI Jessica Yates is a 68 y.o. female.  Patient presents with swelling and redness in RLE x 1 day; she also has a fever.  Past medical history is significant for  DVT , venous insufficiency, CHF, lymphedema, and cellulitis.   She was hospitalized in Nov 2019 for LE cellulitis.    The history is provided by the patient.    Past Medical History:  Diagnosis Date  . Anemia 2002  . Anxiety   . Arthritis   . Asthma   . Blood clot in vein   . Cellulitis   . Cough   . Depression 02/06/2008  . DVT (deep venous thrombosis) ([place]) 2002   left leg  . Glaucoma   . Lymphedema   . Palpitations   . Peripheral venous insufficiency   . Seasonal allergies   . Sickle cell trait ([place])   . Vitamin D deficiency     Patient Active Problem List   Diagnosis Date Noted  . Cellulitis and abscess of left leg 07/22/2018  . Cough 05/07/2016  . Upper airway cough syndrome 05/07/2016  . Sebaceous cyst of labia 04/19/2016  . OAB (overactive bladder) 04/19/2016  . 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 Lymphedema 05/03/2012  . CHF (congestive heart failure) ([place]) 04/16/2011  . SICKLE-CELL TRAIT 08/01/2007  . UNSPECIFIED GLAUCOMA 08/01/2007  . DVT 08/01/2007  . VENOUS INSUFFICIENCY 08/01/2007  . ALLERGIC RHINITIS 08/01/2007  . Extrinsic asthma 08/01/2007    Past Surgical History:  Procedure Laterality Date  . COSMETIC SURGERY  1975   nasal reconstruction  . TOTAL ABDOMINAL HYSTERECTOMY  07/2001    OB History    Gravida  0   Para  0   Term  0   Preterm  0   AB  0   Living  0     SAB  0   TAB  0   Ectopic  0   Multiple  0   Live Births  0            Home Medications    Prior to Admission medications   Medication Sig Start Date End Date Taking? Authorizing  Provider  acetaminophen (TYLENOL) 500 MG tablet Take 1,000 mg by mouth every 6 (six) hours as needed for moderate pain.    [provider]  aspirin 81 MG tablet Take 81 mg by mouth daily.      [provider]  benzonatate (TESSALON) 200 MG capsule Take 1 capsule (200 mg total) by mouth 2 (two) times daily as needed for cough. 03/09/19   Raylene Everts, MD  Bioflavonoid Products (BIOFLEX PO) Take by mouth.    [provider]  Cholecalciferol (VITAMIN D3) 1000 UNITS CAPS Take 2,000 capsules by mouth daily.     [provider]  ferrous sulfate 325 (65 FE) MG tablet Take 325 mg by mouth daily with breakfast.    [provider]  furosemide (LASIX) 40 MG tablet 40mg  twice daily for 5 days, then 40mg  once daily. 07/28/18   Bonnielee Haff, MD  ipratropium (ATROVENT) 0.03 % nasal spray Place 2 sprays into the nose daily. 04/18/18   [provider]  mirabegron ER (MYRBETRIQ) 25 MG TB24 tablet Take 1 tablet (25 mg total) by mouth daily.  04/19/16   Terrance Mass, MD  pantoprazole (PROTONIX) 20 MG tablet Take 1 tablet (20 mg total) by mouth daily. 03/09/19   Raylene Everts, MD  potassium chloride SA (K-DUR,KLOR-CON) 20 MEQ tablet Take 20 mEq by mouth daily.  04/27/16   [provider]  TRAVATAN Z 0.004 % SOLN ophthalmic solution Place 1 drop into both eyes At bedtime. 03/14/11   [provider]  triamcinolone cream (KENALOG) 0.1 % Apply 1 application topically daily as needed (for irritation).     [provider]  TURMERIC PO Take by mouth.    [provider]    Family History Family History  Problem Relation Age of Onset  . Allergies Mother   . Asthma Mother   . Other Mother        enlarged heart  . Arthritis Mother   . Breast cancer Mother   . Diabetes Mother   . Hypertension Mother   . Cancer Mother        breast  . Hyperlipidemia Mother   . Heart disease Maternal Grandmother   . Breast cancer Maternal  Grandmother   . Birth defects Maternal Grandmother        breast  . Cancer Father 74       leukemia  . Allergies Sister   . Arthritis Sister     Social History Social History   Tobacco Use  . Smoking status: Never Smoker  . Smokeless tobacco: Never Used  Substance Use Topics  . Alcohol use: No  . Drug use: No     Allergies   Patient has no known allergies.   Review of Systems Review of Systems  Constitutional: Positive for fever. Negative for chills.  HENT: Negative for ear pain and sore throat.   Eyes: Negative for pain and visual disturbance.  Respiratory: Negative for cough and shortness of breath.   Cardiovascular: Negative for chest pain and palpitations.  Gastrointestinal: Negative for abdominal pain and vomiting.  Genitourinary: Negative for dysuria and hematuria.  Musculoskeletal: Negative for arthralgias and back pain.  Skin: Positive for color change. Negative for rash.  Neurological: Negative for seizures and syncope.  All other systems reviewed and are negative.    Physical Exam Triage Vital Signs ED Triage Vitals  Enc Vitals Group     BP 05/11/19 2006 (!) 126/59     Pulse Rate 05/11/19 2006 (!) 118     Resp 05/11/19 2006 18     Temp 05/11/19 2006 (!) 101.5 F (38.6 C)     Temp Source 05/11/19 2006 Oral     SpO2 05/11/19 2006 100 %     Weight --      Height --      Head Circumference --      Peak Flow --      Pain Score 05/11/19 2007 8     Pain Loc --      Pain Edu? --      Excl. in [place]? --    No data found.  Updated Vital Signs BP (!) 126/59 (BP Location: Right Arm)   Pulse (!) 118   Temp (!) 101.5 F (38.6 C) (Oral)   Resp 18   SpO2 100%   Visual Acuity Right Eye Distance:   Left Eye Distance:   Bilateral Distance:    Right Eye Near:   Left Eye Near:    Bilateral Near:     Physical Exam Vitals signs and nursing note reviewed.  Constitutional:  General: She is not in acute distress.    Appearance: She is  well-developed.  HENT:     Head: Normocephalic and atraumatic.  Eyes:     Conjunctiva/sclera: Conjunctivae normal.  Neck:     Musculoskeletal: Neck supple.  Cardiovascular:     Rate and Rhythm: Normal rate and regular rhythm.     Heart sounds: No murmur.  Pulmonary:     Effort: Pulmonary effort is normal. No respiratory distress.     Breath sounds: Normal breath sounds.  Abdominal:     Palpations: Abdomen is soft.     Tenderness: There is no abdominal tenderness.  Musculoskeletal:        General: Swelling present.  Skin:    General: Skin is warm and dry.     Findings: Erythema present.     Comments: RLE erythematous, edematous, tender.   Neurological:     Mental Status: She is alert.      UC Treatments / Results  Labs (all labs ordered are listed, but only abnormal results are displayed) Labs Reviewed - No data to display  EKG   Radiology No results found.  Procedures Procedures (including critical care time)  Medications Ordered in UC Medications  acetaminophen (TYLENOL) tablet 650 mg (650 mg Oral Given 05/11/19 2009)  acetaminophen (TYLENOL) 325 MG tablet (has no administration in time range)    Initial Impression / Assessment and Plan / UC Course  I have reviewed the triage vital signs and the nursing notes.  Pertinent labs & imaging results that were available during my care of the patient were reviewed by me and considered in my medical decision making (see chart for details).    Right LE swelling.  Fever.  Patient has history of DVT , venous insufficiency, CHF, lymphedema, and cellulitis.   She was hospitalized in Nov 2019 for LE cellulitis.   Sending patient to ED for evaluation.     Final Clinical Impressions(s) / UC Diagnoses   Final diagnoses:  Right leg swelling  Fever, unspecified fever cause     Discharge Instructions     Go to the emergency department for evaluation of your fever and leg redness/swelling.      ED Prescriptions     None     Controlled Substance Prescriptions [place] Controlled Substance Registry consulted? Not Applicable   Sharion Balloon, NP 05/11/19 2016

## 2019-05-12 ENCOUNTER — Observation Stay (HOSPITAL_BASED_OUTPATIENT_CLINIC_OR_DEPARTMENT_OTHER): Payer: Medicare HMO

## 2019-05-12 ENCOUNTER — Encounter (HOSPITAL_COMMUNITY): Payer: Self-pay | Admitting: Internal Medicine

## 2019-05-12 DIAGNOSIS — D649 Anemia, unspecified: Secondary | ICD-10-CM | POA: Diagnosis present

## 2019-05-12 DIAGNOSIS — L03119 Cellulitis of unspecified part of limb: Secondary | ICD-10-CM | POA: Diagnosis present

## 2019-05-12 DIAGNOSIS — L03115 Cellulitis of right lower limb: Secondary | ICD-10-CM | POA: Diagnosis not present

## 2019-05-12 DIAGNOSIS — N183 Chronic kidney disease, stage 3 unspecified: Secondary | ICD-10-CM | POA: Diagnosis present

## 2019-05-12 DIAGNOSIS — N179 Acute kidney failure, unspecified: Secondary | ICD-10-CM | POA: Diagnosis present

## 2019-05-12 DIAGNOSIS — R609 Edema, unspecified: Secondary | ICD-10-CM

## 2019-05-12 LAB — CBC
HCT: 34.3 % — ABNORMAL LOW (ref 36.0–46.0)
Hemoglobin: 11.1 g/dL — ABNORMAL LOW (ref 12.0–15.0)
MCH: 26.3 pg (ref 26.0–34.0)
MCHC: 32.4 g/dL (ref 30.0–36.0)
MCV: 81.3 fL (ref 80.0–100.0)
Platelets: 153 10*3/uL (ref 150–400)
RBC: 4.22 MIL/uL (ref 3.87–5.11)
RDW: 15.6 % — ABNORMAL HIGH (ref 11.5–15.5)
WBC: 11.8 10*3/uL — ABNORMAL HIGH (ref 4.0–10.5)
nRBC: 0 % (ref 0.0–0.2)

## 2019-05-12 LAB — BASIC METABOLIC PANEL WITH GFR
Anion gap: 11 (ref 5–15)
BUN: 13 mg/dL (ref 8–23)
CO2: 23 mmol/L (ref 22–32)
Calcium: 9.5 mg/dL (ref 8.9–10.3)
Chloride: 102 mmol/L (ref 98–111)
Creatinine, Ser: 1.14 mg/dL — ABNORMAL HIGH (ref 0.44–1.00)
GFR calc Af Amer: 57 mL/min — ABNORMAL LOW
GFR calc non Af Amer: 49 mL/min — ABNORMAL LOW
Glucose, Bld: 209 mg/dL — ABNORMAL HIGH (ref 70–99)
Potassium: 3.8 mmol/L (ref 3.5–5.1)
Sodium: 136 mmol/L (ref 135–145)

## 2019-05-12 LAB — HEMOGLOBIN A1C
Hgb A1c MFr Bld: 6.9 % — ABNORMAL HIGH (ref 4.8–5.6)
Mean Plasma Glucose: 151.33 mg/dL

## 2019-05-12 LAB — SARS CORONAVIRUS 2 (TAT 6-24 HRS): SARS Coronavirus 2: NEGATIVE

## 2019-05-12 MED ORDER — ONDANSETRON HCL 4 MG/2ML IJ SOLN: 4.0000 mg | Freq: Four times a day (QID) | INTRAMUSCULAR | Status: DC | PRN

## 2019-05-12 MED ORDER — PANTOPRAZOLE SODIUM 40 MG PO TBEC
40.0000 mg | DELAYED_RELEASE_TABLET | Freq: Every day | ORAL | Status: DC
  Administered 2019-05-12 – 2019-05-20 (×9): 40 mg via ORAL
  Filled 2019-05-12 (×9): qty 1

## 2019-05-12 MED ORDER — POTASSIUM CHLORIDE CRYS ER 20 MEQ PO TBCR
20.0000 meq | EXTENDED_RELEASE_TABLET | Freq: Every day | ORAL | Status: DC
  Administered 2019-05-12 – 2019-05-20 (×9): 20 meq via ORAL
  Filled 2019-05-12 (×9): qty 1

## 2019-05-12 MED ORDER — ASPIRIN EC 81 MG PO TBEC
81.0000 mg | DELAYED_RELEASE_TABLET | Freq: Every day | ORAL | Status: DC
  Administered 2019-05-12 – 2019-05-20 (×9): 81 mg via ORAL
  Filled 2019-05-12 (×9): qty 1

## 2019-05-12 MED ORDER — VANCOMYCIN HCL IN DEXTROSE 1-5 GM/200ML-% IV SOLN
1000.0000 mg | INTRAVENOUS | Status: DC
  Administered 2019-05-13: 11:00:00 1000 mg via INTRAVENOUS
  Filled 2019-05-12: qty 200

## 2019-05-12 MED ORDER — VANCOMYCIN HCL IN DEXTROSE 1-5 GM/200ML-% IV SOLN
1000.0000 mg | Freq: Two times a day (BID) | INTRAVENOUS | Status: DC
  Administered 2019-05-12: 1000 mg via INTRAVENOUS
  Filled 2019-05-12 (×2): qty 200

## 2019-05-12 MED ORDER — ONDANSETRON HCL 4 MG PO TABS: 4.0000 mg | ORAL_TABLET | Freq: Four times a day (QID) | ORAL | Status: DC | PRN

## 2019-05-12 MED ORDER — FUROSEMIDE 40 MG PO TABS
40.0000 mg | ORAL_TABLET | Freq: Every day | ORAL | Status: DC
  Administered 2019-05-12 – 2019-05-16 (×5): 40 mg via ORAL
  Filled 2019-05-12 (×5): qty 1

## 2019-05-12 MED ORDER — ACETAMINOPHEN 650 MG RE SUPP: 650.0000 mg | Freq: Four times a day (QID) | RECTAL | Status: DC | PRN

## 2019-05-12 MED ORDER — VANCOMYCIN HCL 10 G IV SOLR
2000.0000 mg | Freq: Once | INTRAVENOUS | Status: AC
  Administered 2019-05-12: 2000 mg via INTRAVENOUS
  Filled 2019-05-12: qty 2000

## 2019-05-12 MED ORDER — SODIUM CHLORIDE 0.9 % IV BOLUS (SEPSIS)
1000.0000 mL | Freq: Once | INTRAVENOUS | Status: AC
  Administered 2019-05-12: 1000 mL via INTRAVENOUS

## 2019-05-12 MED ORDER — LATANOPROST 0.005 % OP SOLN
1.0000 [drp] | Freq: Every day | OPHTHALMIC | Status: DC
  Administered 2019-05-12 – 2019-05-19 (×9): 1 [drp] via OPHTHALMIC
  Filled 2019-05-12: qty 2.5

## 2019-05-12 MED ORDER — ENOXAPARIN SODIUM 40 MG/0.4ML ~~LOC~~ SOLN
40.0000 mg | Freq: Every day | SUBCUTANEOUS | Status: DC
  Administered 2019-05-12: 10:00:00 40 mg via SUBCUTANEOUS
  Filled 2019-05-12: qty 0.4

## 2019-05-12 MED ORDER — PIPERACILLIN-TAZOBACTAM 3.375 G IVPB
3.3750 g | Freq: Three times a day (TID) | INTRAVENOUS | Status: DC
  Administered 2019-05-12 – 2019-05-13 (×4): 3.375 g via INTRAVENOUS
  Filled 2019-05-12 (×4): qty 50

## 2019-05-12 MED ORDER — ACETAMINOPHEN 325 MG PO TABS
650.0000 mg | ORAL_TABLET | Freq: Four times a day (QID) | ORAL | Status: DC | PRN
  Administered 2019-05-12 – 2019-05-20 (×13): 650 mg via ORAL
  Filled 2019-05-12 (×14): qty 2

## 2019-05-12 MED ORDER — ENOXAPARIN SODIUM 60 MG/0.6ML ~~LOC~~ SOLN
50.0000 mg | Freq: Every day | SUBCUTANEOUS | Status: DC
  Administered 2019-05-13 – 2019-05-19 (×7): 50 mg via SUBCUTANEOUS
  Filled 2019-05-12 (×7): qty 0.6

## 2019-05-12 NOTE — Progress Notes (Signed)
Pharmacy Antibiotic Note  Jessica Yates is a 68 y.o. female admitted on 05/11/2019 with RLE cellulitis. Pharmacy has been consulted for Vancomycin and Zosyn dosing.     Plan: Adjust Vancomycin to 1g IV q24h (goal AUC 400-550).  Continue Zosyn 3.375g IV q8h (each dose infused over 4 hours).  Monitor renal function, culture results as available, clinical course.    Height: 5\' 7"  (170.2 cm) Weight: 230 lb 2.6 oz (104.4 kg) IBW/kg (Calculated) : 61.6  Temp (24hrs), Avg:100.3 F (37.9 C), Min:99.1 F (37.3 C), Max:102.7 F (39.3 C)  Recent Labs  Lab 05/11/19 2114 05/11/19 2115 05/11/19 2257 05/12/19 0421  WBC 13.6*  --   --  11.8*  CREATININE 1.23*  --   --  1.14*  LATICACIDVEN  --  2.7* 1.9  --     Estimated Creatinine Clearance: 58.7 mL/min (A) (by C-G formula based on SCr of 1.14 mg/dL (H)).    No Known Allergies    Thank you for allowing pharmacy to be a part of this patient's care.    Lindell Spar, PharmD, BCPS Clinical Pharmacist  05/12/2019 2:49 PM

## 2019-05-12 NOTE — Progress Notes (Signed)
VASCULAR LAB PRELIMINARY  PRELIMINARY  PRELIMINARY  PRELIMINARY  Bilateral lower extremity venous duplex completed.    Preliminary report:  See CV proc for preliminary results.  Amberia Bayless, RVT 05/12/2019, 4:15 PM

## 2019-05-12 NOTE — Progress Notes (Signed)
Pharmacy Antibiotic Note  Jessica Yates is a 68 y.o. female admitted on 05/11/2019 with LLE swelling/cellulitis.  Pharmacy has been consulted for Vancomycin and Zosyn  Dosing.  Vancomycin 2 g IV given in ED at  0100  Plan: Vancomycin 1 g IV q12h Zosyn 3.375 g IV q8h   Height: 5\' 7"  (170.2 cm) Weight: 230 lb 2.6 oz (104.4 kg) IBW/kg (Calculated) : 61.6  Temp (24hrs), Avg:100.7 F (38.2 C), Min:99.1 F (37.3 C), Max:102.7 F (39.3 C)  Recent Labs  Lab 05/11/19 2114 05/11/19 2115 05/11/19 2257  WBC 13.6*  --   --   CREATININE 1.23*  --   --   LATICACIDVEN  --  2.7* 1.9    Estimated Creatinine Clearance: 54.4 mL/min (A) (by C-G formula based on SCr of 1.23 mg/dL (H)).    No Known Allergies   Caryl Pina 05/12/2019 3:23 AM

## 2019-05-12 NOTE — ED Provider Notes (Signed)
[facility] EMERGENCY DEPARTMENT Provider Note   CSN: [id number] Arrival date & time: 05/11/19  2028     History   Chief Complaint Chief Complaint  Patient presents with  . Leg Swelling    HPI VOULA VILAR is a 68 y.o. female.     The history is provided by the patient.  Leg Pain Location:  Leg Time since incident:  1 day Leg location:  L lower leg Pain details:    Quality:  Aching   Severity:  Moderate   Onset quality:  Gradual   Duration:  1 day   Timing:  Constant   Progression:  Worsening Chronicity:  New Relieved by:  Nothing Worsened by:  Bearing weight Associated symptoms: fever   Patient with a history of previous DVT, chronic lymphedema presents with right leg pain and swelling for the past day.  She has had a fever.  She reports the swelling and redness is worsening.  No vomiting or diarrhea.  No chest pain or shortness of breath  Past Medical History:  Diagnosis Date  . Anemia 2002  . Anxiety   . Arthritis   . Asthma   . Blood clot in vein   . Cellulitis   . Cough   . Depression 02/06/2008  . DVT (deep venous thrombosis) ([place]) 2002   left leg  . Glaucoma   . Lymphedema   . Palpitations   . Peripheral venous insufficiency   . Seasonal allergies   . Sickle cell trait ([place])   . Vitamin D deficiency     Patient Active Problem List   Diagnosis Date Noted  . Cellulitis and abscess of left leg 07/22/2018  . Cough 05/07/2016  . Upper airway cough syndrome 05/07/2016  . Sebaceous cyst of labia 04/19/2016  . OAB (overactive bladder) 04/19/2016  . 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 Lymphedema 05/03/2012  . CHF (congestive heart failure) ([place]) 04/16/2011  . SICKLE-CELL TRAIT 08/01/2007  . UNSPECIFIED GLAUCOMA 08/01/2007  . DVT 08/01/2007  . VENOUS INSUFFICIENCY 08/01/2007  . ALLERGIC RHINITIS 08/01/2007  . Extrinsic asthma 08/01/2007    Past Surgical History:  Procedure Laterality Date   . COSMETIC SURGERY  1975   nasal reconstruction  . TOTAL ABDOMINAL HYSTERECTOMY  07/2001     OB History    Gravida  0   Para  0   Term  0   Preterm  0   AB  0   Living  0     SAB  0   TAB  0   Ectopic  0   Multiple  0   Live Births  0            Home Medications    Prior to Admission medications   Medication Sig Start Date End Date Taking? Authorizing Provider  acetaminophen (TYLENOL) 500 MG tablet Take 1,000 mg by mouth every 6 (six) hours as needed for moderate pain.    [provider]  aspirin 81 MG tablet Take 81 mg by mouth daily.      [provider]  benzonatate (TESSALON) 200 MG capsule Take 1 capsule (200 mg total) by mouth 2 (two) times daily as needed for cough. 03/09/19   Raylene Everts, MD  Bioflavonoid Products (BIOFLEX PO) Take by mouth.    [provider]  Cholecalciferol (VITAMIN D3) 1000 UNITS CAPS Take 2,000 capsules by mouth daily.     [provider]  ferrous sulfate 325 (  65 FE) MG tablet Take 325 mg by mouth daily with breakfast.    [provider]  furosemide (LASIX) 40 MG tablet 40mg  twice daily for 5 days, then 40mg  once daily. 07/28/18   Bonnielee Haff, MD  ipratropium (ATROVENT) 0.03 % nasal spray Place 2 sprays into the nose daily. 04/18/18   [provider]  mirabegron ER (MYRBETRIQ) 25 MG TB24 tablet Take 1 tablet (25 mg total) by mouth daily. 04/19/16   Terrance Mass, MD  pantoprazole (PROTONIX) 20 MG tablet Take 1 tablet (20 mg total) by mouth daily. 03/09/19   Raylene Everts, MD  potassium chloride SA (K-DUR,KLOR-CON) 20 MEQ tablet Take 20 mEq by mouth daily.  04/27/16   [provider]  TRAVATAN Z 0.004 % SOLN ophthalmic solution Place 1 drop into both eyes At bedtime. 03/14/11   [provider]  triamcinolone cream (KENALOG) 0.1 % Apply 1 application topically daily as needed (for irritation).     [provider]  TURMERIC PO Take by mouth.     [provider]    Family History Family History  Problem Relation Age of Onset  . Allergies Mother   . Asthma Mother   . Other Mother        enlarged heart  . Arthritis Mother   . Breast cancer Mother   . Diabetes Mother   . Hypertension Mother   . Cancer Mother        breast  . Hyperlipidemia Mother   . Heart disease Maternal Grandmother   . Breast cancer Maternal Grandmother   . Birth defects Maternal Grandmother        breast  . Cancer Father 61       leukemia  . Allergies Sister   . Arthritis Sister     Social History Social History   Tobacco Use  . Smoking status: Never Smoker  . Smokeless tobacco: Never Used  Substance Use Topics  . Alcohol use: No  . Drug use: No     Allergies   Patient has no known allergies.   Review of Systems Review of Systems  Constitutional: Positive for fever.  Gastrointestinal: Negative for diarrhea and vomiting.  Musculoskeletal: Positive for arthralgias and myalgias.  All other systems reviewed and are negative.    Physical Exam Updated Vital Signs BP 114/73 (BP Location: Right Arm) Comment: Simultaneous filing. User may not have seen previous data.  Pulse 94   Temp 99.1 F (37.3 C) (Oral)   Resp 18   Ht 1.702 m (5\' 7" )   Wt 104.3 kg   SpO2 100%   BMI 36.02 kg/m   Physical Exam CONSTITUTIONAL: Well developed/well nourished, no distress HEAD: Normocephalic/atraumatic EYES: EOMI ENMT: Mask in place NECK: supple no meningeal signs SPINE/BACK:entire spine nontender CV: S1/S2 noted, no murmurs/rubs/gallops noted LUNGS: Lungs are clear to auscultation bilaterally, no apparent distress ABDOMEN: soft, nontender NEURO: Pt is awake/alert/appropriate, moves all extremitiesx4.  No facial droop.   EXTREMITIES: pulses normal/equal, full ROM Patient with chronic lymphedema bilaterally.  Right leg is erythematous and tender.  No crepitus.  See photo SKIN: warm, color normal PSYCH: no abnormalities of mood  noted, alert and oriented to situation    Patient gave verbal permission to utilize photo for medical documentation only The image was not stored on any personal device ED Treatments / Results  Labs (all labs ordered are listed, but only abnormal results are displayed) Labs Reviewed  CBC WITH DIFFERENTIAL/PLATELET - Abnormal; Notable for the  following components:      Result Value   WBC 13.6 (*)    Hemoglobin 11.0 (*)    HCT 33.7 (*)    Neutro Abs 12.4 (*)    All other components within normal limits  COMPREHENSIVE METABOLIC PANEL - Abnormal; Notable for the following components:   Sodium 133 (*)    Glucose, Bld 230 (*)    Creatinine, Ser 1.23 (*)    Albumin 3.2 (*)    GFR calc non Af Amer 45 (*)    GFR calc Af Amer 52 (*)    All other components within normal limits  LACTIC ACID, PLASMA - Abnormal; Notable for the following components:   Lactic Acid, Venous 2.7 (*)    All other components within normal limits  SARS CORONAVIRUS 2 (TAT 6-12 HRS)  LACTIC ACID, PLASMA    EKG None  Radiology No results found.  Procedures Procedures Medications Ordered in ED Medications  sodium chloride 0.9 % bolus 1,000 mL (has no administration in time range)  vancomycin (VANCOCIN) 2,000 mg in sodium chloride 0.9 % 500 mL IVPB (has no administration in time range)     Initial Impression / Assessment and Plan / ED Course  I have reviewed the triage vital signs and the nursing notes.  Pertinent labs   results that were available during my care of the patient were reviewed by me and considered in my medical decision making (see chart for details).        12:45 AM Patient with chronic lymphedema here with superimposed cellulitis to right lower extremity.  Due to extension of erythema and chronic comorbidities, will admit for IV antibiotics 12:54 AM Discussed with Dr. Hal Hope for admission.  IV vancomycin has been ordered Final Clinical Impressions(s) / ED Diagnoses   Final  diagnoses:  Cellulitis of right lower extremity    ED Discharge Orders    None       Ripley Fraise, MD 05/12/19 [phone]

## 2019-05-12 NOTE — Plan of Care (Signed)
?  Problem: Clinical Measurements: ?Goal: Ability to avoid or minimize complications of infection will improve ?Outcome: Progressing ?  ?Problem: Skin Integrity: ?Goal: Skin integrity will improve ?Outcome: Progressing ?  ?

## 2019-05-12 NOTE — Progress Notes (Addendum)
  Patient admitted after midnight but care started before midnight.  In AM hope to narrow abx to ancef IV.  If continues to worsen, may need further imaging of leg.  Patient reported no DVT when duplex done Still febrile despite IV Abx and tylenol  Media Information    Document Information  Photos    05/12/2019 12:09  Attached To:  Hospital Encounter on 05/11/19  Source Information  Geradine Girt, DO  [facility]

## 2019-05-12 NOTE — H&P (Addendum)
History and Physical    Jessica Yates [id number] DOB: 02/19/1951 DOA: 05/11/2019  PCP: Sonia Side., FNP  Patient coming from: Home.  Chief Complaint: Right lower extremity erythema and pain.  HPI: Jessica Yates is a 68 y.o. female with history of chronic lymphedema, diastolic dysfunction previous history of DVT of the left lower extremity anemia presents to the ER because of ongoing right lower extremity pain and swelling with redness last 2 days.  Denies any trauma or insect bite.  Had some subjective fever fever chills.  Denies any chest pain shortness of breath productive cough nausea vomiting or diarrhea.  ED Course: In the ER patient was febrile with temperature 102 F.  On exam patient has erythema of the right lower extremity extending from the ankle to the right below knee.  Patient's labs show leukocytosis of 13.6 lactate was 2.7 initially.  Patient also has hyperglycemia with glucose of 230.  Patient was started on empiric antibiotics for cellulitis admitted for further management.  COVID-19 test is pending.  Review of Systems: As per HPI, rest all negative.   Past Medical History:  Diagnosis Date  . Anemia 2002  . Anxiety   . Arthritis   . Asthma   . Blood clot in vein   . Cellulitis   . Cough   . Depression 02/06/2008  . DVT (deep venous thrombosis) ([place]) 2002   left leg  . Glaucoma   . Lymphedema   . Palpitations   . Peripheral venous insufficiency   . Seasonal allergies   . Sickle cell trait ([place])   . Vitamin D deficiency     Past Surgical History:  Procedure Laterality Date  . COSMETIC SURGERY  1975   nasal reconstruction  . TOTAL ABDOMINAL HYSTERECTOMY  07/2001     reports that she has never smoked. She has never used smokeless tobacco. She reports that she does not drink alcohol or use drugs.  No Known Allergies  Family History  Problem Relation Age of Onset  . Allergies Mother   . Asthma Mother   . Other Mother        enlarged  heart  . Arthritis Mother   . Breast cancer Mother   . Diabetes Mother   . Hypertension Mother   . Cancer Mother        breast  . Hyperlipidemia Mother   . Heart disease Maternal Grandmother   . Breast cancer Maternal Grandmother   . Birth defects Maternal Grandmother        breast  . Cancer Father 6       leukemia  . Allergies Sister   . Arthritis Sister     Prior to Admission medications   Medication Sig Start Date End Date Taking? Authorizing Provider  acetaminophen (TYLENOL) 500 MG tablet Take 1,000 mg by mouth every 6 (six) hours as needed for moderate pain.   Yes [provider]  aspirin 81 MG tablet Take 81 mg by mouth daily.     Yes [provider]  Cholecalciferol (VITAMIN D3) 1000 UNITS CAPS Take 2,000 capsules by mouth daily.    Yes [provider]  ferrous sulfate 325 (65 FE) MG tablet Take 325 mg by mouth daily with breakfast.   Yes [provider]  furosemide (LASIX) 40 MG tablet 40mg  twice daily for 5 days, then 40mg  once daily. Patient taking differently: Take 40 mg by mouth daily.  07/28/18  Yes Bonnielee Haff, MD  ipratropium (ATROVENT)  0.03 % nasal spray Place 2 sprays into the nose daily as needed for rhinitis.  04/18/18  Yes [provider]  mirabegron ER (MYRBETRIQ) 25 MG TB24 tablet Take 1 tablet (25 mg total) by mouth daily. Patient taking differently: Take 25 mg by mouth daily as needed (overactive bladder).  04/19/16  Yes Terrance Mass, MD  omeprazole (PRILOSEC) 40 MG capsule Take 40 mg by mouth daily. 05/01/19  Yes [provider]  potassium chloride SA (K-DUR,KLOR-CON) 20 MEQ tablet Take 20 mEq by mouth daily.  04/27/16  Yes [provider]  TRAVATAN Z 0.004 % SOLN ophthalmic solution Place 1 drop into both eyes At bedtime. 03/14/11  Yes [provider]  triamcinolone cream (KENALOG) 0.1 % Apply 1 application topically daily as needed (for irritation).    Yes [provider]   benzonatate (TESSALON) 200 MG capsule Take 1 capsule (200 mg total) by mouth 2 (two) times daily as needed for cough. Patient not taking: Reported on 05/12/2019 03/09/19   Raylene Everts, MD  pantoprazole (PROTONIX) 20 MG tablet Take 1 tablet (20 mg total) by mouth daily. Patient not taking: Reported on 05/12/2019 03/09/19   Raylene Everts, MD    Physical Exam: Constitutional: Moderately built and nourished. Vitals:   05/12/19 0030 05/12/19 0100 05/12/19 0130 05/12/19 0224  BP: 111/77 111/81 99/64 118/75  Pulse: 79 (!) 105 (!) 101 (!) 103  Resp:    18  Temp:    99.4 F (37.4 C)  TempSrc:    Oral  SpO2: 94% 96% 98% 98%  Weight:    104.4 kg  Height:    5\' 7"  (1.702 m)   Eyes: Anicteric no pallor. ENMT: No discharge from the ears eyes nose and mouth. Neck: No mass felt.  No neck rigidity. Respiratory: No rhonchi or crepitations. Cardiovascular: S1-S2 heard. Abdomen: Soft nontender bowel sounds present. Musculoskeletal: Edema of the right lower extremity with erythema. Skin: Erythema extending from the right ankle to the right below knee. Neurologic: Alert awake oriented to time place and person.  Moves all extremities. Psychiatric: Appears normal.   Labs on Admission: I have personally reviewed following labs and imaging studies  CBC: Recent Labs  Lab 05/11/19 2114  WBC 13.6*  NEUTROABS 12.4*  HGB 11.0*  HCT 33.7*  MCV 81.2  PLT 0000000   Basic Metabolic Panel: Recent Labs  Lab 05/11/19 2114  NA 133*  K 3.9  CL 98  CO2 22  GLUCOSE 230*  BUN 11  CREATININE 1.23*  CALCIUM 9.9   GFR: Estimated Creatinine Clearance: 54.4 mL/min (A) (by C-G formula based on SCr of 1.23 mg/dL (H)). Liver Function Tests: Recent Labs  Lab 05/11/19 2114  AST 30  ALT 35  ALKPHOS 103  BILITOT 0.7  PROT 7.0  ALBUMIN 3.2*   No results for input(s): LIPASE, AMYLASE in the last 168 hours. No results for input(s): AMMONIA in the last 168 hours. Coagulation Profile: No results  for input(s): INR, PROTIME in the last 168 hours. Cardiac Enzymes: No results for input(s): CKTOTAL, CKMB, CKMBINDEX, TROPONINI in the last 168 hours. BNP (last 3 results) No results for input(s): PROBNP in the last 8760 hours. HbA1C: No results for input(s): HGBA1C in the last 72 hours. CBG: No results for input(s): GLUCAP in the last 168 hours. Lipid Profile: No results for input(s): CHOL, HDL, LDLCALC, TRIG, CHOLHDL, LDLDIRECT in the last 72 hours. Thyroid Function Tests: No results for input(s): TSH, T4TOTAL, FREET4, T3FREE, THYROIDAB in  the last 72 hours. Anemia Panel: No results for input(s): VITAMINB12, FOLATE, FERRITIN, TIBC, IRON, RETICCTPCT in the last 72 hours. Urine analysis: No results found for: COLORURINE, APPEARANCEUR, LABSPEC, PHURINE, GLUCOSEU, HGBUR, BILIRUBINUR, KETONESUR, PROTEINUR, UROBILINOGEN, NITRITE, LEUKOCYTESUR Sepsis Labs: @LABRCNTIP (procalcitonin:4,lacticidven:4) )No results found for this or any previous visit (from the past 240 hour(s)).   Radiological Exams on Admission: No results found.    Assessment/Plan Principal Problem:   Cellulitis of right lower extremity Active Problems:   Lymphedema   CKD (chronic kidney disease) stage 3, GFR 30-59 ml/min (HCC)   Normochromic normocytic anemia   Cellulitis, leg    1. Cellulitis of the right lower extremity with history of lymphedema -will keep patient on vancomycin and Zosyn.  Dopplers of the lower extremity has been ordered since patient has had previous history of DVT. 2. Diastolic dysfunction per 2D echo done in November 2019 which showed EF of 55 to 60% with grade 1 diastolic dysfunction but appears compensated.  On Lasix.  Presently lactate has improved. 3. Normocytic normochromic anemia appears to be chronic follow CBC. 4. Chronic kidney disease stage III creatinine appears to be at baseline. 5. Hyperglycemia -check hemoglobin A1c.  COVID-19 test is pending.   DVT prophylaxis: Lovenox.  Code Status: Full code. Family Communication: Patient's family at the bedside. Disposition Plan: Home. Consults called: None. Admission status: Observation.   Rise Patience MD [facility] Pager [phone].  If 7PM-7AM, please contact night-coverage [email or web address] Password [email or web address]  05/12/2019, 3:20 AM

## 2019-05-13 DIAGNOSIS — K219 Gastro-esophageal reflux disease without esophagitis: Secondary | ICD-10-CM | POA: Diagnosis present

## 2019-05-13 DIAGNOSIS — Z825 Family history of asthma and other chronic lower respiratory diseases: Secondary | ICD-10-CM | POA: Diagnosis not present

## 2019-05-13 DIAGNOSIS — F419 Anxiety disorder, unspecified: Secondary | ICD-10-CM | POA: Diagnosis present

## 2019-05-13 DIAGNOSIS — Z8349 Family history of other endocrine, nutritional and metabolic diseases: Secondary | ICD-10-CM | POA: Diagnosis not present

## 2019-05-13 DIAGNOSIS — E1122 Type 2 diabetes mellitus with diabetic chronic kidney disease: Secondary | ICD-10-CM | POA: Diagnosis present

## 2019-05-13 DIAGNOSIS — A419 Sepsis, unspecified organism: Secondary | ICD-10-CM | POA: Diagnosis present

## 2019-05-13 DIAGNOSIS — N183 Chronic kidney disease, stage 3 (moderate): Secondary | ICD-10-CM | POA: Diagnosis present

## 2019-05-13 DIAGNOSIS — Z8261 Family history of arthritis: Secondary | ICD-10-CM | POA: Diagnosis not present

## 2019-05-13 DIAGNOSIS — E1140 Type 2 diabetes mellitus with diabetic neuropathy, unspecified: Secondary | ICD-10-CM | POA: Diagnosis present

## 2019-05-13 DIAGNOSIS — H409 Unspecified glaucoma: Secondary | ICD-10-CM | POA: Diagnosis present

## 2019-05-13 DIAGNOSIS — M1990 Unspecified osteoarthritis, unspecified site: Secondary | ICD-10-CM | POA: Diagnosis present

## 2019-05-13 DIAGNOSIS — I5032 Chronic diastolic (congestive) heart failure: Secondary | ICD-10-CM | POA: Diagnosis present

## 2019-05-13 DIAGNOSIS — Z803 Family history of malignant neoplasm of breast: Secondary | ICD-10-CM | POA: Diagnosis not present

## 2019-05-13 DIAGNOSIS — Z20828 Contact with and (suspected) exposure to other viral communicable diseases: Secondary | ICD-10-CM | POA: Diagnosis present

## 2019-05-13 DIAGNOSIS — L03115 Cellulitis of right lower limb: Secondary | ICD-10-CM | POA: Diagnosis present

## 2019-05-13 DIAGNOSIS — R269 Unspecified abnormalities of gait and mobility: Secondary | ICD-10-CM | POA: Diagnosis present

## 2019-05-13 DIAGNOSIS — L0390 Cellulitis, unspecified: Secondary | ICD-10-CM | POA: Diagnosis present

## 2019-05-13 DIAGNOSIS — I878 Other specified disorders of veins: Secondary | ICD-10-CM | POA: Diagnosis present

## 2019-05-13 DIAGNOSIS — Z90710 Acquired absence of both cervix and uterus: Secondary | ICD-10-CM | POA: Diagnosis not present

## 2019-05-13 DIAGNOSIS — D573 Sickle-cell trait: Secondary | ICD-10-CM | POA: Diagnosis present

## 2019-05-13 DIAGNOSIS — Z86718 Personal history of other venous thrombosis and embolism: Secondary | ICD-10-CM | POA: Diagnosis not present

## 2019-05-13 DIAGNOSIS — E1165 Type 2 diabetes mellitus with hyperglycemia: Secondary | ICD-10-CM | POA: Diagnosis present

## 2019-05-13 DIAGNOSIS — I872 Venous insufficiency (chronic) (peripheral): Secondary | ICD-10-CM | POA: Diagnosis present

## 2019-05-13 DIAGNOSIS — R5381 Other malaise: Secondary | ICD-10-CM | POA: Diagnosis present

## 2019-05-13 DIAGNOSIS — I890 Lymphedema, not elsewhere classified: Secondary | ICD-10-CM | POA: Diagnosis present

## 2019-05-13 LAB — BASIC METABOLIC PANEL
Anion gap: 9 (ref 5–15)
BUN: 8 mg/dL (ref 8–23)
CO2: 23 mmol/L (ref 22–32)
Calcium: 9.1 mg/dL (ref 8.9–10.3)
Chloride: 103 mmol/L (ref 98–111)
Creatinine, Ser: 1 mg/dL (ref 0.44–1.00)
GFR calc Af Amer: >60 mL/min (ref >60)
GFR calc non Af Amer: 58 mL/min — ABNORMAL LOW (ref >60)
Glucose, Bld: 256 mg/dL — ABNORMAL HIGH (ref 70–99)
Potassium: 3.9 mmol/L (ref 3.5–5.1)
Sodium: 135 mmol/L (ref 135–145)

## 2019-05-13 LAB — CBC
HCT: 30 % — ABNORMAL LOW (ref 36.0–46.0)
Hemoglobin: 9.7 g/dL — ABNORMAL LOW (ref 12.0–15.0)
MCH: 26.3 pg (ref 26.0–34.0)
MCHC: 32.3 g/dL (ref 30.0–36.0)
MCV: 81.3 fL (ref 80.0–100.0)
Platelets: 158 10*3/uL (ref 150–400)
RBC: 3.69 MIL/uL — ABNORMAL LOW (ref 3.87–5.11)
RDW: 15.5 % (ref 11.5–15.5)
WBC: 11.6 10*3/uL — ABNORMAL HIGH (ref 4.0–10.5)
nRBC: 0 % (ref 0.0–0.2)

## 2019-05-13 MED ORDER — CEFAZOLIN SODIUM-DEXTROSE 2-4 GM/100ML-% IV SOLN
2.0000 g | Freq: Three times a day (TID) | INTRAVENOUS | Status: DC
  Administered 2019-05-13 – 2019-05-19 (×18): 2 g via INTRAVENOUS
  Filled 2019-05-13 (×18): qty 100

## 2019-05-13 MED ORDER — OXYCODONE HCL 5 MG PO TABS: 2.5000 mg | ORAL_TABLET | Freq: Four times a day (QID) | ORAL | Status: DC | PRN

## 2019-05-13 MED ORDER — CAMPHOR-MENTHOL 0.5-0.5 % EX LOTN
TOPICAL_LOTION | CUTANEOUS | Status: DC | PRN
  Administered 2019-05-14: 17:00:00 via TOPICAL
  Filled 2019-05-13 (×2): qty 222

## 2019-05-13 NOTE — Progress Notes (Signed)
Progress Note    Jessica Yates  [id number] DOB: 1950/11/11  DOA: 05/11/2019 PCP: Sonia Side., FNP    Brief Narrative:    Medical records reviewed and are as summarized below:  Jessica Yates is an 68 y.o. female with history of chronic lymphedema, diastolic dysfunction previous history of DVT of the left lower extremity anemia presents to the ER because of ongoing right lower extremity pain and swelling with redness last 2 days.  Denies any trauma or insect bite.  Had some subjective fever fever chills.  Denies any chest pain shortness of breath productive cough nausea vomiting or diarrhea.  Assessment/Plan:   Principal Problem:   Cellulitis of right lower extremity Active Problems:   Lymphedema   CKD (chronic kidney disease) stage 3, GFR 30-59 ml/min (HCC)   Normochromic normocytic anemia   Cellulitis, leg   Cellulitis   Cellulitis of the right lower extremity with history of lymphedema  -narrow to ancef from vanc/zosyn -add PRN pain medications Dopplers of the lower extremity negative for DVT cellulitis slow to improve, patient still with fever but it has lessened -blood cultures were not done in the ER-- if patient were to spike temperature, will get blood cultures  Lymphedema -compression stockings when her cellulitis resolves ? [name]-- defer to PCP  Chronic Diastolic dysfunction  -EF of 55 to 60% with grade 1 diastolic dysfunction but appears compensated.  On Lasix.   Normocytic normochromic anemia  -appears to be chronic-- defer to PCP  Hyperglycemia - hemoglobin A1c: 6.9 -can consider metformin at d/c- will discuss SSI with patient to start -will also get nutrition consult in AM for education  Morbid obesity Body mass index is 36.05 kg/m.   Family Communication/Anticipated D/C date and plan/Code Status   DVT prophylaxis: Lovenox ordered. Code Status: Full Code.  Family Communication:  Disposition Plan: home 24-48  hours   Medical Consultants:    None.     Subjective:   Still having pain in her leg with movement especially Multiple questions answered  Objective:    Vitals:   05/12/19 1917 05/13/19 0008 05/13/19 0427 05/13/19 0744  BP: 121/70 117/88 117/70 110/65  Pulse: 91 97 97 93  Resp: 19 18 18 18   Temp: 98.9 F (37.2 C) 99.6 F (37.6 C) 99.5 F (37.5 C) 99.9 F (37.7 C)  TempSrc: Oral Oral Oral Oral  SpO2: 99% 99% 95% 95%  Weight:      Height:        Intake/Output Summary (Last 24 hours) at 05/13/2019 1254 Last data filed at 05/13/2019 1127 Gross per 24 hour  Intake 720 ml  Output 4400 ml  Net -3680 ml   Filed Weights   05/11/19 2054 05/12/19 0224  Weight: 104.3 kg 104.4 kg    Exam: In bed, NAD Morbidly obese Left leg with improved edema Right leg with redness but not as much swelling/tightness, not above knee anymore either  Data Reviewed:   I have personally reviewed following labs and imaging studies:  Labs: Labs show the following:   Basic Metabolic Panel: Recent Labs  Lab 05/11/19 2114 05/12/19 0421 05/13/19 0344  NA 133* 136 135  K 3.9 3.8 3.9  CL 98 102 103  CO2 22 23 23   GLUCOSE 230* 209* 256*  BUN 11 13 8   CREATININE 1.23* 1.14* 1.00  CALCIUM 9.9 9.5 9.1   GFR Estimated Creatinine Clearance: 66.9 mL/min (by C-G formula based on SCr of 1 mg/dL). Liver Function  Tests: Recent Labs  Lab 05/11/19 2114  AST 30  ALT 35  ALKPHOS 103  BILITOT 0.7  PROT 7.0  ALBUMIN 3.2*   No results for input(s): LIPASE, AMYLASE in the last 168 hours. No results for input(s): AMMONIA in the last 168 hours. Coagulation profile No results for input(s): INR, PROTIME in the last 168 hours.  CBC: Recent Labs  Lab 05/11/19 2114 05/12/19 0421 05/13/19 0344  WBC 13.6* 11.8* 11.6*  NEUTROABS 12.4*  --   --   HGB 11.0* 11.1* 9.7*  HCT 33.7* 34.3* 30.0*  MCV 81.2 81.3 81.3  PLT 188 153 158   Cardiac Enzymes: No results for input(s): CKTOTAL, CKMB,  CKMBINDEX, TROPONINI in the last 168 hours. BNP (last 3 results) No results for input(s): PROBNP in the last 8760 hours. CBG: No results for input(s): GLUCAP in the last 168 hours. D-Dimer: No results for input(s): DDIMER in the last 72 hours. Hgb A1c: Recent Labs    05/11/19 2114  HGBA1C 6.9*   Lipid Profile: No results for input(s): CHOL, HDL, LDLCALC, TRIG, CHOLHDL, LDLDIRECT in the last 72 hours. Thyroid function studies: No results for input(s): TSH, T4TOTAL, T3FREE, THYROIDAB in the last 72 hours.  Invalid input(s): FREET3 Anemia work up: No results for input(s): VITAMINB12, FOLATE, FERRITIN, TIBC, IRON, RETICCTPCT in the last 72 hours. Sepsis Labs: Recent Labs  Lab 05/11/19 2114 05/11/19 2115 05/11/19 2257 05/12/19 0421 05/13/19 0344  WBC 13.6*  --   --  11.8* 11.6*  LATICACIDVEN  --  2.7* 1.9  --   --     Microbiology Recent Results (from the past 240 hour(s))  SARS CORONAVIRUS 2 (TAT 6-12 HRS) Nasal Swab Aptima Multi Swab     Status: None   Collection Time: 05/12/19 12:44 AM   Specimen: Aptima Multi Swab; Nasal Swab  Result Value Ref Range Status   SARS Coronavirus 2 NEGATIVE NEGATIVE Final    Comment: (NOTE) SARS-CoV-2 target nucleic acids are NOT DETECTED. The SARS-CoV-2 RNA is generally detectable in upper and lower respiratory specimens during the acute phase of infection. Negative results do not preclude SARS-CoV-2 infection, do not rule out co-infections with other pathogens, and should not be used as the sole basis for treatment or other patient management decisions. Negative results must be combined with clinical observations, patient history, and epidemiological information. The expected result is Negative. Fact Sheet for Patients: SugarRoll.be Fact Sheet for Healthcare Providers: https://www.woods-mathews.com/ This test is not yet approved or cleared by the [place] FDA and  has been authorized for  detection and/or diagnosis of SARS-CoV-2 by FDA under an Emergency Use Authorization (EUA). This EUA will remain  in effect (meaning this test can be used) for the duration of the COVID-19 declaration under Section 56 4(b)(1) of the Act, 21 U.S.C. section 360bbb-3(b)(1), unless the authorization is terminated or revoked sooner. Performed at [facility], [place] [address], [place], [place] [address]     Procedures and diagnostic studies:  Vas [place] Lower Extremity Venous (dvt)  Result Date: 05/12/2019  Lower Venous Study Indications: Edema, and Lymphedema, cellulitis.  Limitations: Body habitus, skin texture Comparison Study: Prior study from 07/23/18 is available for comparison Performing Technologist: Sharion Dove RVS  Examination Guidelines: A complete evaluation includes B-mode imaging, spectral Doppler, color Doppler, and power Doppler as needed of all accessible portions of each vessel. Bilateral testing is considered an integral part of a complete examination. Limited examinations for reoccurring indications may be performed as noted.  +---------+---------------+---------+-----------+----------+--------------+  RIGHT     Compressibility Phasicity Spontaneity Properties Thrombus Aging  +---------+---------------+---------+-----------+----------+--------------+  CFV       Full            Yes       Yes                                    +---------+---------------+---------+-----------+----------+--------------+  SFJ       Full                                                             +---------+---------------+---------+-----------+----------+--------------+  FV Prox   Full                                                             +---------+---------------+---------+-----------+----------+--------------+  FV Mid    Full                                                             +---------+---------------+---------+-----------+----------+--------------+  FV Distal Full                                                              +---------+---------------+---------+-----------+----------+--------------+  PFV       Full                                                             +---------+---------------+---------+-----------+----------+--------------+  POP       Full            Yes       Yes                                    +---------+---------------+---------+-----------+----------+--------------+  PTV       Full                                                             +---------+---------------+---------+-----------+----------+--------------+   Right Technical Findings: Not visualized segments include peroneal vein.  +---------+---------------+---------+-----------+----------+--------------+  LEFT      Compressibility Phasicity Spontaneity Properties Thrombus Aging  +---------+---------------+---------+-----------+----------+--------------+  CFV       Full  Yes       Yes                                    +---------+---------------+---------+-----------+----------+--------------+  SFJ       Full                                                             +---------+---------------+---------+-----------+----------+--------------+  FV Prox   Full                                                             +---------+---------------+---------+-----------+----------+--------------+  FV Mid    Full                                                             +---------+---------------+---------+-----------+----------+--------------+  FV Distal Full                                                             +---------+---------------+---------+-----------+----------+--------------+  PFV       Full                                                             +---------+---------------+---------+-----------+----------+--------------+  POP       Full            Yes       Yes                                    +---------+---------------+---------+-----------+----------+--------------+   Left  Technical Findings: Not visualized segments include Posterior tibial and peroneal veins.   Summary: Right: Findings appear essentially unchanged compared to previous examination. There is no evidence of deep vein thrombosis in the lower extremity. However, portions of this examination were limited- see technologist comments above. Left: Findings appear essentially unchanged compared to previous examination. There is no evidence of deep vein thrombosis in the lower extremity. However, portions of this examination were limited- see technologist comments above.  *See table(s) above for measurements and observations. Electronically signed by Deitra Mayo MD on 05/12/2019 at 6:07:08 PM.    Final     Medications:    aspirin EC  81 mg Oral Daily   enoxaparin (LOVENOX) injection  50 mg Subcutaneous Daily   furosemide  40 mg Oral Daily   latanoprost  1 drop Both Eyes QHS   pantoprazole  40  mg Oral Daily   potassium chloride SA  20 mEq Oral Daily   Continuous Infusions:   ceFAZolin (ANCEF) IV       LOS: 0 days   Geradine Girt  [facility]   How to contact the [facility] Attending or Consulting provider [place] or covering provider during after hours [place], for this patient?  1. Check the care team in [facility] and look for a) attending/consulting [facility] provider listed and b) the [facility] team listed 2. Log into [email or web address] and use [place]'s universal password to access. If you do not have the password, please contact the hospital operator. 3. Locate the [facility] provider you are looking for under [facility] and page to a number that you can be directly reached. 4. If you still have difficulty reaching the provider, please page the [facility] (Director on Call) for the Hospitalists listed on amion for assistance.  05/13/2019, 12:54 PM

## 2019-05-13 NOTE — Care Management Obs Status (Signed)
[facility] NOTIFICATION   Patient Details  Name: Jessica Yates MRN: [id number] Date of Birth: December 23, 1950   Medicare Observation Status Notification Given:  Yes    Claudie Leach, RN 05/13/2019, 9:36 AM

## 2019-05-13 NOTE — Plan of Care (Signed)
  Problem: Clinical Measurements: Goal: Ability to avoid or minimize complications of infection will improve Outcome: Progressing   Problem: Skin Integrity: Goal: Skin integrity will improve Outcome: Progressing   Problem: Activity: Goal: Risk for activity intolerance will decrease Outcome: Progressing   Problem: Pain Managment: Goal: General experience of comfort will improve Outcome: Progressing   Problem: Safety: Goal: Ability to remain free from injury will improve Outcome: Progressing   Problem: Skin Integrity: Goal: Risk for impaired skin integrity will decrease Outcome: Progressing

## 2019-05-14 ENCOUNTER — Encounter (HOSPITAL_COMMUNITY): Payer: Self-pay | Admitting: *Deleted

## 2019-05-14 LAB — BASIC METABOLIC PANEL
Anion gap: 10 (ref 5–15)
BUN: <5 mg/dL — ABNORMAL LOW (ref 8–23)
CO2: 24 mmol/L (ref 22–32)
Calcium: 9.2 mg/dL (ref 8.9–10.3)
Chloride: 103 mmol/L (ref 98–111)
Creatinine, Ser: 0.9 mg/dL (ref 0.44–1.00)
GFR calc Af Amer: >60 mL/min (ref >60)
GFR calc non Af Amer: >60 mL/min (ref >60)
Glucose, Bld: 246 mg/dL — ABNORMAL HIGH (ref 70–99)
Potassium: 3.6 mmol/L (ref 3.5–5.1)
Sodium: 137 mmol/L (ref 135–145)

## 2019-05-14 LAB — CBC
HCT: 30.6 % — ABNORMAL LOW (ref 36.0–46.0)
Hemoglobin: 10.2 g/dL — ABNORMAL LOW (ref 12.0–15.0)
MCH: 26.5 pg (ref 26.0–34.0)
MCHC: 33.3 g/dL (ref 30.0–36.0)
MCV: 79.5 fL — ABNORMAL LOW (ref 80.0–100.0)
Platelets: 189 10*3/uL (ref 150–400)
RBC: 3.85 MIL/uL — ABNORMAL LOW (ref 3.87–5.11)
RDW: 15.3 % (ref 11.5–15.5)
WBC: 13.2 10*3/uL — ABNORMAL HIGH (ref 4.0–10.5)
nRBC: 0 % (ref 0.0–0.2)

## 2019-05-14 LAB — GLUCOSE, CAPILLARY
Glucose-Capillary: 222 mg/dL — ABNORMAL HIGH (ref 70–99)
Glucose-Capillary: 201 mg/dL — ABNORMAL HIGH (ref 70–99)

## 2019-05-14 MED ORDER — INSULIN ASPART 100 UNIT/ML ~~LOC~~ SOLN
0.0000 [IU] | Freq: Every day | SUBCUTANEOUS | Status: DC
  Administered 2019-05-14: 22:00:00 2 [IU] via SUBCUTANEOUS

## 2019-05-14 MED ORDER — LIVING WELL WITH DIABETES BOOK
Freq: Once | Status: AC
  Administered 2019-05-14: 18:00:00
  Filled 2019-05-14: qty 1

## 2019-05-14 MED ORDER — INSULIN ASPART 100 UNIT/ML ~~LOC~~ SOLN
0.0000 [IU] | Freq: Three times a day (TID) | SUBCUTANEOUS | Status: DC
  Administered 2019-05-14: 3 [IU] via SUBCUTANEOUS
  Administered 2019-05-15: 2 [IU] via SUBCUTANEOUS

## 2019-05-14 NOTE — Progress Notes (Signed)
Inpatient Diabetes Program Recommendations  AACE/ADA: New Consensus Statement on Inpatient Glycemic Control (2015)  Target Ranges:  Prepandial:   less than 140 mg/dL      Peak postprandial:   less than 180 mg/dL (1-2 hours)      Critically ill patients:  140 - 180 mg/dL   Lab Results  Component Value Date   HGBA1C 6.9 (H) 05/11/2019    Review of Glycemic Control Results for Jessica Yates, Jessica Yates (MRN [id number]) as of 05/14/2019 13:34  Ref. Range 05/14/2019 10:41  Glucose Latest Ref Range: 70 - 99 mg/dL 246 (H)   Diabetes history: no hx noted Outpatient Diabetes medications: none Current orders for Inpatient glycemic control: Novolog 0-9 units TID, Novolog 0-5 units QHS  Inpatient Diabetes Program Recommendations:    Noted consult and new orders for SSI. Will continue to follow.  Spoke with patient regarding diagnosis of DM. Patient reports that her mother had diabetes.   Reviewed patient's current A1c of 6.9%. Explained what a A1c is and what it measures. Also reviewed goal A1c with patient, importance of good glucose control @ home, and blood sugar goals. Reviewed patho of DM, role of pancreas, survival skills of DM, target goals, impact of infection and blood sugars, vascular changes and comorbidites. Blood glucose meter kit (inlcudes lancets and strips) ([id number]). Encouraged to being learning how to check CBGs and begin checks 2 times per day. Reviewed extensively when to check, frequency, and when to call MD. Patient admits to drinking large amount of sugary beverages. Discussed with patient at length the importance of being mindful of carbohydrate intake. Patient seems confused during this conversation; expresses frustration why she cannot have juice and soda. Reviewed nutritional labels, educated on carbohydrates, importance of establishing goals and stressed mindfulness in an attempt to lower A1C. Noted dietitian consult placed by MD.  Assuming this patient will require  reinforcement.  LWWDM booklet ordered for patient to review.  Thanks, Bronson Curb, MSN, RNC-OB Diabetes Coordinator [phone] (8a-5p)

## 2019-05-14 NOTE — Progress Notes (Signed)
Progress Note    Jessica Yates  [id number] DOB: 09/04/1951  DOA: 05/11/2019 PCP: Sonia Side., FNP    Brief Narrative:    Medical records reviewed and are as summarized below:  Jessica Yates is an 68 y.o. female with history of chronic lymphedema, diastolic dysfunction previous history of DVT of the left lower extremity anemia presents to the ER because of ongoing right lower extremity pain and swelling with redness last 2 days.  Denies any trauma or insect bite.  Had some subjective fever fever chills.  Denies any chest pain shortness of breath productive cough nausea vomiting or diarrhea.  Assessment/Plan:   Principal Problem:   Cellulitis of right lower extremity Active Problems:   Lymphedema   CKD (chronic kidney disease) stage 3, GFR 30-59 ml/min (HCC)   Normochromic normocytic anemia   Cellulitis, leg   Cellulitis   Cellulitis of the right lower extremity with history of lymphedema  -narrowed to ancef from vanc/zosyn on 8/30 -add PRN pain medications Dopplers of the lower extremity negative for DVT cellulitis slow to improve, patient still with fever but it has lessened -blood cultures were not done in the ER-- if patient were to spike temperature, will get blood cultures  Lymphedema -compression stockings when her cellulitis resolves ? [name]-- defer to PCP  Chronic Diastolic dysfunction  -EF of 55 to 60% with grade 1 diastolic dysfunction but appears compensated.  On Lasix.   Normocytic normochromic anemia  -appears to be chronic-- defer to PCP  Hyperglycemia - hemoglobin A1c: 6.9 -can consider metformin at d/c-  -[facility] -diabetic coordinator plus nutrition consult -from speaking with patient suspect a large part is diet (taco [employer] slushies, coke floats etc)  Morbid obesity Body mass index is 36.05 kg/m.   Family Communication/Anticipated D/C date and plan/Code Status   DVT prophylaxis: Lovenox ordered. Code Status: Full Code.   Family Communication:  Disposition Plan: home 24-48 hours   Medical Consultants:    None.     Subjective:   Still with pain in leg but slowly improving  Objective:    Vitals:   05/13/19 0744 05/13/19 1926 05/14/19 0430 05/14/19 0758  BP: 110/65 137/90 116/69 125/72  Pulse: 93 98 98 99  Resp: 18 19 19 16   Temp: 99.9 F (37.7 C) 99.5 F (37.5 C) 98.6 F (37 C) 99.3 F (37.4 C)  TempSrc: Oral Oral Oral Oral  SpO2: 95% 99% 99% 97%  Weight:      Height:        Intake/Output Summary (Last 24 hours) at 05/14/2019 1353 Last data filed at 05/14/2019 0758 Gross per 24 hour  Intake 610 ml  Output 2625 ml  Net -2015 ml   Filed Weights   05/11/19 2054 05/12/19 0224  Weight: 104.3 kg 104.4 kg    Exam: Laying flat in bed, NAD Redness to knee, swelling continues to decrease, skin appears dry No increased work of breathing Pulses intact A+OX3  Data Reviewed:   I have personally reviewed following labs and imaging studies:  Labs: Labs show the following:   Basic Metabolic Panel: Recent Labs  Lab 05/11/19 2114 05/12/19 0421 05/13/19 0344 05/14/19 1041  NA 133* 136 135 137  K 3.9 3.8 3.9 3.6  CL 98 102 103 103  CO2 22 23 23 24   GLUCOSE 230* 209* 256* 246*  BUN 11 13 8  <5*  CREATININE 1.23* 1.14* 1.00 0.90  CALCIUM 9.9 9.5 9.1 9.2   GFR Estimated Creatinine  Clearance: 74.3 mL/min (by C-G formula based on SCr of 0.9 mg/dL). Liver Function Tests: Recent Labs  Lab 05/11/19 2114  AST 30  ALT 35  ALKPHOS 103  BILITOT 0.7  PROT 7.0  ALBUMIN 3.2*   No results for input(s): LIPASE, AMYLASE in the last 168 hours. No results for input(s): AMMONIA in the last 168 hours. Coagulation profile No results for input(s): INR, PROTIME in the last 168 hours.  CBC: Recent Labs  Lab 05/11/19 2114 05/12/19 0421 05/13/19 0344 05/14/19 1041  WBC 13.6* 11.8* 11.6* 13.2*  NEUTROABS 12.4*  --   --   --   HGB 11.0* 11.1* 9.7* 10.2*  HCT 33.7* 34.3* 30.0* 30.6*   MCV 81.2 81.3 81.3 79.5*  PLT 188 153 158 189   Cardiac Enzymes: No results for input(s): CKTOTAL, CKMB, CKMBINDEX, TROPONINI in the last 168 hours. BNP (last 3 results) No results for input(s): PROBNP in the last 8760 hours. CBG: No results for input(s): GLUCAP in the last 168 hours. D-Dimer: No results for input(s): DDIMER in the last 72 hours. Hgb A1c: Recent Labs    05/11/19 2114  HGBA1C 6.9*   Lipid Profile: No results for input(s): CHOL, HDL, LDLCALC, TRIG, CHOLHDL, LDLDIRECT in the last 72 hours. Thyroid function studies: No results for input(s): TSH, T4TOTAL, T3FREE, THYROIDAB in the last 72 hours.  Invalid input(s): FREET3 Anemia work up: No results for input(s): VITAMINB12, FOLATE, FERRITIN, TIBC, IRON, RETICCTPCT in the last 72 hours. Sepsis Labs: Recent Labs  Lab 05/11/19 2114 05/11/19 2115 05/11/19 2257 05/12/19 0421 05/13/19 0344 05/14/19 1041  WBC 13.6*  --   --  11.8* 11.6* 13.2*  LATICACIDVEN  --  2.7* 1.9  --   --   --     Microbiology Recent Results (from the past 240 hour(s))  SARS CORONAVIRUS 2 (TAT 6-12 HRS) Nasal Swab Aptima Multi Swab     Status: None   Collection Time: 05/12/19 12:44 AM   Specimen: Aptima Multi Swab; Nasal Swab  Result Value Ref Range Status   SARS Coronavirus 2 NEGATIVE NEGATIVE Final    Comment: (NOTE) SARS-CoV-2 target nucleic acids are NOT DETECTED. The SARS-CoV-2 RNA is generally detectable in upper and lower respiratory specimens during the acute phase of infection. Negative results do not preclude SARS-CoV-2 infection, do not rule out co-infections with other pathogens, and should not be used as the sole basis for treatment or other patient management decisions. Negative results must be combined with clinical observations, patient history, and epidemiological information. The expected result is Negative. Fact Sheet for Patients: SugarRoll.be Fact Sheet for Healthcare Providers:  https://www.woods-mathews.com/ This test is not yet approved or cleared by the [place] FDA and  has been authorized for detection and/or diagnosis of SARS-CoV-2 by FDA under an Emergency Use Authorization (EUA). This EUA will remain  in effect (meaning this test can be used) for the duration of the COVID-19 declaration under Section 56 4(b)(1) of the Act, 21 U.S.C. section 360bbb-3(b)(1), unless the authorization is terminated or revoked sooner. Performed at [facility], [place] [address], [place], [place] [address]     Procedures and diagnostic studies:  No results found.  Medications:   . aspirin EC  81 mg Oral Daily  . enoxaparin (LOVENOX) injection  50 mg Subcutaneous Daily  . furosemide  40 mg Oral Daily  . insulin aspart  0-5 Units Subcutaneous QHS  . insulin aspart  0-9 Units Subcutaneous TID WC  . latanoprost  1 drop  Both Eyes QHS  . living well with diabetes book   Does not apply Once  . pantoprazole  40 mg Oral Daily  . potassium chloride SA  20 mEq Oral Daily   Continuous Infusions: .  ceFAZolin (ANCEF) IV 2 g (05/14/19 0518)     LOS: 1 day   Geradine Girt  [facility]   How to contact the [facility] Attending or Consulting provider [place] or covering provider during after hours [place], for this patient?  1. Check the care team in [facility] and look for a) attending/consulting TRH provider listed and b) the [facility] team listed 2. Log into [email or web address] and use [place]'s universal password to access. If you do not have the password, please contact the hospital operator. 3. Locate the [facility] provider you are looking for under [facility] and page to a number that you can be directly reached. 4. If you still have difficulty reaching the provider, please page the [facility] (Director on Call) for the Hospitalists listed on amion for assistance.  05/14/2019, 1:53 PM

## 2019-05-15 LAB — CBC
HCT: 27.6 % — ABNORMAL LOW (ref 36.0–46.0)
Hemoglobin: 9.3 g/dL — ABNORMAL LOW (ref 12.0–15.0)
MCH: 26.5 pg (ref 26.0–34.0)
MCHC: 33.7 g/dL (ref 30.0–36.0)
MCV: 78.6 fL — ABNORMAL LOW (ref 80.0–100.0)
Platelets: 206 10*3/uL (ref 150–400)
RBC: 3.51 MIL/uL — ABNORMAL LOW (ref 3.87–5.11)
RDW: 15.3 % (ref 11.5–15.5)
WBC: 12 10*3/uL — ABNORMAL HIGH (ref 4.0–10.5)
nRBC: 0.2 % (ref 0.0–0.2)

## 2019-05-15 LAB — BASIC METABOLIC PANEL
Anion gap: 9 (ref 5–15)
BUN: 6 mg/dL — ABNORMAL LOW (ref 8–23)
CO2: 25 mmol/L (ref 22–32)
Calcium: 9.2 mg/dL (ref 8.9–10.3)
Chloride: 104 mmol/L (ref 98–111)
Creatinine, Ser: 0.96 mg/dL (ref 0.44–1.00)
GFR calc Af Amer: >60 mL/min (ref >60)
GFR calc non Af Amer: >60 mL/min (ref >60)
Glucose, Bld: 194 mg/dL — ABNORMAL HIGH (ref 70–99)
Potassium: 3.5 mmol/L (ref 3.5–5.1)
Sodium: 138 mmol/L (ref 135–145)

## 2019-05-15 LAB — GLUCOSE, CAPILLARY
Glucose-Capillary: 155 mg/dL — ABNORMAL HIGH (ref 70–99)
Glucose-Capillary: 279 mg/dL — ABNORMAL HIGH (ref 70–99)
Glucose-Capillary: 212 mg/dL — ABNORMAL HIGH (ref 70–99)
Glucose-Capillary: 247 mg/dL — ABNORMAL HIGH (ref 70–99)

## 2019-05-15 MED ORDER — INSULIN ASPART 100 UNIT/ML ~~LOC~~ SOLN
0.0000 [IU] | Freq: Every day | SUBCUTANEOUS | Status: DC
  Administered 2019-05-15: 22:00:00 2 [IU] via SUBCUTANEOUS

## 2019-05-15 MED ORDER — INSULIN ASPART 100 UNIT/ML ~~LOC~~ SOLN
0.0000 [IU] | Freq: Three times a day (TID) | SUBCUTANEOUS | Status: DC
  Administered 2019-05-15: 5 [IU] via SUBCUTANEOUS
  Administered 2019-05-15: 18:00:00 2 [IU] via SUBCUTANEOUS
  Administered 2019-05-16 (×2): 5 [IU] via SUBCUTANEOUS
  Administered 2019-05-16: 17:00:00 8 [IU] via SUBCUTANEOUS
  Administered 2019-05-17 – 2019-05-18 (×3): 3 [IU] via SUBCUTANEOUS
  Administered 2019-05-18: 5 [IU] via SUBCUTANEOUS
  Administered 2019-05-19: 17:00:00 2 [IU] via SUBCUTANEOUS
  Administered 2019-05-19 (×2): 3 [IU] via SUBCUTANEOUS
  Administered 2019-05-20: 08:00:00 2 [IU] via SUBCUTANEOUS

## 2019-05-15 NOTE — Plan of Care (Signed)
  RD consulted for nutrition education regarding diabetes.   Lab Results  Component Value Date   HGBA1C 6.9 (H) 05/11/2019    RD provided "Carbohydrate Counting for People with Diabetes" handout from the Academy of Nutrition and Dietetics. Discussed different food groups and their effects on blood sugar, emphasizing carbohydrate-containing foods. Provided list of carbohydrates and recommended serving sizes of common foods.  Discussed importance of controlled and consistent carbohydrate intake throughout the day. Provided examples of ways to balance meals/snacks and encouraged intake of high-fiber, whole grain complex carbohydrates. Discussed diabetic friendly drink options. Teach back method used.  Expect good compliance.  Body mass index is 36.05 kg/m. Pt meets criteria for class II obesity based on current BMI.  Current diet order is heart healthy/carbohydrate modified, patient is consuming approximately 100% of meals at this time. Labs and medications reviewed. No further nutrition interventions warranted at this time. RD contact information provided. If additional nutrition issues arise, please re-consult RD.  Corrin Parker, MS, RD, LDN Pager # [phone] After hours/ weekend pager # [phone]

## 2019-05-15 NOTE — Progress Notes (Signed)
Progress Note    Jessica Yates  [id number] DOB: November 05, 1950  DOA: 05/11/2019 PCP: Sonia Side., FNP    Brief Narrative:    Medical records reviewed and are as summarized below:  Jessica Yates is an 68 y.o. female with history of chronic lymphedema, diastolic dysfunction previous history of DVT of the left lower extremity anemia presents to the ER because of ongoing right lower extremity pain and swelling with redness last 2 days.  Cellulitis improving albeit slowly.    Assessment/Plan:   Principal Problem:   Cellulitis of right lower extremity Active Problems:   Lymphedema   CKD (chronic kidney disease) stage 3, GFR 30-59 ml/min (HCC)   Normochromic normocytic anemia   Cellulitis, leg   Cellulitis   Cellulitis of the right lower extremity with history of lymphedema  -narrowed to ancef from vanc/zosyn on 8/30 -add PRN pain medications Dopplers of the lower extremity negative for DVT cellulitis slow to improve -elevate extremity -blood cultures were not done in the ER-- if patient were to spike temperature, will get blood cultures  Lymphedema -compression stockings when her cellulitis resolves ? [name]-- defer to PCP  Chronic Diastolic dysfunction  -EF of 55 to 60% with grade 1 diastolic dysfunction but appears compensated.  On Lasix.   Normocytic normochromic anemia  -appears to be chronic-- defer to PCP  Hyperglycemia - hemoglobin A1c: 6.9 -can consider metformin at d/c- suspect dietary changes will make the biggest impact -[facility] -diabetic coordinator plus nutrition consult -from speaking with patient suspect a large part is diet (taco bell slushies, coke floats etc)  Morbid obesity Body mass index is 36.05 kg/m.   Family Communication/Anticipated D/C date and plan/Code Status   DVT prophylaxis: Lovenox ordered. Code Status: Full Code.  Family Communication:  Disposition Plan: home 24-48 hours   Medical Consultants:    None.      Subjective:   Pain better today Multiple questions answered regarding diabetes including if babies are born with type 1 DM  Objective:    Vitals:   05/14/19 1542 05/14/19 1925 05/15/19 0442 05/15/19 0733  BP: 122/71 116/68 119/81 121/80  Pulse: 98 98 79 85  Resp: 16 18 18 18   Temp: 99.1 F (37.3 C) 97.8 F (36.6 C) 98.3 F (36.8 C) 98.3 F (36.8 C)  TempSrc: Oral Oral Oral Oral  SpO2: 98% 100% 100% 98%  Weight:      Height:        Intake/Output Summary (Last 24 hours) at 05/15/2019 1147 Last data filed at 05/15/2019 0900 Gross per 24 hour  Intake 240 ml  Output 3251 ml  Net -3011 ml   Filed Weights   05/11/19 2054 05/12/19 0224  Weight: 104.3 kg 104.4 kg    Exam: In bed, NAD rrr No increased work of breathing Alert Right leg larger than left, redness extending to below right knee, no open areas of draining wounds  Data Reviewed:   I have personally reviewed following labs and imaging studies:  Labs: Labs show the following:   Basic Metabolic Panel: Recent Labs  Lab 05/11/19 2114 05/12/19 0421 05/13/19 0344 05/14/19 1041 05/15/19 0246  NA 133* 136 135 137 138  K 3.9 3.8 3.9 3.6 3.5  CL 98 102 103 103 104  CO2 22 23 23 24 25   GLUCOSE 230* 209* 256* 246* 194*  BUN 11 13 8  <5* 6*  CREATININE 1.23* 1.14* 1.00 0.90 0.96  CALCIUM 9.9 9.5 9.1 9.2 9.2  GFR Estimated Creatinine Clearance: 69.7 mL/min (by C-G formula based on SCr of 0.96 mg/dL). Liver Function Tests: Recent Labs  Lab 05/11/19 2114  AST 30  ALT 35  ALKPHOS 103  BILITOT 0.7  PROT 7.0  ALBUMIN 3.2*   No results for input(s): LIPASE, AMYLASE in the last 168 hours. No results for input(s): AMMONIA in the last 168 hours. Coagulation profile No results for input(s): INR, PROTIME in the last 168 hours.  CBC: Recent Labs  Lab 05/11/19 2114 05/12/19 0421 05/13/19 0344 05/14/19 1041 05/15/19 0246  WBC 13.6* 11.8* 11.6* 13.2* 12.0*  NEUTROABS 12.4*  --   --   --   --   HGB  11.0* 11.1* 9.7* 10.2* 9.3*  HCT 33.7* 34.3* 30.0* 30.6* 27.6*  MCV 81.2 81.3 81.3 79.5* 78.6*  PLT 188 153 158 189 206   Cardiac Enzymes: No results for input(s): CKTOTAL, CKMB, CKMBINDEX, TROPONINI in the last 168 hours. BNP (last 3 results) No results for input(s): PROBNP in the last 8760 hours. CBG: Recent Labs  Lab 05/14/19 1728 05/14/19 2030 05/15/19 0639 05/15/19 1140  GLUCAP 222* 201* 155* 279*   D-Dimer: No results for input(s): DDIMER in the last 72 hours. Hgb A1c: No results for input(s): HGBA1C in the last 72 hours. Lipid Profile: No results for input(s): CHOL, HDL, LDLCALC, TRIG, CHOLHDL, LDLDIRECT in the last 72 hours. Thyroid function studies: No results for input(s): TSH, T4TOTAL, T3FREE, THYROIDAB in the last 72 hours.  Invalid input(s): FREET3 Anemia work up: No results for input(s): VITAMINB12, FOLATE, FERRITIN, TIBC, IRON, RETICCTPCT in the last 72 hours. Sepsis Labs: Recent Labs  Lab 05/11/19 2115 05/11/19 2257 05/12/19 0421 05/13/19 0344 05/14/19 1041 05/15/19 0246  WBC  --   --  11.8* 11.6* 13.2* 12.0*  LATICACIDVEN 2.7* 1.9  --   --   --   --     Microbiology Recent Results (from the past 240 hour(s))  SARS CORONAVIRUS 2 (TAT 6-12 HRS) Nasal Swab Aptima Multi Swab     Status: None   Collection Time: 05/12/19 12:44 AM   Specimen: Aptima Multi Swab; Nasal Swab  Result Value Ref Range Status   SARS Coronavirus 2 NEGATIVE NEGATIVE Final    Comment: (NOTE) SARS-CoV-2 target nucleic acids are NOT DETECTED. The SARS-CoV-2 RNA is generally detectable in upper and lower respiratory specimens during the acute phase of infection. Negative results do not preclude SARS-CoV-2 infection, do not rule out co-infections with other pathogens, and should not be used as the sole basis for treatment or other patient management decisions. Negative results must be combined with clinical observations, patient history, and epidemiological information. The  expected result is Negative. Fact Sheet for Patients: SugarRoll.be Fact Sheet for Healthcare Providers: https://www.woods-mathews.com/ This test is not yet approved or cleared by the [place] FDA and  has been authorized for detection and/or diagnosis of SARS-CoV-2 by FDA under an Emergency Use Authorization (EUA). This EUA will remain  in effect (meaning this test can be used) for the duration of the COVID-19 declaration under Section 56 4(b)(1) of the Act, 21 U.S.C. section 360bbb-3(b)(1), unless the authorization is terminated or revoked sooner. Performed at [facility], [place] [address] [place], [place] [address]     Procedures and diagnostic studies:  No results found.  Medications:   . aspirin EC  81 mg Oral Daily  . enoxaparin (LOVENOX) injection  50 mg Subcutaneous Daily  . furosemide  40 mg Oral Daily  . insulin aspart  0-5 Units Subcutaneous QHS  . insulin aspart  0-9 Units Subcutaneous TID WC  . latanoprost  1 drop Both Eyes QHS  . pantoprazole  40 mg Oral Daily  . potassium chloride SA  20 mEq Oral Daily   Continuous Infusions: .  ceFAZolin (ANCEF) IV 2 g (05/15/19 0636)     LOS: 2 days   Geradine Girt  [facility]   How to contact the [facility] Attending or Consulting provider [place] or covering provider during after hours [place], for this patient?  1. Check the care team in [facility] and look for a) attending/consulting TRH provider listed and b) the [facility] team listed 2. Log into [email or web address] and use [place]'s universal password to access. If you do not have the password, please contact the hospital operator. 3. Locate the [facility] provider you are looking for under [facility] and page to a number that you can be directly reached. 4. If you still have difficulty reaching the provider, please page the [facility] (Director on Call) for the Hospitalists listed on amion for assistance.  05/15/2019, 11:47 AM

## 2019-05-16 LAB — GLUCOSE, CAPILLARY
Glucose-Capillary: 219 mg/dL — ABNORMAL HIGH (ref 70–99)
Glucose-Capillary: 234 mg/dL — ABNORMAL HIGH (ref 70–99)
Glucose-Capillary: 289 mg/dL — ABNORMAL HIGH (ref 70–99)
Glucose-Capillary: 171 mg/dL — ABNORMAL HIGH (ref 70–99)

## 2019-05-16 MED ORDER — LORATADINE 10 MG PO TABS
10.0000 mg | ORAL_TABLET | Freq: Every day | ORAL | Status: DC
  Administered 2019-05-16 – 2019-05-20 (×5): 10 mg via ORAL
  Filled 2019-05-16 (×5): qty 1

## 2019-05-16 NOTE — Progress Notes (Signed)
PROGRESS NOTE  Jessica Yates [id number] DOB: 1951-01-24 DOA: 05/11/2019 PCP: Jessica Yates., FNP  HPI/Recap of past 24 hours: Jessica Yates is an 68 y.o. female withhistory of chronic lymphedema, diastolic dysfunction previous history of DVT of the left lower extremity anemia presents to the ER because of ongoing right lower extremity pain and swelling with redness last 2 days. Cellulitis improving albeit slowly.    05/16/19: Patient was seen and examined at bedside.  Reporting pain in her right lower extremity.  She had many questions all were answered to her satisfaction.  Assessment/Plan: Principal Problem:   Cellulitis of right lower extremity Active Problems:   Lymphedema   CKD (chronic kidney disease) stage 3, GFR 30-59 ml/min (HCC)   Normochromic normocytic anemia   Cellulitis, leg   Cellulitis   Sepsis secondary to right lower extremity cellulitis in the setting of lymphedema Presented with fever with T-max of 100.7, leukocytosis, tachycardia, right lower extremity edema, tenderness to touch, erythema and warmth Started on Ancef on admission T-max 100.0 overnight Obtain blood cultures x2 peripherally Monitor fever curve and WBC Obtain CBC with differential tomorrow morning Optimize pain control  Worsening right lower extremity edema in the setting of lymphedema Lower extremity Doppler ultrasound negative for DVT No compression stockings indicated at this time due to active infection, cellulitis. Switch oral Lasix to IV Continue to monitor  Ambulatory dysfunction/physical debility PT OT to assess Fall precautions  Bilateral lower extremity lymphedema Resume compression stockings when her cellulitis has resolved  Type 2 diabetes with hyperglycemia Hemoglobin A1c 6.9 Continue insulin sliding scale Avoid hypoglycemia  Chronic diastolic CHF Last 2D echo LVEF 55 to 60% with grade 1 diastolic dysfunction No acute issues Continue strict I's and O's  and daily weight Continue diuretics   DVT prophylaxis:  Subcu Lovenox daily Code Status: Full Code.  Family Communication:  None at bedside. Disposition Plan: home 24-48 hours on symptomatology is improving.     Objective: Vitals:   05/15/19 2111 05/15/19 2320 05/16/19 0426 05/16/19 0734  BP: 103/70 123/82 (!) 144/80 119/73  Pulse: (!) 113 97 96 93  Resp: 20  20 19   Temp: 99.4 F (37.4 C) 100 F (37.8 C) 99.1 F (37.3 C) 98.3 F (36.8 C)  TempSrc: Oral Oral Oral Oral  SpO2: 96% 100% 99% 97%  Weight:      Height:        Intake/Output Summary (Last 24 hours) at 05/16/2019 1502 Last data filed at 05/16/2019 0500 Gross per 24 hour  Intake 720 ml  Output 701 ml  Net 19 ml   Filed Weights   05/11/19 2054 05/12/19 0224  Weight: 104.3 kg 104.4 kg    Exam:  . General: 68 y.o. year-old female well developed well nourished in no acute distress.  Alert and oriented x3. . Cardiovascular: Regular rate and rhythm with no rubs or gallops.  No thyromegaly or JVD noted.   Marland Kitchen Respiratory: Clear to auscultation with no wheezes or rales. Good inspiratory effort. . Abdomen: Soft nontender nondistended with normal bowel sounds x4 quadrants. . Musculoskeletal: Right lower extremity edematous, tender with touch, warm and erythematous.  Pulses 2 out of 4 in all 4 extremities. Marland Kitchen Psychiatry: Mood is appropriate for condition and setting   Data Reviewed: CBC: Recent Labs  Lab 05/11/19 2114 05/12/19 0421 05/13/19 0344 05/14/19 1041 05/15/19 0246  WBC 13.6* 11.8* 11.6* 13.2* 12.0*  NEUTROABS 12.4*  --   --   --   --  HGB 11.0* 11.1* 9.7* 10.2* 9.3*  HCT 33.7* 34.3* 30.0* 30.6* 27.6*  MCV 81.2 81.3 81.3 79.5* 78.6*  PLT 188 153 158 189 [id number]   Basic Metabolic Panel: Recent Labs  Lab 05/11/19 2114 05/12/19 0421 05/13/19 0344 05/14/19 1041 05/15/19 0246  NA 133* 136 135 137 138  K 3.9 3.8 3.9 3.6 3.5  CL 98 102 103 103 104  CO2 22 23 23 24 25   GLUCOSE 230* 209* 256* 246* 194*   BUN 11 13 8  <5* 6*  CREATININE 1.23* 1.14* 1.00 0.90 0.96  CALCIUM 9.9 9.5 9.1 9.2 9.2   GFR: Estimated Creatinine Clearance: 69.7 mL/min (by C-G formula based on SCr of 0.96 mg/dL). Liver Function Tests: Recent Labs  Lab 05/11/19 2114  AST 30  ALT 35  ALKPHOS 103  BILITOT 0.7  PROT 7.0  ALBUMIN 3.2*   No results for input(s): LIPASE, AMYLASE in the last 168 hours. No results for input(s): AMMONIA in the last 168 hours. Coagulation Profile: No results for input(s): INR, PROTIME in the last 168 hours. Cardiac Enzymes: No results for input(s): CKTOTAL, CKMB, CKMBINDEX, TROPONINI in the last 168 hours. BNP (last 3 results) No results for input(s): PROBNP in the last 8760 hours. HbA1C: No results for input(s): HGBA1C in the last 72 hours. CBG: Recent Labs  Lab 05/15/19 1140 05/15/19 1708 05/15/19 2123 05/16/19 0630 05/16/19 1251  GLUCAP 279* 212* 247* 219* 234*   Lipid Profile: No results for input(s): CHOL, HDL, LDLCALC, TRIG, CHOLHDL, LDLDIRECT in the last 72 hours. Thyroid Function Tests: No results for input(s): TSH, T4TOTAL, FREET4, T3FREE, THYROIDAB in the last 72 hours. Anemia Panel: No results for input(s): VITAMINB12, FOLATE, FERRITIN, TIBC, IRON, RETICCTPCT in the last 72 hours. Urine analysis: No results found for: COLORURINE, APPEARANCEUR, LABSPEC, PHURINE, GLUCOSEU, HGBUR, BILIRUBINUR, KETONESUR, PROTEINUR, UROBILINOGEN, NITRITE, LEUKOCYTESUR Sepsis Labs: @LABRCNTIP (procalcitonin:4,lacticidven:4)  ) Recent Results (from the past 240 hour(s))  SARS CORONAVIRUS 2 (TAT 6-12 HRS) Nasal Swab Aptima Multi Swab     Status: None   Collection Time: 05/12/19 12:44 AM   Specimen: Aptima Multi Swab; Nasal Swab  Result Value Ref Range Status   SARS Coronavirus 2 NEGATIVE NEGATIVE Final    Comment: (NOTE) SARS-CoV-2 target nucleic acids are NOT DETECTED. The SARS-CoV-2 RNA is generally detectable in upper and lower respiratory specimens during the acute phase of  infection. Negative results do not preclude SARS-CoV-2 infection, do not rule out co-infections with other pathogens, and should not be used as the sole basis for treatment or other patient management decisions. Negative results must be combined with clinical observations, patient history, and epidemiological information. The expected result is Negative. Fact Sheet for Patients: SugarRoll.be Fact Sheet for Healthcare Providers: https://www.woods-mathews.com/ This test is not yet approved or cleared by the [place] FDA and  has been authorized for detection and/or diagnosis of SARS-CoV-2 by FDA under an Emergency Use Authorization (EUA). This EUA will remain  in effect (meaning this test can be used) for the duration of the COVID-19 declaration under Section 56 4(b)(1) of the Act, 21 U.S.C. section 360bbb-3(b)(1), unless the authorization is terminated or revoked sooner. Performed at [facility], [place] [address] [place], [place] [address]       Studies: No results found.  Scheduled Meds: . aspirin EC  81 mg Oral Daily  . enoxaparin (LOVENOX) injection  50 mg Subcutaneous Daily  . furosemide  40 mg Oral Daily  . insulin aspart  0-15 Units Subcutaneous TID WC  .  insulin aspart  0-5 Units Subcutaneous QHS  . latanoprost  1 drop Both Eyes QHS  . pantoprazole  40 mg Oral Daily  . potassium chloride SA  20 mEq Oral Daily    Continuous Infusions: .  ceFAZolin (ANCEF) IV 2 g (05/16/19 1328)     LOS: 3 days     Kayleen Memos, MD [facility] Pager [phone]  If 7PM-7AM, please contact night-coverage [email or web address] Password [email or web address] 05/16/2019, 3:02 PM

## 2019-05-16 NOTE — Progress Notes (Signed)
Inpatient Diabetes Program Recommendations  AACE/ADA: New Consensus Statement on Inpatient Glycemic Control (2015)  Target Ranges:  Prepandial:   less than 140 mg/dL      Peak postprandial:   less than 180 mg/dL (1-2 hours)      Critically ill patients:  140 - 180 mg/dL   Lab Results  Component Value Date   GLUCAP 219 (H) 05/16/2019   HGBA1C 6.9 (H) 05/11/2019    Review of Glycemic Control Results for Jessica Yates, Jessica Yates (MRN [id number]) as of 05/16/2019 10:29  Ref. Range 05/15/2019 06:39 05/15/2019 11:40 05/15/2019 17:08 05/15/2019 21:23 05/16/2019 06:30  Glucose-Capillary Latest Ref Range: 70 - 99 mg/dL 155 (H) 279 (H) 212 (H) 247 (H) 219 (H)   Diabetes history: DM 2- New diagnosis Outpatient Diabetes medications: None Current orders for Inpatient glycemic control:  Novolog moderate tid with meals and HS  Inpatient Diabetes Program Recommendations:    While in the hospital, consider adding Lantus 15 units daily.  She will not need insulin at d/c and can transition to oral agents.   Thanks Adah Perl, RN, BC-ADM Inpatient Diabetes Coordinator Pager [phone] (8a-5p)

## 2019-05-17 LAB — CBC WITH DIFFERENTIAL/PLATELET
Abs Immature Granulocytes: 0.51 10*3/uL — ABNORMAL HIGH (ref 0.00–0.07)
Basophils Absolute: 0 10*3/uL (ref 0.0–0.1)
Basophils Relative: 0 %
Eosinophils Absolute: 0.2 10*3/uL (ref 0.0–0.5)
Eosinophils Relative: 2 %
HCT: 33.1 % — ABNORMAL LOW (ref 36.0–46.0)
Hemoglobin: 10.9 g/dL — ABNORMAL LOW (ref 12.0–15.0)
Immature Granulocytes: 4 %
Lymphocytes Relative: 13 %
Lymphs Abs: 1.7 10*3/uL (ref 0.7–4.0)
MCH: 25.9 pg — ABNORMAL LOW (ref 26.0–34.0)
MCHC: 32.9 g/dL (ref 30.0–36.0)
MCV: 78.6 fL — ABNORMAL LOW (ref 80.0–100.0)
Monocytes Absolute: 0.7 10*3/uL (ref 0.1–1.0)
Monocytes Relative: 5 %
Neutro Abs: 10 10*3/uL — ABNORMAL HIGH (ref 1.7–7.7)
Neutrophils Relative %: 76 %
Platelets: 326 10*3/uL (ref 150–400)
RBC: 4.21 MIL/uL (ref 3.87–5.11)
RDW: 15.8 % — ABNORMAL HIGH (ref 11.5–15.5)
WBC: 13.1 10*3/uL — ABNORMAL HIGH (ref 4.0–10.5)
nRBC: 0.2 % (ref 0.0–0.2)

## 2019-05-17 LAB — BASIC METABOLIC PANEL
Anion gap: 12 (ref 5–15)
BUN: 7 mg/dL — ABNORMAL LOW (ref 8–23)
CO2: 26 mmol/L (ref 22–32)
Calcium: 9.9 mg/dL (ref 8.9–10.3)
Chloride: 101 mmol/L (ref 98–111)
Creatinine, Ser: 0.95 mg/dL (ref 0.44–1.00)
GFR calc Af Amer: >60 mL/min (ref >60)
GFR calc non Af Amer: >60 mL/min (ref >60)
Glucose, Bld: 220 mg/dL — ABNORMAL HIGH (ref 70–99)
Potassium: 3.5 mmol/L (ref 3.5–5.1)
Sodium: 139 mmol/L (ref 135–145)

## 2019-05-17 LAB — GLUCOSE, CAPILLARY
Glucose-Capillary: 162 mg/dL — ABNORMAL HIGH (ref 70–99)
Glucose-Capillary: 183 mg/dL — ABNORMAL HIGH (ref 70–99)
Glucose-Capillary: 118 mg/dL — ABNORMAL HIGH (ref 70–99)
Glucose-Capillary: 194 mg/dL — ABNORMAL HIGH (ref 70–99)

## 2019-05-17 LAB — PROCALCITONIN: Procalcitonin: 0.53 ng/mL

## 2019-05-17 LAB — MRSA PCR SCREENING: MRSA by PCR: NEGATIVE

## 2019-05-17 MED ORDER — INSULIN GLARGINE 100 UNIT/ML ~~LOC~~ SOLN
10.0000 [IU] | Freq: Every day | SUBCUTANEOUS | Status: DC
  Administered 2019-05-17: 10 [IU] via SUBCUTANEOUS
  Filled 2019-05-17 (×2): qty 0.1

## 2019-05-17 MED ORDER — FUROSEMIDE 10 MG/ML IJ SOLN
40.0000 mg | Freq: Every day | INTRAMUSCULAR | Status: DC
  Administered 2019-05-17 – 2019-05-18 (×2): 40 mg via INTRAVENOUS
  Filled 2019-05-17 (×3): qty 4

## 2019-05-17 NOTE — Progress Notes (Signed)
Inpatient Diabetes Program Recommendations  AACE/ADA: New Consensus Statement on Inpatient Glycemic Control (2015)  Target Ranges:  Prepandial:   less than 140 mg/dL      Peak postprandial:   less than 180 mg/dL (1-2 hours)      Critically ill patients:  140 - 180 mg/dL   Lab Results  Component Value Date   GLUCAP 162 (H) 05/17/2019   HGBA1C 6.9 (H) 05/11/2019    Review of Glycemic Control Results for Jessica Yates, Jessica Yates (MRN [id number]) as of 05/17/2019 09:21  Ref. Range 05/16/2019 06:30 05/16/2019 12:51 05/16/2019 16:23 05/16/2019 21:19 05/17/2019 06:32  Glucose-Capillary Latest Ref Range: 70 - 99 mg/dL 219 (H) 234 (H) 289 (H) 171 (H) 162 (H)   Diabetes history: DM 2- New diagnosis Outpatient Diabetes medications: None Current orders for Inpatient glycemic control:  Novolog moderate tid with meals and HS  Inpatient Diabetes Program Recommendations:    Consider adding Lantus 15 units daily.  She will not need insulin at d/c and can transition to oral agents.   Thanks, Bronson Curb, MSN, RNC-OB Diabetes Coordinator [phone] (8a-5p)

## 2019-05-17 NOTE — Evaluation (Signed)
Physical Therapy Evaluation Patient Details Name: Jessica Yates MRN: [id number] DOB: 1950/11/05 Today's Date: 05/17/2019   History of Present Illness  Pt is a 68 y.o. female admitted 05/11/19 with ongoing RLE pain and swelling. Worked up for sepsis secondary to RLE cellulitis in the setting of lymphedema. PMH includes chronic lymphedema, DVT, glaucoma, depression.    Clinical Impression  Pt presents with an overall decrease in functional mobility secondary to above. PTA, pt mod indep with SPC/RW, lives with siblings. Today, pt able to transfer and ambulate with RW at supervision-level, limited by pain; recommend use of RW when limited by increased LE pain. Pt would benefit from continued acute PT services to maximize functional mobility and independence prior to d/c with HHPT services.     Follow Up Recommendations Home health PT;Supervision - Intermittent(pt requesting HHPT, HHRN and aide)    Equipment Recommendations  None recommended by PT    Recommendations for Other Services       Precautions / Restrictions Precautions Precautions: Fall Precaution Comments: Urine incontinence (wears depends at home) Restrictions Weight Bearing Restrictions: No      Mobility  Bed Mobility               General bed mobility comments: Received sitting in recliner  Transfers Overall transfer level: Needs assistance Equipment used: Rolling walker (2 wheeled) Transfers: Sit to/from Stand Sit to Stand: Supervision         General transfer comment: Stood multiple times from recliner and BSC with RW with supervision; reliant on BUE support and momentum  Ambulation/Gait Ambulation/Gait assistance: Supervision Gait Distance (Feet): 40 Feet Assistive device: Rolling walker (2 wheeled) Gait Pattern/deviations: Step-through pattern;Decreased stride length;Antalgic;Trunk flexed Gait velocity: Decreased   General Gait Details: Slow, antalgic gait with RW, supervision for safety. Moving  well despite c/o BLE pain  Stairs            Wheelchair Mobility    Modified Rankin (Stroke Patients Only)       Balance Overall balance assessment: Needs assistance   Sitting balance-Leahy Scale: Good Sitting balance - Comments: Able to reach bilateral feet to don underwear while seated in recliner     Standing balance-Leahy Scale: Fair Standing balance comment: Can static stand without UE support                             Pertinent Vitals/Pain Pain Assessment: Faces Faces Pain Scale: Hurts a little bit Pain Location: BLEs Pain Descriptors / Indicators: Sore;Heaviness Pain Intervention(s): Monitored during session    Home Living Family/patient expects to be discharged to:: Private residence Living Arrangements: Other relatives Available Help at Discharge: Family;Available PRN/intermittently Type of Home: House Home Access: Stairs to enter   Entrance Stairs-Number of Steps: 1 Home Layout: One level Home Equipment: Cane - single point;Walker - 4 wheels Additional Comments: Lives with brother and sister    Prior Function Level of Independence: Independent with assistive device(s)         Comments: Mod indep with SPC or rollator. Sister drives     Hand Dominance        Extremity/Trunk Assessment   Upper Extremity Assessment Upper Extremity Assessment: Overall WFL for tasks assessed    Lower Extremity Assessment Lower Extremity Assessment: Generalized weakness(Bilateral lower leg edema (RLE>LLE))       Communication   Communication: No difficulties  Cognition Arousal/Alertness: Awake/alert Behavior During Therapy: WFL for tasks assessed/performed Overall Cognitive Status: Within Functional Limits  for tasks assessed                                 General Comments: [facility] for simple tasks, although tangential with speech and repetitive      General Comments      Exercises Other Exercises Other Exercises: HEP  handout provided ([employer] #[id number]): ankle pumps, LAQ, seated march, SLR, supine abd/add   Assessment/Plan    PT Assessment Patient needs continued PT services  PT Problem List Decreased strength;Decreased activity tolerance;Decreased balance;Decreased mobility;Pain       PT Treatment Interventions DME instruction;Gait training;Stair training;Functional mobility training;Therapeutic activities;Therapeutic exercise;Balance training;Patient/family education    PT Goals (Current goals can be found in the Care Plan section)  Acute Rehab PT Goals Patient Stated Goal: Return home with HHPT/RN/aide PT Goal Formulation: With patient Time For Goal Achievement: 05/31/19 Potential to Achieve Goals: Good    Frequency Min 3X/week   Barriers to discharge        Co-evaluation               AM-PAC PT "6 Clicks" Mobility  Outcome Measure Help needed turning from your back to your side while in a flat bed without using bedrails?: None Help needed moving from lying on your back to sitting on the side of a flat bed without using bedrails?: None Help needed moving to and from a bed to a chair (including a wheelchair)?: None Help needed standing up from a chair using your arms (e.g., wheelchair or bedside chair)?: A Little Help needed to walk in hospital room?: A Little Help needed climbing 3-5 steps with a railing? : A Little 6 Click Score: 21    End of Session   Activity Tolerance: Patient tolerated treatment well;Patient limited by pain Patient left: in chair;with call bell/phone within reach Nurse Communication: Mobility status PT Visit Diagnosis: Other abnormalities of gait and mobility (R26.89);Pain    Time: 1258-1330 PT Time Calculation (min) (ACUTE ONLY): 32 min   Charges:   PT Evaluation $PT Eval Moderate Complexity: 1 Mod PT Treatments $Therapeutic Activity: 8-22 mins   Mabeline Caras, PT, DPT Acute Rehabilitation Services  Pager [phone] Office  [place] 05/17/2019, 1:50 PM

## 2019-05-17 NOTE — Progress Notes (Signed)
PROGRESS NOTE  Jessica Yates [id number] DOB: 1951-05-05 DOA: 05/11/2019 PCP: Sonia Side., FNP  HPI/Recap of past 24 hours: Jessica Yates is an 68 y.o. female withhistory of chronic lymphedema, diastolic dysfunction previous history of DVT of the left lower extremity anemia presents to the ER because of ongoing right lower extremity pain and swelling with redness last 2 days. Cellulitis improving albeit slowly.   05/17/19: Patient was seen and examined at her bedside this morning.  Reports right lower extremity tenderness is improving.  Also reports some generalized weakness.  PT OT consulted to further assess.   On fall precautions.   Assessment/Plan: Principal Problem:   Cellulitis of right lower extremity Active Problems:   Lymphedema   CKD (chronic kidney disease) stage 3, GFR 30-59 ml/min (HCC)   Normochromic normocytic anemia   Cellulitis, leg   Cellulitis   Sepsis secondary to right lower extremity cellulitis in the setting of lymphedema Presented with fever with T-max of 100.7, leukocytosis, tachycardia, right lower extremity edema, tenderness to touch, erythema and warmth Started on Ancef on admission Blood cultures x2 peripherally in process Leukocytosis improved on 05/15/2019 from 13K to 12K Symptomatology is also improving Obtain MRSA screening and switch to oral antibiotics possibly tomorrow 05/18/19 Repeat CBC with differential on 05/17/2019 Continue to monitor fever curve and WBC CBC with differentials pending  Worsening right lower extremity edema in the setting of lymphedema Right lower extremity Doppler ultrasound negative for DVT No compression stockings indicated at this time due to active infection, cellulitis. Currently on IV Lasix 40 mg daily with improvement of right lower extremity edema  Ambulatory dysfunction/physical debility PT OT to assess On fall precautions  Bilateral lower extremity lymphedema Resume compression stockings when  her cellulitis has resolved  Type 2 diabetes with hyperglycemia Hemoglobin A1c 6.9 Added Lantus 10 units daily due to uncontrolled diabetes with hyperglycemia Continue insulin sliding scale  Chronic diastolic CHF Last 2D echo LVEF 55 to 60% with grade 1 diastolic dysfunction No acute issues Continue strict I's and O's and daily weight Net I&O -6.6 L since admission Continue diuretics, IV Lasix 40 mg daily Continue to closely monitor blood pressure, urine output, renal function and electrolytes while on diuretics.  GERD Continue Protonix   DVT prophylaxis:  Subcu Lovenox daily Code Status: Full Code.  Family Communication:  None at bedside. Disposition Plan:  Home likely tomorrow 05/18/2019 if hemodynamically stable.     Objective: Vitals:   05/16/19 0734 05/16/19 1529 05/16/19 2031 05/17/19 0822  BP: 119/73 (!) 112/97 (!) 124/59 123/75  Pulse: 93 (!) 103 96 89  Resp: 19 18 16 19   Temp: 98.3 F (36.8 C) 98.5 F (36.9 C) 98.2 F (36.8 C) 98.8 F (37.1 C)  TempSrc: Oral Oral Oral Oral  SpO2: 97% 99% 100% 98%  Weight:      Height:        Intake/Output Summary (Last 24 hours) at 05/17/2019 1132 Last data filed at 05/17/2019 0900 Gross per 24 hour  Intake 960 ml  Output -  Net 960 ml   Filed Weights   05/11/19 2054 05/12/19 0224  Weight: 104.3 kg 104.4 kg    Exam:  . General: 68 y.o. year-old female obese in no acute distress.  Alert and oriented x3.   . Cardiovascular: Regular rate and rhythm no rubs or gallops.  No JVD or thyromegaly noted. Marland Kitchen Respiratory: Clear to auscultation no wheezes no rales.  Poor inspiratory effort.   . Abdomen: Soft  obese nontender nondistended normal bowel sounds present.   . Musculoskeletal: Right lower extremity edema, erythema, and tenderness improving . Psychiatry: Mood is appropriate for condition and setting.   Data Reviewed: CBC: Recent Labs  Lab 05/11/19 2114 05/12/19 0421 05/13/19 0344 05/14/19 1041 05/15/19 0246   WBC 13.6* 11.8* 11.6* 13.2* 12.0*  NEUTROABS 12.4*  --   --   --   --   HGB 11.0* 11.1* 9.7* 10.2* 9.3*  HCT 33.7* 34.3* 30.0* 30.6* 27.6*  MCV 81.2 81.3 81.3 79.5* 78.6*  PLT 188 153 158 189 [id number]   Basic Metabolic Panel: Recent Labs  Lab 05/11/19 2114 05/12/19 0421 05/13/19 0344 05/14/19 1041 05/15/19 0246  NA 133* 136 135 137 138  K 3.9 3.8 3.9 3.6 3.5  CL 98 102 103 103 104  CO2 22 23 23 24 25   GLUCOSE 230* 209* 256* 246* 194*  BUN 11 13 8  <5* 6*  CREATININE 1.23* 1.14* 1.00 0.90 0.96  CALCIUM 9.9 9.5 9.1 9.2 9.2   GFR: Estimated Creatinine Clearance: 69.7 mL/min (by C-G formula based on SCr of 0.96 mg/dL). Liver Function Tests: Recent Labs  Lab 05/11/19 2114  AST 30  ALT 35  ALKPHOS 103  BILITOT 0.7  PROT 7.0  ALBUMIN 3.2*   No results for input(s): LIPASE, AMYLASE in the last 168 hours. No results for input(s): AMMONIA in the last 168 hours. Coagulation Profile: No results for input(s): INR, PROTIME in the last 168 hours. Cardiac Enzymes: No results for input(s): CKTOTAL, CKMB, CKMBINDEX, TROPONINI in the last 168 hours. BNP (last 3 results) No results for input(s): PROBNP in the last 8760 hours. HbA1C: No results for input(s): HGBA1C in the last 72 hours. CBG: Recent Labs  Lab 05/16/19 0630 05/16/19 1251 05/16/19 1623 05/16/19 2119 05/17/19 0632  GLUCAP 219* 234* 289* 171* 162*   Lipid Profile: No results for input(s): CHOL, HDL, LDLCALC, TRIG, CHOLHDL, LDLDIRECT in the last 72 hours. Thyroid Function Tests: No results for input(s): TSH, T4TOTAL, FREET4, T3FREE, THYROIDAB in the last 72 hours. Anemia Panel: No results for input(s): VITAMINB12, FOLATE, FERRITIN, TIBC, IRON, RETICCTPCT in the last 72 hours. Urine analysis: No results found for: COLORURINE, APPEARANCEUR, LABSPEC, PHURINE, GLUCOSEU, HGBUR, BILIRUBINUR, KETONESUR, PROTEINUR, UROBILINOGEN, NITRITE, LEUKOCYTESUR Sepsis Labs: @LABRCNTIP (procalcitonin:4,lacticidven:4)  ) Recent  Results (from the past 240 hour(s))  SARS CORONAVIRUS 2 (TAT 6-12 HRS) Nasal Swab Aptima Multi Swab     Status: None   Collection Time: 05/12/19 12:44 AM   Specimen: Aptima Multi Swab; Nasal Swab  Result Value Ref Range Status   SARS Coronavirus 2 NEGATIVE NEGATIVE Final    Comment: (NOTE) SARS-CoV-2 target nucleic acids are NOT DETECTED. The SARS-CoV-2 RNA is generally detectable in upper and lower respiratory specimens during the acute phase of infection. Negative results do not preclude SARS-CoV-2 infection, do not rule out co-infections with other pathogens, and should not be used as the sole basis for treatment or other patient management decisions. Negative results must be combined with clinical observations, patient history, and epidemiological information. The expected result is Negative. Fact Sheet for Patients: SugarRoll.be Fact Sheet for Healthcare Providers: https://www.woods-mathews.com/ This test is not yet approved or cleared by the [place] FDA and  has been authorized for detection and/or diagnosis of SARS-CoV-2 by FDA under an Emergency Use Authorization (EUA). This EUA will remain  in effect (meaning this test can be used) for the duration of the COVID-19 declaration under Section 56 4(b)(1) of the Act, 21 U.S.C. section 360bbb-3(b)(1), unless  the authorization is terminated or revoked sooner. Performed at [facility], [place] [address], [place], [place] [address]       Studies: No results found.  Scheduled Meds: . aspirin EC  81 mg Oral Daily  . enoxaparin (LOVENOX) injection  50 mg Subcutaneous Daily  . furosemide  40 mg Intravenous Daily  . insulin aspart  0-15 Units Subcutaneous TID WC  . insulin aspart  0-5 Units Subcutaneous QHS  . insulin glargine  10 Units Subcutaneous Daily  . latanoprost  1 drop Both Eyes QHS  . loratadine  10 mg Oral Daily  . pantoprazole  40 mg Oral Daily  . potassium  chloride SA  20 mEq Oral Daily    Continuous Infusions: .  ceFAZolin (ANCEF) IV 2 g (05/17/19 0629)     LOS: 4 days     Kayleen Memos, MD [facility] Pager [phone]  If 7PM-7AM, please contact night-coverage [email or web address] Password [email or web address] 05/17/2019, 11:32 AM

## 2019-05-17 NOTE — TOC Initial Note (Addendum)
Transition of Care [facility]) - Initial/Assessment Note    Patient Details  Name: Jessica Yates MRN: [id number] Date of Birth: 1951-04-19  Transition of [facility]) CM/SW Contact:    Marilu Favre, RN Phone Number: 05/17/2019, 3:17 PM  Clinical Narrative:        Patient has decided on [facility] .  Referral given to and accepted by [facility]           Confirmed face sheet information with patient. From home with her brother. Already has walker and cane. Requesting 3 in1 . Ordered with Zack with [employer].   Discussed home health PT,RN,aide, provided choice patient would like some more to decide on agency , she requested NCM to call her today at 1615.  Expected Discharge Plan: [place] Barriers to Discharge: Continued Medical Work up   Patient Goals and CMS Choice   CMS Medicare.gov Compare Post Acute Care list provided to:: Patient Choice offered to / list presented to : Patient  Expected Discharge Plan and Services Expected Discharge Plan: [place] Choice: [place] arrangements for the past 2 months: Single Family Home                 DME Arranged: 3-N-1 DME Agency: [facility] Date DME Agency Contacted: 05/17/19 Time DME Agency Contacted: [phone] Representative spoke with at DME Agency: [place] Arranged: RN, PT, Nurse's Aide          Prior Living Arrangements/Services Living arrangements for the past 2 months: [place] with:: Siblings Patient language and need for interpreter reviewed:: Yes Do you feel safe going back to the place where you live?: Yes      Need for Family Participation in Patient Care: Yes (Comment) Care giver support system in place?: Yes (comment) Current home services: DME Criminal Activity/Legal Involvement Pertinent to Current Situation/Hospitalization: No - Comment as needed  Activities of Daily Living Home Assistive Devices/Equipment: Cane (specify quad or  straight) ADL Screening (condition at time of admission) Patient's cognitive ability adequate to safely complete daily activities?: Yes Is the patient deaf or have difficulty hearing?: No Does the patient have difficulty seeing, even when wearing glasses/contacts?: No Does the patient have difficulty concentrating, remembering, or making decisions?: No Patient able to express need for assistance with ADLs?: No Does the patient have difficulty dressing or bathing?: No Independently performs ADLs?: Yes (appropriate for developmental age) Does the patient have difficulty walking or climbing stairs?: Yes Weakness of Legs: Both Weakness of Arms/Hands: None  Permission Sought/Granted   Permission granted to share information with : No              Emotional Assessment Appearance:: Appears stated age Attitude/Demeanor/Rapport: Engaged Affect (typically observed): Accepting Orientation: : Oriented to Self, Oriented to Place, Oriented to  Time, Oriented to Situation Alcohol / Substance Use: Not Applicable Psych Involvement: No (comment)  Admission diagnosis:  Cellulitis of right lower extremity [L03.115] Patient Active Problem List   Diagnosis Date Noted  . Cellulitis 05/13/2019  . Cellulitis of right lower extremity 05/12/2019  . CKD (chronic kidney disease) stage 3, GFR 30-59 ml/min (HCC) 05/12/2019  . Normochromic normocytic anemia 05/12/2019  . Cellulitis, leg 05/12/2019  . Cellulitis and abscess of left leg 07/22/2018  . Cough 05/07/2016  . Upper airway cough syndrome 05/07/2016  . Sebaceous cyst of labia 04/19/2016  . OAB (overactive bladder) 04/19/2016  . Other noninfectious disorders of lymphatic  channels 05/03/2012  . Lymphedema 05/03/2012  . CHF (congestive heart failure) ([place]) 04/16/2011  . SICKLE-CELL TRAIT 08/01/2007  . UNSPECIFIED GLAUCOMA 08/01/2007  . DVT 08/01/2007  . VENOUS INSUFFICIENCY 08/01/2007  . ALLERGIC RHINITIS 08/01/2007  . Extrinsic asthma  08/01/2007   PCP:  Sonia Side., FNP Pharmacy:   [id number] - [address], [place] [address] Phone: [phone] Fax: [place], [place] - [address] [place] [address] Phone: [phone] Fax: [phone]     Social Determinants of Health (SDOH) Interventions    Readmission Risk Interventions No flowsheet data found.

## 2019-05-18 LAB — CBC
HCT: 27.5 % — ABNORMAL LOW (ref 36.0–46.0)
Hemoglobin: 9.2 g/dL — ABNORMAL LOW (ref 12.0–15.0)
MCH: 26.3 pg (ref 26.0–34.0)
MCHC: 33.5 g/dL (ref 30.0–36.0)
MCV: 78.6 fL — ABNORMAL LOW (ref 80.0–100.0)
Platelets: 301 10*3/uL (ref 150–400)
RBC: 3.5 MIL/uL — ABNORMAL LOW (ref 3.87–5.11)
RDW: 15.8 % — ABNORMAL HIGH (ref 11.5–15.5)
WBC: 12.5 10*3/uL — ABNORMAL HIGH (ref 4.0–10.5)
nRBC: 0.2 % (ref 0.0–0.2)

## 2019-05-18 LAB — BASIC METABOLIC PANEL
Anion gap: 9 (ref 5–15)
BUN: 9 mg/dL (ref 8–23)
CO2: 26 mmol/L (ref 22–32)
Calcium: 9.4 mg/dL (ref 8.9–10.3)
Chloride: 101 mmol/L (ref 98–111)
Creatinine, Ser: 0.82 mg/dL (ref 0.44–1.00)
GFR calc Af Amer: >60 mL/min (ref >60)
GFR calc non Af Amer: >60 mL/min (ref >60)
Glucose, Bld: 186 mg/dL — ABNORMAL HIGH (ref 70–99)
Potassium: 4 mmol/L (ref 3.5–5.1)
Sodium: 136 mmol/L (ref 135–145)

## 2019-05-18 LAB — GLUCOSE, CAPILLARY
Glucose-Capillary: 184 mg/dL — ABNORMAL HIGH (ref 70–99)
Glucose-Capillary: 217 mg/dL — ABNORMAL HIGH (ref 70–99)
Glucose-Capillary: 115 mg/dL — ABNORMAL HIGH (ref 70–99)
Glucose-Capillary: 169 mg/dL — ABNORMAL HIGH (ref 70–99)

## 2019-05-18 MED ORDER — INSULIN GLARGINE 100 UNIT/ML ~~LOC~~ SOLN
15.0000 [IU] | Freq: Every day | SUBCUTANEOUS | Status: DC
  Administered 2019-05-18 – 2019-05-20 (×3): 15 [IU] via SUBCUTANEOUS
  Filled 2019-05-18 (×3): qty 0.15

## 2019-05-18 NOTE — Progress Notes (Signed)
PROGRESS NOTE  DANIYAH CRAGLE [id number] DOB: 08/31/1951 DOA: 05/11/2019 PCP: Sonia Side., FNP  HPI/Recap of past 24 hours: Jessica Yates is an 68 y.o. female withhistory of chronic lymphedema, diastolic dysfunction previous history of DVT of the left lower extremity anemia presents to the ER because of ongoing right lower extremity pain and swelling with redness last 2 days. Cellulitis improving slowly.   05/18/19: Patient was seen and examined at her bedside this morning she reports some burning, tingling pain in her right lower extremity.  States she can hardly bear any weight on it.  Also reports significant bilateral lower extremity weakness.  PT evaluated and recommended home health PT.  Lab studies remarkable for persistent leukocytosis.  Exam concerning for persistent right lower extremity edema, erythema, warmth and tenderness with mild palpation.  Assessment/Plan: Principal Problem:   Cellulitis of right lower extremity Active Problems:   Lymphedema   CKD (chronic kidney disease) stage 3, GFR 30-59 ml/min (HCC)   Normochromic normocytic anemia   Cellulitis, leg   Cellulitis   Sepsis secondary to right lower extremity cellulitis in the setting of lymphedema Presented with fever with T-max of 100.7, leukocytosis, tachycardia, right lower extremity edema, tenderness to touch, erythema and warmth Started on Ancef on admission Blood cultures x2 peripherally no growth in 2 days Right lower extremity cellulitis is slow to improve Persistent leukocytosis with WBC 5.5 on 05/18/2019 from 13.1 on 05/17/2019. Elevated procalcitonin 0.53 T-max 99.3 this morning Still requiring IV antibiotics due to slow to improve cellulitis Continue to monitor fever curve and WBC MRSA screening negative Continue IV antibiotic Ancef Start gabapentin 100 mg 3 times daily for persistent burning pain in the right lower extremity  Worsening right lower extremity edema in the setting of  lymphedema Right lower extremity Doppler ultrasound negative for DVT No compression stockings indicated at this time due to active infection, cellulitis. Currently on IV Lasix 40 mg daily with slow improvement of right lower extremity edema Continue IV Lasix 40 mg daily  Uncontrolled type 2 diabetes with hyperglycemia Hemoglobin A1c 6.9 on 05/11/2019 Worsening hyperglycemia Increase Lantus to 15 units daily Increase insulin sliding scale to resistant Continue to closely monitor CBGs and avoid hyperglycemia in the setting of cellulitis  Ambulatory dysfunction/physical debility PT assessed and recommended home health PT Continue fall precautions  Bilateral lower extremity lymphedema Resume compression stockings when her cellulitis has resolved  Chronic diastolic CHF Last 2D echo LVEF 55 to 60% with grade 1 diastolic dysfunction No acute issues Continue strict I's and O's and daily weight Net I&O -6.1 L since admission Continue diuretics, IV Lasix 40 mg daily Continue to closely monitor blood pressure, urine output, renal function and electrolytes while on diuretics.  GERD Continue Protonix    Risks: Unable to discharge the patient at this time due to concern for persistent right lower extremity edema, persistent erythema, warmth and tenderness on palpation.  Patient is unable to bear weight in her right lower extremity.  Continues to have low-grade fevers and leukocytosis. Requires IV antibiotics at this time.    DVT prophylaxis:  Subcu Lovenox daily Code Status: Full Code.  Family Communication:  None at bedside. Disposition Plan:  Home in 1 to 2 days pending clinical improvement.     Objective: Vitals:   05/17/19 1624 05/17/19 2042 05/18/19 0236 05/18/19 0812  BP: 102/67 114/67 119/67 109/66  Pulse: 98 98 99 88  Resp: 18 16 16 16   Temp: 99.7 F (37.6 C) 99.1  F (37.3 C) 99.3 F (37.4 C) 98.9 F (37.2 C)  TempSrc: Oral Oral Oral Oral  SpO2: 98% 99% 99% 98%   Weight:      Height:        Intake/Output Summary (Last 24 hours) at 05/18/2019 1252 Last data filed at 05/18/2019 0900 Gross per 24 hour  Intake 720 ml  Output -  Net 720 ml   Filed Weights   05/11/19 2054 05/12/19 0224  Weight: 104.3 kg 104.4 kg    Exam:  . General: 68 y.o. year-old female obese appears uncomfortable due to right lower extremity pain.  Alert and oriented x3. . Cardiovascular: Regular rate and rhythm no rubs or gallops.  No significant thyromegaly noted. Marland Kitchen Respiratory: Clear to auscultation no wheezes or rales.  Poor inspiratory effort.   . Abdomen: Obese nontender nondistended normal bowel sounds present.  . Musculoskeletal: Right lower extremity severely edematous with persistent erythema, warmth and tenderness on palpation.  Pulses present in all 4 extremities. Marland Kitchen Psychiatry: Mood is appropriate for condition and setting   Data Reviewed: CBC: Recent Labs  Lab 05/11/19 2114  05/13/19 0344 05/14/19 1041 05/15/19 0246 05/17/19 1139 05/18/19 0626  WBC 13.6*   < > 11.6* 13.2* 12.0* 13.1* 12.5*  NEUTROABS 12.4*  --   --   --   --  10.0*  --   HGB 11.0*   < > 9.7* 10.2* 9.3* 10.9* 9.2*  HCT 33.7*   < > 30.0* 30.6* 27.6* 33.1* 27.5*  MCV 81.2   < > 81.3 79.5* 78.6* 78.6* 78.6*  PLT 188   < > 158 189 206 326 301   < > = values in this interval not displayed.   Basic Metabolic Panel: Recent Labs  Lab 05/13/19 0344 05/14/19 1041 05/15/19 0246 05/17/19 1139 05/18/19 0626  NA 135 137 138 139 136  K 3.9 3.6 3.5 3.5 4.0  CL 103 103 104 101 101  CO2 23 24 25 26 26   GLUCOSE 256* 246* 194* 220* 186*  BUN 8 <5* 6* 7* 9  CREATININE 1.00 0.90 0.96 0.95 0.82  CALCIUM 9.1 9.2 9.2 9.9 9.4   GFR: Estimated Creatinine Clearance: 81.6 mL/min (by C-G formula based on SCr of 0.82 mg/dL). Liver Function Tests: Recent Labs  Lab 05/11/19 2114  AST 30  ALT 35  ALKPHOS 103  BILITOT 0.7  PROT 7.0  ALBUMIN 3.2*   No results for input(s): LIPASE, AMYLASE in the  last 168 hours. No results for input(s): AMMONIA in the last 168 hours. Coagulation Profile: No results for input(s): INR, PROTIME in the last 168 hours. Cardiac Enzymes: No results for input(s): CKTOTAL, CKMB, CKMBINDEX, TROPONINI in the last 168 hours. BNP (last 3 results) No results for input(s): PROBNP in the last 8760 hours. HbA1C: No results for input(s): HGBA1C in the last 72 hours. CBG: Recent Labs  Lab 05/17/19 1255 05/17/19 1627 05/17/19 2114 05/18/19 0633 05/18/19 1233  GLUCAP 183* 118* 194* 184* 217*   Lipid Profile: No results for input(s): CHOL, HDL, LDLCALC, TRIG, CHOLHDL, LDLDIRECT in the last 72 hours. Thyroid Function Tests: No results for input(s): TSH, T4TOTAL, FREET4, T3FREE, THYROIDAB in the last 72 hours. Anemia Panel: No results for input(s): VITAMINB12, FOLATE, FERRITIN, TIBC, IRON, RETICCTPCT in the last 72 hours. Urine analysis: No results found for: COLORURINE, APPEARANCEUR, LABSPEC, PHURINE, GLUCOSEU, HGBUR, BILIRUBINUR, KETONESUR, PROTEINUR, UROBILINOGEN, NITRITE, LEUKOCYTESUR Sepsis Labs: @LABRCNTIP (procalcitonin:4,lacticidven:4)  ) Recent Results (from the past 240 hour(s))  SARS CORONAVIRUS 2 (TAT 6-12 HRS)  Nasal Swab Aptima Multi Swab     Status: None   Collection Time: 05/12/19 12:44 AM   Specimen: Aptima Multi Swab; Nasal Swab  Result Value Ref Range Status   SARS Coronavirus 2 NEGATIVE NEGATIVE Final    Comment: (NOTE) SARS-CoV-2 target nucleic acids are NOT DETECTED. The SARS-CoV-2 RNA is generally detectable in upper and lower respiratory specimens during the acute phase of infection. Negative results do not preclude SARS-CoV-2 infection, do not rule out co-infections with other pathogens, and should not be used as the sole basis for treatment or other patient management decisions. Negative results must be combined with clinical observations, patient history, and epidemiological information. The expected result is Negative. Fact  Sheet for Patients: SugarRoll.be Fact Sheet for Healthcare Providers: https://www.woods-mathews.com/ This test is not yet approved or cleared by the [place] FDA and  has been authorized for detection and/or diagnosis of SARS-CoV-2 by FDA under an Emergency Use Authorization (EUA). This EUA will remain  in effect (meaning this test can be used) for the duration of the COVID-19 declaration under Section 56 4(b)(1) of the Act, 21 U.S.C. section 360bbb-3(b)(1), unless the authorization is terminated or revoked sooner. Performed at [facility], [place] [address] [place], [place] [address]   Culture, blood (routine x 2)     Status: None (Preliminary result)   Collection Time: 05/16/19  4:00 PM   Specimen: BLOOD RIGHT ARM  Result Value Ref Range Status   Specimen Description BLOOD RIGHT ARM  Final   Special Requests   Final    BOTTLES DRAWN AEROBIC ONLY Blood Culture adequate volume   Culture   Final    NO GROWTH < 24 HOURS Performed at [facility], [place] [address] [place], [place] [address]    Report Status PENDING  Incomplete  Culture, blood (routine x 2)     Status: None (Preliminary result)   Collection Time: 05/16/19  4:05 PM   Specimen: BLOOD RIGHT ARM  Result Value Ref Range Status   Specimen Description BLOOD RIGHT ARM  Final   Special Requests   Final    BOTTLES DRAWN AEROBIC ONLY Blood Culture adequate volume   Culture   Final    NO GROWTH < 24 HOURS Performed at [facility], [place] [address] [place], [place] [address]    Report Status PENDING  Incomplete  MRSA PCR Screening     Status: None   Collection Time: 05/17/19 12:05 PM   Specimen: Nasal Mucosa; Nasopharyngeal  Result Value Ref Range Status   MRSA by PCR NEGATIVE NEGATIVE Final    Comment:        The GeneXpert MRSA Assay (FDA approved for NASAL specimens only), is one component of a comprehensive MRSA colonization surveillance program. It is  not intended to diagnose MRSA infection nor to guide or monitor treatment for MRSA infections. Performed at [facility], [place] [address] [place], [place] [address]       Studies: No results found.  Scheduled Meds: . aspirin EC  81 mg Oral Daily  . enoxaparin (LOVENOX) injection  50 mg Subcutaneous Daily  . furosemide  40 mg Intravenous Daily  . insulin aspart  0-15 Units Subcutaneous TID WC  . insulin aspart  0-5 Units Subcutaneous QHS  . insulin glargine  15 Units Subcutaneous Daily  . latanoprost  1 drop Both Eyes QHS  . loratadine  10 mg Oral Daily  . pantoprazole  40 mg Oral Daily  .  potassium chloride SA  20 mEq Oral Daily    Continuous Infusions: .  ceFAZolin (ANCEF) IV 2 g (05/18/19 0542)     LOS: 5 days     Kayleen Memos, MD [facility] Pager [phone]  If 7PM-7AM, please contact night-coverage [email or web address] Password [email or web address] 05/18/2019, 12:52 PM

## 2019-05-18 NOTE — Evaluation (Signed)
Occupational Therapy Evaluation Patient Details Name: Jessica Yates MRN: [id number] DOB: 12-01-1950 Today's Date: 05/18/2019    History of Present Illness Pt is a 68 y.o. female admitted 05/11/19 with ongoing RLE pain and swelling. Worked up for sepsis secondary to RLE cellulitis in the setting of lymphedema. PMH includes chronic lymphedema, DVT, glaucoma, depression.   Clinical Impression   This 68 yo female admitted with above presents to acute OT with decreased balance thus affecting her safety and independence with basic ADLs. She will benefit from acute OT with follow up [place].    Follow Up Recommendations  Home health OT;Supervision - Intermittent    Equipment Recommendations  3 in 1 bedside commode       Precautions / Restrictions Precautions Precautions: Fall Precaution Comments: Urine incontinence (wears depends at home) Restrictions Weight Bearing Restrictions: No      Mobility Bed Mobility               General bed mobility comments: Received sitting in recliner  Transfers Overall transfer level: Needs assistance Equipment used: Rolling walker (2 wheeled) Transfers: Sit to/from Stand Sit to Stand: Min guard              Balance Overall balance assessment: Needs assistance Sitting-balance support: No upper extremity supported;Feet supported Sitting balance-Leahy Scale: Good     Standing balance support: No upper extremity supported;During functional activity Standing balance-Leahy Scale: Fair                             ADL either performed or assessed with clinical judgement   ADL Overall ADL's : Needs assistance/impaired Eating/Feeding: Independent;Sitting   Grooming: Min guard;Standing   Upper Body Bathing: Set up;Sitting   Lower Body Bathing: Min guard;Sit to/from stand   Upper Body Dressing : Set up;Sitting   Lower Body Dressing: Min guard;Sit to/from stand   Toilet Transfer: Min guard;Ambulation;RW   Toileting-  Water quality scientist and Hygiene: Min guard;Sit to/from stand               Vision Baseline Vision/History: Wears glasses Wears Glasses: Reading only Patient Visual Report: No change from baseline              Pertinent Vitals/Pain Pain Score: 2  Pain Location: BLEs (R>L) Pain Descriptors / Indicators: Sore;Heaviness Pain Intervention(s): Monitored during session;Repositioned     Hand Dominance Right   Extremity/Trunk Assessment Upper Extremity Assessment Upper Extremity Assessment: Overall WFL for tasks assessed           Communication Communication Communication: No difficulties   Cognition Arousal/Alertness: Awake/alert Behavior During Therapy: WFL for tasks assessed/performed                                   General Comments: WFL for simple tasks, although tangential with speech and repetitive. Did want to talk to dietician about newly being diagnosed with diabetes (per RN she has already spoken to someone over phone)              Home Living Family/patient expects to be discharged to:: Private residence Living Arrangements: Other relatives(brother) Available Help at Discharge: Family;Available PRN/intermittently Type of Home: House Home Access: Stairs to enter [employer] of Steps: 1   Home Layout: One level     Bathroom Shower/Tub: Teacher, early years/pre: Standard     Home Equipment: Cane - single  point;Walker - 4 wheels   Additional Comments: Lives with brother      Prior Functioning/Environment Level of Independence: Independent with assistive device(s)        Comments: Mod indep with SPC or rollator. Sister drives        OT Problem List: Decreased strength;Impaired balance (sitting and/or standing);Obesity;Pain      OT Treatment/Interventions: Self-care/ADL training;DME and/or AE instruction;Patient/family education;Balance training    OT Goals(Current goals can be found in the care plan  section) Acute Rehab OT Goals Patient Stated Goal: to go home and be able to take care of myself, especially knowing what I need to eat OT Goal Formulation: With patient Time For Goal Achievement: 06/01/19 Potential to Achieve Goals: Good  OT Frequency: Min 2X/week              AM-PAC OT "6 Clicks" Daily Activity     Outcome Measure Help from another person eating meals?: None Help from another person taking care of personal grooming?: A Little Help from another person toileting, which includes using toliet, bedpan, or urinal?: A Little Help from another person bathing (including washing, rinsing, drying)?: A Little Help from another person to put on and taking off regular upper body clothing?: A Little Help from another person to put on and taking off regular lower body clothing?: A Little 6 Click Score: 19   End of Session Equipment Utilized During Treatment: Gait belt;Rolling walker  Activity Tolerance: Patient tolerated treatment well Patient left: in chair;with call bell/phone within reach(no chair alarm when I received pt in recliner)  OT Visit Diagnosis: Unsteadiness on feet (R26.81);Muscle weakness (generalized) (M62.81);Pain Pain - Right/Left: (both (RLE>LLE)) Pain - part of body: Leg                Time: 1451-1537 OT Time Calculation (min): 46 min Charges:  OT General Charges $OT Visit: 1 Visit OT Evaluation $OT Eval Moderate Complexity: 1 Mod OT Treatments $Self Care/Home Management : 23-37 mins  [address], OTR/L Acute [employer] Pager [phone] Office [phone]     Almon Register 05/18/2019, 3:47 PM

## 2019-05-18 NOTE — Plan of Care (Signed)
  Problem: Pain Managment: Goal: General experience of comfort will improve Outcome: Progressing   

## 2019-05-19 LAB — CBC
HCT: 28.2 % — ABNORMAL LOW (ref 36.0–46.0)
Hemoglobin: 9.4 g/dL — ABNORMAL LOW (ref 12.0–15.0)
MCH: 26.4 pg (ref 26.0–34.0)
MCHC: 33.3 g/dL (ref 30.0–36.0)
MCV: 79.2 fL — ABNORMAL LOW (ref 80.0–100.0)
Platelets: 330 10*3/uL (ref 150–400)
RBC: 3.56 MIL/uL — ABNORMAL LOW (ref 3.87–5.11)
RDW: 15.5 % (ref 11.5–15.5)
WBC: 14.4 10*3/uL — ABNORMAL HIGH (ref 4.0–10.5)
nRBC: 0 % (ref 0.0–0.2)

## 2019-05-19 LAB — CREATININE, SERUM
Creatinine, Ser: 0.81 mg/dL (ref 0.44–1.00)
GFR calc Af Amer: >60 mL/min (ref >60)
GFR calc non Af Amer: >60 mL/min (ref >60)

## 2019-05-19 LAB — GLUCOSE, CAPILLARY
Glucose-Capillary: 156 mg/dL — ABNORMAL HIGH (ref 70–99)
Glucose-Capillary: 197 mg/dL — ABNORMAL HIGH (ref 70–99)
Glucose-Capillary: 134 mg/dL — ABNORMAL HIGH (ref 70–99)
Glucose-Capillary: 147 mg/dL — ABNORMAL HIGH (ref 70–99)

## 2019-05-19 MED ORDER — FUROSEMIDE 40 MG PO TABS
40.0000 mg | ORAL_TABLET | Freq: Every day | ORAL | Status: DC
  Administered 2019-05-19 – 2019-05-20 (×2): 40 mg via ORAL
  Filled 2019-05-19 (×2): qty 1

## 2019-05-19 MED ORDER — GABAPENTIN 100 MG PO CAPS
100.0000 mg | ORAL_CAPSULE | Freq: Two times a day (BID) | ORAL | Status: DC
  Administered 2019-05-19 – 2019-05-20 (×3): 100 mg via ORAL
  Filled 2019-05-19 (×3): qty 1

## 2019-05-19 MED ORDER — CEPHALEXIN 500 MG PO CAPS
500.0000 mg | ORAL_CAPSULE | Freq: Three times a day (TID) | ORAL | Status: DC
  Administered 2019-05-19 – 2019-05-20 (×4): 500 mg via ORAL
  Filled 2019-05-19 (×4): qty 1

## 2019-05-19 NOTE — Plan of Care (Signed)
  Problem: Pain Managment: Goal: General experience of comfort will improve 05/19/2019 2015 by Camillia Herter, RN Outcome: Progressing 05/19/2019 2015 by Camillia Herter, RN Outcome: Progressing

## 2019-05-19 NOTE — Progress Notes (Signed)
PROGRESS NOTE  Jessica Yates [id number] DOB: April 20, 1951 DOA: 05/11/2019 PCP: Sonia Side., FNP  HPI/Recap of past 24 hours: Jessica Yates is an 68 y.o. female withhistory of chronic lymphedema, diastolic dysfunction previous history of DVT of the left lower extremity anemia presents to the ER because of ongoing right lower extremity pain and swelling with redness last 2 days. Cellulitis improving slowly.   05/18/19: Patient was seen and examined at her bedside this morning she reports some burning, tingling pain in her right lower extremity.  States she can hardly bear any weight on it.  Also reports significant bilateral lower extremity weakness.  PT evaluated and recommended home health PT.  Lab studies remarkable for persistent leukocytosis.  Exam concerning for persistent right lower extremity edema, erythema, warmth and tenderness with mild palpation.  05/19/19: Patient seen and examined at bedside.  Her right lower extremity cellulitis appears to be resolving.  We will switch to oral Keflex 500 mg 3 times daily.  No cardiopulmonary or GI symptoms.  Assessment/Plan: Principal Problem:   Cellulitis of right lower extremity Active Problems:   Lymphedema   CKD (chronic kidney disease) stage 3, GFR 30-59 ml/min (HCC)   Normochromic normocytic anemia   Cellulitis, leg   Cellulitis   Resolving sepsis secondary to right lower extremity cellulitis in the setting of lymphedema Presented with fever with T-max of 100.7, leukocytosis, tachycardia, right lower extremity edema, tenderness to touch, erythema and warmth Started on Ancef on admission Blood cultures x2 peripherally no growth in 2 days Right lower extremity cellulitis is slow to improve Persistent leukocytosis with WBC 5.5 on 05/18/2019 from 13.1 on 05/17/2019. Elevated procalcitonin 0.53 Lost IV site on 05/19/2019 MRSA screening negative Completed IV Ancef Started oral Keflex 500 mg 3 times daily Continue gabapentin  100 mg 3 times daily for persistent burning pain in the right lower extremity  Improving right lower extremity edema in the setting of lymphedema Right lower extremity Doppler ultrasound negative for DVT No compression stockings indicated at this time due to active infection, cellulitis. Last IV started, switch to home dose oral Lasix  Uncontrolled type 2 diabetes with hyperglycemia Hemoglobin A1c 6.9 on 05/11/2019 Worsening hyperglycemia Increase Lantus to 15 units daily Increase insulin sliding scale to resistant Continue to closely monitor CBGs and avoid hyperglycemia in the setting of cellulitis  Ambulatory dysfunction/physical debility PT assessed and recommended home health PT Continue fall precautions Continue physical therapy Ambulate patient with every shift  Bilateral lower extremity lymphedema Resume compression stockings when her cellulitis has resolved  Chronic diastolic CHF Last 2D echo LVEF 55 to 60% with grade 1 diastolic dysfunction No acute issues Continue strict I's and O's and daily weight Net I&O -4.9 L since admission  GERD Continue Protonix    DVT prophylaxis:  Subcu Lovenox daily Code Status: Full Code.  Family Communication:  None at bedside. Disposition Plan:  Home possibly tomorrow 05/20/2019 if no issues overnight.     Objective: Vitals:   05/18/19 1424 05/18/19 2028 05/19/19 0351 05/19/19 0822  BP: 118/73 116/63 125/68 112/70  Pulse: 100 (!) 103 98 92  Resp: 16 16 16 18   Temp: 99.1 F (37.3 C) 98.3 F (36.8 C) 98.6 F (37 C) 99.4 F (37.4 C)  TempSrc: Oral Oral Oral Oral  SpO2: 97% 99% 99% 96%  Weight:      Height:        Intake/Output Summary (Last 24 hours) at 05/19/2019 1259 Last data filed at 05/19/2019 1045  Gross per 24 hour  Intake 720 ml  Output -  Net 720 ml   Filed Weights   05/11/19 2054 05/12/19 0224  Weight: 104.3 kg 104.4 kg    Exam:  . General: 67 y.o. year-old female obese in no acute distress.  Alert and  oriented x3.   . Cardiovascular: Regular rate and rhythm no rubs or gallops.  No JVD or thyromegaly noted.   Marland Kitchen Respiratory: Auscultation no wheezes or rales.  Poor inspiratory effort.  . Abdomen: Obese nontender nondistended normal bowel sounds present..  . Musculoskeletal: Right lower extremity erythema, warmth and edema improving.  2 out of 4 pulses in all 4 extremities.   Marland Kitchen Psychiatry: Mood is appropriate for condition and setting.   Data Reviewed: CBC: Recent Labs  Lab 05/14/19 1041 05/15/19 0246 05/17/19 1139 05/18/19 0626 05/19/19 0708  WBC 13.2* 12.0* 13.1* 12.5* 14.4*  NEUTROABS  --   --  10.0*  --   --   HGB 10.2* 9.3* 10.9* 9.2* 9.4*  HCT 30.6* 27.6* 33.1* 27.5* 28.2*  MCV 79.5* 78.6* 78.6* 78.6* 79.2*  PLT 189 206 326 301 [id number]   Basic Metabolic Panel: Recent Labs  Lab 05/13/19 0344 05/14/19 1041 05/15/19 0246 05/17/19 1139 05/18/19 0626 05/19/19 0457  NA 135 137 138 139 136  --   K 3.9 3.6 3.5 3.5 4.0  --   CL 103 103 104 101 101  --   CO2 23 24 25 26 26   --   GLUCOSE 256* 246* 194* 220* 186*  --   BUN 8 <5* 6* 7* 9  --   CREATININE 1.00 0.90 0.96 0.95 0.82 0.81  CALCIUM 9.1 9.2 9.2 9.9 9.4  --    GFR: Estimated Creatinine Clearance: 82.6 mL/min (by C-G formula based on SCr of 0.81 mg/dL). Liver Function Tests: No results for input(s): AST, ALT, ALKPHOS, BILITOT, PROT, ALBUMIN in the last 168 hours. No results for input(s): LIPASE, AMYLASE in the last 168 hours. No results for input(s): AMMONIA in the last 168 hours. Coagulation Profile: No results for input(s): INR, PROTIME in the last 168 hours. Cardiac Enzymes: No results for input(s): CKTOTAL, CKMB, CKMBINDEX, TROPONINI in the last 168 hours. BNP (last 3 results) No results for input(s): PROBNP in the last 8760 hours. HbA1C: No results for input(s): HGBA1C in the last 72 hours. CBG: Recent Labs  Lab 05/18/19 0633 05/18/19 1233 05/18/19 1642 05/18/19 2054 05/19/19 0632  GLUCAP 184* 217*  115* 169* 156*   Lipid Profile: No results for input(s): CHOL, HDL, LDLCALC, TRIG, CHOLHDL, LDLDIRECT in the last 72 hours. Thyroid Function Tests: No results for input(s): TSH, T4TOTAL, FREET4, T3FREE, THYROIDAB in the last 72 hours. Anemia Panel: No results for input(s): VITAMINB12, FOLATE, FERRITIN, TIBC, IRON, RETICCTPCT in the last 72 hours. Urine analysis: No results found for: COLORURINE, APPEARANCEUR, LABSPEC, PHURINE, GLUCOSEU, HGBUR, BILIRUBINUR, KETONESUR, PROTEINUR, UROBILINOGEN, NITRITE, LEUKOCYTESUR Sepsis Labs: @LABRCNTIP (procalcitonin:4,lacticidven:4)  ) Recent Results (from the past 240 hour(s))  SARS CORONAVIRUS 2 (TAT 6-12 HRS) Nasal Swab Aptima Multi Swab     Status: None   Collection Time: 05/12/19 12:44 AM   Specimen: Aptima Multi Swab; Nasal Swab  Result Value Ref Range Status   SARS Coronavirus 2 NEGATIVE NEGATIVE Final    Comment: (NOTE) SARS-CoV-2 target nucleic acids are NOT DETECTED. The SARS-CoV-2 RNA is generally detectable in upper and lower respiratory specimens during the acute phase of infection. Negative results do not preclude SARS-CoV-2 infection, do not rule out co-infections with other  pathogens, and should not be used as the sole basis for treatment or other patient management decisions. Negative results must be combined with clinical observations, patient history, and epidemiological information. The expected result is Negative. Fact Sheet for Patients: SugarRoll.be Fact Sheet for Healthcare Providers: https://www.woods-mathews.com/ This test is not yet approved or cleared by the [place] FDA and  has been authorized for detection and/or diagnosis of SARS-CoV-2 by FDA under an Emergency Use Authorization (EUA). This EUA will remain  in effect (meaning this test can be used) for the duration of the COVID-19 declaration under Section 56 4(b)(1) of the Act, 21 U.S.C. section 360bbb-3(b)(1),  unless the authorization is terminated or revoked sooner. Performed at [facility], [place] [address] [place], [place] [address]   Culture, blood (routine x 2)     Status: None (Preliminary result)   Collection Time: 05/16/19  4:00 PM   Specimen: BLOOD RIGHT ARM  Result Value Ref Range Status   Specimen Description BLOOD RIGHT ARM  Final   Special Requests   Final    BOTTLES DRAWN AEROBIC ONLY Blood Culture adequate volume   Culture   Final    NO GROWTH 2 DAYS Performed at [facility], [address], [place], [place] [address]    Report Status PENDING  Incomplete  Culture, blood (routine x 2)     Status: None (Preliminary result)   Collection Time: 05/16/19  4:05 PM   Specimen: BLOOD RIGHT ARM  Result Value Ref Range Status   Specimen Description BLOOD RIGHT ARM  Final   Special Requests   Final    BOTTLES DRAWN AEROBIC ONLY Blood Culture adequate volume   Culture   Final    NO GROWTH 2 DAYS Performed at [facility], [place] [address] [place], [place] [address]    Report Status PENDING  Incomplete  MRSA PCR Screening     Status: None   Collection Time: 05/17/19 12:05 PM   Specimen: Nasal Mucosa; Nasopharyngeal  Result Value Ref Range Status   MRSA by PCR NEGATIVE NEGATIVE Final    Comment:        The GeneXpert MRSA Assay (FDA approved for NASAL specimens only), is one component of a comprehensive MRSA colonization surveillance program. It is not intended to diagnose MRSA infection nor to guide or monitor treatment for MRSA infections. Performed at [facility], [place] [address], [place],  [address]       Studies: No results found.  Scheduled Meds: . aspirin EC  81 mg Oral Daily  . cephALEXin  500 mg Oral Q8H  . enoxaparin (LOVENOX) injection  50 mg Subcutaneous Daily  . furosemide  40 mg Oral Daily  . gabapentin  100 mg Oral BID  . insulin aspart  0-15 Units Subcutaneous TID WC  . insulin aspart  0-5 Units Subcutaneous QHS  .  insulin glargine  15 Units Subcutaneous Daily  . latanoprost  1 drop Both Eyes QHS  . loratadine  10 mg Oral Daily  . pantoprazole  40 mg Oral Daily  . potassium chloride SA  20 mEq Oral Daily    Continuous Infusions:    LOS: 6 days     Kayleen Memos, MD [facility] Pager [phone]  If 7PM-7AM, please contact night-coverage [email or web address] Password [email or web address] 05/19/2019, 12:59 PM

## 2019-05-19 NOTE — Progress Notes (Signed)
Patient refuses pillow underneath bilateral lower extremities per MD order.

## 2019-05-19 NOTE — Progress Notes (Signed)
Attempted to place PIV for patient at this time. The patient states she wants to eat and wash up before new IV is placed. The patient asked if IV team could return in 31mins. RN Mable Fill was made aware to replace IV Team consult once the patient was done and back to bed.

## 2019-05-20 LAB — CBC
HCT: 28.7 % — ABNORMAL LOW (ref 36.0–46.0)
Hemoglobin: 9.4 g/dL — ABNORMAL LOW (ref 12.0–15.0)
MCH: 26 pg (ref 26.0–34.0)
MCHC: 32.8 g/dL (ref 30.0–36.0)
MCV: 79.3 fL — ABNORMAL LOW (ref 80.0–100.0)
Platelets: 337 10*3/uL (ref 150–400)
RBC: 3.62 MIL/uL — ABNORMAL LOW (ref 3.87–5.11)
RDW: 15.5 % (ref 11.5–15.5)
WBC: 12.1 10*3/uL — ABNORMAL HIGH (ref 4.0–10.5)
nRBC: 0.2 % (ref 0.0–0.2)

## 2019-05-20 LAB — GLUCOSE, CAPILLARY
Glucose-Capillary: 139 mg/dL — ABNORMAL HIGH (ref 70–99)
Glucose-Capillary: 170 mg/dL — ABNORMAL HIGH (ref 70–99)

## 2019-05-20 LAB — PROCALCITONIN: Procalcitonin: <0.1 ng/mL

## 2019-05-20 MED ORDER — VITAMIN D3 25 MCG (1000 UT) PO CAPS: 1000.0000 [IU] | ORAL_CAPSULE | Freq: Every day | ORAL | 0 refills | Status: DC

## 2019-05-20 MED ORDER — CEPHALEXIN 500 MG PO CAPS: 500.0000 mg | ORAL_CAPSULE | Freq: Three times a day (TID) | ORAL | 0 refills | Status: AC

## 2019-05-20 MED ORDER — CEPHALEXIN 500 MG PO CAPS: 500.0000 mg | ORAL_CAPSULE | Freq: Three times a day (TID) | ORAL | 0 refills | Status: DC

## 2019-05-20 MED ORDER — METFORMIN HCL 500 MG PO TABS
500.0000 mg | ORAL_TABLET | Freq: Two times a day (BID) | ORAL | Status: DC
  Administered 2019-05-20: 17:00:00 500 mg via ORAL
  Filled 2019-05-20: qty 1

## 2019-05-20 MED ORDER — METFORMIN HCL 500 MG PO TABS: 500.0000 mg | ORAL_TABLET | Freq: Two times a day (BID) | ORAL | 0 refills | Status: DC

## 2019-05-20 MED ORDER — BLOOD GLUCOSE MONITOR KIT: PACK | 0 refills | Status: AC

## 2019-05-20 MED ORDER — GABAPENTIN 100 MG PO CAPS: 100.0000 mg | ORAL_CAPSULE | Freq: Two times a day (BID) | ORAL | 0 refills | Status: DC

## 2019-05-20 MED ORDER — METFORMIN HCL 500 MG PO TABS: 1000.0000 mg | ORAL_TABLET | Freq: Two times a day (BID) | ORAL | Status: DC

## 2019-05-20 MED ORDER — LIVING WELL WITH DIABETES BOOK
Freq: Once | Status: AC
  Administered 2019-05-20: 17:00:00
  Filled 2019-05-20: qty 1

## 2019-05-20 NOTE — Progress Notes (Signed)
Pt is waiting for her ride until 6pm today. Discharge instructions given to pt. Diabetic teachings done, living well with Diabetes booklet given to pt. Discharged to home accompanied by sister.

## 2019-05-20 NOTE — Discharge Summary (Addendum)
Discharge Summary  Jessica Yates BWL:[id number] DOB: 1951-04-11  PCP: Sonia Side., FNP  Admit date: 05/11/2019 Discharge date: 05/20/2019  Time spent: 35 minutes  Recommendations for Outpatient Follow-up:  1. Follow-up with your cardiologist 2. Follow-up with your PCP 3. Take your medications as prescribed 4. Continue physical therapy 5. Elevate your legs as recommended by cardiology. 6. Fall precautions.  Discharge Diagnoses:  Active Hospital Problems   Diagnosis Date Noted   Cellulitis of right lower extremity 05/12/2019   Cellulitis 05/13/2019   CKD (chronic kidney disease) stage 3, GFR 30-59 ml/min (HCC) 05/12/2019   Normochromic normocytic anemia 05/12/2019   Cellulitis, leg 05/12/2019   Lymphedema 05/03/2012    Resolved Hospital Problems  No resolved problems to display.    Discharge Condition: Stable  Diet recommendation: Heart healthy carb modified diet.  Vitals:   05/20/19 0519 05/20/19 0829  BP: 113/68 119/78  Pulse: 83 96  Resp: 20 14  Temp: 98.3 F (36.8 C) 98.2 F (36.8 C)  SpO2: 100% 100%    History of present illness:  Jessica Yates an 68 y.o.femalewithhistory of combined chronic lymphedema and chronic venous insufficiency followed by cardiology, previous lower extremity cellulitis who presented to the ER because of ongoing right lower extremity pain, swelling, and redness in the past 2 days.  Admitted for right lower extremity cellulitis.  Was slow to improve despite IV antibiotics.  Completed 10 days of empiric IV antibiotics.  Switched to oral Keflex 500 mg 3 times daily, to complete 4 days outpatient.  05/20/19: Patient was seen and examined at her bedside this morning.  No acute events overnight.  Vital signs and labs reviewed and are stable.  Procalcitonin negative less than 0.10, leukocytosis resolving, afebrile on 05/20/2019.   On the day of discharge, the patient was hemodynamically stable.  She will need to continue  oral antibiotics Keflex 500 mg 3 times daily x4 days.  She will also need to follow-up with her cardiologist and PCP.  Continue physical therapy.  Elevate legs as recommended by cardiology.  Fall precautions.  Patient understands and agrees to plan.   Hospital Course:  Principal Problem:   Cellulitis of right lower extremity Active Problems:   Lymphedema   CKD (chronic kidney disease) stage 3, GFR 30-59 ml/min (HCC)   Normochromic normocytic anemia   Cellulitis, leg   Cellulitis  Resolving sepsis secondary to right lower extremity cellulitis in the setting of lymphedema Presented with fever with T-max of 100.7, leukocytosis, tachycardia, right lower extremity edema, tenderness to touch, erythema and warmth Started on Ancef on admission, completed 10 days. Blood cultures x2 peripherally no growth in 3 days Right lower extremity cellulitis was slow to improve initially requiring longer course of antibiotics. Right lower extremity cellulitis much improved and resolving. Sepsis physiology is resolving. Elevated procalcitonin 0.53 on 05/17/2019 >> <0.10 on 05/20/2019 Continue oral Keflex 500 mg 3 times daily Continue gabapentin 100 mg 3 times daily neuropathic pain  Improving right lower extremity edema in the setting of lymphedema Right lower extremity Doppler ultrasound negative for DVT No compression stockings indicated at this time due to active infection, cellulitis. Last IV started, switch to home dose oral Lasix  Uncontrolled presumed type 2 diabetes with hyperglycemia Hemoglobin A1c 6.9 on 05/11/2019 Worsening hyperglycemia Start metformin 500 mg twice daily Follow-up with your PCP Continue a heart healthy carb modified diet.  Neuropathy pain Reports improvement of symptomatology after starting gabapentin for right lower extremity burning pain Started gabapentin 100 mg  twice daily x15 days Recommend reassessment by PCP  Ambulatory dysfunction/physical debility PT assessed  and recommended home health PT Continue physical therapy at home Fall precautions  Chronic combined bilateral lymphedema and chronic venous insufficiency Resume compression stockings when her cellulitis has resolved, defer to cardiology. Follows with Dr.Dickson Continue p.o. Lasix 40 mg daily Elevate your legs as recommended by cardiology  Chronic diastolic CHF Last 2D echo LVEF 55 to 60% with grade 1 diastolic dysfunction No acute issues O2 saturation 100% on room air Continue p.o. Lasix Continue KCl supplementation while on diuretics Follow-up with your cardiologist  GERD Continue Protonix     Code Status:Full Code.    Procedures:  None  Consultations:  None   Discharge Exam: BP 119/78 (BP Location: Left Arm)    Pulse 96    Temp 98.2 F (36.8 C) (Oral)    Resp 14    Ht '5\' 7"'$  (1.702 m)    Wt 104.4 kg    SpO2 100%    BMI 36.05 kg/m   General: 68 y.o. year-old female well developed well nourished in no acute distress.  Alert and oriented x3.  Cardiovascular: Regular rate and rhythm with no rubs or gallops.  No thyromegaly or JVD noted.    Respiratory: Clear to auscultation with no wheezes or rales. Good inspiratory effort.  Abdomen: Soft nontender nondistended with normal bowel sounds x4 quadrants.  Musculoskeletal: Bilateral lower extremity edema from lymphedema combined with chronic venous stasis.  2 out of 4 pulses in all 4 extremities.  Psychiatry: Mood is appropriate for condition and setting  Discharge Instructions You were cared for by a hospitalist during your hospital stay. If you have any questions about your discharge medications or the care you received while you were in the hospital after you are discharged, you can call the unit and asked to speak with the hospitalist on call if the hospitalist that took care of you is not available. Once you are discharged, your primary care physician will handle any further medical issues. Please note that NO  REFILLS for any discharge medications will be authorized once you are discharged, as it is imperative that you return to your primary care physician (or establish a relationship with a primary care physician if you do not have one) for your aftercare needs so that they can reassess your need for medications and monitor your lab values.   Allergies as of 05/20/2019   No Known Allergies     Medication List    STOP taking these medications   benzonatate 200 MG capsule Commonly known as: TESSALON   pantoprazole 20 MG tablet Commonly known as: PROTONIX     TAKE these medications   acetaminophen 500 MG tablet Commonly known as: TYLENOL Take 1,000 mg by mouth every 6 (six) hours as needed for moderate pain.   aspirin 81 MG tablet Take 81 mg by mouth daily.   blood glucose meter kit and supplies Kit Dispense based on patient and insurance preference. Use up to four times daily as directed. (FOR ICD-9 250.00, 250.01).   cephALEXin 500 MG capsule Commonly known as: KEFLEX Take 1 capsule (500 mg total) by mouth every 8 (eight) hours for 4 days.   ferrous sulfate 325 (65 FE) MG tablet Take 325 mg by mouth daily with breakfast.   furosemide 40 MG tablet Commonly known as: LASIX '40mg'$  twice daily for 5 days, then '40mg'$  once daily. What changed:   how much to take  how to take this  when to take this  additional instructions   gabapentin 100 MG capsule Commonly known as: NEURONTIN Take 1 capsule (100 mg total) by mouth 2 (two) times daily.   ipratropium 0.03 % nasal spray Commonly known as: ATROVENT Place 2 sprays into the nose daily as needed for rhinitis.   metFORMIN 500 MG tablet Commonly known as: GLUCOPHAGE Take 1 tablet (500 mg total) by mouth 2 (two) times daily with a meal.   mirabegron ER 25 MG Tb24 tablet Commonly known as: MYRBETRIQ Take 1 tablet (25 mg total) by mouth daily. What changed:   when to take this  reasons to take this   omeprazole 40 MG  capsule Commonly known as: PRILOSEC Take 40 mg by mouth daily.   potassium chloride SA 20 MEQ tablet Commonly known as: K-DUR Take 20 mEq by mouth daily.   Travatan Z 0.004 % Soln ophthalmic solution Generic drug: Travoprost (BAK Free) Place 1 drop into both eyes At bedtime.   triamcinolone cream 0.1 % Commonly known as: KENALOG Apply 1 application topically daily as needed (for irritation).   Vitamin D3 25 MCG (1000 UT) Caps Take 1 capsule (1,000 Units total) by mouth daily. What changed: how much to take            Durable Medical Equipment  (From admission, onward)         Start     Ordered   05/17/19 1514  For home use only DME 3 n 1  Once     05/17/19 1513         No Known Allergies Follow-up Information    [facility] Follow up.   Specialty: Home Health Services Contact information: [place] [address] [phone]        Sonia Side., FNP. Call in 1 day(s).   Specialty: Family Medicine Why: please call for a post hospital follow up appointment Contact information: [place] [address] [phone]        Angelia Mould, MD. Call in 1 day(s).   Specialties: Vascular Surgery, Cardiology Why: please call for a post hospital follow up appointment for your legs (chronic lymphedema and chronic venous stasis). Contact information: [address] [place] [address] [phone]            The results of significant diagnostics from this hospitalization (including imaging, microbiology, ancillary and laboratory) are listed below for reference.    Significant Diagnostic Studies: Vas [place] Lower Extremity Venous (dvt)  Result Date: 05/12/2019  Lower Venous Study Indications: Edema, and Lymphedema, cellulitis.  Limitations: Body habitus, skin texture Comparison Study: Prior study from 07/23/18 is available for comparison Performing Technologist: Sharion Dove RVS  Examination  Guidelines: A complete evaluation includes B-mode imaging, spectral Doppler, color Doppler, and power Doppler as needed of all accessible portions of each vessel. Bilateral testing is considered an integral part of a complete examination. Limited examinations for reoccurring indications may be performed as noted.  +---------+---------------+---------+-----------+----------+--------------+  RIGHT     Compressibility Phasicity Spontaneity Properties Thrombus Aging  +---------+---------------+---------+-----------+----------+--------------+  CFV       Full            Yes       Yes                                    +---------+---------------+---------+-----------+----------+--------------+  SFJ  Full                                                             +---------+---------------+---------+-----------+----------+--------------+  FV Prox   Full                                                             +---------+---------------+---------+-----------+----------+--------------+  FV Mid    Full                                                             +---------+---------------+---------+-----------+----------+--------------+  FV Distal Full                                                             +---------+---------------+---------+-----------+----------+--------------+  PFV       Full                                                             +---------+---------------+---------+-----------+----------+--------------+  POP       Full            Yes       Yes                                    +---------+---------------+---------+-----------+----------+--------------+  PTV       Full                                                             +---------+---------------+---------+-----------+----------+--------------+   Right Technical Findings: Not visualized segments include peroneal vein.  +---------+---------------+---------+-----------+----------+--------------+  LEFT       Compressibility Phasicity Spontaneity Properties Thrombus Aging  +---------+---------------+---------+-----------+----------+--------------+  CFV       Full            Yes       Yes                                    +---------+---------------+---------+-----------+----------+--------------+  SFJ       Full                                                             +---------+---------------+---------+-----------+----------+--------------+  FV Prox   Full                                                             +---------+---------------+---------+-----------+----------+--------------+  FV Mid    Full                                                             +---------+---------------+---------+-----------+----------+--------------+  FV Distal Full                                                             +---------+---------------+---------+-----------+----------+--------------+  PFV       Full                                                             +---------+---------------+---------+-----------+----------+--------------+  POP       Full            Yes       Yes                                    +---------+---------------+---------+-----------+----------+--------------+   Left Technical Findings: Not visualized segments include Posterior tibial and peroneal veins.   Summary: Right: Findings appear essentially unchanged compared to previous examination. There is no evidence of deep vein thrombosis in the lower extremity. However, portions of this examination were limited- see technologist comments above. Left: Findings appear essentially unchanged compared to previous examination. There is no evidence of deep vein thrombosis in the lower extremity. However, portions of this examination were limited- see technologist comments above.  *See table(s) above for measurements and observations. Electronically signed by Deitra Mayo MD on 05/12/2019 at 6:07:08 PM.    Final     Microbiology: Recent  Results (from the past 240 hour(s))  SARS CORONAVIRUS 2 (TAT 6-12 HRS) Nasal Swab Aptima Multi Swab     Status: None   Collection Time: 05/12/19 12:44 AM   Specimen: Aptima Multi Swab; Nasal Swab  Result Value Ref Range Status   SARS Coronavirus 2 NEGATIVE NEGATIVE Final    Comment: (NOTE) SARS-CoV-2 target nucleic acids are NOT DETECTED. The SARS-CoV-2 RNA is generally detectable in upper and lower respiratory specimens during the acute phase of infection. Negative results do not preclude SARS-CoV-2 infection, do not rule out co-infections with other pathogens, and should not be used as the sole basis for treatment or other patient management decisions. Negative results must be combined with clinical observations, patient history, and epidemiological information. The expected result is Negative. Fact Sheet for Patients: SugarRoll.be Fact Sheet for Healthcare Providers: https://www.woods-mathews.com/ This test is not yet approved or cleared by the [place] FDA  and  has been authorized for detection and/or diagnosis of SARS-CoV-2 by FDA under an Emergency Use Authorization (EUA). This EUA will remain  in effect (meaning this test can be used) for the duration of the COVID-19 declaration under Section 56 4(b)(1) of the Act, 21 U.S.C. section 360bbb-3(b)(1), unless the authorization is terminated or revoked sooner. Performed at [facility], [place] [address] [place], [place] [address]   Culture, blood (routine x 2)     Status: None (Preliminary result)   Collection Time: 05/16/19  4:00 PM   Specimen: BLOOD RIGHT ARM  Result Value Ref Range Status   Specimen Description BLOOD RIGHT ARM  Final   Special Requests   Final    BOTTLES DRAWN AEROBIC ONLY Blood Culture adequate volume   Culture   Final    NO GROWTH 4 DAYS Performed at [facility], [place] [address], [place], [place] [address]    Report Status PENDING  Incomplete    Culture, blood (routine x 2)     Status: None (Preliminary result)   Collection Time: 05/16/19  4:05 PM   Specimen: BLOOD RIGHT ARM  Result Value Ref Range Status   Specimen Description BLOOD RIGHT ARM  Final   Special Requests   Final    BOTTLES DRAWN AEROBIC ONLY Blood Culture adequate volume   Culture   Final    NO GROWTH 4 DAYS Performed at [facility], [place] [address] [place], [place] [address]    Report Status PENDING  Incomplete  MRSA PCR Screening     Status: None   Collection Time: 05/17/19 12:05 PM   Specimen: Nasal Mucosa; Nasopharyngeal  Result Value Ref Range Status   MRSA by PCR NEGATIVE NEGATIVE Final    Comment:        The GeneXpert MRSA Assay (FDA approved for NASAL specimens only), is one component of a comprehensive MRSA colonization surveillance program. It is not intended to diagnose MRSA infection nor to guide or monitor treatment for MRSA infections. Performed at [facility], [place] [address], [place], [place] [address]      Labs: Basic Metabolic Panel: Recent Labs  Lab 05/14/19 1041 05/15/19 0246 05/17/19 1139 05/18/19 0626 05/19/19 0457  NA 137 138 139 136  --   K 3.6 3.5 3.5 4.0  --   CL 103 104 101 101  --   CO2 '24 25 26 26  '$ --   GLUCOSE 246* 194* 220* 186*  --   BUN <5* 6* 7* 9  --   CREATININE 0.90 0.96 0.95 0.82 0.81  CALCIUM 9.2 9.2 9.9 9.4  --    Liver Function Tests: No results for input(s): AST, ALT, ALKPHOS, BILITOT, PROT, ALBUMIN in the last 168 hours. No results for input(s): LIPASE, AMYLASE in the last 168 hours. No results for input(s): AMMONIA in the last 168 hours. CBC: Recent Labs  Lab 05/15/19 0246 05/17/19 1139 05/18/19 0626 05/19/19 0708 05/20/19 0711  WBC 12.0* 13.1* 12.5* 14.4* 12.1*  NEUTROABS  --  10.0*  --   --   --   HGB 9.3* 10.9* 9.2* 9.4* 9.4*  HCT 27.6* 33.1* 27.5* 28.2* 28.7*  MCV 78.6* 78.6* 78.6* 79.2* 79.3*  PLT 206 326 301 330 337   Cardiac Enzymes: No results for input(s):  CKTOTAL, CKMB, CKMBINDEX, TROPONINI in the last 168 hours. BNP: BNP (last 3 results) No results for input(s): BNP in the last 8760 hours.  ProBNP (last 3 results) No  results for input(s): PROBNP in the last 8760 hours.  CBG: Recent Labs  Lab 05/19/19 1138 05/19/19 1545 05/19/19 2102 05/20/19 0637 05/20/19 1125  GLUCAP 197* 134* 147* 139* 170*       Signed:  Kayleen Memos, MD [facility] 05/20/2019, 3:19 PM

## 2019-05-20 NOTE — Discharge Instructions (Signed)
Cellulitis, Adult ° °Cellulitis is a skin infection. The infected area is often warm, red, swollen, and sore. It occurs most often in the arms and lower legs. It is very important to get treated for this condition. °What are the causes? °This condition is caused by bacteria. The bacteria enter through a break in the skin, such as a cut, burn, insect bite, open sore, or crack. °What increases the risk? °This condition is more likely to occur in people who: °· Have a weak body defense system (immune system). °· Have open cuts, burns, bites, or scrapes on the skin. °· Are older than 68 years of age. °· Have a blood sugar problem (diabetes). °· Have a long-lasting (chronic) liver disease (cirrhosis) or kidney disease. °· Are very overweight (obese). °· Have a skin problem, such as: °? Itchy rash (eczema). °? Slow movement of blood in the veins (venous stasis). °? Fluid buildup below the skin (edema). °· Have been treated with high-energy rays (radiation). °· Use IV drugs. °What are the signs or symptoms? °Symptoms of this condition include: °· Skin that is: °? Red. °? Streaking. °? Spotting. °? Swollen. °? Sore or painful when you touch it. °? Warm. °· A fever. °· Chills. °· Blisters. °How is this diagnosed? °This condition is diagnosed based on: °· Medical history. °· Physical exam. °· Blood tests. °· Imaging tests. °How is this treated? °Treatment for this condition may include: °· Medicines to treat infections or allergies. °· Home care, such as: °? Rest. °? Placing cold or warm cloths (compresses) on the skin. °· Hospital care, if the condition is very bad. °Follow these instructions at home: °Medicines °· Take over-the-counter and prescription medicines only as told by your doctor. °· If you were prescribed an antibiotic medicine, take it as told by your doctor. Do not stop taking it even if you start to feel better. °General instructions ° °· Drink enough fluid to keep your pee (urine) pale yellow. °· Do not touch  or rub the infected area. °· Raise (elevate) the infected area above the level of your heart while you are sitting or lying down. °· Place cold or warm cloths on the area as told by your doctor. °· Keep all follow-up visits as told by your doctor. This is important. °Contact a doctor if: °· You have a fever. °· You do not start to get better after 1-2 days of treatment. °· Your bone or joint under the infected area starts to hurt after the skin has healed. °· Your infection comes back. This can happen in the same area or another area. °· You have a swollen bump in the area. °· You have new symptoms. °· You feel ill and have muscle aches and pains. °Get help right away if: °· Your symptoms get worse. °· You feel very sleepy. °· You throw up (vomit) or have watery poop (diarrhea) for a long time. °· You see red streaks coming from the area. °· Your red area gets larger. °· Your red area turns dark in color. °These symptoms may represent a serious problem that is an emergency. Do not wait to see if the symptoms will go away. Get medical help right away. Call your local emergency services (911 in the [place] Do not drive yourself to the hospital. °Summary °· Cellulitis is a skin infection. The area is often warm, red, swollen, and sore. °· This condition is treated with medicines, rest, and cold and warm cloths. °· Take all medicines only   as told by your doctor.  Tell your doctor if symptoms do not start to get better after 1-2 days of treatment. This information is not intended to replace advice given to you by your health care provider. Make sure you discuss any questions you have with your health care provider. Document Released: 02/16/2008 Document Revised: 01/19/2018 Document Reviewed: 01/19/2018 Elsevier Patient Education  2020 [employer].  Chronic Venous Insufficiency Chronic venous insufficiency is a condition where the leg veins cannot effectively pump blood from the legs to the heart. This happens  when the vein walls are either stretched, weakened, or damaged, or when the valves inside the vein are damaged. With the right treatment, you should be able to continue with an active life. This condition is also called venous stasis. What are the causes? Common causes of this condition include:  High blood pressure inside the veins (venous hypertension).  Sitting or standing too long, causing increased blood pressure in the leg veins.  A blood clot that blocks blood flow in a vein (deep vein thrombosis, DVT).  Inflammation of a vein (phlebitis) that causes a blood clot to form.  Tumors in the pelvis that cause blood to back up. What increases the risk? The following factors may make you more likely to develop this condition:  Having a family history of this condition.  Obesity.  Pregnancy.  Living without enough regular physical activity or exercise (sedentary lifestyle).  Smoking.  Having a job that requires long periods of standing or sitting in one place.  Being a certain age. Women in their 68s and 28s and men in their 40s are more likely to develop this condition. What are the signs or symptoms? Symptoms of this condition include:  Veins that are enlarged, bulging, or twisted (varicose veins).  Skin breakdown or ulcers.  Reddened skin or dark discoloration of skin on the leg between the knee and ankle.  Brown, smooth, tight, and painful skin just above the ankle, usually on the inside of the leg (lipodermatosclerosis).  Swelling of the legs. How is this diagnosed? This condition may be diagnosed based on:  Your medical history.  A physical exam.  Tests, such as: ? A procedure that creates an image of a blood vessel and nearby organs and provides information about blood flow through the blood vessel (duplex ultrasound). ? A procedure that tests blood flow (plethysmography). ? A procedure that looks at the veins using X-ray and dye (venogram). How is this  treated? The goals of treatment are to help you return to an active life and to minimize pain or disability. Treatment depends on the severity of your condition, and it may include:  Wearing compression stockings. These can help relieve symptoms and help prevent your condition from getting worse. However, they do not cure the condition.  Sclerotherapy. This procedure involves an injection of a solution that shrinks damaged veins.  Surgery. This may involve: ? Removing a diseased vein (vein stripping). ? Cutting off blood flow through the vein (laser ablation surgery). ? Repairing or reconstructing a valve within the affected vein. Follow these instructions at home:      Wear compression stockings as told by your health care provider. These stockings help to prevent blood clots and reduce swelling in your legs.  Take over-the-counter and prescription medicines only as told by your health care provider.  Stay active by exercising, walking, or doing different activities. Ask your health care provider what activities are safe for you and how much exercise  you need.  Drink enough fluid to keep your urine pale yellow.  Do not use any products that contain nicotine or tobacco, such as cigarettes, e-cigarettes, and chewing tobacco. If you need help quitting, ask your health care provider.  Keep all follow-up visits as told by your health care provider. This is important. Contact a health care provider if you:  Have redness, swelling, or more pain in the affected area.  See a red streak or line that goes up or down from the affected area.  Have skin breakdown or skin loss in the affected area, even if the breakdown is small.  Get an injury in the affected area. Get help right away if:  You get an injury and an open wound in the affected area.  You have: ? Severe pain that does not get better with medicine. ? Sudden numbness or weakness in the foot or ankle below the affected  area. ? Trouble moving your foot or ankle. ? A fever. ? Worse or persistent symptoms. ? Chest pain. ? Shortness of breath. Summary  Chronic venous insufficiency is a condition where the leg veins cannot effectively pump blood from the legs to the heart.  Chronic venous insufficiency occurs when the vein walls become stretched, weakened, or damaged, or when valves within the vein are damaged.  Treatment depends on how severe your condition is. It often involves wearing compression stockings and may involve having a procedure.  Make sure you stay active by exercising, walking, or doing different activities. Ask your health care provider what activities are safe for you and how much exercise you need. This information is not intended to replace advice given to you by your health care provider. Make sure you discuss any questions you have with your health care provider. Document Released: 01/03/2007 Document Revised: 05/23/2018 Document Reviewed: 05/23/2018 Elsevier Patient Education  2020 [employer].  Lymphedema  Lymphedema is swelling that is caused by the abnormal collection of lymph in the tissues under the skin. Lymph is fluid from the tissues in your body that is removed through the lymphatic system. This system is part of your body's defense system (immune system) and includes lymph nodes and lymph vessels. The lymph vessels collect and carry the excess fluid, fats, proteins, and wastes from the tissues of the body to the bloodstream. This system also works to clean and remove bacteria and waste products from the body. Lymphedema occurs when the lymphatic system is blocked. When the lymph vessels or lymph nodes are blocked or damaged, lymph does not drain properly. This causes an abnormal buildup of lymph, which leads to swelling in the affected area. This may include the trunk area, or an arm or leg. Lymphedema cannot be cured by medicines, but various methods can be used to help reduce  the swelling. There are two types of lymphedema: primary lymphedema and secondary lymphedema. What are the causes? The cause of this condition depends on the type of lymphedema that you have.  Primary lymphedema is caused by the absence of lymph vessels or having abnormal lymph vessels at birth.  Secondary lymphedema occurs when lymph vessels are blocked or damaged. Secondary lymphedema is more common. Common causes of lymph vessel blockage include: ? Skin infection, such as cellulitis. ? Infection by parasites (filariasis). ? Injury. ? Radiation therapy. ? Cancer. ? Formation of scar tissue. ? Surgery. What are the signs or symptoms? Symptoms of this condition include:  Swelling of the arm or leg.  A heavy or tight  feeling in the arm or leg.  Swelling of the feet, toes, or fingers. Shoes or rings may fit more tightly than before.  Redness of the skin over the affected area.  Limited movement of the affected limb.  Sensitivity to touch or discomfort in the affected limb. How is this diagnosed? This condition may be diagnosed based on:  Your symptoms and medical history.  A physical exam.  Bioimpedance spectroscopy. In this test, painless electrical currents are used to measure fluid levels in your body.  Imaging tests, such as: ? Lymphoscintigraphy. In this test, a low dose of a radioactive substance is injected to trace the flow of lymph through the lymph vessels. ? MRI. ? CT scan. ? Duplex ultrasound. This test uses sound waves to produce images of the vessels and the blood flow on a screen. ? Lymphangiography. In this test, a contrast dye is injected into the lymph vessel to help show blockages. How is this treated? Treatment for this condition may depend on the cause of your lymphedema. Treatment may include:  Complete decongestive therapy (CDT). This is done by a certified lymphedema therapist to reduce fluid congestion. This therapy includes: ? Manual lymph  drainage. This is a special massage technique that promotes lymph drainage out of a limb. ? Skin care. ? Compression wrapping of the affected area. ? Specific exercises. Certain exercises can help fluid move out of the affected limb.  Compression. Various methods may be used to apply pressure to the affected limb to reduce the swelling. They include: ? Wearing compression stockings or sleeves on the affected limb. ? Wrapping the affected limb with special bandages.  Surgery. This is usually done for severe cases only. For example, surgery may be done if you have trouble moving the limb or if the swelling does not get better with other treatments. If an underlying condition is causing the lymphedema, treatment for that condition will be done. For example, antibiotic medicines may be used to treat an infection. Follow these instructions at home: Self-care  The affected area is more likely to become injured or infected. Take these steps to help prevent infection: ? Keep the affected area clean and dry. ? Use approved creams or lotions to keep the skin moisturized. ? Protect your skin from cuts:  Use gloves while cooking or gardening.  Do not walk barefoot.  If you shave the affected area, use an Copy.  Do not wear tight clothes, shoes, or jewelry.  Eat a healthy diet that includes a lot of fruits and vegetables. Activity  Exercise regularly as directed by your health care provider.  Do not sit with your legs crossed.  When possible, keep the affected limb raised (elevated) above the level of your heart.  Avoid carrying things with an arm that is affected by lymphedema. General instructions  Wear compression stockings or sleeves as told by your health care provider.  Note any changes in size of the affected limb. You may be instructed to take regular measurements and keep track of them.  Take over-the-counter and prescription medicines only as told by your health care  provider.  If you were prescribed an antibiotic medicine, take or apply it as told by your health care provider. Do not stop using the antibiotic even if you start to feel better.  Do not use heating pads or ice packs over the affected area.  Avoid having blood draws, IV insertions, or blood pressure checked on the affected limb.  Keep all follow-up visits  as told by your health care provider. This is important. Contact a health care provider if you:  Continue to have swelling in your limb.  Have a cut that does not heal.  Have redness or pain in the affected area. Get help right away if you:  Have new swelling in your limb that comes on suddenly.  Develop purplish spots, rash or sores (lesions) on your affected limb.  Have shortness of breath.  Have a fever or chills. Summary  Lymphedema is swelling that is caused by the abnormal collection of lymph in the tissues under the skin.  Lymph is fluid from the tissues in your body that is removed through the lymphatic system. This system collects and carries excess fluid, fats, proteins, and wastes from the tissues of the body to the bloodstream.  Lymphedema causes swelling, pain, and redness in the affected area. This may include the trunk area, or an arm or leg.  Treatment for this condition may depend on the cause of your lymphedema. Treatment may include complete decongestive therapy (CDT), compression methods, surgery, or treating the underlying cause. This information is not intended to replace advice given to you by your health care provider. Make sure you discuss any questions you have with your health care provider. Document Released: 06/27/2007 Document Revised: 09/12/2017 Document Reviewed: 09/12/2017 Elsevier Patient Education  2020 [employer].

## 2019-05-21 LAB — CULTURE, BLOOD (ROUTINE X 2)
Culture: NO GROWTH
Culture: NO GROWTH
Special Requests: ADEQUATE
Special Requests: ADEQUATE

## 2019-06-06 ENCOUNTER — Encounter: Payer: BC Managed Care – PPO | Admitting: Vascular Surgery

## 2019-06-14 ENCOUNTER — Ambulatory Visit (INDEPENDENT_AMBULATORY_CARE_PROVIDER_SITE_OTHER): Payer: Medicare HMO | Admitting: Vascular Surgery

## 2019-06-14 ENCOUNTER — Other Ambulatory Visit: Payer: Self-pay

## 2019-06-14 ENCOUNTER — Encounter: Payer: Self-pay | Admitting: Vascular Surgery

## 2019-06-14 VITALS — BP 146/83 | HR 113 | Temp 97.8°F | Resp 16 | Ht 67.5 in | Wt 218.0 lb

## 2019-06-14 DIAGNOSIS — I890 Lymphedema, not elsewhere classified: Secondary | ICD-10-CM

## 2019-06-14 NOTE — Progress Notes (Signed)
Patient name: Jessica Yates MRN: [id number] DOB: [birth year] Sex: female  REASON FOR VISIT:   Bilateral leg swelling with history of lymphedema.  The consult is requested by Dr. Dustin Folks.  HPI:   Jessica Yates is a pleasant 68 y.o. female with a long history of lymphedema and chronic venous insufficiency of both lower extremities.  She states that she had been having more problems with swelling in the left leg but in August of this year was admitted with progressive swelling on the right side and cellulitis.  She was treated with antibiotics and ultimately went home on p.o. Keflex.  She is had persistent leg swelling with a history of lymphedema and was referred for vascular consultation.  I last saw her 3-1/2 years ago with combined chronic venous insufficiency and chronic lymphedema.  We had a long discussion about the importance of leg elevation and compression therapy and I think she is compliant with her compression stockings.  She has knee-high stockings with a gradient of 20 to 30 mmHg.  She states that she does have some aching pain in her legs but her swelling is not especially symptomatic.  She does try to elevate her legs on a leg wedge.  This does help with the swelling.  She is had no previous history of radiation therapy or surgery to the abdomen or groins.  She tells me that she has had lymphedema since she was 68 years old.  Her swelling does limit her mobility some.  She denies any issues with lymphorrhea.  Her swelling does not resolve with leg elevation but does improve.  She has had issues with recurrent cellulitis.  She has tried multiple conservative measures including leg elevation, compression therapy and exercise as tolerated.  She does not have access to manual lymphatic drainage massage in our community currently.  I have reviewed the records from the referring office.  The patient was seen on 04/06/2019.  Patient had known lymphedema and had been on antibiotics  for recurrent cellulitis.  Past Medical History:  Diagnosis Date  . Anemia 2002  . Anxiety   . Arthritis   . Asthma   . Blood clot in vein   . Cellulitis   . Cough   . Depression 02/06/2008  . DVT (deep venous thrombosis) ([place]) 2002   left leg  . Glaucoma   . Lymphedema   . Palpitations   . Peripheral venous insufficiency   . Seasonal allergies   . Sickle cell trait ([place])   . Vitamin D deficiency     Family History  Problem Relation Age of Onset  . Allergies Mother   . Asthma Mother   . Other Mother        enlarged heart  . Arthritis Mother   . Breast cancer Mother   . Diabetes Mother   . Hypertension Mother   . Cancer Mother        breast  . Hyperlipidemia Mother   . Heart disease Maternal Grandmother   . Breast cancer Maternal Grandmother   . Birth defects Maternal Grandmother        breast  . Cancer Father 42       leukemia  . Allergies Sister   . Arthritis Sister     SOCIAL HISTORY: Social History   Tobacco Use  . Smoking status: Never Smoker  . Smokeless tobacco: Never Used  Substance Use Topics  . Alcohol use: No    No Known Allergies  Current Outpatient  Medications  Medication Sig Dispense Refill  . acetaminophen (TYLENOL) 500 MG tablet Take 1,000 mg by mouth every 6 (six) hours as needed for moderate pain.    Marland Kitchen aspirin 81 MG tablet Take 81 mg by mouth daily.      . blood glucose meter kit and supplies KIT Dispense based on patient and insurance preference. Use up to four times daily as directed. (FOR ICD-9 250.00, 250.01). 1 each 0  . Cholecalciferol (VITAMIN D3) 25 MCG (1000 UT) CAPS Take 1 capsule (1,000 Units total) by mouth daily. 30 capsule 0  . ferrous sulfate 325 (65 FE) MG tablet Take 325 mg by mouth daily with breakfast.    . furosemide (LASIX) 40 MG tablet '40mg'$  twice daily for 5 days, then '40mg'$  once daily. (Patient taking differently: Take 40 mg by mouth daily. ) 60 tablet 0  . gabapentin (NEURONTIN) 100 MG capsule Take 1 capsule (100  mg total) by mouth 2 (two) times daily. 30 capsule 0  . glipiZIDE (GLUCOTROL) 10 MG tablet TAKE ONE TABLET BY MOUTH ONCE DAILY BEFORE A MEAL    . ipratropium (ATROVENT) 0.03 % nasal spray Place 2 sprays into the nose daily as needed for rhinitis.   6  . mirabegron ER (MYRBETRIQ) 25 MG TB24 tablet Take 1 tablet (25 mg total) by mouth daily. (Patient taking differently: Take 25 mg by mouth daily as needed (overactive bladder). ) 30 tablet 11  . omeprazole (PRILOSEC) 40 MG capsule Take 40 mg by mouth daily.    . potassium chloride SA (K-DUR,KLOR-CON) 20 MEQ tablet Take 20 mEq by mouth daily.     . traMADol (ULTRAM) 50 MG tablet TAKE 1 TABLET (50 MG) BY MOUTH EVERY 6 HOURS AS NEEDED FOR PAIN FOR 4 DAYS    . TRAVATAN Z 0.004 % SOLN ophthalmic solution Place 1 drop into both eyes At bedtime.    . triamcinolone cream (KENALOG) 0.1 % Apply 1 application topically daily as needed (for irritation).     . metFORMIN (GLUCOPHAGE) 500 MG tablet Take 1 tablet (500 mg total) by mouth 2 (two) times daily with a meal. (Patient not taking: Reported on 06/14/2019) 60 tablet 0   No current facility-administered medications for this visit.     REVIEW OF SYSTEMS:  '[X]'$  denotes positive finding, '[ ]'$  denotes negative finding Cardiac  Comments:  Chest pain or chest pressure:    Shortness of breath upon exertion:    Short of breath when lying flat:    Irregular heart rhythm:        Vascular    Pain in calf, thigh, or hip brought on by ambulation: x   Pain in feet at night that wakes you up from your sleep:     Blood clot in your veins:    Leg swelling:  x       Pulmonary    Oxygen at home:    Productive cough:     Wheezing:         Neurologic    Sudden weakness in arms or legs:  x   Sudden numbness in arms or legs:     Sudden onset of difficulty speaking or slurred speech:    Temporary loss of vision in one eye:     Problems with dizziness:         Gastrointestinal    Blood in stool:     Vomited blood:          Genitourinary    Burning when  urinating:     Blood in urine:        Psychiatric    Major depression:  x       Hematologic    Bleeding problems:    Problems with blood clotting too easily:        Skin    Rashes or ulcers:        Constitutional    Fever or chills:     PHYSICAL EXAM:   Vitals:   06/14/19 1524  BP: (!) 146/83  Pulse: (!) 113  Resp: 16  Temp: 97.8 F (36.6 C)  TempSrc: Temporal  SpO2: 100%  Weight: 218 lb (98.9 kg)  Height: 5' 7.5" (1.715 m)   Body mass index is 33.64 kg/m.  GENERAL: The patient is a well-nourished female, in no acute distress. The vital signs are documented above. CARDIAC: There is a regular rate and rhythm.  VASCULAR: I do not detect carotid bruits. Because of her leg swelling I cannot palpate pedal pulses.  I was able to obtain a dopplerable dorsalis pedis and posterior tibial signal bilaterally.  The signals were brisk. The patient has significant nonpitting edema involving her feet ankles and legs.     She has peau d'orange on the right.  She has hyperpigmentation bilaterally but especially on the right.    RIGHT  LEFT   THIGH:  24 inches 23 inches CALF:  19 inches 16 inches ANKLE: 12 inches 12-3/4 inches  PULMONARY: There is good air exchange bilaterally without wheezing or rales. ABDOMEN: Soft and non-tender with normal pitched bowel sounds.  MUSCULOSKELETAL: There are no major deformities or cyanosis. NEUROLOGIC: No focal weakness or paresthesias are detected. SKIN: There are no ulcers or rashes noted. PSYCHIATRIC: The patient has a normal affect.  DATA:    VENOUS DUPLEX: I have reviewed the venous duplex scan that was done on 05/12/2019.  There was no evidence of DVT bilaterally.  ECHO: I reviewed her echo that was done on 07/26/2018.  Her systolic function was normal.  Estimated ejection fraction was 55 to 60%.  VENOUS DUPLEX: I reviewed her venous duplex scan that was done in November 2019.  This was of  the left lower extremity only and there was no evidence of DVT in the left lower extremity.  MEDICAL ISSUES:   COMBINED CHRONIC VENOUS INSUFFICIENCY AND LYMPHEDEMA: This patient has CEAP C4b venous disease.  By history this patient has had lymphedema since 68 years of age consistent with lymphedema praecox.  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  She is very good about wearing her compression stockings and try to elevate her legs.  Her activity and her ability to exercise is clearly limited by her swelling and range of motion.  I have encouraged her to continue with concurrent conservative measures including leg elevation, compression therapy, exercise as tolerated, and skin lubrication.  If she develops worsening symptoms I think she might be a candidate for the Flexitouch Plus pneumatic compression system.  I do plan on seeing her back in 1 year unless she call sooner.  She knows to call sooner if her symptoms or swelling progresses.  I have ordered formal venous reflux test at that time as it has been many years since she has had a reflux test.    Deitra Mayo [facility] [phone]

## 2019-07-14 ENCOUNTER — Other Ambulatory Visit: Payer: Self-pay

## 2019-07-14 DIAGNOSIS — Z20822 Contact with and (suspected) exposure to covid-19: Secondary | ICD-10-CM

## 2019-07-16 LAB — NOVEL CORONAVIRUS, NAA: SARS-CoV-2, NAA: NOT DETECTED

## 2019-07-29 ENCOUNTER — Encounter (HOSPITAL_COMMUNITY): Payer: Self-pay | Admitting: Emergency Medicine

## 2019-07-29 ENCOUNTER — Emergency Department (HOSPITAL_COMMUNITY): Payer: Medicare HMO

## 2019-07-29 ENCOUNTER — Emergency Department (HOSPITAL_COMMUNITY)
Admission: EM | Admit: 2019-07-29 | Discharge: 2019-07-30 | Disposition: A | Discharge status: Home / Self Care | Payer: Medicare HMO | Attending: Emergency Medicine | Admitting: Emergency Medicine

## 2019-07-29 ENCOUNTER — Other Ambulatory Visit: Payer: Self-pay

## 2019-07-29 DIAGNOSIS — I509 Heart failure, unspecified: Secondary | ICD-10-CM | POA: Diagnosis not present

## 2019-07-29 DIAGNOSIS — Z86718 Personal history of other venous thrombosis and embolism: Secondary | ICD-10-CM | POA: Insufficient documentation

## 2019-07-29 DIAGNOSIS — Z79899 Other long term (current) drug therapy: Secondary | ICD-10-CM | POA: Diagnosis not present

## 2019-07-29 DIAGNOSIS — N183 Chronic kidney disease, stage 3 unspecified: Secondary | ICD-10-CM | POA: Insufficient documentation

## 2019-07-29 DIAGNOSIS — R05 Cough: Secondary | ICD-10-CM | POA: Insufficient documentation

## 2019-07-29 DIAGNOSIS — L03116 Cellulitis of left lower limb: Secondary | ICD-10-CM | POA: Diagnosis not present

## 2019-07-29 DIAGNOSIS — R509 Fever, unspecified: Secondary | ICD-10-CM | POA: Insufficient documentation

## 2019-07-29 DIAGNOSIS — D573 Sickle-cell trait: Secondary | ICD-10-CM | POA: Insufficient documentation

## 2019-07-29 DIAGNOSIS — R600 Localized edema: Secondary | ICD-10-CM | POA: Diagnosis present

## 2019-07-29 LAB — CBC WITH DIFFERENTIAL/PLATELET
Abs Immature Granulocytes: 0.22 10*3/uL — ABNORMAL HIGH (ref 0.00–0.07)
Basophils Absolute: 0 10*3/uL (ref 0.0–0.1)
Basophils Relative: 0 %
Eosinophils Absolute: 0 10*3/uL (ref 0.0–0.5)
Eosinophils Relative: 0 %
HCT: 32.7 % — ABNORMAL LOW (ref 36.0–46.0)
Hemoglobin: 10.5 g/dL — ABNORMAL LOW (ref 12.0–15.0)
Immature Granulocytes: 1 %
Lymphocytes Relative: 4 %
Lymphs Abs: 0.7 10*3/uL (ref 0.7–4.0)
MCH: 25.6 pg — ABNORMAL LOW (ref 26.0–34.0)
MCHC: 32.1 g/dL (ref 30.0–36.0)
MCV: 79.8 fL — ABNORMAL LOW (ref 80.0–100.0)
Monocytes Absolute: 0.5 10*3/uL (ref 0.1–1.0)
Monocytes Relative: 3 %
Neutro Abs: 14.2 10*3/uL — ABNORMAL HIGH (ref 1.7–7.7)
Neutrophils Relative %: 92 %
Platelets: 212 10*3/uL (ref 150–400)
RBC: 4.1 MIL/uL (ref 3.87–5.11)
RDW: 17.7 % — ABNORMAL HIGH (ref 11.5–15.5)
WBC: 15.6 10*3/uL — ABNORMAL HIGH (ref 4.0–10.5)
nRBC: 0 % (ref 0.0–0.2)

## 2019-07-29 LAB — COMPREHENSIVE METABOLIC PANEL
ALT: 24 U/L (ref 0–44)
AST: 29 U/L (ref 15–41)
Albumin: 3.2 g/dL — ABNORMAL LOW (ref 3.5–5.0)
Alkaline Phosphatase: 97 U/L (ref 38–126)
Anion gap: 11 (ref 5–15)
BUN: 18 mg/dL (ref 8–23)
CO2: 21 mmol/L — ABNORMAL LOW (ref 22–32)
Calcium: 10 mg/dL (ref 8.9–10.3)
Chloride: 104 mmol/L (ref 98–111)
Creatinine, Ser: 1.55 mg/dL — ABNORMAL HIGH (ref 0.44–1.00)
GFR calc Af Amer: 39 mL/min — ABNORMAL LOW (ref >60)
GFR calc non Af Amer: 34 mL/min — ABNORMAL LOW (ref >60)
Glucose, Bld: 76 mg/dL (ref 70–99)
Potassium: 3.4 mmol/L — ABNORMAL LOW (ref 3.5–5.1)
Sodium: 136 mmol/L (ref 135–145)
Total Bilirubin: 0.4 mg/dL (ref 0.3–1.2)
Total Protein: 7.3 g/dL (ref 6.5–8.1)

## 2019-07-29 LAB — LACTIC ACID, PLASMA: Lactic Acid, Venous: 2 mmol/L (ref 0.5–1.9)

## 2019-07-29 MED ORDER — SODIUM CHLORIDE 0.9% FLUSH: 3.0000 mL | Freq: Once | INTRAVENOUS | Status: DC

## 2019-07-29 NOTE — ED Triage Notes (Signed)
Patient here with leg swelling.  She states she has lymphedema, she has more swelling in left leg than usual and she states she is having pain.  She has also been having fevers, controlled with tylenol and ASA.  She states that it was 102 before the tylenol.

## 2019-07-30 ENCOUNTER — Emergency Department (HOSPITAL_BASED_OUTPATIENT_CLINIC_OR_DEPARTMENT_OTHER): Payer: Medicare HMO

## 2019-07-30 DIAGNOSIS — R609 Edema, unspecified: Secondary | ICD-10-CM | POA: Diagnosis not present

## 2019-07-30 DIAGNOSIS — L03116 Cellulitis of left lower limb: Secondary | ICD-10-CM | POA: Diagnosis not present

## 2019-07-30 MED ORDER — CEPHALEXIN 500 MG PO CAPS: 500.0000 mg | ORAL_CAPSULE | Freq: Four times a day (QID) | ORAL | 0 refills | Status: DC

## 2019-07-30 MED ORDER — CEPHALEXIN 250 MG PO CAPS
500.0000 mg | ORAL_CAPSULE | Freq: Once | ORAL | Status: AC
  Administered 2019-07-30: 500 mg via ORAL
  Filled 2019-07-30: qty 2

## 2019-07-30 NOTE — ED Notes (Signed)
Patient has left the department

## 2019-07-30 NOTE — ED Provider Notes (Addendum)
[facility] EMERGENCY DEPARTMENT Provider Note   CSN: [id number] Arrival date & time: 07/29/19  2046     History   Chief Complaint Chief Complaint  Patient presents with   Leg Swelling    HPI Jessica Yates is a 68 y.o. female.     HPI 68 year old female with history of DVT, lymphedema of both lower extremities, cellulitis comes in with chief complaint of left leg swelling.  She reports that she started noticing increasing swelling in her left lower extremity about 2 or 3 days ago.  There is associated discomfort that extends up towards the thigh.  Patient denies any shortness of breath or chest pain.  Review of system is positive for cough, fevers.  Patient states that her cough has been chronic and that she was tested at the end of the month and does not want to be Covid tested again.  She has history of DVT, not on any blood thinners at this time.  Past Medical History:  Diagnosis Date   Anemia 2002   Anxiety    Arthritis    Asthma    Blood clot in vein    Cellulitis    Cough    Depression 02/06/2008   DVT (deep venous thrombosis) ([place]) 2002   left leg   Glaucoma    Lymphedema    Palpitations    Peripheral venous insufficiency    Seasonal allergies    Sickle cell trait ([facility])    Vitamin D deficiency     Patient Active Problem List   Diagnosis Date Noted   Cellulitis 05/13/2019   Cellulitis of right lower extremity 05/12/2019   CKD (chronic kidney disease) stage 3, GFR 30-59 ml/min 05/12/2019   Normochromic normocytic anemia 05/12/2019   Cellulitis, leg 05/12/2019   Cellulitis and abscess of left leg 07/22/2018   Cough 05/07/2016   Upper airway cough syndrome 05/07/2016   Sebaceous cyst of labia 04/19/2016   OAB (overactive bladder) 04/19/2016   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   Lymphedema 05/03/2012   CHF (congestive heart failure) ([place]) 04/16/2011   SICKLE-CELL TRAIT  08/01/2007   UNSPECIFIED GLAUCOMA 08/01/2007   DVT 08/01/2007   VENOUS INSUFFICIENCY 08/01/2007   ALLERGIC RHINITIS 08/01/2007   Extrinsic asthma 08/01/2007    Past Surgical History:  Procedure Laterality Date   COSMETIC SURGERY  1975   nasal reconstruction   TOTAL ABDOMINAL HYSTERECTOMY  07/2001     OB History    Gravida  0   Para  0   Term  0   Preterm  0   AB  0   Living  0     SAB  0   TAB  0   Ectopic  0   Multiple  0   Live Births  0            Home Medications    Prior to Admission medications   Medication Sig Start Date End Date Taking? Authorizing Provider  acetaminophen (TYLENOL) 500 MG tablet Take 1,000 mg by mouth every 6 (six) hours as needed for moderate pain.    [provider]  aspirin 81 MG tablet Take 81 mg by mouth daily.      [provider]  blood glucose meter kit and supplies KIT Dispense based on patient and insurance preference. Use up to four times daily as directed. (FOR ICD-9 250.00, 250.01). 05/20/19   Kayleen Memos, DO  cephALEXin (KEFLEX) 500 MG capsule Take 1  capsule (500 mg total) by mouth 4 (four) times daily. 07/30/19   Varney Biles, MD  Cholecalciferol (VITAMIN D3) 25 MCG (1000 UT) CAPS Take 1 capsule (1,000 Units total) by mouth daily. 05/20/19   Kayleen Memos, DO  ferrous sulfate 325 (65 FE) MG tablet Take 325 mg by mouth daily with breakfast.    [provider]  furosemide (LASIX) 40 MG tablet '40mg'$  twice daily for 5 days, then '40mg'$  once daily. Patient taking differently: Take 40 mg by mouth daily.  07/28/18   Bonnielee Haff, MD  gabapentin (NEURONTIN) 100 MG capsule Take 1 capsule (100 mg total) by mouth 2 (two) times daily. 05/20/19   Kayleen Memos, DO  glipiZIDE (GLUCOTROL) 10 MG tablet TAKE ONE TABLET BY MOUTH ONCE DAILY BEFORE A MEAL 05/25/19   [provider]  ipratropium (ATROVENT) 0.03 % nasal spray Place 2 sprays into the nose daily as needed for rhinitis.  04/18/18    [provider]  metFORMIN (GLUCOPHAGE) 500 MG tablet Take 1 tablet (500 mg total) by mouth 2 (two) times daily with a meal. Patient not taking: Reported on 06/14/2019 05/20/19   Kayleen Memos, DO  mirabegron ER (MYRBETRIQ) 25 MG TB24 tablet Take 1 tablet (25 mg total) by mouth daily. Patient taking differently: Take 25 mg by mouth daily as needed (overactive bladder).  04/19/16   Terrance Mass, MD  omeprazole (PRILOSEC) 40 MG capsule Take 40 mg by mouth daily. 05/01/19   [provider]  potassium chloride SA (K-DUR,KLOR-CON) 20 MEQ tablet Take 20 mEq by mouth daily.  04/27/16   [provider]  traMADol (ULTRAM) 50 MG tablet TAKE 1 TABLET (50 MG) BY MOUTH EVERY 6 HOURS AS NEEDED FOR PAIN FOR 4 DAYS 05/25/19   [provider]  TRAVATAN Z 0.004 % SOLN ophthalmic solution Place 1 drop into both eyes At bedtime. 03/14/11   [provider]  triamcinolone cream (KENALOG) 0.1 % Apply 1 application topically daily as needed (for irritation).     [provider]    Family History Family History  Problem Relation Age of Onset   Allergies Mother    Asthma Mother    Other Mother        enlarged heart   Arthritis Mother    Breast cancer Mother    Diabetes Mother    Hypertension Mother    Cancer Mother        breast   Hyperlipidemia Mother    Heart disease Maternal Grandmother    Breast cancer Maternal Grandmother    Birth defects Maternal Grandmother        breast   Cancer Father 29       leukemia   Allergies Sister    Arthritis Sister     Social History Social History   Tobacco Use   Smoking status: Never Smoker   Smokeless tobacco: Never Used  Substance Use Topics   Alcohol use: No   Drug use: No     Allergies   Patient has no known allergies.   Review of Systems Review of Systems  Constitutional: Positive for fever. Negative for activity change.  Respiratory: Positive for cough.   Gastrointestinal:  Negative for nausea and vomiting.  Skin: Positive for rash.  Allergic/Immunologic: Negative for immunocompromised state.     Physical Exam Updated Vital Signs BP 111/74 (BP Location: Right Arm)    Pulse 90    Temp 99 F (37.2 C)    Resp 18  SpO2 100%   Physical Exam Vitals signs and nursing note reviewed.  Constitutional:      Appearance: She is well-developed.  HENT:     Head: Normocephalic and atraumatic.  Neck:     Musculoskeletal: Normal range of motion and neck supple.  Cardiovascular:     Rate and Rhythm: Normal rate.  Pulmonary:     Effort: Pulmonary effort is normal.  Abdominal:     General: Bowel sounds are normal.  Musculoskeletal:     Comments: Bilateral lower extremity edema appreciated.  Left lower extremity is worse than right and there is associated erythema.  Skin:    General: Skin is warm and dry.  Neurological:     Mental Status: She is alert and oriented to person, place, and time.      ED Treatments / Results  Labs (all labs ordered are listed, but only abnormal results are displayed) Labs Reviewed  LACTIC ACID, PLASMA - Abnormal; Notable for the following components:      Result Value   Lactic Acid, Venous 2.0 (*)    All other components within normal limits  COMPREHENSIVE METABOLIC PANEL - Abnormal; Notable for the following components:   Potassium 3.4 (*)    CO2 21 (*)    Creatinine, Ser 1.55 (*)    Albumin 3.2 (*)    GFR calc non Af Amer 34 (*)    GFR calc Af Amer 39 (*)    All other components within normal limits  CBC WITH DIFFERENTIAL/PLATELET - Abnormal; Notable for the following components:   WBC 15.6 (*)    Hemoglobin 10.5 (*)    HCT 32.7 (*)    MCV 79.8 (*)    MCH 25.6 (*)    RDW 17.7 (*)    Neutro Abs 14.2 (*)    Abs Immature Granulocytes 0.22 (*)    All other components within normal limits    EKG None  Radiology Dg Chest 2 View  Result Date: 07/29/2019 CLINICAL DATA:  Fever EXAM: CHEST - 2 VIEW COMPARISON:   07/22/2018 FINDINGS: No focal airspace disease or pleural effusion. Stable cardiomediastinal silhouette. No pneumothorax. IMPRESSION: No active cardiopulmonary disease. Electronically Signed   By: Donavan Foil M.D.   On: 07/29/2019 21:32   Vas [place] Lower Extremity Venous (dvt) (only [facility])  Result Date: 07/30/2019  Lower Venous Study Indications: Edema.  Limitations: Body habitus and poor ultrasound/tissue interface. Comparison Study: 05/12/19 Performing Technologist: Abram Sander RVS  Examination Guidelines: A complete evaluation includes B-mode imaging, spectral Doppler, color Doppler, and power Doppler as needed of all accessible portions of each vessel. Bilateral testing is considered an integral part of a complete examination. Limited examinations for reoccurring indications may be performed as noted.  +-----+---------------+---------+-----------+----------+--------------+  RIGHT Compressibility Phasicity Spontaneity Properties Thrombus Aging  +-----+---------------+---------+-----------+----------+--------------+  CFV   Full            Yes       Yes                                    +-----+---------------+---------+-----------+----------+--------------+   +---------+---------------+---------+-----------+----------+--------------+  LEFT      Compressibility Phasicity Spontaneity Properties Thrombus Aging  +---------+---------------+---------+-----------+----------+--------------+  CFV       Full            Yes       Yes                                    +---------+---------------+---------+-----------+----------+--------------+  SFJ       Full                                                             +---------+---------------+---------+-----------+----------+--------------+  FV Prox   Full                                                             +---------+---------------+---------+-----------+----------+--------------+  FV Mid    Full                                                              +---------+---------------+---------+-----------+----------+--------------+  FV Distal Full                                                             +---------+---------------+---------+-----------+----------+--------------+  PFV       Full                                                             +---------+---------------+---------+-----------+----------+--------------+  POP       Full            Yes       Yes                                    +---------+---------------+---------+-----------+----------+--------------+  PTV                                                        Not visualized  +---------+---------------+---------+-----------+----------+--------------+  PERO                                                       Not visualized  +---------+---------------+---------+-----------+----------+--------------+     Summary: Right: No evidence of common femoral vein obstruction. Left: There is no evidence of deep vein thrombosis in the lower extremity. However, portions of this examination were limited- see technologist comments above. No cystic structure found in the popliteal fossa.  *See table(s) above for measurements and observations.    Preliminary     Procedures Procedures (including critical care time)  Medications Ordered in ED Medications  cephALEXin (  KEFLEX) capsule 500 mg (has no administration in time range)     Initial Impression / Assessment and Plan / ED Course  I have reviewed the triage vital signs and the nursing notes.  Pertinent labs & imaging results that were available during my care of the patient were reviewed by me and considered in my medical decision making (see chart for details).       68 year old female with known history of lower extremity lymphedema, prior history of DVT and cellulitis comes in a chief complaint of unilateral left lower extremity swelling.  She is noted to have erythema, edema and calor.  She reports having fevers at home.  Exam is  consistent with cellulitis.  However, we will have to rule out a DVT as well.  Patient prefers getting the ultrasound while in the ED rather than getting it only if the antibiotic course does not resolve her symptoms.  Ultrasound DVT ordered. Labs reviewed.  She has elevated white count and slightly elevated creatinine. Results of the ER work-up have been discussed with the patient.  Anticipate that she will be discharged with close follow-up either in the ED or with PCP.  Patient has an appointment with her PCP later today.   1:55 PM  Results of the ED work-up discussed with the patient. She was made aware that if the ultrasound DVT is negative then she will have to follow-up with her PCP this week for recheck of her electrolytes and cellulitis.  She will return to the ER if her symptoms are getting worse.  Final Clinical Impressions(s) / ED Diagnoses   Final diagnoses:  Cellulitis of left lower extremity    ED Discharge Orders         Ordered    cephALEXin (KEFLEX) 500 MG capsule  4 times daily     07/30/19 1354           Varney Biles, MD 07/30/19 3735    Varney Biles, MD 07/30/19 1356

## 2019-07-30 NOTE — ED Notes (Signed)
Patient verbalizes understanding of discharge instructions. Opportunity for questioning and answers were provided. Armband removed by staff, pt discharged from ED.  

## 2019-07-30 NOTE — Discharge Instructions (Addendum)
We saw in the ER for leg swelling.  Ultrasound does not show any evidence of blood clot.  Please take antibiotics for presumed cellulitis.  Follow-up with your primary care doctor in 3 to 4 days for recheck.  If you are unable to secure an appointment then you can come to the ER for recheck of the cellulitis.  Return to the ER immediately with start having fevers, severe pain, the rash spreads onto your thighs.

## 2019-08-02 ENCOUNTER — Other Ambulatory Visit: Payer: Self-pay

## 2019-08-02 ENCOUNTER — Ambulatory Visit (HOSPITAL_COMMUNITY)
Admission: EM | Admit: 2019-08-02 | Discharge: 2019-08-02 | Disposition: A | Discharge status: Home / Self Care | Payer: Medicare HMO | Attending: Internal Medicine | Admitting: Internal Medicine

## 2019-08-02 ENCOUNTER — Encounter (HOSPITAL_COMMUNITY): Payer: Self-pay

## 2019-08-02 DIAGNOSIS — L03116 Cellulitis of left lower limb: Secondary | ICD-10-CM | POA: Diagnosis not present

## 2019-08-02 DIAGNOSIS — I890 Lymphedema, not elsewhere classified: Secondary | ICD-10-CM

## 2019-08-02 NOTE — ED Provider Notes (Signed)
[place]    CSN: [id number] Arrival date & time: 08/02/19  1334      History   Chief Complaint Chief Complaint  Patient presents with  . Leg Swelling    HPI Jessica Yates is a 68 y.o. female with a history of chronic lymphedema on diuretics, asthma-controlled comes to urgent care with complaints of left leg swelling with erythema.  Patient was seen in the emergency department and diagnosed with cellulitis.  She was prescribed cephalexin.  Patient comes to the urgent care to seek a second opinion regarding the adequacy of the antibiotic given for cellulitis.  Patient has no fever or chills.  Left leg continues to be swollen.  Patient is not elevating her legs like she should.  She has good urine output.  She does not weigh herself at home.  She denies any orthopnea paroxysmal nocturnal dyspnea.   HPI  Past Medical History:  Diagnosis Date  . Anemia 2002  . Anxiety   . Arthritis   . Asthma   . Blood clot in vein   . Cellulitis   . Cough   . Depression 02/06/2008  . DVT (deep venous thrombosis) ([place]) 2002   left leg  . Glaucoma   . Lymphedema   . Palpitations   . Peripheral venous insufficiency   . Seasonal allergies   . Sickle cell trait ([place])   . Vitamin D deficiency     Patient Active Problem List   Diagnosis Date Noted  . Cellulitis 05/13/2019  . Cellulitis of right lower extremity 05/12/2019  . CKD (chronic kidney disease) stage 3, GFR 30-59 ml/min 05/12/2019  . Normochromic normocytic anemia 05/12/2019  . Cellulitis, leg 05/12/2019  . Cellulitis and abscess of left leg 07/22/2018  . Cough 05/07/2016  . Upper airway cough syndrome 05/07/2016  . Sebaceous cyst of labia 04/19/2016  . OAB (overactive bladder) 04/19/2016  . 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 Lymphedema 05/03/2012  . CHF (congestive heart failure) ([place]) 04/16/2011  . SICKLE-CELL TRAIT 08/01/2007  . UNSPECIFIED GLAUCOMA 08/01/2007  . DVT  08/01/2007  . VENOUS INSUFFICIENCY 08/01/2007  . ALLERGIC RHINITIS 08/01/2007  . Extrinsic asthma 08/01/2007    Past Surgical History:  Procedure Laterality Date  . COSMETIC SURGERY  1975   nasal reconstruction  . TOTAL ABDOMINAL HYSTERECTOMY  07/2001    OB History    Gravida  0   Para  0   Term  0   Preterm  0   AB  0   Living  0     SAB  0   TAB  0   Ectopic  0   Multiple  0   Live Births  0            Home Medications    Prior to Admission medications   Medication Sig Start Date End Date Taking? Authorizing Provider  acetaminophen (TYLENOL) 500 MG tablet Take 1,000 mg by mouth every 6 (six) hours as needed for moderate pain.    [provider]  aspirin 81 MG tablet Take 81 mg by mouth daily.      [provider]  blood glucose meter kit and supplies KIT Dispense based on patient and insurance preference. Use up to four times daily as directed. (FOR ICD-9 250.00, 250.01). 05/20/19   Kayleen Memos, DO  cephALEXin (KEFLEX) 500 MG capsule Take 1 capsule (500 mg total) by mouth 4 (four) times daily. 07/30/19   Nanavati, Ankit,  MD  Cholecalciferol (VITAMIN D3) 25 MCG (1000 UT) CAPS Take 1 capsule (1,000 Units total) by mouth daily. 05/20/19   Kayleen Memos, DO  ferrous sulfate 325 (65 FE) MG tablet Take 325 mg by mouth daily with breakfast.    [provider]  furosemide (LASIX) 40 MG tablet '40mg'$  twice daily for 5 days, then '40mg'$  once daily. Patient taking differently: Take 40 mg by mouth daily.  07/28/18   Bonnielee Haff, MD  gabapentin (NEURONTIN) 100 MG capsule Take 1 capsule (100 mg total) by mouth 2 (two) times daily. 05/20/19   Kayleen Memos, DO  glipiZIDE (GLUCOTROL) 10 MG tablet TAKE ONE TABLET BY MOUTH ONCE DAILY BEFORE A MEAL 05/25/19   [provider]  ipratropium (ATROVENT) 0.03 % nasal spray Place 2 sprays into the nose daily as needed for rhinitis.  04/18/18   [provider]  omeprazole (PRILOSEC) 40 MG  capsule Take 40 mg by mouth daily. 05/01/19   [provider]  potassium chloride SA (K-DUR,KLOR-CON) 20 MEQ tablet Take 20 mEq by mouth daily.  04/27/16   [provider]  TRAVATAN Z 0.004 % SOLN ophthalmic solution Place 1 drop into both eyes At bedtime. 03/14/11   [provider]  triamcinolone cream (KENALOG) 0.1 % Apply 1 application topically daily as needed (for irritation).     [provider]  metFORMIN (GLUCOPHAGE) 500 MG tablet Take 1 tablet (500 mg total) by mouth 2 (two) times daily with a meal. Patient not taking: Reported on 06/14/2019 05/20/19 08/02/19  Kayleen Memos, DO  mirabegron ER (MYRBETRIQ) 25 MG TB24 tablet Take 1 tablet (25 mg total) by mouth daily. Patient taking differently: Take 25 mg by mouth daily as needed (overactive bladder).  04/19/16 08/02/19  Terrance Mass, MD    Family History Family History  Problem Relation Age of Onset  . Allergies Mother   . Asthma Mother   . Other Mother        enlarged heart  . Arthritis Mother   . Breast cancer Mother   . Diabetes Mother   . Hypertension Mother   . Cancer Mother        breast  . Hyperlipidemia Mother   . Heart disease Maternal Grandmother   . Breast cancer Maternal Grandmother   . Birth defects Maternal Grandmother        breast  . Cancer Father 46       leukemia  . Allergies Sister   . Arthritis Sister     Social History Social History   Tobacco Use  . Smoking status: Never Smoker  . Smokeless tobacco: Never Used  Substance Use Topics  . Alcohol use: No  . Drug use: No     Allergies   Patient has no known allergies.   Review of Systems Review of Systems  Constitutional: Negative.   Respiratory: Negative for chest tightness and shortness of breath.   Cardiovascular: Positive for leg swelling. Negative for chest pain.  Gastrointestinal: Negative for nausea and vomiting.  Genitourinary: Negative for dysuria.  Neurological: Negative for dizziness,  weakness and numbness.  Psychiatric/Behavioral: Negative.      Physical Exam Triage Vital Signs ED Triage Vitals  Enc Vitals Group     BP 08/02/19 1423 107/71     Pulse Rate 08/02/19 1423 90     Resp 08/02/19 1423 19     Temp 08/02/19 1423 98.3 F (36.8 C)     Temp Source 08/02/19 1423 Oral  SpO2 08/02/19 1423 100 %     Weight 08/02/19 1424 227 lb 12.8 oz (103.3 kg)     Height --      Head Circumference --      Peak Flow --      Pain Score 08/02/19 1422 6     Pain Loc --      Pain Edu? --      Excl. in [place]? --    No data found.  Updated Vital Signs BP 107/71 (BP Location: Right Arm)   Pulse 90   Temp 98.3 F (36.8 C) (Oral)   Resp 19   Wt 103.3 kg   SpO2 100%   BMI 35.15 kg/m   Visual Acuity Right Eye Distance:   Left Eye Distance:   Bilateral Distance:    Right Eye Near:   Left Eye Near:    Bilateral Near:     Physical Exam Constitutional:      General: She is not in acute distress.    Appearance: She is not ill-appearing or toxic-appearing.  Neck:     Musculoskeletal: Normal range of motion.  Cardiovascular:     Rate and Rhythm: Normal rate and regular rhythm.  Musculoskeletal:        General: Swelling present.     Comments: Bilateral lower extremity swelling.  Left leg swelling is greater than right leg.  Erythema on the left leg.  It has not progressed beyond the initial demarcated area.  Skin:    General: Skin is warm.     Capillary Refill: Capillary refill takes less than 2 seconds.     Findings: No bruising or erythema.  Neurological:     Mental Status: She is alert.      UC Treatments / Results  Labs (all labs ordered are listed, but only abnormal results are displayed) Labs Reviewed - No data to display  EKG   Radiology No results found.  Procedures Procedures (including critical care time)  Medications Ordered in UC Medications - No data to display  Initial Impression / Assessment and Plan / UC Course  I have reviewed  the triage vital signs and the nursing notes.  Pertinent labs & imaging results that were available during my care of the patient were reviewed by me and considered in my medical decision making (see chart for details).     1.  Cellulitis of the left leg: Patient is advised to continue Keflex Elevation of the lower extremities at least 4 hours a day and at nighttime recommended No further adjustments of the medications needed. Final Clinical Impressions(s) / UC Diagnoses   Final diagnoses:  Cellulitis of left lower extremity  Chronic acquired lymphedema   Discharge Instructions   None    ED Prescriptions    None     PDMP not reviewed this encounter.   Chase Picket, MD 08/04/19 2223

## 2019-08-02 NOTE — ED Triage Notes (Signed)
Pt. Is here with both of her legs swollen, she has a hx of lympdema. Her left leg is swollen beyond normal, she went to ED on Sunday. She is back today wanting stronger meds or another kind of meds, because her leg is still swollen.

## 2019-08-04 ENCOUNTER — Other Ambulatory Visit: Payer: Self-pay

## 2019-08-04 ENCOUNTER — Inpatient Hospital Stay (HOSPITAL_COMMUNITY)
Admission: EM | Admit: 2019-08-04 | Discharge: 2019-08-12 | DRG: 603 | Disposition: A | Discharge status: Home Health | Payer: Medicare HMO | Attending: Family Medicine | Admitting: Family Medicine

## 2019-08-04 ENCOUNTER — Ambulatory Visit (INDEPENDENT_AMBULATORY_CARE_PROVIDER_SITE_OTHER)
Admission: EM | Admit: 2019-08-04 | Discharge: 2019-08-04 | Disposition: A | Discharge status: Home / Self Care | Payer: Medicare HMO | Source: Home / Self Care

## 2019-08-04 ENCOUNTER — Encounter (HOSPITAL_COMMUNITY): Payer: Self-pay

## 2019-08-04 DIAGNOSIS — I872 Venous insufficiency (chronic) (peripheral): Secondary | ICD-10-CM | POA: Diagnosis present

## 2019-08-04 DIAGNOSIS — X58XXXA Exposure to other specified factors, initial encounter: Secondary | ICD-10-CM | POA: Diagnosis not present

## 2019-08-04 DIAGNOSIS — N183 Chronic kidney disease, stage 3 unspecified: Secondary | ICD-10-CM | POA: Diagnosis present

## 2019-08-04 DIAGNOSIS — Z79899 Other long term (current) drug therapy: Secondary | ICD-10-CM

## 2019-08-04 DIAGNOSIS — E440 Moderate protein-calorie malnutrition: Secondary | ICD-10-CM | POA: Diagnosis present

## 2019-08-04 DIAGNOSIS — Z833 Family history of diabetes mellitus: Secondary | ICD-10-CM

## 2019-08-04 DIAGNOSIS — L03116 Cellulitis of left lower limb: Principal | ICD-10-CM | POA: Diagnosis present

## 2019-08-04 DIAGNOSIS — J45909 Unspecified asthma, uncomplicated: Secondary | ICD-10-CM | POA: Diagnosis present

## 2019-08-04 DIAGNOSIS — N39490 Overflow incontinence: Secondary | ICD-10-CM | POA: Diagnosis present

## 2019-08-04 DIAGNOSIS — K219 Gastro-esophageal reflux disease without esophagitis: Secondary | ICD-10-CM | POA: Diagnosis present

## 2019-08-04 DIAGNOSIS — D509 Iron deficiency anemia, unspecified: Secondary | ICD-10-CM | POA: Diagnosis present

## 2019-08-04 DIAGNOSIS — Z8261 Family history of arthritis: Secondary | ICD-10-CM

## 2019-08-04 DIAGNOSIS — E876 Hypokalemia: Secondary | ICD-10-CM | POA: Diagnosis present

## 2019-08-04 DIAGNOSIS — Z825 Family history of asthma and other chronic lower respiratory diseases: Secondary | ICD-10-CM

## 2019-08-04 DIAGNOSIS — Z86718 Personal history of other venous thrombosis and embolism: Secondary | ICD-10-CM

## 2019-08-04 DIAGNOSIS — I890 Lymphedema, not elsewhere classified: Secondary | ICD-10-CM

## 2019-08-04 DIAGNOSIS — H409 Unspecified glaucoma: Secondary | ICD-10-CM | POA: Diagnosis present

## 2019-08-04 DIAGNOSIS — L0390 Cellulitis, unspecified: Secondary | ICD-10-CM | POA: Diagnosis present

## 2019-08-04 DIAGNOSIS — Z806 Family history of leukemia: Secondary | ICD-10-CM

## 2019-08-04 DIAGNOSIS — E1122 Type 2 diabetes mellitus with diabetic chronic kidney disease: Secondary | ICD-10-CM | POA: Diagnosis present

## 2019-08-04 DIAGNOSIS — Z7984 Long term (current) use of oral hypoglycemic drugs: Secondary | ICD-10-CM

## 2019-08-04 DIAGNOSIS — Z20828 Contact with and (suspected) exposure to other viral communicable diseases: Secondary | ICD-10-CM | POA: Diagnosis present

## 2019-08-04 DIAGNOSIS — E11649 Type 2 diabetes mellitus with hypoglycemia without coma: Secondary | ICD-10-CM | POA: Diagnosis not present

## 2019-08-04 DIAGNOSIS — Z8249 Family history of ischemic heart disease and other diseases of the circulatory system: Secondary | ICD-10-CM

## 2019-08-04 DIAGNOSIS — I5032 Chronic diastolic (congestive) heart failure: Secondary | ICD-10-CM | POA: Diagnosis present

## 2019-08-04 DIAGNOSIS — M79605 Pain in left leg: Secondary | ICD-10-CM | POA: Diagnosis not present

## 2019-08-04 DIAGNOSIS — Z90710 Acquired absence of both cervix and uterus: Secondary | ICD-10-CM

## 2019-08-04 DIAGNOSIS — Z6832 Body mass index (BMI) 32.0-32.9, adult: Secondary | ICD-10-CM

## 2019-08-04 DIAGNOSIS — D573 Sickle-cell trait: Secondary | ICD-10-CM | POA: Diagnosis present

## 2019-08-04 DIAGNOSIS — Z8349 Family history of other endocrine, nutritional and metabolic diseases: Secondary | ICD-10-CM

## 2019-08-04 DIAGNOSIS — Z7982 Long term (current) use of aspirin: Secondary | ICD-10-CM

## 2019-08-04 DIAGNOSIS — L03119 Cellulitis of unspecified part of limb: Secondary | ICD-10-CM | POA: Diagnosis present

## 2019-08-04 DIAGNOSIS — M79652 Pain in left thigh: Secondary | ICD-10-CM

## 2019-08-04 DIAGNOSIS — S7012XA Contusion of left thigh, initial encounter: Secondary | ICD-10-CM | POA: Diagnosis not present

## 2019-08-04 DIAGNOSIS — Z803 Family history of malignant neoplasm of breast: Secondary | ICD-10-CM

## 2019-08-04 LAB — CBC WITH DIFFERENTIAL/PLATELET
Abs Immature Granulocytes: 0.13 10*3/uL — ABNORMAL HIGH (ref 0.00–0.07)
Basophils Absolute: 0 10*3/uL (ref 0.0–0.1)
Basophils Relative: 0 %
Eosinophils Absolute: 0.3 10*3/uL (ref 0.0–0.5)
Eosinophils Relative: 3 %
HCT: 32 % — ABNORMAL LOW (ref 36.0–46.0)
Hemoglobin: 9.8 g/dL — ABNORMAL LOW (ref 12.0–15.0)
Immature Granulocytes: 1 %
Lymphocytes Relative: 17 %
Lymphs Abs: 1.6 10*3/uL (ref 0.7–4.0)
MCH: 24.9 pg — ABNORMAL LOW (ref 26.0–34.0)
MCHC: 30.6 g/dL (ref 30.0–36.0)
MCV: 81.4 fL (ref 80.0–100.0)
Monocytes Absolute: 0.5 10*3/uL (ref 0.1–1.0)
Monocytes Relative: 6 %
Neutro Abs: 6.9 10*3/uL (ref 1.7–7.7)
Neutrophils Relative %: 73 %
Platelets: 346 10*3/uL (ref 150–400)
RBC: 3.93 MIL/uL (ref 3.87–5.11)
RDW: 18.2 % — ABNORMAL HIGH (ref 11.5–15.5)
WBC: 9.4 10*3/uL (ref 4.0–10.5)
nRBC: 0 % (ref 0.0–0.2)

## 2019-08-04 LAB — COMPREHENSIVE METABOLIC PANEL
ALT: 16 U/L (ref 0–44)
AST: 14 U/L — ABNORMAL LOW (ref 15–41)
Albumin: 3.1 g/dL — ABNORMAL LOW (ref 3.5–5.0)
Alkaline Phosphatase: 98 U/L (ref 38–126)
Anion gap: 10 (ref 5–15)
BUN: 5 mg/dL — ABNORMAL LOW (ref 8–23)
CO2: 24 mmol/L (ref 22–32)
Calcium: 9.5 mg/dL (ref 8.9–10.3)
Chloride: 102 mmol/L (ref 98–111)
Creatinine, Ser: 0.92 mg/dL (ref 0.44–1.00)
GFR calc Af Amer: >60 mL/min (ref >60)
GFR calc non Af Amer: >60 mL/min (ref >60)
Glucose, Bld: 132 mg/dL — ABNORMAL HIGH (ref 70–99)
Potassium: 3.4 mmol/L — ABNORMAL LOW (ref 3.5–5.1)
Sodium: 136 mmol/L (ref 135–145)
Total Bilirubin: 0.6 mg/dL (ref 0.3–1.2)
Total Protein: 7.3 g/dL (ref 6.5–8.1)

## 2019-08-04 LAB — PROTIME-INR
INR: 0.9 (ref 0.8–1.2)
Prothrombin Time: 12.5 seconds (ref 11.4–15.2)

## 2019-08-04 LAB — LACTIC ACID, PLASMA: Lactic Acid, Venous: 1.7 mmol/L (ref 0.5–1.9)

## 2019-08-04 NOTE — ED Provider Notes (Addendum)
[place]    CSN: [id number] Arrival date & time: 08/04/19  1659      History   Chief Complaint Chief Complaint  Patient presents with  . Leg Pain    HPI Jessica Yates is a 68 y.o. female.   The history is provided by the patient.  Leg Pain Location:  Leg Injury: no   Leg location:  L lower leg Pain details:    Severity:  Severe   Onset quality:  Gradual   Duration:  1 week Chronicity:  New Dislocation: no   Relieved by:  Nothing Worsened by:  Nothing Risk factors: no known bone disorder   Pt reports she is taking Keflex for cellulitis of left leg.  Pt reports leg is now blistering.  Pt reports no increase in area of redness but more swelling.  Pt has line marked from day of starting antibiotics  Past Medical History:  Diagnosis Date  . Anemia 2002  . Anxiety   . Arthritis   . Asthma   . Blood clot in vein   . Cellulitis   . Cough   . Depression 02/06/2008  . DVT (deep venous thrombosis) ([place]) 2002   left leg  . Glaucoma   . Lymphedema   . Palpitations   . Peripheral venous insufficiency   . Seasonal allergies   . Sickle cell trait ([place])   . Vitamin D deficiency     Patient Active Problem List   Diagnosis Date Noted  . Cellulitis 05/13/2019  . Cellulitis of right lower extremity 05/12/2019  . CKD (chronic kidney disease) stage 3, GFR 30-59 ml/min 05/12/2019  . Normochromic normocytic anemia 05/12/2019  . Cellulitis, leg 05/12/2019  . Cellulitis and abscess of left leg 07/22/2018  . Cough 05/07/2016  . Upper airway cough syndrome 05/07/2016  . Sebaceous cyst of labia 04/19/2016  . OAB (overactive bladder) 04/19/2016  . 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 Lymphedema 05/03/2012  . CHF (congestive heart failure) ([place]) 04/16/2011  . SICKLE-CELL TRAIT 08/01/2007  . UNSPECIFIED GLAUCOMA 08/01/2007  . DVT 08/01/2007  . VENOUS INSUFFICIENCY 08/01/2007  . ALLERGIC RHINITIS 08/01/2007  . Extrinsic asthma  08/01/2007    Past Surgical History:  Procedure Laterality Date  . COSMETIC SURGERY  1975   nasal reconstruction  . TOTAL ABDOMINAL HYSTERECTOMY  07/2001    OB History    Gravida  0   Para  0   Term  0   Preterm  0   AB  0   Living  0     SAB  0   TAB  0   Ectopic  0   Multiple  0   Live Births  0            Home Medications    Prior to Admission medications   Medication Sig Start Date End Date Taking? Authorizing Provider  acetaminophen (TYLENOL) 500 MG tablet Take 1,000 mg by mouth every 6 (six) hours as needed for moderate pain.    [provider]  aspirin 81 MG tablet Take 81 mg by mouth daily.      [provider]  blood glucose meter kit and supplies KIT Dispense based on patient and insurance preference. Use up to four times daily as directed. (FOR ICD-9 250.00, 250.01). 05/20/19   Kayleen Memos, DO  cephALEXin (KEFLEX) 500 MG capsule Take 1 capsule (500 mg total) by mouth 4 (four) times daily. 07/30/19   Nanavati, Ankit,  MD  Cholecalciferol (VITAMIN D3) 25 MCG (1000 UT) CAPS Take 1 capsule (1,000 Units total) by mouth daily. 05/20/19   Kayleen Memos, DO  ferrous sulfate 325 (65 FE) MG tablet Take 325 mg by mouth daily with breakfast.    [provider]  furosemide (LASIX) 40 MG tablet '40mg'$  twice daily for 5 days, then '40mg'$  once daily. Patient taking differently: Take 40 mg by mouth daily.  07/28/18   Bonnielee Haff, MD  gabapentin (NEURONTIN) 100 MG capsule Take 1 capsule (100 mg total) by mouth 2 (two) times daily. 05/20/19   Kayleen Memos, DO  glipiZIDE (GLUCOTROL) 10 MG tablet TAKE ONE TABLET BY MOUTH ONCE DAILY BEFORE A MEAL 05/25/19   [provider]  ipratropium (ATROVENT) 0.03 % nasal spray Place 2 sprays into the nose daily as needed for rhinitis.  04/18/18   [provider]  omeprazole (PRILOSEC) 40 MG capsule Take 40 mg by mouth daily. 05/01/19   [provider]  potassium chloride SA  (K-DUR,KLOR-CON) 20 MEQ tablet Take 20 mEq by mouth daily.  04/27/16   [provider]  TRAVATAN Z 0.004 % SOLN ophthalmic solution Place 1 drop into both eyes At bedtime. 03/14/11   [provider]  triamcinolone cream (KENALOG) 0.1 % Apply 1 application topically daily as needed (for irritation).     [provider]  metFORMIN (GLUCOPHAGE) 500 MG tablet Take 1 tablet (500 mg total) by mouth 2 (two) times daily with a meal. Patient not taking: Reported on 06/14/2019 05/20/19 08/02/19  Kayleen Memos, DO  mirabegron ER (MYRBETRIQ) 25 MG TB24 tablet Take 1 tablet (25 mg total) by mouth daily. Patient taking differently: Take 25 mg by mouth daily as needed (overactive bladder).  04/19/16 08/02/19  Terrance Mass, MD    Family History Family History  Problem Relation Age of Onset  . Allergies Mother   . Asthma Mother   . Other Mother        enlarged heart  . Arthritis Mother   . Breast cancer Mother   . Diabetes Mother   . Hypertension Mother   . Cancer Mother        breast  . Hyperlipidemia Mother   . Heart disease Maternal Grandmother   . Breast cancer Maternal Grandmother   . Birth defects Maternal Grandmother        breast  . Cancer Father 80       leukemia  . Allergies Sister   . Arthritis Sister     Social History Social History   Tobacco Use  . Smoking status: Never Smoker  . Smokeless tobacco: Never Used  Substance Use Topics  . Alcohol use: No  . Drug use: No     Allergies   Patient has no known allergies.   Review of Systems Review of Systems  All other systems reviewed and are negative.    Physical Exam Triage Vital Signs ED Triage Vitals  Enc Vitals Group     BP      Pulse      Resp      Temp      Temp src      SpO2      Weight      Height      Head Circumference      Peak Flow      Pain Score      Pain Loc      Pain Edu?  Excl. in [place]?    No data found.  Updated Vital Signs There were no vitals taken for  this visit.  Visual Acuity Right Eye Distance:   Left Eye Distance:   Bilateral Distance:    Right Eye Near:   Left Eye Near:    Bilateral Near:     Physical Exam Vitals signs and nursing note reviewed.  Constitutional:      Appearance: She is well-developed.  HENT:     Head: Normocephalic.  Cardiovascular:     Rate and Rhythm: Normal rate.  Pulmonary:     Effort: Pulmonary effort is normal.  Abdominal:     General: There is no distension.  Musculoskeletal:        General: Swelling and tenderness present.     Comments: Swollen from just below knee to full foot, erythema, skin blistering   Neurological:     Mental Status: She is alert and oriented to person, place, and time.      UC Treatments / Results  Labs (all labs ordered are listed, but only abnormal results are displayed) Labs Reviewed - No data to display  EKG   Radiology No results found.  Procedures Procedures (including critical care time)  Medications Ordered in UC Medications - No data to display  Initial Impression / Assessment and Plan / UC Course  I have reviewed the triage vital signs and the nursing notes.  Pertinent labs & imaging results that were available during my care of the patient were reviewed by me and considered in my medical decision making (see chart for details).     MDM  Pt has cellulitis and lymphadema.  Pt on keflex.   Pt advised to go to EM for evaluation.  I suspect pt will need IV antibiotics Final Clinical Impressions(s) / UC Diagnoses   Final diagnoses:  Cellulitis of leg, left   Discharge Instructions   None    ED Prescriptions    None     PDMP not reviewed this encounter.  TO ED now   Fransico Meadow, PA-C 08/04/19 [place], PA-C 08/04/19 1801

## 2019-08-04 NOTE — ED Triage Notes (Signed)
Pt reports continued swelling in her left leg for lymphedema and cellulitis. Pt still taking keflex. Leg is very swollen, red. Pt sent here from UC for IV antibiotics.

## 2019-08-04 NOTE — ED Triage Notes (Addendum)
Pt c/o left lower leg lymphedema and cellulitis; has been treating since 07/29/19 with Keflex.  Today c/o blistering to left lower leg with weeping.  Left lower leg very edematous, tight, with redness. Triage eval per K. [place], [place] - discussed with pt need to go to ED.  Pt verbalized understanding.

## 2019-08-04 NOTE — ED Notes (Signed)
Pt's sister notified of pt need to go to ED.  Verbalized understanding.  Patient is being discharged from the Urgent [facility] and sent to the Emergency Department. Per K. Threasa Alpha, NP, patient is stable but in need of higher level of care due to need for IV abx to treat cellulitis not responding well to oral abx. Patient is aware and verbalizes understanding of plan of care.  Vitals:   08/04/19 1745  BP: 136/79  Pulse: (!) 109  Resp: 18  Temp: 98.6 F (37 C)  SpO2: 100%

## 2019-08-04 NOTE — ED Notes (Signed)
LLE DP pulse + via Doppler.

## 2019-08-05 ENCOUNTER — Encounter (HOSPITAL_COMMUNITY): Payer: Self-pay | Admitting: Internal Medicine

## 2019-08-05 ENCOUNTER — Encounter (HOSPITAL_COMMUNITY): Payer: Medicare HMO

## 2019-08-05 DIAGNOSIS — L03116 Cellulitis of left lower limb: Secondary | ICD-10-CM | POA: Diagnosis not present

## 2019-08-05 DIAGNOSIS — I890 Lymphedema, not elsewhere classified: Secondary | ICD-10-CM | POA: Diagnosis not present

## 2019-08-05 LAB — LACTIC ACID, PLASMA: Lactic Acid, Venous: 0.9 mmol/L (ref 0.5–1.9)

## 2019-08-05 LAB — SARS CORONAVIRUS 2 (TAT 6-24 HRS): SARS Coronavirus 2: NEGATIVE

## 2019-08-05 LAB — CBG MONITORING, ED
Glucose-Capillary: 153 mg/dL — ABNORMAL HIGH (ref 70–99)
Glucose-Capillary: 103 mg/dL — ABNORMAL HIGH (ref 70–99)

## 2019-08-05 LAB — GLUCOSE, CAPILLARY: Glucose-Capillary: 89 mg/dL (ref 70–99)

## 2019-08-05 MED ORDER — SODIUM CHLORIDE 0.9 % IV SOLN
2.0000 g | Freq: Every day | INTRAVENOUS | Status: DC
  Administered 2019-08-05 – 2019-08-06 (×2): 2 g via INTRAVENOUS
  Filled 2019-08-05 (×2): qty 20

## 2019-08-05 MED ORDER — DICLOFENAC SODIUM 1 % EX GEL: 2.0000 g | Freq: Four times a day (QID) | CUTANEOUS | Status: DC | PRN

## 2019-08-05 MED ORDER — VANCOMYCIN HCL 10 G IV SOLR
2000.0000 mg | Freq: Once | INTRAVENOUS | Status: AC
  Administered 2019-08-05: 2000 mg via INTRAVENOUS
  Filled 2019-08-05: qty 2000

## 2019-08-05 MED ORDER — ACETAMINOPHEN 650 MG RE SUPP: 650.0000 mg | Freq: Four times a day (QID) | RECTAL | Status: DC | PRN

## 2019-08-05 MED ORDER — POTASSIUM CHLORIDE CRYS ER 20 MEQ PO TBCR
20.0000 meq | EXTENDED_RELEASE_TABLET | Freq: Once | ORAL | Status: AC
  Administered 2019-08-05: 20 meq via ORAL
  Filled 2019-08-05: qty 1

## 2019-08-05 MED ORDER — FERROUS SULFATE 325 (65 FE) MG PO TABS
325.0000 mg | ORAL_TABLET | ORAL | Status: DC
  Administered 2019-08-06 – 2019-08-10 (×3): 325 mg via ORAL
  Filled 2019-08-05 (×3): qty 1

## 2019-08-05 MED ORDER — INSULIN ASPART 100 UNIT/ML ~~LOC~~ SOLN: 0.0000 [IU] | Freq: Every day | SUBCUTANEOUS | Status: DC

## 2019-08-05 MED ORDER — INSULIN ASPART 100 UNIT/ML ~~LOC~~ SOLN
0.0000 [IU] | Freq: Three times a day (TID) | SUBCUTANEOUS | Status: DC
  Administered 2019-08-05: 1 [IU] via SUBCUTANEOUS

## 2019-08-05 MED ORDER — ACETAMINOPHEN 325 MG PO TABS
650.0000 mg | ORAL_TABLET | Freq: Four times a day (QID) | ORAL | Status: DC | PRN
  Administered 2019-08-06 – 2019-08-12 (×5): 650 mg via ORAL
  Filled 2019-08-05 (×5): qty 2

## 2019-08-05 MED ORDER — POTASSIUM CHLORIDE CRYS ER 20 MEQ PO TBCR
20.0000 meq | EXTENDED_RELEASE_TABLET | Freq: Every day | ORAL | Status: DC
  Administered 2019-08-05 – 2019-08-12 (×8): 20 meq via ORAL
  Filled 2019-08-05 (×9): qty 1

## 2019-08-05 MED ORDER — POTASSIUM CHLORIDE CRYS ER 20 MEQ PO TBCR
40.0000 meq | EXTENDED_RELEASE_TABLET | Freq: Once | ORAL | Status: AC
  Administered 2019-08-05: 40 meq via ORAL
  Filled 2019-08-05: qty 2

## 2019-08-05 MED ORDER — FLUTICASONE PROPIONATE 50 MCG/ACT NA SUSP
1.0000 | Freq: Every day | NASAL | Status: DC | PRN
  Filled 2019-08-05: qty 16

## 2019-08-05 MED ORDER — IPRATROPIUM BROMIDE 0.06 % NA SOLN: 2.0000 | Freq: Every day | NASAL | Status: DC | PRN

## 2019-08-05 MED ORDER — FUROSEMIDE 10 MG/ML IJ SOLN
20.0000 mg | Freq: Every day | INTRAMUSCULAR | Status: DC
  Administered 2019-08-05 – 2019-08-09 (×5): 20 mg via INTRAVENOUS
  Filled 2019-08-05 (×5): qty 2

## 2019-08-05 MED ORDER — GLIPIZIDE 5 MG PO TABS
10.0000 mg | ORAL_TABLET | Freq: Every day | ORAL | Status: DC
  Administered 2019-08-05 – 2019-08-07 (×3): 10 mg via ORAL
  Filled 2019-08-05: qty 2
  Filled 2019-08-05: qty 1
  Filled 2019-08-05: qty 2
  Filled 2019-08-05: qty 1

## 2019-08-05 MED ORDER — FAMOTIDINE 20 MG PO TABS
20.0000 mg | ORAL_TABLET | Freq: Two times a day (BID) | ORAL | Status: DC
  Administered 2019-08-05 – 2019-08-12 (×15): 20 mg via ORAL
  Filled 2019-08-05 (×15): qty 1

## 2019-08-05 MED ORDER — VANCOMYCIN HCL 10 G IV SOLR
1250.0000 mg | INTRAVENOUS | Status: DC
  Administered 2019-08-06: 1250 mg via INTRAVENOUS
  Filled 2019-08-05 (×2): qty 1250

## 2019-08-05 MED ORDER — PANTOPRAZOLE SODIUM 40 MG PO TBEC
40.0000 mg | DELAYED_RELEASE_TABLET | Freq: Every day | ORAL | Status: DC
  Administered 2019-08-05 – 2019-08-12 (×8): 40 mg via ORAL
  Filled 2019-08-05 (×8): qty 1

## 2019-08-05 MED ORDER — ASPIRIN EC 81 MG PO TBEC
81.0000 mg | DELAYED_RELEASE_TABLET | Freq: Every day | ORAL | Status: DC
  Administered 2019-08-05 – 2019-08-12 (×8): 81 mg via ORAL
  Filled 2019-08-05 (×8): qty 1

## 2019-08-05 MED ORDER — ENOXAPARIN SODIUM 40 MG/0.4ML ~~LOC~~ SOLN
40.0000 mg | Freq: Every day | SUBCUTANEOUS | Status: DC
  Administered 2019-08-05 – 2019-08-08 (×4): 40 mg via SUBCUTANEOUS
  Filled 2019-08-05 (×4): qty 0.4

## 2019-08-05 MED ORDER — MIRABEGRON ER 25 MG PO TB24
25.0000 mg | ORAL_TABLET | Freq: Every day | ORAL | Status: DC | PRN
  Filled 2019-08-05: qty 1

## 2019-08-05 MED ORDER — TRAMADOL HCL 50 MG PO TABS
50.0000 mg | ORAL_TABLET | Freq: Every day | ORAL | Status: DC | PRN
  Administered 2019-08-05: 50 mg via ORAL
  Filled 2019-08-05: qty 1

## 2019-08-05 MED ORDER — LATANOPROST 0.005 % OP SOLN
1.0000 [drp] | Freq: Every day | OPHTHALMIC | Status: DC
  Administered 2019-08-05 – 2019-08-11 (×7): 1 [drp] via OPHTHALMIC
  Filled 2019-08-05: qty 2.5

## 2019-08-05 MED ORDER — PRO-STAT SUGAR FREE PO LIQD
30.0000 mL | Freq: Two times a day (BID) | ORAL | Status: DC
  Administered 2019-08-05 – 2019-08-12 (×15): 30 mL via ORAL
  Filled 2019-08-05 (×16): qty 30

## 2019-08-05 NOTE — Progress Notes (Signed)
Brief Narrative: 68 y.o. female,  history of chronic lymphedema, diastolic dysfunction ? CHF,  history of DVT of the left lower extremity, Anemia, apparently recently seen in ED 11/16, and 11/19 for redness of the left distal lower ext apparently tx with keflex was concerned due to her left distal lower ext swelling and redness not improving.  Pt denies fever, chills, cp, palp, sob, n/v, diarrhea, brbpr. Blood culture x2 pending. Pt given vanco iv in ED. Amitted for cellulitis failing outpatient therapy.   Assessment and Plan: Reviewed chart. Cont current plan.  Principal Problem:   Cellulitis Active Problems:   Lymphedema   Cellulitis, leg  Cellulitis, refractory to oral abx Blood culture x2 --> Pending  Check lower extremity ultrasound, please follow up --> Negative but there are limitation due to Pt's body habitus. Similar findings and condition noted in her previous US about 3 months ago.  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

## 2019-08-05 NOTE — ED Notes (Signed)
ED TO INPATIENT HANDOFF REPORT  ED Nurse Name and Phone #: .  S Name/Age/Gender Jessica Yates 68 y.o. female Room/Bed: 028C/028C  Code Status   Code Status: Full Code  Home/SNF/Other Home Patient oriented to: self, place, time and situation Is this baseline? Yes   Triage Complete: Triage complete  Chief Complaint Swelling in Legs  Triage Note Pt reports continued swelling in her left leg for lymphedema and cellulitis. Pt still taking keflex. Leg is very swollen, red. Pt sent here from UC for IV antibiotics.    Allergies Allergies  Allergen Reactions  . Other Other (See Comments)    Diet is low in sodium and low in sugar    Level of Care/Admitting Diagnosis ED Disposition    ED Disposition Condition [facility] Area: [place] [[id number]]  Level of Care: Med-Surg [16]  I expect the patient will be discharged within 24 hours: No (not a candidate for 5C-Observation unit)  Covid Evaluation: Asymptomatic Screening Protocol (No Symptoms)  Diagnosis: Cellulitis [id number]  Admitting Physician: Jani Gravel [[id number]]  Attending Physician: Jani Gravel [[id number]]  PT Class (Do Not Modify): Observation [104]  PT Acc Code (Do Not Modify): Observation [10022]       B Medical/Surgery History Past Medical History:  Diagnosis Date  . Anemia 2002  . Anxiety   . Arthritis   . Asthma   . Blood clot in vein   . Cellulitis   . Cough   . Depression 02/06/2008  . DVT (deep venous thrombosis) ([place]) 2002   left leg  . Glaucoma   . Lymphedema   . Palpitations   . Peripheral venous insufficiency   . Seasonal allergies   . Sickle cell trait ([facility])   . Vitamin D deficiency    Past Surgical History:  Procedure Laterality Date  . COSMETIC SURGERY  1975   nasal reconstruction  . TOTAL ABDOMINAL HYSTERECTOMY  07/2001     A IV Location/Drains/Wounds Patient Lines/Drains/Airways Status   Active Line/Drains/Airways    Name:   Placement date:    Placement time:   Site:   Days:   Peripheral IV 08/05/19 Right;Anterior Forearm   08/05/19    0842    Forearm   less than 1          Intake/Output Last 24 hours  Intake/Output Summary (Last 24 hours) at 08/05/2019 1520 Last data filed at 08/05/2019 1310 Gross per 24 hour  Intake 600 ml  Output 2350 ml  Net -1750 ml    Labs/Imaging Results for orders placed or performed during the hospital encounter of 08/04/19 (from the past 48 hour(s))  Comprehensive metabolic panel     Status: Abnormal   Collection Time: 08/04/19  6:53 PM  Result Value Ref Range   Sodium 136 135 - 145 mmol/L   Potassium 3.4 (L) 3.5 - 5.1 mmol/L   Chloride 102 98 - 111 mmol/L   CO2 24 22 - 32 mmol/L   Glucose, Bld 132 (H) 70 - 99 mg/dL   BUN 5 (L) 8 - 23 mg/dL   Creatinine, Ser 0.92 0.44 - 1.00 mg/dL   Calcium 9.5 8.9 - 10.3 mg/dL   Total Protein 7.3 6.5 - 8.1 g/dL   Albumin 3.1 (L) 3.5 - 5.0 g/dL   AST 14 (L) 15 - 41 U/L   ALT 16 0 - 44 U/L   Alkaline Phosphatase 98 38 - 126 U/L   Total Bilirubin 0.6 0.3 - 1.2 mg/dL  GFR calc non Af Amer >60 >60 mL/min   GFR calc Af Amer >60 >60 mL/min   Anion gap 10 5 - 15    Comment: Performed at [facility] [address] [place], [place] [address]  Lactic acid, plasma     Status: None   Collection Time: 08/04/19  6:53 PM  Result Value Ref Range   Lactic Acid, Venous 1.7 0.5 - 1.9 mmol/L    Comment: Performed at [facility] [address], [place], [place] [address]  CBC with Differential     Status: Abnormal   Collection Time: 08/04/19  6:53 PM  Result Value Ref Range   WBC 9.4 4.0 - 10.5 K/uL   RBC 3.93 3.87 - 5.11 MIL/uL   Hemoglobin 9.8 (L) 12.0 - 15.0 g/dL   HCT 32.0 (L) 36.0 - 46.0 %   MCV 81.4 80.0 - 100.0 fL   MCH 24.9 (L) 26.0 - 34.0 pg   MCHC 30.6 30.0 - 36.0 g/dL   RDW 18.2 (H) 11.5 - 15.5 %   Platelets 346 150 - 400 K/uL   nRBC 0.0 0.0 - 0.2 %   Neutrophils Relative % 73 %   Neutro Abs 6.9 1.7 - 7.7 K/uL   Lymphocytes  Relative 17 %   Lymphs Abs 1.6 0.7 - 4.0 K/uL   Monocytes Relative 6 %   Monocytes Absolute 0.5 0.1 - 1.0 K/uL   Eosinophils Relative 3 %   Eosinophils Absolute 0.3 0.0 - 0.5 K/uL   Basophils Relative 0 %   Basophils Absolute 0.0 0.0 - 0.1 K/uL   Immature Granulocytes 1 %   Abs Immature Granulocytes 0.13 (H) 0.00 - 0.07 K/uL    Comment: Performed at [facility] [address], [place], [place] [address]  Protime-INR     Status: None   Collection Time: 08/04/19  6:53 PM  Result Value Ref Range   Prothrombin Time 12.5 11.4 - 15.2 seconds   INR 0.9 0.8 - 1.2    Comment: (NOTE) INR goal varies based on device and disease states. Performed at [facility], [place] [address] [place], [place] [address]   Culture, blood (Routine x 2)     Status: None (Preliminary result)   Collection Time: 08/04/19  7:06 PM   Specimen: BLOOD  Result Value Ref Range   Specimen Description BLOOD LEFT ANTECUBITAL    Special Requests      BOTTLES DRAWN AEROBIC AND ANAEROBIC Blood Culture results may not be optimal due to an inadequate volume of blood received in culture bottles   Culture      NO GROWTH < 24 HOURS Performed at [facility] [address], [place], [place] [address]    Report Status PENDING   SARS CORONAVIRUS 2 (TAT 6-24 HRS) Nasopharyngeal Nasopharyngeal Swab     Status: None   Collection Time: 08/05/19  5:00 AM   Specimen: Nasopharyngeal Swab  Result Value Ref Range   SARS Coronavirus 2 NEGATIVE NEGATIVE    Comment: (NOTE) SARS-CoV-2 target nucleic acids are NOT DETECTED. The SARS-CoV-2 RNA is generally detectable in upper and lower respiratory specimens during the acute phase of infection. Negative results do not preclude SARS-CoV-2 infection, do not rule out co-infections with other pathogens, and should not be used as the sole basis for treatment or other patient management decisions. Negative results must be combined with clinical observations, patient  history, and epidemiological information. The expected result is Negative.  Fact Sheet for Patients: SugarRoll.be Fact Sheet for Healthcare Providers: https://www.woods-mathews.com/ This test is not yet approved or cleared by the [place] FDA and  has been authorized for detection and/or diagnosis of SARS-CoV-2 by FDA under an Emergency Use Authorization (EUA). This EUA will remain  in effect (meaning this test can be used) for the duration of the COVID-19 declaration under Section 56 4(b)(1) of the Act, 21 U.S.C. section 360bbb-3(b)(1), unless the authorization is terminated or revoked sooner. Performed at [facility], [place] [address] [place] [address] [place], [place] [address]   Lactic acid, plasma     Status: None   Collection Time: 08/05/19  6:04 AM  Result Value Ref Range   Lactic Acid, Venous 0.9 0.5 - 1.9 mmol/L    Comment: Performed at [facility] [address] [place], [place] [address]  CBG monitoring, ED     Status: Abnormal   Collection Time: 08/05/19  9:32 AM  Result Value Ref Range   Glucose-Capillary 153 (H) 70 - 99 mg/dL  CBG monitoring, ED     Status: Abnormal   Collection Time: 08/05/19 12:00 PM  Result Value Ref Range   Glucose-Capillary 103 (H) 70 - 99 mg/dL   No results found.  Pending Labs Unresulted Labs (From admission, onward)    Start     Ordered   08/12/19 0500  Creatinine, serum  (enoxaparin (LOVENOX)    CrCl >/= 30 ml/min)  Weekly,   R    Comments: while on enoxaparin therapy    08/05/19 0507   08/06/19 0500  HIV Antibody (routine testing w rflx)  (HIV Antibody (Routine testing w reflex) panel)  Tomorrow morning,   R     08/05/19 0507   08/06/19 0500  Comprehensive metabolic panel  Tomorrow morning,   R     08/05/19 0507   08/06/19 0500  CBC  Tomorrow morning,   R     08/05/19 0507   08/04/19 1841  Culture, blood (Routine x 2)  BLOOD CULTURE X 2,   STAT     08/04/19 1840           Vitals/Pain Today's Vitals   08/05/19 1300 08/05/19 1315 08/05/19 1330 08/05/19 1345  BP: 107/69 128/80 (!) 108/96 124/73  Pulse:      Resp: 18 19 19 18   Temp:      TempSrc:      SpO2:      Weight:      Height:      PainSc:        Isolation Precautions No active isolations  Medications Medications  enoxaparin (LOVENOX) injection 40 mg (40 mg Subcutaneous Given 08/05/19 0934)  acetaminophen (TYLENOL) tablet 650 mg (has no administration in time range)    Or  acetaminophen (TYLENOL) suppository 650 mg (has no administration in time range)  insulin aspart (novoLOG) injection 0-6 Units (0 Units Subcutaneous Not Given 08/05/19 1200)  insulin aspart (novoLOG) injection 0-5 Units (has no administration in time range)  aspirin EC tablet 81 mg (81 mg Oral Given 08/05/19 0943)  traMADol (ULTRAM) tablet 50 mg (has no administration in time range)  glipiZIDE (GLUCOTROL) tablet 10 mg (10 mg Oral Given 08/05/19 1201)  famotidine (PEPCID) tablet 20 mg (20 mg Oral Given 08/05/19 0924)  pantoprazole (PROTONIX) EC tablet 40 mg (40 mg Oral Given 08/05/19 0926)  mirabegron ER (MYRBETRIQ) tablet 25 mg (has no administration in time range)  ferrous sulfate tablet 325 mg (has no administration in time  range)  potassium chloride SA (KLOR-CON) CR tablet 20 mEq (20 mEq Oral Given 08/05/19 0925)  fluticasone (FLONASE) 50 MCG/ACT nasal spray 1-2 spray (has no administration in time range)  ipratropium (ATROVENT) 0.06 % nasal spray 2 spray (has no administration in time range)  diclofenac Sodium (VOLTAREN) 1 % topical gel 2-4 g (has no administration in time range)  latanoprost (XALATAN) 0.005 % ophthalmic solution 1 drop (has no administration in time range)  furosemide (LASIX) injection 20 mg (20 mg Intravenous Given 08/05/19 0922)  cefTRIAXone (ROCEPHIN) 2 g in sodium chloride 0.9 % 100 mL IVPB (0 g Intravenous Stopped 08/05/19 1025)  feeding supplement (PRO-STAT SUGAR FREE 64) liquid 30 mL (30  mLs Oral Given 08/05/19 1202)  vancomycin (VANCOCIN) 1,250 mg in sodium chloride 0.9 % 250 mL IVPB (has no administration in time range)  vancomycin (VANCOCIN) 2,000 mg in sodium chloride 0.9 % 500 mL IVPB (0 mg Intravenous Stopped 08/05/19 1233)  potassium chloride SA (KLOR-CON) CR tablet 20 mEq (20 mEq Oral Given 08/05/19 1209)  potassium chloride SA (KLOR-CON) CR tablet 40 mEq (40 mEq Oral Given 08/05/19 0925)    Mobility walks with device Low fall risk   Focused Assessments Cardiac Assessment Handoff:    No results found for: CKTOTAL, CKMB, CKMBINDEX, TROPONINI Lab Results  Component Value Date   DDIMER 1.74 (H) 07/22/2018   Does the Patient currently have chest pain? No     R Recommendations: See Admitting Provider Note  Report given to:   Additional Notes: .

## 2019-08-05 NOTE — ED Notes (Addendum)
Pt arrived to Rm 51 via stretcher. Pt alert, oriented. Pt asking for multiple items - given. Pt asking for juice - advised pt may have Sprite Zero d/t diabetic - given.

## 2019-08-05 NOTE — ED Notes (Signed)
Attempted to call report to 5N - was requested to call back in 5 min.

## 2019-08-05 NOTE — Progress Notes (Signed)
Pharmacy Antibiotic Note  Jessica Yates is a 68 y.o. female admitted on 08/04/2019 with LLE cellulitis.  Pharmacy has been consulted for Vancomycin dosing. Also on ceftriaxone. Received vancomycin 2g IV loading dose. SCr 0.92 on admit.  Plan: Change vancomycin to 1250 mg IV Q 24 hrs. Goal AUC 400-550. Expected AUC: 454 SCr used: 0.92 Continue ceftriaxone 2g IV q24h Monitor clinical progress, c/s, renal function F/u de-escalation plan/LOT, vancomycin levels as indicated   Height: 5\' 9"  (175.3 cm) Weight: 220 lb (99.8 kg) IBW/kg (Calculated) : 66.2  Temp (24hrs), Avg:98.5 F (36.9 C), Min:98 F (36.7 C), Max:98.8 F (37.1 C)  Recent Labs  Lab 07/29/19 2119 08/04/19 1853 08/05/19 0604  WBC 15.6* 9.4  --   CREATININE 1.55* 0.92  --   LATICACIDVEN 2.0* 1.7 0.9    Estimated Creatinine Clearance: 73.5 mL/min (by C-G formula based on SCr of 0.92 mg/dL).    Allergies  Allergen Reactions  . Other Other (See Comments)    Diet is low in sodium and low in sugar    Elicia Lamp, PharmD, BCPS Please check AMION for all [place] contact numbers Clinical Pharmacist 08/05/2019 1:19 PM

## 2019-08-05 NOTE — H&P (Addendum)
TRH H&P    Patient Demographics:    Jessica Yates, is a 68 y.o. female  MRN: [id number]  DOB - [birth year]  Admit Date - 08/04/2019  Referring MD/NP/PA:   Montine Circle  Outpatient Primary MD for the patient is Sonia Side., FNP  Patient coming from:  home  Chief complaint-  cellulitis   HPI:    Jessica Yates  is a 68 y.o. female,  history of chronic lymphedema, diastolic dysfunction ? CHF,  history of DVT of the left lower extremity, Anemia, apparently recently seen in ED 11/16, and 11/19 for redness of the left distal lower ext apparently tx with keflex was concerned due to her left distal lower ext swelling and redness not improving.  Pt denies fever, chills, cp, palp, sob, n/v, diarrhea, brbpr.   In Ed,  T 98.8 P 98 R 16, Bp 134/82  Pox 98% on RA Wt 99.8kg  Wbc 9.4, Hgb 9.8, Plt 346 INR 0.9  Na 136, K 3.4, Bun 5, Creatinine 0.92 Ast 14, Alt 16  Blood culture x2 pending  Pt given vanco iv in ED  ED requests that patient be admitted for cellulitis failing outpatient therapy.      Review of systems:    In addition to the HPI above,  No Fever-chills, No Headache, No changes with Vision or hearing, No problems swallowing food or Liquids, No Chest pain, Cough or Shortness of Breath, No Abdominal pain, No Nausea or Vomiting, bowel movements are regular, No Blood in stool or Urine, No dysuria,  No new joints pains-aches,  No new weakness, tingling, numbness in any extremity, No recent weight gain or loss, No polyuria, polydypsia or polyphagia, No significant Mental Stressors.  All other systems reviewed and are negative.    Past History of the following :    Past Medical History:  Diagnosis Date  . Anemia 2002  . Anxiety   . Arthritis   . Asthma   . Blood clot in vein   . Cellulitis   . Cough   . Depression 02/06/2008  . DVT (deep venous thrombosis) ([place] [facility]) 2002   left leg  . Glaucoma   . Lymphedema   . Palpitations   . Peripheral venous insufficiency   . Seasonal allergies   . Sickle cell trait ([facility])   . Vitamin D deficiency       Past Surgical History:  Procedure Laterality Date  . COSMETIC SURGERY  1975   nasal reconstruction  . TOTAL ABDOMINAL HYSTERECTOMY  07/2001      Social History:      Social History   Tobacco Use  . Smoking status: Never Smoker  . Smokeless tobacco: Never Used  Substance Use Topics  . Alcohol use: No       Family History :     Family History  Problem Relation Age of Onset  . Allergies Mother   . Asthma Mother   . Other Mother        enlarged heart  . Arthritis Mother   . Breast  cancer Mother   . Diabetes Mother   . Hypertension Mother   . Cancer Mother        breast  . Hyperlipidemia Mother   . Heart disease Maternal Grandmother   . Breast cancer Maternal Grandmother   . Birth defects Maternal Grandmother        breast  . Cancer Father 24       leukemia  . Allergies Sister   . Arthritis Sister        Home Medications:   Prior to Admission medications   Medication Sig Start Date End Date Taking? Authorizing Provider  acetaminophen (TYLENOL) 500 MG tablet Take 1,000 mg by mouth every 6 (six) hours as needed for mild pain.    Yes [provider]  aspirin 81 MG tablet Take 81 mg by mouth daily.     Yes [provider]  cephALEXin (KEFLEX) 500 MG capsule Take 1 capsule (500 mg total) by mouth 4 (four) times daily. Patient taking differently: Take 500 mg by mouth 4 (four) times daily. FOR 7 DAYS 07/30/19  Yes Varney Biles, MD  Cholecalciferol (VITAMIN D3) 25 MCG (1000 UT) CAPS Take 1 capsule (1,000 Units total) by mouth daily. Patient taking differently: Take 1,000 Units by mouth 3 (three) times a week.  05/20/19  Yes Irene Pap N, DO  diclofenac Sodium (VOLTAREN) 1 % GEL Apply 2-4 g topically 4 (four) times daily as needed (for pain).   Yes [provider]   famotidine (PEPCID) 20 MG tablet Take 20 mg by mouth 2 (two) times daily. 07/25/19  Yes [provider]  ferrous sulfate 325 (65 FE) MG tablet Take 325 mg by mouth 3 (three) times a week.    Yes [provider]  fluticasone (FLONASE) 50 MCG/ACT nasal spray Place 1-2 sprays into both nostrils daily as needed for allergies or rhinitis.  07/25/19  Yes [provider]  furosemide (LASIX) 40 MG tablet '40mg'$  twice daily for 5 days, then '40mg'$  once daily. Patient taking differently: Take 40 mg by mouth daily.  07/28/18  Yes Bonnielee Haff, MD  glipiZIDE (GLUCOTROL) 10 MG tablet Take 10 mg by mouth daily before breakfast.  05/25/19  Yes [provider]  mirabegron ER (MYRBETRIQ) 25 MG TB24 tablet Take 25 mg by mouth daily as needed (for urinary issues).   Yes [provider]  potassium chloride SA (K-DUR,KLOR-CON) 20 MEQ tablet Take 20 mEq by mouth daily.  04/27/16  Yes [provider]  traMADol (ULTRAM) 50 MG tablet Take 50 mg by mouth daily as needed (for pain).   Yes [provider]  TRAVATAN Z 0.004 % SOLN ophthalmic solution Place 1 drop into both eyes at bedtime.  03/14/11  Yes [provider]  triamcinolone cream (KENALOG) 0.1 % Apply 1 application topically daily as needed (to affected, irritated sites).    Yes [provider]  blood glucose meter kit and supplies KIT Dispense based on patient and insurance preference. Use up to four times daily as directed. (FOR ICD-9 250.00, 250.01). 05/20/19   Kayleen Memos, DO  gabapentin (NEURONTIN) 100 MG capsule Take 1 capsule (100 mg total) by mouth 2 (two) times daily. Patient not taking: Reported on 08/05/2019 05/20/19   Kayleen Memos, DO  ipratropium (ATROVENT) 0.03 % nasal spray Place 2 sprays into the nose daily as needed for rhinitis.  04/18/18   [provider]  omeprazole (PRILOSEC) 40 MG capsule Take 40 mg by mouth daily. 05/01/19  [provider]  metFORMIN  (GLUCOPHAGE) 500 MG tablet Take 1 tablet (500 mg total) by mouth 2 (two) times daily with a meal. Patient not taking: Reported on 06/14/2019 05/20/19 08/02/19  Kayleen Memos, DO     Allergies:     Allergies  Allergen Reactions  . Other Other (See Comments)    Diet is low in sodium and low in sugar     Physical Exam:   Vitals  Blood pressure 139/85, pulse 88, temperature 98 F (36.7 C), temperature source Oral, resp. rate 19, height '5\' 9"'$  (1.753 m), weight 99.8 kg, SpO2 100 %.  1.  General: axoxo3  2. Psychiatric: euthymic  3. Neurologic: nonfocal  4. HEENMT:  Anicteric, pupils 1.19m symmetric, direct, consensual, near intact Neck: no jvd  5. Respiratory : CTAB  6. Cardiovascular : rrr s1 s2, no m/g/r  7. Gastrointestinal:  Abd: soft, nt, n,d, +bs  8. Skin:  Ext: no c/c,  1-2+ edema of the left distal lower ext, slight warmth and redness.  Up to the knee.   9.Musculoskeletal:  Good ROM    Data Review:    CBC Recent Labs  Lab 07/29/19 2119 08/04/19 1853  WBC 15.6* 9.4  HGB 10.5* 9.8*  HCT 32.7* 32.0*  PLT 212 346  MCV 79.8* 81.4  MCH 25.6* 24.9*  MCHC 32.1 30.6  RDW 17.7* 18.2*  LYMPHSABS 0.7 1.6  MONOABS 0.5 0.5  EOSABS 0.0 0.3  BASOSABS 0.0 0.0   ------------------------------------------------------------------------------------------------------------------  Results for orders placed or performed during the hospital encounter of 08/04/19 (from the past 48 hour(s))  Comprehensive metabolic panel     Status: Abnormal   Collection Time: 08/04/19  6:53 PM  Result Value Ref Range   Sodium 136 135 - 145 mmol/L   Potassium 3.4 (L) 3.5 - 5.1 mmol/L   Chloride 102 98 - 111 mmol/L   CO2 24 22 - 32 mmol/L   Glucose, Bld 132 (H) 70 - 99 mg/dL   BUN 5 (L) 8 - 23 mg/dL   Creatinine, Ser 0.92 0.44 - 1.00 mg/dL   Calcium 9.5 8.9 - 10.3 mg/dL   Total Protein 7.3 6.5 - 8.1 g/dL   Albumin 3.1 (L) 3.5 - 5.0 g/dL   AST 14 (L) 15 - 41 U/L   ALT 16 0 -  44 U/L   Alkaline Phosphatase 98 38 - 126 U/L   Total Bilirubin 0.6 0.3 - 1.2 mg/dL   GFR calc non Af Amer >60 >60 mL/min   GFR calc Af Amer >60 >60 mL/min   Anion gap 10 5 - 15    Comment: Performed at [facility] [address], [id number] Lactic acid, plasma     Status: None   Collection Time: 08/04/19  6:53 PM  Result Value Ref Range   Lactic Acid, Venous 1.7 0.5 - 1.9 mmol/L    Comment: Performed at [id number] [place] [id number] CBC with Differential     Status: Abnormal   Collection Time: 08/04/19  6:53 PM  Result Value Ref Range   WBC 9.4 4.0 - 10.5 K/uL   RBC 3.93 3.87 - 5.11 MIL/uL   Hemoglobin 9.8 (L) 12.0 - 15.0 g/dL   HCT 32.0 (L) 36.0 - 46.0 %   MCV 81.4 80.0 - 100.0 fL   MCH 24.9 (L) 26.0 - 34.0 pg   MCHC 30.6 30.0 - 36.0 g/dL   RDW 18.2 (  H) 11.5 - 15.5 %   Platelets 346 150 - 400 K/uL   nRBC 0.0 0.0 - 0.2 %   Neutrophils Relative % 73 %   Neutro Abs 6.9 1.7 - 7.7 K/uL   Lymphocytes Relative 17 %   Lymphs Abs 1.6 0.7 - 4.0 K/uL   Monocytes Relative 6 %   Monocytes Absolute 0.5 0.1 - 1.0 K/uL   Eosinophils Relative 3 %   Eosinophils Absolute 0.3 0.0 - 0.5 K/uL   Basophils Relative 0 %   Basophils Absolute 0.0 0.0 - 0.1 K/uL   Immature Granulocytes 1 %   Abs Immature Granulocytes 0.13 (H) 0.00 - 0.07 K/uL    Comment: Performed at [facility] [address] [place], [place] [address]  Protime-INR     Status: None   Collection Time: 08/04/19  6:53 PM  Result Value Ref Range   Prothrombin Time 12.5 11.4 - 15.2 seconds   INR 0.9 0.8 - 1.2    Comment: (NOTE) INR goal varies based on device and disease states. Performed at [facility], [place] [address], [place], [place] [address]     Chemistries  Recent Labs  Lab 07/29/19 2119 08/04/19 1853  NA 136 136  K 3.4* 3.4*  CL 104 102  CO2 21* 24  GLUCOSE 76 132*  BUN 18 5*  CREATININE 1.55* 0.92  CALCIUM 10.0 9.5  AST 29 14*  ALT 24  16  ALKPHOS 97 98  BILITOT 0.4 0.6   ------------------------------------------------------------------------------------------------------------------  ------------------------------------------------------------------------------------------------------------------ GFR: Estimated Creatinine Clearance: 73.5 mL/min (by C-G formula based on SCr of 0.92 mg/dL). Liver Function Tests: Recent Labs  Lab 07/29/19 2119 08/04/19 1853  AST 29 14*  ALT 24 16  ALKPHOS 97 98  BILITOT 0.4 0.6  PROT 7.3 7.3  ALBUMIN 3.2* 3.1*   No results for input(s): LIPASE, AMYLASE in the last 168 hours. No results for input(s): AMMONIA in the last 168 hours. Coagulation Profile: Recent Labs  Lab 08/04/19 1853  INR 0.9   Cardiac Enzymes: No results for input(s): CKTOTAL, CKMB, CKMBINDEX, TROPONINI in the last 168 hours. BNP (last 3 results) No results for input(s): PROBNP in the last 8760 hours. HbA1C: No results for input(s): HGBA1C in the last 72 hours. CBG: No results for input(s): GLUCAP in the last 168 hours. Lipid Profile: No results for input(s): CHOL, HDL, LDLCALC, TRIG, CHOLHDL, LDLDIRECT in the last 72 hours. Thyroid Function Tests: No results for input(s): TSH, T4TOTAL, FREET4, T3FREE, THYROIDAB in the last 72 hours. Anemia Panel: No results for input(s): VITAMINB12, FOLATE, FERRITIN, TIBC, IRON, RETICCTPCT in the last 72 hours.  --------------------------------------------------------------------------------------------------------------- Urine analysis: No results found for: COLORURINE, APPEARANCEUR, LABSPEC, PHURINE, GLUCOSEU, HGBUR, BILIRUBINUR, KETONESUR, PROTEINUR, UROBILINOGEN, NITRITE, LEUKOCYTESUR    Imaging Results:    No results found.     Assessment & Plan:    Principal Problem:   Cellulitis Active Problems:   Lymphedema   Cellulitis, leg  Cellulitis, refractory to oral abx Blood culture x2 Check lower extremity ultrasound, please follow up vanco iv,  rocephin iv Check cbc in am  Lymphedema Start lasix '20mg'$  iv qday  Hypokalemia Replete Check cmp in am  Dm2 Cont Glucotrol fsbs ac and qhs, ISS  Moderate protein calorie malnutrition prostat 30 ml po bid  Gerd Cont PPI Cont Pepcid  Anemia Cont ferrous sulfate  DVT Prophylaxis-   Lovenox - SCDs   AM Labs Ordered, also please review Full Orders  Family Communication: Admission, patients condition and  plan of care including tests being ordered have been discussed with the patient  who indicate understanding and agree with the plan and Code Status.  Code Status:  FULL CODE per patient  Admission status: Observation: Based on patients clinical presentation and evaluation of above clinical data, I have made determination that patient meets observation criteria at this time.     Time spent in minutes : 55  minutes   Jani Gravel M.D on 08/05/2019 at 5:56 AM

## 2019-08-05 NOTE — ED Notes (Signed)
Pt being transported to 5N22 via stretcher.

## 2019-08-05 NOTE — Progress Notes (Signed)
Pharmacy Antibiotic Note  ANALYS KNUTESON is a 68 y.o. female admitted on 08/04/2019 with LLE cellulitis.  Pharmacy has been consulted for Vancomycin dosing.  Plan: Vancomycin 2 g IV now, then 1 g IV q12h  Height: 5\' 9"  (175.3 cm) Weight: 220 lb (99.8 kg) IBW/kg (Calculated) : 66.2  Temp (24hrs), Avg:98.5 F (36.9 C), Min:98 F (36.7 C), Max:98.8 F (37.1 C)  Recent Labs  Lab 07/29/19 2119 08/04/19 1853  WBC 15.6* 9.4  CREATININE 1.55* 0.92  LATICACIDVEN 2.0* 1.7    Estimated Creatinine Clearance: 73.5 mL/min (by C-G formula based on SCr of 0.92 mg/dL).    Allergies  Allergen Reactions  . Other Other (See Comments)    Diet is low in sodium and low in sugar   Caryl Pina 08/05/2019 6:20 AM

## 2019-08-05 NOTE — ED Provider Notes (Signed)
[facility] EMERGENCY DEPARTMENT Provider Note   CSN: [id number] Arrival date & time: 08/04/19  1828     History   Chief Complaint Chief Complaint  Patient presents with  . Leg Swelling    HPI Jessica Yates is a 68 y.o. female.     Patient with PMH of lymphedema, DVT, cellulitis, presents to the ED with a chief complaint of left leg pain and swelling.  Was seen at [facility] today and sent to the ED for IV abx and admission.  Patient denies any fever.  Has had worsening swelling and redness despite being on oral Keflex for the past week.  Denies fevers or chills.  Denies any other associated symptoms.  The history is provided by the patient. No language interpreter was used.    Past Medical History:  Diagnosis Date  . Anemia 2002  . Anxiety   . Arthritis   . Asthma   . Blood clot in vein   . Cellulitis   . Cough   . Depression 02/06/2008  . DVT (deep venous thrombosis) ([place]) 2002   left leg  . Glaucoma   . Lymphedema   . Palpitations   . Peripheral venous insufficiency   . Seasonal allergies   . Sickle cell trait ([place])   . Vitamin D deficiency     Patient Active Problem List   Diagnosis Date Noted  . Cellulitis 05/13/2019  . Cellulitis of right lower extremity 05/12/2019  . CKD (chronic kidney disease) stage 3, GFR 30-59 ml/min 05/12/2019  . Normochromic normocytic anemia 05/12/2019  . Cellulitis, leg 05/12/2019  . Cellulitis and abscess of left leg 07/22/2018  . Cough 05/07/2016  . Upper airway cough syndrome 05/07/2016  . Sebaceous cyst of labia 04/19/2016  . OAB (overactive bladder) 04/19/2016  . 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 Lymphedema 05/03/2012  . CHF (congestive heart failure) ([place]) 04/16/2011  . SICKLE-CELL TRAIT 08/01/2007  . UNSPECIFIED GLAUCOMA 08/01/2007  . DVT 08/01/2007  . VENOUS INSUFFICIENCY 08/01/2007  . ALLERGIC RHINITIS 08/01/2007  . Extrinsic asthma 08/01/2007    Past Surgical  History:  Procedure Laterality Date  . COSMETIC SURGERY  1975   nasal reconstruction  . TOTAL ABDOMINAL HYSTERECTOMY  07/2001     OB History    Gravida  0   Para  0   Term  0   Preterm  0   AB  0   Living  0     SAB  0   TAB  0   Ectopic  0   Multiple  0   Live Births  0            Home Medications    Prior to Admission medications   Medication Sig Start Date End Date Taking? Authorizing Provider  acetaminophen (TYLENOL) 500 MG tablet Take 1,000 mg by mouth every 6 (six) hours as needed for moderate pain.    [provider]  aspirin 81 MG tablet Take 81 mg by mouth daily.      [provider]  blood glucose meter kit and supplies KIT Dispense based on patient and insurance preference. Use up to four times daily as directed. (FOR ICD-9 250.00, 250.01). 05/20/19   Kayleen Memos, DO  cephALEXin (KEFLEX) 500 MG capsule Take 1 capsule (500 mg total) by mouth 4 (four) times daily. 07/30/19   Varney Biles, MD  Cholecalciferol (VITAMIN D3) 25 MCG (1000 UT) CAPS Take 1 capsule (1,000 Units total)  by mouth daily. 05/20/19   Kayleen Memos, DO  ferrous sulfate 325 (65 FE) MG tablet Take 325 mg by mouth daily with breakfast.    [provider]  furosemide (LASIX) 40 MG tablet 61m twice daily for 5 days, then 43monce daily. Patient taking differently: Take 40 mg by mouth daily.  07/28/18   KrBonnielee HaffMD  gabapentin (NEURONTIN) 100 MG capsule Take 1 capsule (100 mg total) by mouth 2 (two) times daily. 05/20/19   HaKayleen MemosDO  glipiZIDE (GLUCOTROL) 10 MG tablet TAKE ONE TABLET BY MOUTH ONCE DAILY BEFORE A MEAL 05/25/19   [provider]  ipratropium (ATROVENT) 0.03 % nasal spray Place 2 sprays into the nose daily as needed for rhinitis.  04/18/18   [provider]  omeprazole (PRILOSEC) 40 MG capsule Take 40 mg by mouth daily. 05/01/19   [provider]  potassium chloride SA (K-DUR,KLOR-CON) 20 MEQ tablet Take 20  mEq by mouth daily.  04/27/16   [provider]  TRAVATAN Z 0.004 % SOLN ophthalmic solution Place 1 drop into both eyes At bedtime. 03/14/11   [provider]  triamcinolone cream (KENALOG) 0.1 % Apply 1 application topically daily as needed (for irritation).     [provider]  metFORMIN (GLUCOPHAGE) 500 MG tablet Take 1 tablet (500 mg total) by mouth 2 (two) times daily with a meal. Patient not taking: Reported on 06/14/2019 05/20/19 08/02/19  HaKayleen MemosDO  mirabegron ER (MYRBETRIQ) 25 MG TB24 tablet Take 1 tablet (25 mg total) by mouth daily. Patient taking differently: Take 25 mg by mouth daily as needed (overactive bladder).  04/19/16 08/02/19  FeTerrance MassMD    Family History Family History  Problem Relation Age of Onset  . Allergies Mother   . Asthma Mother   . Other Mother        enlarged heart  . Arthritis Mother   . Breast cancer Mother   . Diabetes Mother   . Hypertension Mother   . Cancer Mother        breast  . Hyperlipidemia Mother   . Heart disease Maternal Grandmother   . Breast cancer Maternal Grandmother   . Birth defects Maternal Grandmother        breast  . Cancer Father [id number]     leukemia  . Allergies Sister   . Arthritis Sister     Social History Social History   Tobacco Use  . Smoking status: Never Smoker  . Smokeless tobacco: Never Used  Substance Use Topics  . Alcohol use: No  . Drug use: No     Allergies   Patient has no known allergies.   Review of Systems Review of Systems  All other systems reviewed and are negative.    Physical Exam Updated Vital Signs BP 109/72 (BP Location: Right Arm)   Pulse 87   Temp 98 F (36.7 C) (Oral)   Resp 18   Ht _0  (1.753 m)   Wt 99.8 kg   SpO2 96%   BMI 32.49 kg/m   Physical Exam Vitals signs and nursing note reviewed.  Constitutional:      General: She is not in acute distress.    Appearance: She is well-developed.  HENT:     Head: Normocephalic  and atraumatic.  Eyes:     Conjunctiva/sclera: Conjunctivae normal.  Neck:     Musculoskeletal: Neck supple.  Cardiovascular:     Rate and  Rhythm: Normal rate and regular rhythm.     Heart sounds: No murmur.  Pulmonary:     Effort: Pulmonary effort is normal. No respiratory distress.     Breath sounds: Normal breath sounds.  Abdominal:     Palpations: Abdomen is soft.     Tenderness: There is no abdominal tenderness.  Musculoskeletal:     Right lower leg: Edema present.  Skin:    General: Skin is warm and dry.  Neurological:     Mental Status: She is alert.  Psychiatric:        Mood and Affect: Mood normal.        Behavior: Behavior normal.          ED Treatments / Results  Labs (all labs ordered are listed, but only abnormal results are displayed) Labs Reviewed  COMPREHENSIVE METABOLIC PANEL - Abnormal; Notable for the following components:      Result Value   Potassium 3.4 (*)    Glucose, Bld 132 (*)    BUN 5 (*)    Albumin 3.1 (*)    AST 14 (*)    All other components within normal limits  CBC WITH DIFFERENTIAL/PLATELET - Abnormal; Notable for the following components:   Hemoglobin 9.8 (*)    HCT 32.0 (*)    MCH 24.9 (*)    RDW 18.2 (*)    Abs Immature Granulocytes 0.13 (*)    All other components within normal limits  CULTURE, BLOOD (ROUTINE X 2)  CULTURE, BLOOD (ROUTINE X 2)  LACTIC ACID, PLASMA  PROTIME-INR  LACTIC ACID, PLASMA    EKG None  Radiology No results found.  Procedures Procedures (including critical care time)  Medications Ordered in ED Medications - No data to display   Initial Impression / Assessment and Plan / ED Course  I have reviewed the triage vital signs and the nursing notes.  Pertinent labs & imaging results that were available during my care of the patient were reviewed by me and considered in my medical decision making (see chart for details).        Patient with severe chronic lymphedema in bilateral lower  extremities, now presenting with superimposed cellulitis of the left lower extremity.  She had been on Keflex for the past week, but has not had significant improvement.  She is afebrile and does not have a marked leukocytosis, however given that she is not improving with outpatient antibiotics and has a fairly impressive exam, will admit for IV antibiotics.  Appreciate Dr. Maudie Mercury for admitting the patient.  Final Clinical Impressions(s) / ED Diagnoses   Final diagnoses:  Cellulitis of left lower extremity    ED Discharge Orders    None       Montine Circle, PA-C 08/05/19 4270    Ward, Delice Bison, DO 08/05/19 [phone]

## 2019-08-06 ENCOUNTER — Encounter (HOSPITAL_COMMUNITY): Payer: Self-pay | Admitting: *Deleted

## 2019-08-06 ENCOUNTER — Ambulatory Visit (HOSPITAL_BASED_OUTPATIENT_CLINIC_OR_DEPARTMENT_OTHER): Payer: Medicare HMO

## 2019-08-06 DIAGNOSIS — R609 Edema, unspecified: Secondary | ICD-10-CM | POA: Diagnosis not present

## 2019-08-06 DIAGNOSIS — L03116 Cellulitis of left lower limb: Secondary | ICD-10-CM | POA: Diagnosis not present

## 2019-08-06 LAB — COMPREHENSIVE METABOLIC PANEL
ALT: 12 U/L (ref 0–44)
AST: 11 U/L — ABNORMAL LOW (ref 15–41)
Albumin: 2.5 g/dL — ABNORMAL LOW (ref 3.5–5.0)
Alkaline Phosphatase: 73 U/L (ref 38–126)
Anion gap: 7 (ref 5–15)
BUN: 10 mg/dL (ref 8–23)
CO2: 28 mmol/L (ref 22–32)
Calcium: 9.4 mg/dL (ref 8.9–10.3)
Chloride: 105 mmol/L (ref 98–111)
Creatinine, Ser: 1.07 mg/dL — ABNORMAL HIGH (ref 0.44–1.00)
GFR calc Af Amer: >60 mL/min (ref >60)
GFR calc non Af Amer: 53 mL/min — ABNORMAL LOW (ref >60)
Glucose, Bld: 99 mg/dL (ref 70–99)
Potassium: 4.2 mmol/L (ref 3.5–5.1)
Sodium: 140 mmol/L (ref 135–145)
Total Bilirubin: 0.6 mg/dL (ref 0.3–1.2)
Total Protein: 6.3 g/dL — ABNORMAL LOW (ref 6.5–8.1)

## 2019-08-06 LAB — HIV ANTIBODY (ROUTINE TESTING W REFLEX): HIV Screen 4th Generation wRfx: NONREACTIVE

## 2019-08-06 LAB — GLUCOSE, CAPILLARY
Glucose-Capillary: 96 mg/dL (ref 70–99)
Glucose-Capillary: 81 mg/dL (ref 70–99)
Glucose-Capillary: 27 mg/dL — CL (ref 70–99)
Glucose-Capillary: 38 mg/dL — CL (ref 70–99)
Glucose-Capillary: 74 mg/dL (ref 70–99)
Glucose-Capillary: 94 mg/dL (ref 70–99)

## 2019-08-06 LAB — CBC
HCT: 27 % — ABNORMAL LOW (ref 36.0–46.0)
Hemoglobin: 8.7 g/dL — ABNORMAL LOW (ref 12.0–15.0)
MCH: 25.3 pg — ABNORMAL LOW (ref 26.0–34.0)
MCHC: 32.2 g/dL (ref 30.0–36.0)
MCV: 78.5 fL — ABNORMAL LOW (ref 80.0–100.0)
Platelets: 339 10*3/uL (ref 150–400)
RBC: 3.44 MIL/uL — ABNORMAL LOW (ref 3.87–5.11)
RDW: 17.9 % — ABNORMAL HIGH (ref 11.5–15.5)
WBC: 8.4 10*3/uL (ref 4.0–10.5)
nRBC: 0 % (ref 0.0–0.2)

## 2019-08-06 MED ORDER — LORATADINE 10 MG PO TABS
10.0000 mg | ORAL_TABLET | Freq: Every day | ORAL | Status: DC
  Administered 2019-08-06 – 2019-08-12 (×7): 10 mg via ORAL
  Filled 2019-08-06 (×7): qty 1

## 2019-08-06 MED ORDER — SODIUM CHLORIDE 0.9 % IV SOLN
INTRAVENOUS | Status: DC | PRN
  Administered 2019-08-06: 250 mL via INTRAVENOUS

## 2019-08-06 NOTE — Progress Notes (Signed)
Lower extremity venous has been completed.   Preliminary results in CV Proc.   Abram Sander 08/06/2019 9:30 AM

## 2019-08-06 NOTE — Care Management Obs Status (Signed)
[facility] NOTIFICATION   Patient Details  Name: Jessica Yates MRN: [id number] Date of Birth: [birth year]   Medicare Observation Status Notification Given:  Yes    Atilano Median, [place] 08/06/2019, 3:03 PM

## 2019-08-06 NOTE — Progress Notes (Signed)
Orthopedic Tech Progress Note Patient Details:  Jessica Yates 27-Jul-1951 [id number]  Patient ID: [name], female   DOB: 09-08-1951, 68 y.o.   MRN: [id number]   Jessica Yates 08/06/2019, 3:00 PM[place] and I Made contact on three Occasions, Jessica Yates refused unna boots

## 2019-08-06 NOTE — Progress Notes (Signed)
Orthopedic Tech Progress Note Patient Details:  Jessica Yates 1951-08-26 [id number]  Ortho Devices Type of Ortho Device: Louretta Parma boot Ortho Device/Splint Location: (B) LE Ortho Device/Splint Interventions: Ordered, Application   Post Interventions Patient Tolerated: Well Instructions Provided: Care of device   Braulio Bosch 08/06/2019, 5:06 PM

## 2019-08-06 NOTE — Progress Notes (Signed)
Hospitalist progress note   Jessica Yates [id number] DOB: [birth year] DOA: 08/04/2019  PCP: Sonia Side., FNP   Narrative:  68 year old female, asthma, depression prior DVT 2002 completing anticoagulation, chronic diastolic heart failure last EF 0000000 grade 1 diastolic dysfunction Recently seen in the ED 07/1610/19 redness distal lower extremity.  Started on Keflex-came back to emergency room 11/22 found to have refractory cellulitis and admitted for the same Assessment & Plan: Cellulitis failing outpatient therapy-continue vancomycin/ceftriaxone and narrow to probably Keflex or doxycycline next 24 hours-because of swelling we will place [name] and see if this helps with the blebs on her feet repeat CBC a.m. Prior DVT 2002-ultrasound Doppler done 11/23 is negative Microcytic anemia-obtain iron studies might need IV iron Chronic diastolic heart failure EF 55-60%-continue Lasix as needed as outpatient not on any other meds-continue Lasix 20 daily DM TY 2 continue glipizide 10 mg outpatient gabapentin 100 twice daily-blood sugars are controlled 81-99 range with sliding scale and glipizide Hypokalemia replaced with potassium now normal-monitor trends Reflux continue Protonix 40 daily With sliding scale and glipizide overflow incontinence continue Myrbetriq 25 as needed  Subjective: Awake alert many questions no distress leg feels better not as swollen  Consultants:   None Procedures:   Doppler lower extremity 7/22 - for DVT Antimicrobials:   Vancomycin ceftriaxone Data Reviewed:  Labs White count 8.4 hemoglobin 8.7 MCV 78 platelets 339 Sodium 140 BUN/creatinine 10/1.0 Radiology Studies: Ultrasound pending  Objective: Vitals:   08/05/19 1541 08/05/19 1814 08/05/19 1926 08/06/19 0500  BP: 119/69 140/88 139/75 111/74  Pulse: 80 92 87 67  Resp: 18 17 17 18   Temp: 98.6 F (37 C) 98.4 F (36.9 C) 98.7 F (37.1 C) 98.5 F (36.9 C)  TempSrc: Oral Oral Oral Oral  SpO2:  100% 100% 100% 100%  Weight:      Height:        Intake/Output Summary (Last 24 hours) at 08/06/2019 0732 Last data filed at 08/06/2019 0500 Gross per 24 hour  Intake 600 ml  Output 3350 ml  Net -2750 ml   Filed Weights   08/04/19 1839 08/05/19 1024  Weight: 99.8 kg 99.8 kg    Examination: Awake alert coherent pleasant no distress thick neck Mallampati 2 S1-S2 no murmur rub or gallop Chest clear no added sound Abdomen obese nontender nondistended no rebound no guarding Lower extremity wound arthritis enlarged with blebs in the posterior lateral areas some bruising Neurologically intact moving all 4 limbs equally  Scheduled Meds: . aspirin EC  81 mg Oral Daily  . enoxaparin (LOVENOX) injection  40 mg Subcutaneous Daily  . famotidine  20 mg Oral BID  . feeding supplement (PRO-STAT SUGAR FREE 64)  30 mL Oral BID  . ferrous sulfate  325 mg Oral 3 times weekly  . furosemide  20 mg Intravenous Daily  . glipiZIDE  10 mg Oral QAC breakfast  . insulin aspart  0-5 Units Subcutaneous QHS  . insulin aspart  0-6 Units Subcutaneous TID WC  . latanoprost  1 drop Both Eyes QHS  . pantoprazole  40 mg Oral Daily  . potassium chloride SA  20 mEq Oral Daily   Continuous Infusions: . cefTRIAXone (ROCEPHIN)  IV Stopped (08/05/19 1025)  . vancomycin       LOS: 0 days   Time spent: [place], MD [facility]  08/06/2019, 7:32 AM

## 2019-08-06 NOTE — Plan of Care (Signed)
  Problem: Education: Goal: Knowledge of General Education information will improve Description: Including pain rating scale, medication(s)/side effects and non-pharmacologic comfort measures Outcome: Progressing   Problem: Clinical Measurements: Goal: Respiratory complications will improve Outcome: Progressing Note: On room air   Problem: Nutrition: Goal: Adequate nutrition will be maintained Outcome: Progressing   Problem: Coping: Goal: Level of anxiety will decrease Outcome: Progressing   Problem: Elimination: Goal: Will not experience complications related to urinary retention Outcome: Progressing   Problem: Pain Managment: Goal: General experience of comfort will improve Outcome: Progressing   Problem: Safety: Goal: Ability to remain free from injury will improve Outcome: Progressing   

## 2019-08-07 DIAGNOSIS — S7012XA Contusion of left thigh, initial encounter: Secondary | ICD-10-CM | POA: Diagnosis not present

## 2019-08-07 DIAGNOSIS — Z86718 Personal history of other venous thrombosis and embolism: Secondary | ICD-10-CM | POA: Diagnosis not present

## 2019-08-07 DIAGNOSIS — H409 Unspecified glaucoma: Secondary | ICD-10-CM | POA: Diagnosis present

## 2019-08-07 DIAGNOSIS — M79605 Pain in left leg: Secondary | ICD-10-CM | POA: Diagnosis present

## 2019-08-07 DIAGNOSIS — J45909 Unspecified asthma, uncomplicated: Secondary | ICD-10-CM | POA: Diagnosis present

## 2019-08-07 DIAGNOSIS — Z90710 Acquired absence of both cervix and uterus: Secondary | ICD-10-CM | POA: Diagnosis not present

## 2019-08-07 DIAGNOSIS — Z8349 Family history of other endocrine, nutritional and metabolic diseases: Secondary | ICD-10-CM | POA: Diagnosis not present

## 2019-08-07 DIAGNOSIS — X58XXXA Exposure to other specified factors, initial encounter: Secondary | ICD-10-CM | POA: Diagnosis not present

## 2019-08-07 DIAGNOSIS — Z6832 Body mass index (BMI) 32.0-32.9, adult: Secondary | ICD-10-CM | POA: Diagnosis not present

## 2019-08-07 DIAGNOSIS — N39490 Overflow incontinence: Secondary | ICD-10-CM | POA: Diagnosis present

## 2019-08-07 DIAGNOSIS — N183 Chronic kidney disease, stage 3 unspecified: Secondary | ICD-10-CM | POA: Diagnosis present

## 2019-08-07 DIAGNOSIS — Z833 Family history of diabetes mellitus: Secondary | ICD-10-CM | POA: Diagnosis not present

## 2019-08-07 DIAGNOSIS — L03116 Cellulitis of left lower limb: Secondary | ICD-10-CM | POA: Diagnosis present

## 2019-08-07 DIAGNOSIS — I5032 Chronic diastolic (congestive) heart failure: Secondary | ICD-10-CM | POA: Diagnosis present

## 2019-08-07 DIAGNOSIS — Z8249 Family history of ischemic heart disease and other diseases of the circulatory system: Secondary | ICD-10-CM | POA: Diagnosis not present

## 2019-08-07 DIAGNOSIS — E440 Moderate protein-calorie malnutrition: Secondary | ICD-10-CM | POA: Diagnosis present

## 2019-08-07 DIAGNOSIS — K219 Gastro-esophageal reflux disease without esophagitis: Secondary | ICD-10-CM | POA: Diagnosis present

## 2019-08-07 DIAGNOSIS — E876 Hypokalemia: Secondary | ICD-10-CM | POA: Diagnosis present

## 2019-08-07 DIAGNOSIS — E11649 Type 2 diabetes mellitus with hypoglycemia without coma: Secondary | ICD-10-CM | POA: Diagnosis not present

## 2019-08-07 DIAGNOSIS — D573 Sickle-cell trait: Secondary | ICD-10-CM | POA: Diagnosis present

## 2019-08-07 DIAGNOSIS — I872 Venous insufficiency (chronic) (peripheral): Secondary | ICD-10-CM | POA: Diagnosis present

## 2019-08-07 DIAGNOSIS — I890 Lymphedema, not elsewhere classified: Secondary | ICD-10-CM | POA: Diagnosis present

## 2019-08-07 DIAGNOSIS — E1122 Type 2 diabetes mellitus with diabetic chronic kidney disease: Secondary | ICD-10-CM | POA: Diagnosis present

## 2019-08-07 DIAGNOSIS — Z20828 Contact with and (suspected) exposure to other viral communicable diseases: Secondary | ICD-10-CM | POA: Diagnosis present

## 2019-08-07 DIAGNOSIS — D509 Iron deficiency anemia, unspecified: Secondary | ICD-10-CM | POA: Diagnosis present

## 2019-08-07 DIAGNOSIS — Z825 Family history of asthma and other chronic lower respiratory diseases: Secondary | ICD-10-CM | POA: Diagnosis not present

## 2019-08-07 LAB — GLUCOSE, CAPILLARY
Glucose-Capillary: 116 mg/dL — ABNORMAL HIGH (ref 70–99)
Glucose-Capillary: 121 mg/dL — ABNORMAL HIGH (ref 70–99)
Glucose-Capillary: 105 mg/dL — ABNORMAL HIGH (ref 70–99)
Glucose-Capillary: 83 mg/dL (ref 70–99)

## 2019-08-07 LAB — CBC WITH DIFFERENTIAL/PLATELET
Abs Immature Granulocytes: 0.15 10*3/uL — ABNORMAL HIGH (ref 0.00–0.07)
Basophils Absolute: 0 10*3/uL (ref 0.0–0.1)
Basophils Relative: 0 %
Eosinophils Absolute: 0.2 10*3/uL (ref 0.0–0.5)
Eosinophils Relative: 3 %
HCT: 27.4 % — ABNORMAL LOW (ref 36.0–46.0)
Hemoglobin: 8.7 g/dL — ABNORMAL LOW (ref 12.0–15.0)
Immature Granulocytes: 2 %
Lymphocytes Relative: 24 %
Lymphs Abs: 2.1 10*3/uL (ref 0.7–4.0)
MCH: 25 pg — ABNORMAL LOW (ref 26.0–34.0)
MCHC: 31.8 g/dL (ref 30.0–36.0)
MCV: 78.7 fL — ABNORMAL LOW (ref 80.0–100.0)
Monocytes Absolute: 0.5 10*3/uL (ref 0.1–1.0)
Monocytes Relative: 6 %
Neutro Abs: 5.7 10*3/uL (ref 1.7–7.7)
Neutrophils Relative %: 65 %
Platelets: 358 10*3/uL (ref 150–400)
RBC: 3.48 MIL/uL — ABNORMAL LOW (ref 3.87–5.11)
RDW: 17.8 % — ABNORMAL HIGH (ref 11.5–15.5)
WBC: 8.7 10*3/uL (ref 4.0–10.5)
nRBC: 0 % (ref 0.0–0.2)

## 2019-08-07 LAB — BASIC METABOLIC PANEL
Anion gap: 9 (ref 5–15)
BUN: 16 mg/dL (ref 8–23)
CO2: 26 mmol/L (ref 22–32)
Calcium: 9.6 mg/dL (ref 8.9–10.3)
Chloride: 106 mmol/L (ref 98–111)
Creatinine, Ser: 1.07 mg/dL — ABNORMAL HIGH (ref 0.44–1.00)
GFR calc Af Amer: >60 mL/min (ref >60)
GFR calc non Af Amer: 53 mL/min — ABNORMAL LOW (ref >60)
Glucose, Bld: 119 mg/dL — ABNORMAL HIGH (ref 70–99)
Potassium: 4.6 mmol/L (ref 3.5–5.1)
Sodium: 141 mmol/L (ref 135–145)

## 2019-08-07 LAB — RETICULOCYTES
Immature Retic Fract: 30.5 % — ABNORMAL HIGH (ref 2.3–15.9)
RBC.: 4.01 MIL/uL (ref 3.87–5.11)
Retic Count, Absolute: 61 10*3/uL (ref 19.0–186.0)
Retic Ct Pct: 1.5 % (ref 0.4–3.1)

## 2019-08-07 LAB — IRON AND TIBC
Iron: 38 ug/dL (ref 28–170)
Saturation Ratios: 14 % (ref 10.4–31.8)
TIBC: 273 ug/dL (ref 250–450)
UIBC: 235 ug/dL

## 2019-08-07 LAB — FERRITIN: Ferritin: 197 ng/mL (ref 11–307)

## 2019-08-07 LAB — VITAMIN B12: Vitamin B-12: 359 pg/mL (ref 180–914)

## 2019-08-07 LAB — FOLATE: Folate: 4.8 ng/mL — ABNORMAL LOW (ref >5.9)

## 2019-08-07 MED ORDER — GLIPIZIDE 5 MG PO TABS
2.5000 mg | ORAL_TABLET | Freq: Every day | ORAL | Status: DC
  Administered 2019-08-09 – 2019-08-12 (×3): 2.5 mg via ORAL
  Filled 2019-08-07 (×5): qty 1

## 2019-08-07 MED ORDER — DOXYCYCLINE HYCLATE 100 MG PO TABS
100.0000 mg | ORAL_TABLET | Freq: Two times a day (BID) | ORAL | Status: DC
  Administered 2019-08-07 – 2019-08-08 (×4): 100 mg via ORAL
  Filled 2019-08-07 (×4): qty 1

## 2019-08-07 MED ORDER — CEPHALEXIN 500 MG PO CAPS
500.0000 mg | ORAL_CAPSULE | Freq: Two times a day (BID) | ORAL | Status: DC
  Administered 2019-08-07: 500 mg via ORAL
  Filled 2019-08-07: qty 1

## 2019-08-07 NOTE — Plan of Care (Signed)

## 2019-08-07 NOTE — Progress Notes (Signed)
Inpatient Diabetes Program Recommendations  AACE/ADA: New Consensus Statement on Inpatient Glycemic Control (2015)  Target Ranges:  Prepandial:   less than 140 mg/dL      Peak postprandial:   less than 180 mg/dL (1-2 hours)      Critically ill patients:  140 - 180 mg/dL   Results for DERYL, UMPHLETT (MRN [id number]) as of 08/07/2019 11:49  Ref. Range 08/06/2019 06:43 08/06/2019 11:34 08/06/2019 16:42 08/06/2019 17:06 08/06/2019 18:27 08/06/2019 21:27 08/07/2019 06:19  Glucose-Capillary Latest Ref Range: 70 - 99 mg/dL 96 81 27 (LL) 38 (LL) 74 94 116 (H)   Review of Glycemic Control  Diabetes history: DM2 Outpatient Diabetes medications: Glipizide 10 mg QAM Current orders for Inpatient glycemic control: Glipizide 10 mg QAM, Novolog 0-6 units TID with meals, Novolog 0-5 units QHS  Inpatient Diabetes Program Recommendations:   Oral DM medication: Please discontinued Glipizide while inpatient.  Thanks, Barnie Alderman, RN, MSN, CDE Diabetes Coordinator Inpatient Diabetes Program [phone] (Team Pager from 8am to 5pm)

## 2019-08-07 NOTE — Progress Notes (Signed)
Hospitalist progress note   ENVI BIBEY [id number] DOB: [birth year] DOA: 08/04/2019  PCP: Sonia Side., FNP   Narrative:  68 year old female, asthma, depression prior DVT 2002 completing anticoagulation, chronic diastolic heart failure last EF 0000000 grade 1 diastolic dysfunction Recently seen in the ED 07/1610/19 redness distal lower extremity.  Started on Keflex-came back to emergency room 11/22 found to have refractory cellulitis and admitted for the same Assessment & Plan: Cellulitis failing outpatient therapy-continue vancomycin/ceftriaxone on admission transition back to Keflex [name] placed will need outpatient RN to help with dressings etc. in the outpatient setting Prior DVT 2002-ultrasound Doppler done 11/23 is negative Microcytic anemia-await iron studies might need IV iron Chronic diastolic heart failure EF 55-60%-continue Lasix as needed as outpatient not on any other meds-continue Lasix 20 daily DM TY 2 continue glipizide 10 mg outpatient gabapentin 100 twice daily-CBG ranges 90s to 100s Hypokalemia replaced with potassium now normal-monitor trends-potassium 4.6 Reflux continue Protonix 40 daily  overflow incontinence continue Myrbetriq 25 as needed  Subjective: Fair coherent no distress no pain no fever no chills  Consultants:   None Procedures:   Doppler lower extremity 7/22 - for DVT Antimicrobials:   Vancomycin ceftriaxone Data Reviewed:  Labs White count 8.4-->8.7 Hemoglobin 8.7->8.7 MCV 78 platelets 339 Sodium 140 BUN/creatinine 10/1.0-->16/1.07 Radiology Studies: Ultrasound did not show any clot  Objective: Vitals:   08/06/19 0500 08/06/19 0841 08/06/19 2031 08/07/19 0236  BP: 111/74 134/79 117/69 105/71  Pulse: 67 79 88 77  Resp: 18 18    Temp: 98.5 F (36.9 C) 98.1 F (36.7 C) 98.4 F (36.9 C) 98.6 F (37 C)  TempSrc: Oral Oral Oral Oral  SpO2: 100% 100% 100% 100%  Weight:      Height:        Intake/Output Summary (Last 24 hours)  at 08/07/2019 0821 Last data filed at 08/07/2019 0300 Gross per 24 hour  Intake 1144.29 ml  Output 2550 ml  Net -1405.71 ml   Filed Weights   08/04/19 1839 08/05/19 1024  Weight: 99.8 kg 99.8 kg    Examination: Very pleasant no distress EOMI NCAT no focal deficit S1-S2 no murmur rub or gallop Chest clinically clear Abdomen soft nontender no rebound or guarding ROM intact Both legs are wrapped in [name]  Scheduled Meds: . aspirin EC  81 mg Oral Daily  . enoxaparin (LOVENOX) injection  40 mg Subcutaneous Daily  . famotidine  20 mg Oral BID  . feeding supplement (PRO-STAT SUGAR FREE 64)  30 mL Oral BID  . ferrous sulfate  325 mg Oral 3 times weekly  . furosemide  20 mg Intravenous Daily  . glipiZIDE  10 mg Oral QAC breakfast  . insulin aspart  0-5 Units Subcutaneous QHS  . insulin aspart  0-6 Units Subcutaneous TID WC  . latanoprost  1 drop Both Eyes QHS  . loratadine  10 mg Oral Daily  . pantoprazole  40 mg Oral Daily  . potassium chloride SA  20 mEq Oral Daily   Continuous Infusions: . sodium chloride Stopped (08/06/19 1124)  . cefTRIAXone (ROCEPHIN)  IV Stopped (08/06/19 1052)  . vancomycin 166.7 mL/hr at 08/06/19 1603     LOS: 0 days   Time spent: [place], MD [facility]  08/07/2019, 8:21 AM

## 2019-08-08 LAB — CBC WITH DIFFERENTIAL/PLATELET
Abs Immature Granulocytes: 0.13 10*3/uL — ABNORMAL HIGH (ref 0.00–0.07)
Basophils Absolute: 0 10*3/uL (ref 0.0–0.1)
Basophils Relative: 0 %
Eosinophils Absolute: 0.2 10*3/uL (ref 0.0–0.5)
Eosinophils Relative: 2 %
HCT: 30.7 % — ABNORMAL LOW (ref 36.0–46.0)
Hemoglobin: 9.7 g/dL — ABNORMAL LOW (ref 12.0–15.0)
Immature Granulocytes: 1 %
Lymphocytes Relative: 19 %
Lymphs Abs: 2.2 10*3/uL (ref 0.7–4.0)
MCH: 25.2 pg — ABNORMAL LOW (ref 26.0–34.0)
MCHC: 31.6 g/dL (ref 30.0–36.0)
MCV: 79.7 fL — ABNORMAL LOW (ref 80.0–100.0)
Monocytes Absolute: 0.7 10*3/uL (ref 0.1–1.0)
Monocytes Relative: 6 %
Neutro Abs: 8.5 10*3/uL — ABNORMAL HIGH (ref 1.7–7.7)
Neutrophils Relative %: 72 %
Platelets: 397 10*3/uL (ref 150–400)
RBC: 3.85 MIL/uL — ABNORMAL LOW (ref 3.87–5.11)
RDW: 17.8 % — ABNORMAL HIGH (ref 11.5–15.5)
WBC: 11.8 10*3/uL — ABNORMAL HIGH (ref 4.0–10.5)
nRBC: 0 % (ref 0.0–0.2)

## 2019-08-08 LAB — GLUCOSE, CAPILLARY
Glucose-Capillary: 89 mg/dL (ref 70–99)
Glucose-Capillary: 128 mg/dL — ABNORMAL HIGH (ref 70–99)
Glucose-Capillary: 124 mg/dL — ABNORMAL HIGH (ref 70–99)
Glucose-Capillary: 136 mg/dL — ABNORMAL HIGH (ref 70–99)

## 2019-08-08 NOTE — Plan of Care (Signed)
  Problem: Education: Goal: Knowledge of General Education information will improve Description Including pain rating scale, medication(s)/side effects and non-pharmacologic comfort measures Outcome: Progressing   Problem: Health Behavior/Discharge Planning: Goal: Ability to manage health-related needs will improve Outcome: Progressing   

## 2019-08-08 NOTE — Progress Notes (Signed)
Hospitalist progress note   Jessica Yates [id number] DOB: August 27, 1951 DOA: 08/04/2019  PCP: Sonia Side., FNP   Narrative:  68 year old female, asthma, depression prior DVT 2002 completing anticoagulation, chronic diastolic heart failure last EF 0000000 grade 1 diastolic dysfunction Recently seen in the ED 07/1610/19 redness distal lower extremity.  Started on Keflex-came back to emergency room 11/22 found to have refractory cellulitis and admitted for the same Assessment & Plan: Cellulitis failing outpatient therapy- vancomycin/ceftriaxone -->doxycycline--WBC up and low grade temps-monitor overnight--if further fever, check wounds and switch back to IV abx Prior DVT 2002-ultrasound Doppler done 11/23 is negative Microcytic anemia-iron studies normal 11/24-continue PO replaceemnt Chronic diastolic heart failure EF 55-60%-continue Lasix as needed as outpatient not on any other meds-continue Lasix 20 daily DM TY 2 continue glipizide 10 mg-->2.5 as was hypoglycemic 11/24 outpatient gabapentin 100 twice daily--sugars are now 80-120 Hypokalemia replaced with potassium now normal-monitor trends-potassium 4.6 Reflux continue Protonix 40 daily  overflow incontinence continue Myrbetriq 25 as needed  Subjective: Fair no issues had some low grade tamps last pm No cp  some belching and cough many quesitons about meds  Consultants:   None Procedures:   Doppler lower extremity 7/22 - for DVT Antimicrobials:   Vancomycin ceftriaxone-->doxy 11/24 Data Reviewed:  Labs White count 8.4-->8.7-->11.7 Hemoglobin 8.7->8.7-->9.7  Radiology Studies: Ultrasound did not show any clot  Objective: Vitals:   08/07/19 0236 08/07/19 0914 08/07/19 1612 08/07/19 2254  BP: 105/71 127/83 117/60   Pulse: 77 75 93   Resp:   18   Temp: 98.6 F (37 C) 98.7 F (37.1 C) 98.5 F (36.9 C) 98.7 F (37.1 C)  TempSrc: Oral Oral Oral Oral  SpO2: 100% 100% 100%   Weight:      Height:        Intake/Output  Summary (Last 24 hours) at 08/08/2019 0845 Last data filed at 08/08/2019 0500 Gross per 24 hour  Intake 900 ml  Output 3650 ml  Net -2750 ml   Filed Weights   08/04/19 1839 08/05/19 1024  Weight: 99.8 kg 99.8 kg    Examination:  no distress EOMI NCAT no focal deficit neck soft supple S1-S2 no murmur rub or gallop Chest clinically clear Abdomen soft nontender no rebound or guarding ROM intact Both legs are wrapped in [name]--not examiend today  Scheduled Meds: . aspirin EC  81 mg Oral Daily  . doxycycline  100 mg Oral Q12H  . enoxaparin (LOVENOX) injection  40 mg Subcutaneous Daily  . famotidine  20 mg Oral BID  . feeding supplement (PRO-STAT SUGAR FREE 64)  30 mL Oral BID  . ferrous sulfate  325 mg Oral 3 times weekly  . furosemide  20 mg Intravenous Daily  . glipiZIDE  2.5 mg Oral QAC breakfast  . insulin aspart  0-5 Units Subcutaneous QHS  . insulin aspart  0-6 Units Subcutaneous TID WC  . latanoprost  1 drop Both Eyes QHS  . loratadine  10 mg Oral Daily  . pantoprazole  40 mg Oral Daily  . potassium chloride SA  20 mEq Oral Daily   Continuous Infusions: . sodium chloride Stopped (08/06/19 1124)     LOS: 1 day   Time spent: [place], MD [facility]  08/08/2019, 8:45 AM

## 2019-08-09 LAB — RENAL FUNCTION PANEL
Albumin: 2.9 g/dL — ABNORMAL LOW (ref 3.5–5.0)
Anion gap: 9 (ref 5–15)
BUN: 28 mg/dL — ABNORMAL HIGH (ref 8–23)
CO2: 27 mmol/L (ref 22–32)
Calcium: 10.1 mg/dL (ref 8.9–10.3)
Chloride: 101 mmol/L (ref 98–111)
Creatinine, Ser: 1.25 mg/dL — ABNORMAL HIGH (ref 0.44–1.00)
GFR calc Af Amer: 51 mL/min — ABNORMAL LOW (ref >60)
GFR calc non Af Amer: 44 mL/min — ABNORMAL LOW (ref >60)
Glucose, Bld: 157 mg/dL — ABNORMAL HIGH (ref 70–99)
Phosphorus: 3.4 mg/dL (ref 2.5–4.6)
Potassium: 4 mmol/L (ref 3.5–5.1)
Sodium: 137 mmol/L (ref 135–145)

## 2019-08-09 LAB — CULTURE, BLOOD (ROUTINE X 2): Culture: NO GROWTH

## 2019-08-09 LAB — CBC WITH DIFFERENTIAL/PLATELET
Abs Immature Granulocytes: 0.1 10*3/uL — ABNORMAL HIGH (ref 0.00–0.07)
Basophils Absolute: 0 10*3/uL (ref 0.0–0.1)
Basophils Relative: 0 %
Eosinophils Absolute: 0.2 10*3/uL (ref 0.0–0.5)
Eosinophils Relative: 2 %
HCT: 32.8 % — ABNORMAL LOW (ref 36.0–46.0)
Hemoglobin: 10.3 g/dL — ABNORMAL LOW (ref 12.0–15.0)
Immature Granulocytes: 1 %
Lymphocytes Relative: 16 %
Lymphs Abs: 2.1 10*3/uL (ref 0.7–4.0)
MCH: 24.9 pg — ABNORMAL LOW (ref 26.0–34.0)
MCHC: 31.4 g/dL (ref 30.0–36.0)
MCV: 79.2 fL — ABNORMAL LOW (ref 80.0–100.0)
Monocytes Absolute: 0.8 10*3/uL (ref 0.1–1.0)
Monocytes Relative: 6 %
Neutro Abs: 10.1 10*3/uL — ABNORMAL HIGH (ref 1.7–7.7)
Neutrophils Relative %: 75 %
Platelets: 428 10*3/uL — ABNORMAL HIGH (ref 150–400)
RBC: 4.14 MIL/uL (ref 3.87–5.11)
RDW: 17.8 % — ABNORMAL HIGH (ref 11.5–15.5)
WBC: 13.3 10*3/uL — ABNORMAL HIGH (ref 4.0–10.5)
nRBC: 0 % (ref 0.0–0.2)

## 2019-08-09 LAB — GLUCOSE, CAPILLARY
Glucose-Capillary: 130 mg/dL — ABNORMAL HIGH (ref 70–99)
Glucose-Capillary: 136 mg/dL — ABNORMAL HIGH (ref 70–99)
Glucose-Capillary: 112 mg/dL — ABNORMAL HIGH (ref 70–99)
Glucose-Capillary: 91 mg/dL (ref 70–99)

## 2019-08-09 MED ORDER — VANCOMYCIN HCL 10 G IV SOLR
1250.0000 mg | INTRAVENOUS | Status: DC
  Administered 2019-08-09: 1250 mg via INTRAVENOUS
  Filled 2019-08-09 (×3): qty 1250

## 2019-08-09 MED ORDER — VANCOMYCIN HCL IN DEXTROSE 1-5 GM/200ML-% IV SOLN: 1000.0000 mg | Freq: Once | INTRAVENOUS | Status: DC

## 2019-08-09 MED ORDER — ENOXAPARIN SODIUM 60 MG/0.6ML ~~LOC~~ SOLN
50.0000 mg | Freq: Every day | SUBCUTANEOUS | Status: DC
  Administered 2019-08-09 – 2019-08-12 (×4): 50 mg via SUBCUTANEOUS
  Filled 2019-08-09 (×4): qty 0.6

## 2019-08-09 NOTE — Progress Notes (Signed)
Una wraps removed for Dr Verlon Au- Dr Verlon Au reports to leave them off today.

## 2019-08-09 NOTE — Progress Notes (Signed)
IV team consult due to IV infiltrate

## 2019-08-09 NOTE — Progress Notes (Signed)
Hospitalist progress note   Jessica Yates [id number] DOB: 15-Nov-1950 DOA: 08/04/2019  PCP: Sonia Side., FNP   Narrative:  68 year old female, asthma, depression prior DVT 2002 completing anticoagulation, chronic diastolic heart failure last EF 0000000 grade 1 diastolic dysfunction Recently seen in the ED 07/1610/19 redness distal lower extremity.  Started on Keflex-came back to emergency room 11/22 found to have refractory cellulitis and admitted for the same Assessment & Plan: Cellulitis failing outpatient therapy- vancomycin/ceftriaxone -->doxycycline--WBC up and low grade temps-monitor overnight--changed ot IV vanc on 11/26 as rising WBC Keep [name] off today given concerns from patient that legs look darker Resume [name] in am rpt labs am Prior DVT 2002-ultrasound Doppler done 11/23 is negative Microcytic anemia-iron studies normal 11/24-continue PO replaceemnt Chronic diastolic heart failure EF 55-60%--hold lasix tomorrow and check am labs DM TY 2 continue glipizide 10 mg-->2.5 as was hypoglycemic 11/24 outpatient gabapentin 100 twice daily--sugars are 130-156--can continue Hypokalemia replaced with potassium-peridoic labs Reflux continue Protonix 40 daily  overflow incontinence continue Myrbetriq 25 as needed  Subjective: Fair no issues had some low grade tamps last pm No fever sno cp no n V no dysuria no diarr  Consultants:   None Procedures:   Doppler lower extremity 7/22 - for DVT Antimicrobials:   Vancomycin ceftriaxone-->doxy 11/24 Data Reviewed:  Labs White count 8.4-->8.7-->11.7-->13.3 Hemoglobin 8.7->8.7-->9.7-->10.3 Bun creat 28/1.25 up form admit 10/1.07  Radiology Studies: Ultrasound did not show any clot  Objective: Vitals:   08/07/19 2254 08/08/19 2048 08/09/19 0425 08/09/19 0732  BP:  115/65 113/72 105/66  Pulse:  97 85 80  Resp:    16  Temp: 98.7 F (37.1 C) 98.4 F (36.9 C) 98.6 F (37 C) 98.3 F (36.8 C)  TempSrc: Oral Oral  Oral Oral  SpO2:  97% 98% 99%  Weight:      Height:        Intake/Output Summary (Last 24 hours) at 08/09/2019 1302 Last data filed at 08/09/2019 0900 Gross per 24 hour  Intake 240 ml  Output -  Net 240 ml   Filed Weights   08/04/19 1839 08/05/19 1024  Weight: 99.8 kg 99.8 kg    Examination:  no distress EOMI NCAT no focal deficit neck soft supple S1-S2 no murmur rub or gallop Chest clinically clear Abdomen soft nontender no rebound or guarding ROM intact Both LE soft nt nd with some darkening of skin-no oozing clean  Scheduled Meds: . aspirin EC  81 mg Oral Daily  . enoxaparin (LOVENOX) injection  50 mg Subcutaneous Daily  . famotidine  20 mg Oral BID  . feeding supplement (PRO-STAT SUGAR FREE 64)  30 mL Oral BID  . ferrous sulfate  325 mg Oral 3 times weekly  . furosemide  20 mg Intravenous Daily  . glipiZIDE  2.5 mg Oral QAC breakfast  . insulin aspart  0-5 Units Subcutaneous QHS  . insulin aspart  0-6 Units Subcutaneous TID WC  . latanoprost  1 drop Both Eyes QHS  . loratadine  10 mg Oral Daily  . pantoprazole  40 mg Oral Daily  . potassium chloride SA  20 mEq Oral Daily   Continuous Infusions: . sodium chloride Stopped (08/06/19 1124)  . vancomycin 1,250 mg (08/09/19 0900)     LOS: 2 days   Time spent: [place], MD [facility]  08/09/2019, 1:02 PM

## 2019-08-09 NOTE — Progress Notes (Signed)
Pharmacy Antibiotic Note  Jessica Yates is a 68 y.o. female admitted on 08/04/2019 with LLE cellulitis. Pharmacy has been consulted for vancomycin dosing. Cellulitis is extensive throughout the entire lowe extermity of the knee. No improvement on keflex  Tmax 98.6, WBC slightly elevated at 13.3. Renal function is slightly above baseline at 1.25 (baseline is ~ 0.8)  Plan: Vancomycin 1250 mg IV Q24 hrs. Goal AUC 400-550. Expected AUC: 487.9 SCr used: 1.25 Monitor renal function, WBC, temp, and clinical status  Height: 5\' 9"  (175.3 cm) Weight: 220 lb (99.8 kg) IBW/kg (Calculated) : 66.2  Temp (24hrs), Avg:98.4 F (36.9 C), Min:98.3 F (36.8 C), Max:98.6 F (37 C)  Recent Labs  Lab 08/04/19 1853 08/05/19 0604 08/06/19 0356 08/07/19 0317 08/08/19 0337 08/09/19 0419  WBC 9.4  --  8.4 8.7 11.8* 13.3*  CREATININE 0.92  --  1.07* 1.07*  --  1.25*  LATICACIDVEN 1.7 0.9  --   --   --   --     Estimated Creatinine Clearance: 54.1 mL/min (A) (by C-G formula based on SCr of 1.25 mg/dL (H)).    No Active Allergies  Antimicrobials this admission: Vancomycin 11/22>>11/24, 11/26 >> Doxycycline 11/24 >> 11/26 Ceftriaxone 11/22 >>11/24 Cephalexin x 1 11/22  Microbiology results: 11/22 BCx: ngtd x 4 11/22 COVID: Negative  Thank you for allowing pharmacy to be a part of this patient's care.  Sherren Kerns, PharmD PGY1 Acute Care Pharmacy Resident 08/09/2019 8:21 AM

## 2019-08-10 ENCOUNTER — Inpatient Hospital Stay (HOSPITAL_COMMUNITY): Payer: Medicare HMO

## 2019-08-10 DIAGNOSIS — I890 Lymphedema, not elsewhere classified: Secondary | ICD-10-CM

## 2019-08-10 DIAGNOSIS — L03116 Cellulitis of left lower limb: Principal | ICD-10-CM

## 2019-08-10 LAB — CBC WITH DIFFERENTIAL/PLATELET
Abs Immature Granulocytes: 0.05 10*3/uL (ref 0.00–0.07)
Basophils Absolute: 0 10*3/uL (ref 0.0–0.1)
Basophils Relative: 0 %
Eosinophils Absolute: 0.2 10*3/uL (ref 0.0–0.5)
Eosinophils Relative: 2 %
HCT: 34.1 % — ABNORMAL LOW (ref 36.0–46.0)
Hemoglobin: 10.8 g/dL — ABNORMAL LOW (ref 12.0–15.0)
Immature Granulocytes: 1 %
Lymphocytes Relative: 21 %
Lymphs Abs: 2.2 10*3/uL (ref 0.7–4.0)
MCH: 25.1 pg — ABNORMAL LOW (ref 26.0–34.0)
MCHC: 31.7 g/dL (ref 30.0–36.0)
MCV: 79.3 fL — ABNORMAL LOW (ref 80.0–100.0)
Monocytes Absolute: 0.7 10*3/uL (ref 0.1–1.0)
Monocytes Relative: 6 %
Neutro Abs: 7.3 10*3/uL (ref 1.7–7.7)
Neutrophils Relative %: 70 %
Platelets: 387 10*3/uL (ref 150–400)
RBC: 4.3 MIL/uL (ref 3.87–5.11)
RDW: 18 % — ABNORMAL HIGH (ref 11.5–15.5)
WBC: 10.5 10*3/uL (ref 4.0–10.5)
nRBC: 0 % (ref 0.0–0.2)

## 2019-08-10 LAB — RENAL FUNCTION PANEL
Albumin: 2.9 g/dL — ABNORMAL LOW (ref 3.5–5.0)
Anion gap: 11 (ref 5–15)
BUN: 23 mg/dL (ref 8–23)
CO2: 25 mmol/L (ref 22–32)
Calcium: 10.2 mg/dL (ref 8.9–10.3)
Chloride: 104 mmol/L (ref 98–111)
Creatinine, Ser: 0.86 mg/dL (ref 0.44–1.00)
GFR calc Af Amer: >60 mL/min (ref >60)
GFR calc non Af Amer: >60 mL/min (ref >60)
Glucose, Bld: 117 mg/dL — ABNORMAL HIGH (ref 70–99)
Phosphorus: 3.5 mg/dL (ref 2.5–4.6)
Potassium: 4.1 mmol/L (ref 3.5–5.1)
Sodium: 140 mmol/L (ref 135–145)

## 2019-08-10 LAB — CULTURE, BLOOD (ROUTINE X 2): Culture: NO GROWTH

## 2019-08-10 LAB — GLUCOSE, CAPILLARY
Glucose-Capillary: 124 mg/dL — ABNORMAL HIGH (ref 70–99)
Glucose-Capillary: 138 mg/dL — ABNORMAL HIGH (ref 70–99)
Glucose-Capillary: 101 mg/dL — ABNORMAL HIGH (ref 70–99)
Glucose-Capillary: 115 mg/dL — ABNORMAL HIGH (ref 70–99)

## 2019-08-10 MED ORDER — DOXYCYCLINE HYCLATE 100 MG PO TABS
100.0000 mg | ORAL_TABLET | Freq: Two times a day (BID) | ORAL | Status: DC
  Administered 2019-08-10 – 2019-08-12 (×5): 100 mg via ORAL
  Filled 2019-08-10 (×5): qty 1

## 2019-08-10 MED ORDER — HYDROCERIN EX CREA
TOPICAL_CREAM | Freq: Two times a day (BID) | CUTANEOUS | Status: DC
  Administered 2019-08-10 – 2019-08-12 (×4): via TOPICAL
  Filled 2019-08-10 (×3): qty 113

## 2019-08-10 MED ORDER — CEFDINIR 300 MG PO CAPS
300.0000 mg | ORAL_CAPSULE | Freq: Two times a day (BID) | ORAL | Status: DC
  Administered 2019-08-10 – 2019-08-12 (×5): 300 mg via ORAL
  Filled 2019-08-10 (×6): qty 1

## 2019-08-10 NOTE — Consult Note (Signed)
[facility] Nurse Consult Note: Patient receiving care in [place]. Consult completed remotely after review of record and images. Reason for Consult: "cellulits on legs".  [facility] nurses do not have the scope of practice to treat cellulitis.  However, I see in the note from Dr. Nada Libman from 08/09/19 at 1302, that he wanted the unna boots to be left off on 11/26 and to resume unna boots in the a.m. on 11/27.   Therefore, I have placed an order for the unna boots to be placed, which is done by [facility]. Dressing procedure/placement/frequency: unna boots bilaterally to be changed weekly. Val Riles, RN, MSN, CWOCN, CNS-BC, pager [phone]

## 2019-08-10 NOTE — Progress Notes (Signed)
Orthopedic Tech Progress Note Patient Details:  Jessica Yates Dec 30, 1950 [id number] Had a conversation with the RN around 1350 about patient [name], she said could I come and apply them around 3ish. Came upstairs and the patient wanted to talk with the [facility] before I apply the boots I Told the RN. Patient ID: Jessica Yates, female   DOB: 04-09-1951, 68 y.o.   MRN: [id number]   [name] 08/10/2019, 5:17 PM

## 2019-08-10 NOTE — Progress Notes (Signed)
PROGRESS NOTE    Jessica Yates  [id number] DOB: 1950/10/17 DOA: 08/04/2019 PCP: Sonia Side., FNP   Brief Narrative: Jessica Yates is a 68 y.o. female with a history of asthma, depression, remote DVT, chronic diastolic heart failure. Patient presented secondary to cellulitis which has responded to antibiotics.   Assessment & Plan:   Principal Problem:   Cellulitis Active Problems:   Lymphedema   Cellulitis, leg    Cellulitis Appears improved. Vancomycin restarted on 11/26 secondary to elevated WBC. Clinically appears resolved. Started on unna boots. Leukocytosis improved. -Transition to Doxycycline and Cefdinir to complete 7 day course -Continue unna boots  Stasis dermatitis Patient has a history of lymphedema. Patient with skin changes that she states is acute. Appears to be secondary to long standing lymphedema.  Left thigh nodule Unsure of etiology. Slightly tender. -Left leg soft tissue ultrasound  Microcytic anemia Stable. Patient is on iron.  Diabetes mellitus, type 2 Patient is on glipizide.  Hypokalemia Resolved.  GERD On Pepcid and Prilosec as an outpateint -Continue Protonix daily  Overflow incontinence -Myrbetriq   DVT prophylaxis: Lovenox Code Status:   Code Status: Full Code Family Communication: None at bedside Disposition Plan: Discharge likely in 24 hours if stable   Consultants:   None  Procedures:   None  Antimicrobials:  Vancomycin  Ceftriaxone  Doxycycline  Cefdinir    Subjective: Patient reports no issues overnight. Afebrile.  Objective: Vitals:   08/09/19 1438 08/09/19 2014 08/10/19 0357 08/10/19 0737  BP: 113/71 105/71 103/62 109/63  Pulse: 87 84 75 85  Resp: 17 17 17 17   Temp: 98.3 F (36.8 C) 98.4 F (36.9 C) 98.5 F (36.9 C) 98.5 F (36.9 C)  TempSrc: Oral Oral Oral Oral  SpO2: 99% 100% 99% 99%  Weight:      Height:        Intake/Output Summary (Last 24 hours) at 08/10/2019 1406  Last data filed at 08/10/2019 0900 Gross per 24 hour  Intake 720 ml  Output -  Net 720 ml   Filed Weights   08/04/19 1839 08/05/19 1024  Weight: 99.8 kg 99.8 kg    Examination:  General exam: Appears calm and comfortable Respiratory system: Clear to auscultation. Respiratory effort normal. Cardiovascular system: S1 & S2 heard, RRR. No murmurs, rubs, gallops or clicks. Gastrointestinal system: Abdomen is nondistended, soft and nontender. No organomegaly or masses felt. Normal bowel sounds heard. Central nervous system: Alert and oriented. No focal neurological deficits. Extremities: No edema. No calf tenderness. Nodule felt on left medial thigh that is mildly tender and does not feel fluctuant Skin: No cyanosis. Significant hyperpigmentation and scaly skin Psychiatry: Judgement and insight appear normal. Mood & affect appropriate.     Data Reviewed: I have personally reviewed following labs and imaging studies  CBC: Recent Labs  Lab 08/04/19 1853 08/06/19 0356 08/07/19 0317 08/08/19 0337 08/09/19 0419 08/10/19 0341  WBC 9.4 8.4 8.7 11.8* 13.3* 10.5  NEUTROABS 6.9  --  5.7 8.5* 10.1* 7.3  HGB 9.8* 8.7* 8.7* 9.7* 10.3* 10.8*  HCT 32.0* 27.0* 27.4* 30.7* 32.8* 34.1*  MCV 81.4 78.5* 78.7* 79.7* 79.2* 79.3*  PLT 346 339 358 397 428* [id number]   Basic Metabolic Panel: Recent Labs  Lab 08/04/19 1853 08/06/19 0356 08/07/19 0317 08/09/19 0419 08/10/19 0341  NA 136 140 141 137 140  K 3.4* 4.2 4.6 4.0 4.1  CL 102 105 106 101 104  CO2 24 28 26 27 25   GLUCOSE 132* 99 119*  157* 117*  BUN 5* 10 16 28* 23  CREATININE 0.92 1.07* 1.07* 1.25* 0.86  CALCIUM 9.5 9.4 9.6 10.1 10.2  PHOS  --   --   --  3.4 3.5   GFR: Estimated Creatinine Clearance: 78.7 mL/min (by C-G formula based on SCr of 0.86 mg/dL). Liver Function Tests: Recent Labs  Lab 08/04/19 1853 08/06/19 0356 08/09/19 0419 08/10/19 0341  AST 14* 11*  --   --   ALT 16 12  --   --   ALKPHOS 98 73  --   --   BILITOT  0.6 0.6  --   --   PROT 7.3 6.3*  --   --   ALBUMIN 3.1* 2.5* 2.9* 2.9*   No results for input(s): LIPASE, AMYLASE in the last 168 hours. No results for input(s): AMMONIA in the last 168 hours. Coagulation Profile: Recent Labs  Lab 08/04/19 1853  INR 0.9   Cardiac Enzymes: No results for input(s): CKTOTAL, CKMB, CKMBINDEX, TROPONINI in the last 168 hours. BNP (last 3 results) No results for input(s): PROBNP in the last 8760 hours. HbA1C: No results for input(s): HGBA1C in the last 72 hours. CBG: Recent Labs  Lab 08/09/19 1147 08/09/19 1634 08/09/19 2016 08/10/19 0631 08/10/19 1149  GLUCAP 136* 112* 91 124* 138*   Lipid Profile: No results for input(s): CHOL, HDL, LDLCALC, TRIG, CHOLHDL, LDLDIRECT in the last 72 hours. Thyroid Function Tests: No results for input(s): TSH, T4TOTAL, FREET4, T3FREE, THYROIDAB in the last 72 hours. Anemia Panel: No results for input(s): VITAMINB12, FOLATE, FERRITIN, TIBC, IRON, RETICCTPCT in the last 72 hours. Sepsis Labs: Recent Labs  Lab 08/04/19 1853 08/05/19 0604  LATICACIDVEN 1.7 0.9    Recent Results (from the past 240 hour(s))  Culture, blood (Routine x 2)     Status: None   Collection Time: 08/04/19  7:06 PM   Specimen: BLOOD  Result Value Ref Range Status   Specimen Description BLOOD LEFT ANTECUBITAL  Final   Special Requests   Final    BOTTLES DRAWN AEROBIC AND ANAEROBIC Blood Culture results may not be optimal due to an inadequate volume of blood received in culture bottles   Culture   Final    NO GROWTH 5 DAYS Performed at [facility], [place] [address], [place], [place] [address]    Report Status 08/09/2019 FINAL  Final  SARS CORONAVIRUS 2 (TAT 6-24 HRS) Nasopharyngeal Nasopharyngeal Swab     Status: None   Collection Time: 08/05/19  5:00 AM   Specimen: Nasopharyngeal Swab  Result Value Ref Range Status   SARS Coronavirus 2 NEGATIVE NEGATIVE Final    Comment: (NOTE) SARS-CoV-2 target nucleic acids are NOT  DETECTED. The SARS-CoV-2 RNA is generally detectable in upper and lower respiratory specimens during the acute phase of infection. Negative results do not preclude SARS-CoV-2 infection, do not rule out co-infections with other pathogens, and should not be used as the sole basis for treatment or other patient management decisions. Negative results must be combined with clinical observations, patient history, and epidemiological information. The expected result is Negative. Fact Sheet for Patients: SugarRoll.be Fact Sheet for Healthcare Providers: https://www.woods-mathews.com/ This test is not yet approved or cleared by the [place] FDA and  has been authorized for detection and/or diagnosis of SARS-CoV-2 by FDA under an Emergency Use Authorization (EUA). This EUA will remain  in effect (meaning this test can be used) for the duration of the COVID-19 declaration under Section 56 4(b)(1) of the Act,  21 U.S.C. section 360bbb-3(b)(1), unless the authorization is terminated or revoked sooner. Performed at [facility], [place] [address], [place], [place] [address]   Culture, blood (Routine x 2)     Status: None   Collection Time: 08/05/19  6:04 AM   Specimen: BLOOD RIGHT FOREARM  Result Value Ref Range Status   Specimen Description BLOOD RIGHT FOREARM  Final   Special Requests   Final    BOTTLES DRAWN AEROBIC AND ANAEROBIC Blood Culture results may not be optimal due to an inadequate volume of blood received in culture bottles   Culture   Final    NO GROWTH 5 DAYS Performed at [facility], [place] [address] [place], [place] [address]    Report Status 08/10/2019 FINAL  Final         Radiology Studies: No results found.      Scheduled Meds: . aspirin EC  81 mg Oral Daily  . cefdinir  300 mg Oral Q12H  . doxycycline  100 mg Oral Q12H  . enoxaparin (LOVENOX) injection  50 mg Subcutaneous Daily  . famotidine  20 mg Oral  BID  . feeding supplement (PRO-STAT SUGAR FREE 64)  30 mL Oral BID  . ferrous sulfate  325 mg Oral 3 times weekly  . glipiZIDE  2.5 mg Oral QAC breakfast  . insulin aspart  0-5 Units Subcutaneous QHS  . insulin aspart  0-6 Units Subcutaneous TID WC  . latanoprost  1 drop Both Eyes QHS  . loratadine  10 mg Oral Daily  . pantoprazole  40 mg Oral Daily  . potassium chloride SA  20 mEq Oral Daily   Continuous Infusions: . sodium chloride Stopped (08/06/19 1124)     LOS: 3 days     Cordelia Poche, MD [facility] 08/10/2019, 2:06 PM  If 7PM-7AM, please contact night-coverage [email or web address]

## 2019-08-11 ENCOUNTER — Inpatient Hospital Stay (HOSPITAL_COMMUNITY): Payer: Medicare HMO

## 2019-08-11 LAB — GLUCOSE, CAPILLARY
Glucose-Capillary: 115 mg/dL — ABNORMAL HIGH (ref 70–99)
Glucose-Capillary: 121 mg/dL — ABNORMAL HIGH (ref 70–99)
Glucose-Capillary: 90 mg/dL (ref 70–99)
Glucose-Capillary: 121 mg/dL — ABNORMAL HIGH (ref 70–99)

## 2019-08-11 MED ORDER — LIDOCAINE HCL (PF) 1 % IJ SOLN
INTRAMUSCULAR | Status: AC
  Filled 2019-08-11: qty 30

## 2019-08-11 NOTE — Progress Notes (Signed)
PROGRESS NOTE    Jessica Yates  [id number] DOB: [birth year] DOA: 08/04/2019 PCP: Sonia Side., FNP   Brief Narrative: Jessica Yates is a 68 y.o. female with a history of asthma, depression, remote DVT, chronic diastolic heart failure. Patient presented secondary to cellulitis which has responded to antibiotics.   Assessment & Plan:   Principal Problem:   Cellulitis Active Problems:   Lymphedema   Cellulitis, leg    Cellulitis Appears improved. Vancomycin restarted on 11/26 secondary to elevated WBC. Clinically appears resolved. Started on unna boots. Leukocytosis improved. -Continue Doxycycline and Cefdinir to complete 7 day course -Continue unna boots  Stasis dermatitis Patient has a history of lymphedema. Patient with skin changes that she states is acute. Appears to be secondary to long standing lymphedema.  Left thigh hematoma Ultrasound was suggestive of cystic lesion. Discussed with IR who performed aspiration which was consistent with hematoma. -Culture pending  Microcytic anemia Stable. Patient is on iron.  Diabetes mellitus, type 2 Patient is on glipizide.  Hypokalemia Resolved.  GERD On Pepcid and Prilosec as an outpateint -Continue Protonix daily  Overflow incontinence -Myrbetriq   DVT prophylaxis: Lovenox Code Status:   Code Status: Full Code Family Communication: None at bedside Disposition Plan: Discharge tomorrow once [name] placed and HH available   Consultants:   None  Procedures:   None  Antimicrobials:  Vancomycin  Ceftriaxone  Doxycycline  Cefdinir    Subjective: No concerns overnight.  Objective: Vitals:   08/10/19 0737 08/10/19 2012 08/11/19 0314 08/11/19 0850  BP: 109/63 111/78 116/72 110/70  Pulse: 85 86 81 88  Resp: 17 17 17 17   Temp: 98.5 F (36.9 C) 98.6 F (37 C) 98.4 F (36.9 C) 98.4 F (36.9 C)  TempSrc: Oral Oral Oral Oral  SpO2: 99% 97% 99% 98%  Weight:      Height:        Intake/Output Summary (Last 24 hours) at 08/11/2019 1605 Last data filed at 08/10/2019 1700 Gross per 24 hour  Intake 240 ml  Output -  Net 240 ml   Filed Weights   08/04/19 1839 08/05/19 1024  Weight: 99.8 kg 99.8 kg    Examination:  General exam: Appears calm and comfortable Respiratory system: Clear to auscultation. Respiratory effort normal. Cardiovascular system: S1 & S2 heard, RRR. No murmurs, rubs, gallops or clicks. Gastrointestinal system: Abdomen is nondistended, soft and nontender. No organomegaly or masses felt. Normal bowel sounds heard. Central nervous system: Alert and oriented. No focal neurological deficits. Extremities: Bilateral legs are large with no pitting edema and hyperpigmented skin on bilateral dorsi of feet Skin: No cyanosis. No rashes Psychiatry: Judgement and insight appear normal. Mood & affect appropriate.     Data Reviewed: I have personally reviewed following labs and imaging studies  CBC: Recent Labs  Lab 08/04/19 1853 08/06/19 0356 08/07/19 0317 08/08/19 0337 08/09/19 0419 08/10/19 0341  WBC 9.4 8.4 8.7 11.8* 13.3* 10.5  NEUTROABS 6.9  --  5.7 8.5* 10.1* 7.3  HGB 9.8* 8.7* 8.7* 9.7* 10.3* 10.8*  HCT 32.0* 27.0* 27.4* 30.7* 32.8* 34.1*  MCV 81.4 78.5* 78.7* 79.7* 79.2* 79.3*  PLT 346 339 358 397 428* [id number]   Basic Metabolic Panel: Recent Labs  Lab 08/04/19 1853 08/06/19 0356 08/07/19 0317 08/09/19 0419 08/10/19 0341  NA 136 140 141 137 140  K 3.4* 4.2 4.6 4.0 4.1  CL 102 105 106 101 104  CO2 24 28 26 27 25   GLUCOSE 132* 99 119* 157*  117*  BUN 5* 10 16 28* 23  CREATININE 0.92 1.07* 1.07* 1.25* 0.86  CALCIUM 9.5 9.4 9.6 10.1 10.2  PHOS  --   --   --  3.4 3.5   GFR: Estimated Creatinine Clearance: 78.7 mL/min (by C-G formula based on SCr of 0.86 mg/dL). Liver Function Tests: Recent Labs  Lab 08/04/19 1853 08/06/19 0356 08/09/19 0419 08/10/19 0341  AST 14* 11*  --   --   ALT 16 12  --   --   ALKPHOS 98 73  --   --    BILITOT 0.6 0.6  --   --   PROT 7.3 6.3*  --   --   ALBUMIN 3.1* 2.5* 2.9* 2.9*   No results for input(s): LIPASE, AMYLASE in the last 168 hours. No results for input(s): AMMONIA in the last 168 hours. Coagulation Profile: Recent Labs  Lab 08/04/19 1853  INR 0.9   Cardiac Enzymes: No results for input(s): CKTOTAL, CKMB, CKMBINDEX, TROPONINI in the last 168 hours. BNP (last 3 results) No results for input(s): PROBNP in the last 8760 hours. HbA1C: No results for input(s): HGBA1C in the last 72 hours. CBG: Recent Labs  Lab 08/10/19 1149 08/10/19 1643 08/10/19 2027 08/11/19 0642 08/11/19 1229  GLUCAP 138* 101* 115* 115* 121*   Lipid Profile: No results for input(s): CHOL, HDL, LDLCALC, TRIG, CHOLHDL, LDLDIRECT in the last 72 hours. Thyroid Function Tests: No results for input(s): TSH, T4TOTAL, FREET4, T3FREE, THYROIDAB in the last 72 hours. Anemia Panel: No results for input(s): VITAMINB12, FOLATE, FERRITIN, TIBC, IRON, RETICCTPCT in the last 72 hours. Sepsis Labs: Recent Labs  Lab 08/04/19 1853 08/05/19 0604  LATICACIDVEN 1.7 0.9    Recent Results (from the past 240 hour(s))  Culture, blood (Routine x 2)     Status: None   Collection Time: 08/04/19  7:06 PM   Specimen: BLOOD  Result Value Ref Range Status   Specimen Description BLOOD LEFT ANTECUBITAL  Final   Special Requests   Final    BOTTLES DRAWN AEROBIC AND ANAEROBIC Blood Culture results may not be optimal due to an inadequate volume of blood received in culture bottles   Culture   Final    NO GROWTH 5 DAYS Performed at [facility], [place] [address] [place], [place] [address]    Report Status 08/09/2019 FINAL  Final  SARS CORONAVIRUS 2 (TAT 6-24 HRS) Nasopharyngeal Nasopharyngeal Swab     Status: None   Collection Time: 08/05/19  5:00 AM   Specimen: Nasopharyngeal Swab  Result Value Ref Range Status   SARS Coronavirus 2 NEGATIVE NEGATIVE Final    Comment: (NOTE) SARS-CoV-2 target nucleic acids  are NOT DETECTED. The SARS-CoV-2 RNA is generally detectable in upper and lower respiratory specimens during the acute phase of infection. Negative results do not preclude SARS-CoV-2 infection, do not rule out co-infections with other pathogens, and should not be used as the sole basis for treatment or other patient management decisions. Negative results must be combined with clinical observations, patient history, and epidemiological information. The expected result is Negative. Fact Sheet for Patients: SugarRoll.be Fact Sheet for Healthcare Providers: https://www.woods-mathews.com/ This test is not yet approved or cleared by the [place] FDA and  has been authorized for detection and/or diagnosis of SARS-CoV-2 by FDA under an Emergency Use Authorization (EUA). This EUA will remain  in effect (meaning this test can be used) for the duration of the COVID-19 declaration under Section 56 4(b)(1) of the Act, 21  U.S.C. section 360bbb-3(b)(1), unless the authorization is terminated or revoked sooner. Performed at [facility], [place] [address] [place], [place] [address]   Culture, blood (Routine x 2)     Status: None   Collection Time: 08/05/19  6:04 AM   Specimen: BLOOD RIGHT FOREARM  Result Value Ref Range Status   Specimen Description BLOOD RIGHT FOREARM  Final   Special Requests   Final    BOTTLES DRAWN AEROBIC AND ANAEROBIC Blood Culture results may not be optimal due to an inadequate volume of blood received in culture bottles   Culture   Final    NO GROWTH 5 DAYS Performed at [facility], [place] [address], [place], [place] [address]    Report Status 08/10/2019 FINAL  Final         Radiology Studies: US Guided Needle Placement  Result Date: 08/11/2019 INDICATION: Focal abnormality of subcutaneous soft tissues of the medial mid left thigh measuring approximately 2.3 cm in greatest diameter by prior ultrasound.  Request has been made to aspirate/sample of the abnormality. EXAM: ULTRASOUND GUIDED ASPIRATION LEFT THIGH SOFT TISSUE FLUID COLLECTION MEDICATIONS: None ANESTHESIA/SEDATION: None COMPLICATIONS: None immediate. PROCEDURE: Informed written consent was obtained from the patient after a thorough discussion of the procedural risks, benefits and alternatives. All questions were addressed. Maximal Sterile Barrier Technique was utilized including caps, mask, sterile gowns, sterile gloves, sterile drape, hand hygiene and skin antiseptic. A timeout was performed prior to the initiation of the procedure. Ultrasound was performed to localize the right medial thigh soft tissue abnormality. Overlying skin was prepped with chlorhexidine. Local anesthesia was provided with 1% lidocaine. Under direct ultrasound guidance, a 5 [place] Yueh centesis catheter was inserted into the abnormality over a 19 gauge needle. Aspiration was performed. Lavage was performed with 2 mL of sterile saline. The fluid sample was sent for culture analysis. FINDINGS: Well-circumscribed oval shaped soft tissue abnormality again localized with no change since prior soft tissue ultrasound yesterday. Aspiration yielded a few drops of old dark liquified blood. Saline lavage yielded 2 mL of dark bloody fluid. This resulted in some partial decompression of the abnormality which appears smaller after aspiration but could not be completely decompressed. Findings are likely consistent with hematoma. The fluid sample was sent for culture analysis. IMPRESSION: Ultrasound guided aspiration of left medial thigh complex fluid collection yielding dark blood with findings likely consistent with hematoma. Saline lavage was performed with 2 mL of dark bloody fluid sent for culture analysis. This resulted in partial decompression of the collection with residual abnormality likely representing clot. Electronically Signed   By: Aletta Edouard M.D.   On: 08/11/2019 12:24   [place]  Lt Lower Extrem Ltd Soft Tissue Non Vascular  Result Date: 08/10/2019 CLINICAL DATA:  Medial left thigh pain. EXAM: ULTRASOUND LEFT LOWER EXTREMITY LIMITED TECHNIQUE: Ultrasound examination of the lower extremity soft tissues was performed in the area of clinical concern. COMPARISON:  None. FINDINGS: Scanning was directed toward the region of concern as indicated by the patient. In the subcutaneous fatty tissues, a predominantly hypoechoic lesion measuring 2.3 x 1.5 x 2.3 cm is identified. The lesion has some septations and there is some flow within it on Doppler imaging. IMPRESSION: Cystic lesion in the region of concern is nonspecific and could represent an abscess or neoplasm. It should be amenable to sampling. Alternatively, MRI with and without contrast could be used for further evaluation. Electronically Signed   By: Inge Rise M.D.   On: 08/10/2019  14:35        Scheduled Meds: . aspirin EC  81 mg Oral Daily  . cefdinir  300 mg Oral Q12H  . doxycycline  100 mg Oral Q12H  . enoxaparin (LOVENOX) injection  50 mg Subcutaneous Daily  . famotidine  20 mg Oral BID  . feeding supplement (PRO-STAT SUGAR FREE 64)  30 mL Oral BID  . ferrous sulfate  325 mg Oral 3 times weekly  . glipiZIDE  2.5 mg Oral QAC breakfast  . hydrocerin   Topical BID  . insulin aspart  0-5 Units Subcutaneous QHS  . insulin aspart  0-6 Units Subcutaneous TID WC  . latanoprost  1 drop Both Eyes QHS  . lidocaine (PF)      . loratadine  10 mg Oral Daily  . pantoprazole  40 mg Oral Daily  . potassium chloride SA  20 mEq Oral Daily   Continuous Infusions: . sodium chloride Stopped (08/09/19 2123)     LOS: 4 days     Cordelia Poche, MD [facility] 08/11/2019, 4:05 PM  If 7PM-7AM, please contact night-coverage [email or web address]

## 2019-08-11 NOTE — Plan of Care (Signed)
  Problem: Clinical Measurements: °Goal: Will remain free from infection °Outcome: Progressing °  °Problem: Nutrition: °Goal: Adequate nutrition will be maintained °Outcome: Progressing °  °Problem: Coping: °Goal: Level of anxiety will decrease °Outcome: Progressing °  °Problem: Elimination: °Goal: Will not experience complications related to urinary retention °Outcome: Progressing °  °Problem: Pain Managment: °Goal: General experience of comfort will improve °Outcome: Progressing °  °Problem: Safety: °Goal: Ability to remain free from injury will improve °Outcome: Progressing °  °Problem: Skin Integrity: °Goal: Risk for impaired skin integrity will decrease °Outcome: Progressing °  °

## 2019-08-11 NOTE — Progress Notes (Signed)
Orthopedic Tech Progress Note Patient Details:  Jessica Yates Oct 15, 1950 [id number]  Ortho Devices Type of Ortho Device: Louretta Parma boot Ortho Device/Splint Location: bi-lateral Ortho Device/Splint Interventions: Ordered, Application, Adjustment   Post Interventions Patient Tolerated: Well Instructions Provided: Care of device, Adjustment of device   Karolee Stamps 08/11/2019, 8:42 PM

## 2019-08-11 NOTE — Procedures (Signed)
Interventional Radiology Procedure Note  Procedure: US guided aspiration of left thigh fluid collection  Complications: None  Estimated Blood Loss: None  Findings: Aspiration of medial thigh soft tissue abnormality yielded 2 mL of dark appearing blood. Collection smaller after aspiration. Unable to completely decompress. Fluid sent for culture. Findings likely c/w hematoma.  Venetia Night. Kathlene Cote, M.D Pager:  [phone]

## 2019-08-12 LAB — GLUCOSE, CAPILLARY
Glucose-Capillary: 118 mg/dL — ABNORMAL HIGH (ref 70–99)
Glucose-Capillary: 114 mg/dL — ABNORMAL HIGH (ref 70–99)

## 2019-08-12 MED ORDER — HYDROCERIN EX CREA: 1.0000 "application " | TOPICAL_CREAM | Freq: Two times a day (BID) | CUTANEOUS | 0 refills | Status: DC

## 2019-08-12 NOTE — Plan of Care (Signed)
  Problem: Clinical Measurements: Goal: Will remain free from infection Outcome: Progressing   Problem: Coping: Goal: Level of anxiety will decrease Outcome: Progressing   Problem: Pain Managment: Goal: General experience of comfort will improve Outcome: Progressing   Problem: Safety: Goal: Ability to remain free from injury will improve Outcome: Progressing   Problem: Skin Integrity: Goal: Risk for impaired skin integrity will decrease Outcome: Progressing   

## 2019-08-12 NOTE — TOC Transition Note (Signed)
Transition of Care [facility]) - CM/SW Discharge Note   Patient Details  Name: FREDRICK MCBRYDE MRN: [id number] Date of Birth: [birth year]  [facility]) CM/SW Contact:  Claudie Leach, RN Phone Number: [phone] 08/12/2019, 11:40 AM   Clinical Narrative:    Pt to d/c home with previous home health services of [facility] [address].  Patient requests [place] aide in addition to RN for [employer].     Final next level of care: [place] Barriers to Discharge: No Barriers Identified   Patient Goals and CMS Choice Patient states their goals for this hospitalization and ongoing recovery are:: to get home CMS Medicare.gov Compare Post Acute Care list provided to:: Patient Choice offered to / list presented to : Patient   Discharge Plan and Services     Post Acute Care Choice: Home Health             HH Arranged: RN, Nurse's Aide [facility] Agency: [place] Date [place]: 08/12/19 Time [place]: [id number] Representative spoke with at [place]: Tommi Rumps

## 2019-08-12 NOTE — Discharge Summary (Signed)
Physician Discharge Summary  Jessica Yates KHT:[id number] DOB: 1951/01/27 DOA: 08/04/2019  PCP: Sonia Side., FNP  Admit date: 08/04/2019 Discharge date: 08/12/2019  Admitted From: Home Disposition: Home  Recommendations for Outpatient Follow-up:  1. Follow up with PCP in 1 week 2. Consider dermatology follow-up 3. Please obtain BMP/CBC in one week 4. Please follow up on the following pending results: Wound culture  Home Health: RN Equipment/Devices: None  Discharge Condition: Stable CODE STATUS: Full code Diet recommendation: Carb modified   Brief/Interim Summary:  Admission HPI written by Jani Gravel, MD    HPI:   Jessica Yates  is a 68 y.o. female,  history of chronic lymphedema, diastolic dysfunction ? CHF,  history of DVT of the left lower extremity, Anemia, apparently recently seen in ED 11/16, and 11/19 for redness of the left distal lower ext apparently tx with keflex was concerned due to her left distal lower ext swelling and redness not improving.  Pt denies fever, chills, cp, palp, sob, n/v, diarrhea, brbpr.   In Ed,  T 98.8 P 98 R 16, Bp 134/82  Pox 98% on RA Wt 99.8kg  Wbc 9.4, Hgb 9.8, Plt 346 INR 0.9  Na 136, K 3.4, Bun 5, Creatinine 0.92 Ast 14, Alt 16  Blood culture x2 pending  Pt given vanco iv in ED    Hospital course:  Cellulitis Patient empirically started on Vancomycin and Ceftriaxone. She was then transitioned to Doxycycline and Keflex with mildly worsened WBC. Vancomycin restarted for one day but transitioned back to Doxycycline and Cefdinir added. Patient completed 7 day course of antibiotics. Repeat CBC/BMP as an outpatient.  Stasis dermatitis Patient has a history of lymphedema. Patient with skin changes that she states is acute. Appears to be secondary to long standing lymphedema. Outpatient follow-up.  Left thigh hematoma Ultrasound was suggestive of cystic lesion. Discussed with IR who performed aspiration  which was consistent with hematoma. Culture obtained and is pending on discharge but is likely non-infectious.  Microcytic anemia Stable. Patient is on iron.  Diabetes mellitus, type 2 Patient is on glipizide.  Hypokalemia Resolved.  GERD On Pepcid and Prilosec as an outpatient. Continue.  Overflow incontinence Myrbetriq.   Discharge Diagnoses:  Principal Problem:   Cellulitis Active Problems:   Lymphedema   Cellulitis, leg    Discharge Instructions  Discharge Instructions    Call MD for:  redness, tenderness, or signs of infection (pain, swelling, redness, odor or green/yellow discharge around incision site)   Complete by: As directed    Call MD for:  temperature >100.4   Complete by: As directed    Increase activity slowly   Complete by: As directed      Allergies as of 08/12/2019   No Active Allergies     Medication List    STOP taking these medications   cephALEXin 500 MG capsule Commonly known as: KEFLEX     TAKE these medications   acetaminophen 500 MG tablet Commonly known as: TYLENOL Take 1,000 mg by mouth every 6 (six) hours as needed for mild pain.   aspirin 81 MG tablet Take 81 mg by mouth daily.   blood glucose meter kit and supplies Kit Dispense based on patient and insurance preference. Use up to four times daily as directed. (FOR ICD-9 250.00, 250.01).   famotidine 20 MG tablet Commonly known as: PEPCID Take 20 mg by mouth 2 (two) times daily.   ferrous sulfate 325 (65 FE) MG tablet Take 325 mg  by mouth 3 (three) times a week.   fluticasone 50 MCG/ACT nasal spray Commonly known as: FLONASE Place 1-2 sprays into both nostrils daily as needed for allergies or rhinitis.   furosemide 40 MG tablet Commonly known as: LASIX '40mg'$  twice daily for 5 days, then '40mg'$  once daily. What changed:   how much to take  how to take this  when to take this  additional instructions   gabapentin 100 MG capsule Commonly known as:  NEURONTIN Take 1 capsule (100 mg total) by mouth 2 (two) times daily.   glipiZIDE 10 MG tablet Commonly known as: GLUCOTROL Take 10 mg by mouth daily before breakfast.   hydrocerin Crea Apply 1 application topically 2 (two) times daily.   ipratropium 0.03 % nasal spray Commonly known as: ATROVENT Place 2 sprays into the nose daily as needed for rhinitis.   Myrbetriq 25 MG Tb24 tablet Generic drug: mirabegron ER Take 25 mg by mouth daily as needed (for urinary issues).   omeprazole 40 MG capsule Commonly known as: PRILOSEC Take 40 mg by mouth daily.   potassium chloride SA 20 MEQ tablet Commonly known as: KLOR-CON Take 20 mEq by mouth daily.   traMADol 50 MG tablet Commonly known as: ULTRAM Take 50 mg by mouth daily as needed (for pain).   Travatan Z 0.004 % Soln ophthalmic solution Generic drug: Travoprost (BAK Free) Place 1 drop into both eyes at bedtime.   triamcinolone cream 0.1 % Commonly known as: KENALOG Apply 1 application topically daily as needed (to affected, irritated sites).   Vitamin D3 25 MCG (1000 UT) Caps Take 1 capsule (1,000 Units total) by mouth daily. What changed: when to take this   Voltaren 1 % Gel Generic drug: diclofenac Sodium Apply 2-4 g topically 4 (four) times daily as needed (for pain).      Follow-up Information    Sonia Side., FNP. Schedule an appointment as soon as possible for a visit in 1 week(s).   Specialty: Family Medicine Why: Hospital follow-up Contact information: [place] [address] [phone]          No Active Allergies  Consultations:  None   Procedures/Studies: Dg Chest 2 View  Result Date: 07/29/2019 CLINICAL DATA:  Fever EXAM: CHEST - 2 VIEW COMPARISON:  07/22/2018 FINDINGS: No focal airspace disease or pleural effusion. Stable cardiomediastinal silhouette. No pneumothorax. IMPRESSION: No active cardiopulmonary disease. Electronically Signed   By: Donavan Foil M.D.   On:  07/29/2019 21:32   US Guided Needle Placement  Result Date: 08/11/2019 INDICATION: Focal abnormality of subcutaneous soft tissues of the medial mid left thigh measuring approximately 2.3 cm in greatest diameter by prior ultrasound. Request has been made to aspirate/sample of the abnormality. EXAM: ULTRASOUND GUIDED ASPIRATION LEFT THIGH SOFT TISSUE FLUID COLLECTION MEDICATIONS: None ANESTHESIA/SEDATION: None COMPLICATIONS: None immediate. PROCEDURE: Informed written consent was obtained from the patient after a thorough discussion of the procedural risks, benefits and alternatives. All questions were addressed. Maximal Sterile Barrier Technique was utilized including caps, mask, sterile gowns, sterile gloves, sterile drape, hand hygiene and skin antiseptic. A timeout was performed prior to the initiation of the procedure. Ultrasound was performed to localize the right medial thigh soft tissue abnormality. Overlying skin was prepped with chlorhexidine. Local anesthesia was provided with 1% lidocaine. Under direct ultrasound guidance, a 5 [place] Yueh centesis catheter was inserted into the abnormality over a 19 gauge needle. Aspiration was performed. Lavage was performed with 2 mL of sterile saline.  The fluid sample was sent for culture analysis. FINDINGS: Well-circumscribed oval shaped soft tissue abnormality again localized with no change since prior soft tissue ultrasound yesterday. Aspiration yielded a few drops of old dark liquified blood. Saline lavage yielded 2 mL of dark bloody fluid. This resulted in some partial decompression of the abnormality which appears smaller after aspiration but could not be completely decompressed. Findings are likely consistent with hematoma. The fluid sample was sent for culture analysis. IMPRESSION: Ultrasound guided aspiration of left medial thigh complex fluid collection yielding dark blood with findings likely consistent with hematoma. Saline lavage was performed with 2  mL of dark bloody fluid sent for culture analysis. This resulted in partial decompression of the collection with residual abnormality likely representing clot. Electronically Signed   By: Aletta Edouard M.D.   On: 08/11/2019 12:24   [place] Lt Lower Extrem Ltd Soft Tissue Non Vascular  Result Date: 08/10/2019 CLINICAL DATA:  Medial left thigh pain. EXAM: ULTRASOUND LEFT LOWER EXTREMITY LIMITED TECHNIQUE: Ultrasound examination of the lower extremity soft tissues was performed in the area of clinical concern. COMPARISON:  None. FINDINGS: Scanning was directed toward the region of concern as indicated by the patient. In the subcutaneous fatty tissues, a predominantly hypoechoic lesion measuring 2.3 x 1.5 x 2.3 cm is identified. The lesion has some septations and there is some flow within it on Doppler imaging. IMPRESSION: Cystic lesion in the region of concern is nonspecific and could represent an abscess or neoplasm. It should be amenable to sampling. Alternatively, MRI with and without contrast could be used for further evaluation. Electronically Signed   By: Inge Rise M.D.   On: 08/10/2019 14:35   Vas [place] Lower Extremity Venous (dvt)  Result Date: 08/06/2019  Lower Venous Study Indications: Edema.  Comparison Study: 07/30/19 previous study Performing Technologist: Abram Sander RVS  Examination Guidelines: A complete evaluation includes B-mode imaging, spectral Doppler, color Doppler, and power Doppler as needed of all accessible portions of each vessel. Bilateral testing is considered an integral part of a complete examination. Limited examinations for reoccurring indications may be performed as noted.  +---------+---------------+---------+-----------+----------+--------------+  RIGHT     Compressibility Phasicity Spontaneity Properties Thrombus Aging  +---------+---------------+---------+-----------+----------+--------------+  CFV       Full            Yes       Yes                                     +---------+---------------+---------+-----------+----------+--------------+  SFJ       Full                                                             +---------+---------------+---------+-----------+----------+--------------+  FV Prox   Full                                                             +---------+---------------+---------+-----------+----------+--------------+  FV Mid    Full                                                             +---------+---------------+---------+-----------+----------+--------------+  FV Distal Full                                                             +---------+---------------+---------+-----------+----------+--------------+  PFV       Full                                                             +---------+---------------+---------+-----------+----------+--------------+  POP       Full            Yes       Yes                                    +---------+---------------+---------+-----------+----------+--------------+  PTV       Full                                                             +---------+---------------+---------+-----------+----------+--------------+   +----+---------------+---------+-----------+----------+--------------+  LEFT Compressibility Phasicity Spontaneity Properties Thrombus Aging  +----+---------------+---------+-----------+----------+--------------+  CFV  Full            Yes       Yes                                    +----+---------------+---------+-----------+----------+--------------+     Summary: Right: There is no evidence of deep vein thrombosis in the lower extremity. No cystic structure found in the popliteal fossa. Left: No evidence of common femoral vein obstruction.  *See table(s) above for measurements and observations. Electronically signed by Curt Jews MD on 08/06/2019 at 1:35:24 PM.    Final    Vas [place] Lower Extremity Venous (dvt) (only [place])  Result Date: 07/31/2019  Lower Venous Study Indications: Edema.   Limitations: Body habitus and poor ultrasound/tissue interface. Comparison Study: 05/12/19 Performing Technologist: Abram Sander RVS  Examination Guidelines: A complete evaluation includes B-mode imaging, spectral Doppler, color Doppler, and power Doppler as needed of all accessible portions of each vessel. Bilateral testing is considered an integral part of a complete examination. Limited examinations for reoccurring indications may be performed as noted.  +-----+---------------+---------+-----------+----------+--------------+  RIGHT Compressibility Phasicity Spontaneity Properties Thrombus Aging  +-----+---------------+---------+-----------+----------+--------------+  CFV   Full            Yes       Yes                                    +-----+---------------+---------+-----------+----------+--------------+   +---------+---------------+---------+-----------+----------+--------------+  LEFT      Compressibility Phasicity Spontaneity Properties Thrombus Aging  +---------+---------------+---------+-----------+----------+--------------+  CFV       Full            Yes  Yes                                    +---------+---------------+---------+-----------+----------+--------------+  SFJ       Full                                                             +---------+---------------+---------+-----------+----------+--------------+  FV Prox   Full                                                             +---------+---------------+---------+-----------+----------+--------------+  FV Mid    Full                                                             +---------+---------------+---------+-----------+----------+--------------+  FV Distal Full                                                             +---------+---------------+---------+-----------+----------+--------------+  PFV       Full                                                              +---------+---------------+---------+-----------+----------+--------------+  POP       Full            Yes       Yes                                    +---------+---------------+---------+-----------+----------+--------------+  PTV                                                        Not visualized  +---------+---------------+---------+-----------+----------+--------------+  PERO                                                       Not visualized  +---------+---------------+---------+-----------+----------+--------------+     Summary: Right: No evidence of common femoral vein obstruction. Left: There is no evidence of deep vein thrombosis in the lower extremity. However, portions of this examination were limited- see technologist comments above.  No cystic structure found in the popliteal fossa.  *See table(s) above for measurements and observations. Electronically signed by Deitra Mayo MD on 07/31/2019 at 1:11:50 PM.    Final       Subjective: No issues overnight  Discharge Exam: Vitals:   08/12/19 0442 08/12/19 0914  BP: 112/70 112/73  Pulse: 80 80  Resp: 14   Temp: 98.5 F (36.9 C) 98.4 F (36.9 C)  SpO2: 100% 99%   Vitals:   08/11/19 1610 08/11/19 2003 08/12/19 0442 08/12/19 0914  BP: 109/72 103/71 112/70 112/73  Pulse: 90 90 80 80  Resp: '17 18 14   '$ Temp: 99 F (37.2 C) 98.7 F (37.1 C) 98.5 F (36.9 C) 98.4 F (36.9 C)  TempSrc: Oral Oral Oral Oral  SpO2: 96% 100% 100% 99%  Weight:      Height:        General: Pt is alert, awake, not in acute distress Cardiovascular: RRR, S1/S2 +, no rubs, no gallops Respiratory: CTA bilaterally, no wheezing, no rhonchi Abdominal: Soft, NT, ND, bowel sounds + Extremities: lymphedema bilaterally with unna boots    The results of significant diagnostics from this hospitalization (including imaging, microbiology, ancillary and laboratory) are listed below for reference.     Microbiology: Recent Results (from the past 240  hour(s))  Culture, blood (Routine x 2)     Status: None   Collection Time: 08/04/19  7:06 PM   Specimen: BLOOD  Result Value Ref Range Status   Specimen Description BLOOD LEFT ANTECUBITAL  Final   Special Requests   Final    BOTTLES DRAWN AEROBIC AND ANAEROBIC Blood Culture results may not be optimal due to an inadequate volume of blood received in culture bottles   Culture   Final    NO GROWTH 5 DAYS Performed at [facility], [place] [address], [place], [place] [address]    Report Status 08/09/2019 FINAL  Final  SARS CORONAVIRUS 2 (TAT 6-24 HRS) Nasopharyngeal Nasopharyngeal Swab     Status: None   Collection Time: 08/05/19  5:00 AM   Specimen: Nasopharyngeal Swab  Result Value Ref Range Status   SARS Coronavirus 2 NEGATIVE NEGATIVE Final    Comment: (NOTE) SARS-CoV-2 target nucleic acids are NOT DETECTED. The SARS-CoV-2 RNA is generally detectable in upper and lower respiratory specimens during the acute phase of infection. Negative results do not preclude SARS-CoV-2 infection, do not rule out co-infections with other pathogens, and should not be used as the sole basis for treatment or other patient management decisions. Negative results must be combined with clinical observations, patient history, and epidemiological information. The expected result is Negative. Fact Sheet for Patients: SugarRoll.be Fact Sheet for Healthcare Providers: https://www.woods-mathews.com/ This test is not yet approved or cleared by the [place] FDA and  has been authorized for detection and/or diagnosis of SARS-CoV-2 by FDA under an Emergency Use Authorization (EUA). This EUA will remain  in effect (meaning this test can be used) for the duration of the COVID-19 declaration under Section 56 4(b)(1) of the Act, 21 U.S.C. section 360bbb-3(b)(1), unless the authorization is terminated or revoked sooner. Performed at [facility], [place]  [address], [place], [place] [address]   Culture, blood (Routine x 2)     Status: None   Collection Time: 08/05/19  6:04 AM   Specimen: BLOOD RIGHT FOREARM  Result Value Ref Range Status   Specimen Description BLOOD RIGHT FOREARM  Final   Special Requests   Final  BOTTLES DRAWN AEROBIC AND ANAEROBIC Blood Culture results may not be optimal due to an inadequate volume of blood received in culture bottles   Culture   Final    NO GROWTH 5 DAYS Performed at [facility], [place] [address], [place], [place] [address]    Report Status 08/10/2019 FINAL  Final  Aerobic/Anaerobic Culture (surgical/deep wound)     Status: None (Preliminary result)   Collection Time: 08/11/19 10:33 AM   Specimen: Abscess  Result Value Ref Range Status   Specimen Description ABSCESS LEFT THIGH  Final   Special Requests Normal  Final   Gram Stain   Final    RARE WBC PRESENT, PREDOMINANTLY PMN RARE GRAM NEGATIVE RODS Performed at [facility], [place] [address] [place], [place] [address]    Culture PENDING  Incomplete   Report Status PENDING  Incomplete     Labs: BNP (last 3 results) No results for input(s): BNP in the last 8760 hours. Basic Metabolic Panel: Recent Labs  Lab 08/06/19 0356 08/07/19 0317 08/09/19 0419 08/10/19 0341  NA 140 141 137 140  K 4.2 4.6 4.0 4.1  CL 105 106 101 104  CO2 '28 26 27 25  '$ GLUCOSE 99 119* 157* 117*  BUN 10 16 28* 23  CREATININE 1.07* 1.07* 1.25* 0.86  CALCIUM 9.4 9.6 10.1 10.2  PHOS  --   --  3.4 3.5   Liver Function Tests: Recent Labs  Lab 08/06/19 0356 08/09/19 0419 08/10/19 0341  AST 11*  --   --   ALT 12  --   --   ALKPHOS 73  --   --   BILITOT 0.6  --   --   PROT 6.3*  --   --   ALBUMIN 2.5* 2.9* 2.9*   No results for input(s): LIPASE, AMYLASE in the last 168 hours. No results for input(s): AMMONIA in the last 168 hours. CBC: Recent Labs  Lab 08/06/19 0356 08/07/19 0317 08/08/19 0337 08/09/19 0419 08/10/19 0341  WBC 8.4 8.7 11.8* 13.3*  10.5  NEUTROABS  --  5.7 8.5* 10.1* 7.3  HGB 8.7* 8.7* 9.7* 10.3* 10.8*  HCT 27.0* 27.4* 30.7* 32.8* 34.1*  MCV 78.5* 78.7* 79.7* 79.2* 79.3*  PLT 339 358 397 428* 387   Cardiac Enzymes: No results for input(s): CKTOTAL, CKMB, CKMBINDEX, TROPONINI in the last 168 hours. BNP: Invalid input(s): POCBNP CBG: Recent Labs  Lab 08/11/19 0642 08/11/19 1229 08/11/19 1642 08/11/19 2139 08/12/19 0657  GLUCAP 115* 121* 90 121* 118*   D-Dimer No results for input(s): DDIMER in the last 72 hours. Hgb A1c No results for input(s): HGBA1C in the last 72 hours. Lipid Profile No results for input(s): CHOL, HDL, LDLCALC, TRIG, CHOLHDL, LDLDIRECT in the last 72 hours. Thyroid function studies No results for input(s): TSH, T4TOTAL, T3FREE, THYROIDAB in the last 72 hours.  Invalid input(s): FREET3 Anemia work up No results for input(s): VITAMINB12, FOLATE, FERRITIN, TIBC, IRON, RETICCTPCT in the last 72 hours. Urinalysis No results found for: COLORURINE, APPEARANCEUR, [place], [place], [place], [place], [place], [place], [place], UROBILINOGEN, NITRITE, LEUKOCYTESUR Sepsis Labs Invalid input(s): PROCALCITONIN,  WBC,  LACTICIDVEN Microbiology Recent Results (from the past 240 hour(s))  Culture, blood (Routine x 2)     Status: None   Collection Time: 08/04/19  7:06 PM   Specimen: BLOOD  Result Value Ref Range Status   Specimen Description BLOOD LEFT ANTECUBITAL  Final   Special Requests   Final    BOTTLES DRAWN AEROBIC AND  ANAEROBIC Blood Culture results may not be optimal due to an inadequate volume of blood received in culture bottles   Culture   Final    NO GROWTH 5 DAYS Performed at [place] [address] [place], [place] [address]    Report Status 08/09/2019 FINAL  Final  SARS CORONAVIRUS 2 (TAT 6-24 HRS) Nasopharyngeal Nasopharyngeal Swab     Status: None   Collection Time: 08/05/19  5:00 AM   Specimen: Nasopharyngeal Swab  Result Value Ref Range Status   SARS  Coronavirus 2 NEGATIVE NEGATIVE Final    Comment: (NOTE) SARS-CoV-2 target nucleic acids are NOT DETECTED. The SARS-CoV-2 RNA is generally detectable in upper and lower respiratory specimens during the acute phase of infection. Negative results do not preclude SARS-CoV-2 infection, do not rule out co-infections with other pathogens, and should not be used as the sole basis for treatment or other patient management decisions. Negative results must be combined with clinical observations, patient history, and epidemiological information. The expected result is Negative. Fact Sheet for Patients: SugarRoll.be Fact Sheet for Healthcare Providers: https://www.woods-mathews.com/ This test is not yet approved or cleared by the [place] FDA and  has been authorized for detection and/or diagnosis of SARS-CoV-2 by FDA under an Emergency Use Authorization (EUA). This EUA will remain  in effect (meaning this test can be used) for the duration of the COVID-19 declaration under Section 56 4(b)(1) of the Act, 21 U.S.C. section 360bbb-3(b)(1), unless the authorization is terminated or revoked sooner. Performed at [facility], [place] [address], [place], [place] [address]   Culture, blood (Routine x 2)     Status: None   Collection Time: 08/05/19  6:04 AM   Specimen: BLOOD RIGHT FOREARM  Result Value Ref Range Status   Specimen Description BLOOD RIGHT FOREARM  Final   Special Requests   Final    BOTTLES DRAWN AEROBIC AND ANAEROBIC Blood Culture results may not be optimal due to an inadequate volume of blood received in culture bottles   Culture   Final    NO GROWTH 5 DAYS Performed at [facility], [place] [address] [place], [place] [address]    Report Status 08/10/2019 FINAL  Final  Aerobic/Anaerobic Culture (surgical/deep wound)     Status: None (Preliminary result)   Collection Time: 08/11/19 10:33 AM   Specimen: Abscess  Result Value Ref  Range Status   Specimen Description ABSCESS LEFT THIGH  Final   Special Requests Normal  Final   Gram Stain   Final    RARE WBC PRESENT, PREDOMINANTLY PMN RARE GRAM NEGATIVE RODS Performed at [facility], [place] [address], [place], [place] [address]    Culture PENDING  Incomplete   Report Status PENDING  Incomplete     Time coordinating discharge: 35 minutes  SIGNED:   Cordelia Poche, MD [facility] 08/12/2019, 9:50 AM

## 2019-08-12 NOTE — Discharge Instructions (Signed)
Jessica Yates,  You were in the hospital with cellulitis. This was successfully treated with antibiotics. You have completed your antibiotic regimen. Please follow-up with your PCP. You can also follow up with a dermatologist for your skin questions. In the mean time, please use the [name] and Eucerin.

## 2019-08-12 NOTE — Plan of Care (Signed)
Pt discharging home. Discharge instructions explained to pt and pt verbalized understanding. Packed all personal belongings and returned to pt. No further questions or concerns voiced. Awaiting transportation.   Problem: Education: Goal: Knowledge of General Education information will improve Description: Including pain rating scale, medication(s)/side effects and non-pharmacologic comfort measures Outcome: Completed/Met   Problem: Health Behavior/Discharge Planning: Goal: Ability to manage health-related needs will improve Outcome: Completed/Met   Problem: Clinical Measurements: Goal: Ability to maintain clinical measurements within normal limits will improve Outcome: Completed/Met Goal: Will remain free from infection 08/12/2019 1453 by Stevan Born, RN Outcome: Completed/Met 08/12/2019 1052 by Stevan Born, RN Outcome: Progressing Goal: Diagnostic test results will improve Outcome: Completed/Met Goal: Respiratory complications will improve Outcome: Completed/Met Goal: Cardiovascular complication will be avoided Outcome: Completed/Met   Problem: Activity: Goal: Risk for activity intolerance will decrease Outcome: Completed/Met   Problem: Nutrition: Goal: Adequate nutrition will be maintained Outcome: Completed/Met   Problem: Coping: Goal: Level of anxiety will decrease 08/12/2019 1453 by Stevan Born, RN Outcome: Completed/Met 08/12/2019 1052 by Stevan Born, RN Outcome: Progressing   Problem: Elimination: Goal: Will not experience complications related to bowel motility Outcome: Completed/Met Goal: Will not experience complications related to urinary retention Outcome: Completed/Met   Problem: Pain Managment: Goal: General experience of comfort will improve 08/12/2019 1453 by Stevan Born, RN Outcome: Completed/Met 08/12/2019 1052 by Stevan Born, RN Outcome: Progressing   Problem: Safety: Goal: Ability to remain free from injury will  improve 08/12/2019 1453 by Stevan Born, RN Outcome: Completed/Met 08/12/2019 1052 by Stevan Born, RN Outcome: Progressing   Problem: Skin Integrity: Goal: Risk for impaired skin integrity will decrease 08/12/2019 1453 by Stevan Born, RN Outcome: Completed/Met 08/12/2019 1052 by Stevan Born, RN Outcome: Progressing

## 2019-08-16 LAB — AEROBIC/ANAEROBIC CULTURE W GRAM STAIN (SURGICAL/DEEP WOUND)
Culture: NO GROWTH
Special Requests: NORMAL

## 2019-09-05 ENCOUNTER — Encounter: Payer: Self-pay | Admitting: Podiatry

## 2019-09-05 ENCOUNTER — Other Ambulatory Visit: Payer: Self-pay

## 2019-09-05 ENCOUNTER — Ambulatory Visit: Payer: Medicare HMO | Admitting: Podiatry

## 2019-09-05 VITALS — BP 105/78 | HR 108

## 2019-09-05 DIAGNOSIS — M79675 Pain in left toe(s): Secondary | ICD-10-CM | POA: Diagnosis not present

## 2019-09-05 DIAGNOSIS — B351 Tinea unguium: Secondary | ICD-10-CM

## 2019-09-05 DIAGNOSIS — I890 Lymphedema, not elsewhere classified: Secondary | ICD-10-CM | POA: Diagnosis not present

## 2019-09-05 DIAGNOSIS — M79674 Pain in right toe(s): Secondary | ICD-10-CM

## 2019-09-10 NOTE — Progress Notes (Signed)
Subjective: Jessica Yates presents today referred by Sonia Side., FNP with cc of painful, discolored, thick toenails which interfere with daily activities.  Pain is aggravated when wearing enclosed shoe gear.   She would like to know what ingrown toenails are and if her skin is moisturized appropriately.    She has h/o lymphedema and cellulitis of the LLE.   Past Medical History:  Diagnosis Date  . Anemia 2002  . Anxiety   . Arthritis   . Asthma   . Blood clot in vein   . Cellulitis   . Cough   . Depression 02/06/2008  . DVT (deep venous thrombosis) ([place]) 2002   left leg  . Glaucoma   . Lymphedema   . Palpitations   . Peripheral venous insufficiency   . Seasonal allergies   . Sickle cell trait ([place])   . Vitamin D deficiency      Patient Active Problem List   Diagnosis Date Noted  . Cellulitis 05/13/2019  . Cellulitis of right lower extremity 05/12/2019  . CKD (chronic kidney disease) stage 3, GFR 30-59 ml/min 05/12/2019  . Normochromic normocytic anemia 05/12/2019  . Cellulitis, leg 05/12/2019  . Impacted cerumen of left ear 02/22/2019  . Laryngopharyngeal reflux (LPR) 02/22/2019  . 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  . Pain in left knee 11/13/2018  . Cellulitis and abscess of left leg 07/22/2018  . Primary open angle glaucoma of both eyes, mild stage 05/08/2018  . Cough 05/07/2016  . Upper airway cough syndrome 05/07/2016  . Sebaceous cyst of labia 04/19/2016  . OAB (overactive bladder) 04/19/2016  . 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 Lymphedema 05/03/2012  . CHF (congestive heart failure) ([place]) 04/16/2011  . SICKLE-CELL TRAIT 08/01/2007  . UNSPECIFIED GLAUCOMA 08/01/2007  . DVT 08/01/2007  . VENOUS INSUFFICIENCY 08/01/2007  . ALLERGIC RHINITIS 08/01/2007  . Extrinsic asthma 08/01/2007     Past Surgical History:  Procedure Laterality Date  . COSMETIC SURGERY  1975   nasal reconstruction  . TOTAL ABDOMINAL  HYSTERECTOMY  07/2001     Current Outpatient Medications on File Prior to Visit  Medication Sig Dispense Refill  . acetaminophen (TYLENOL) 500 MG tablet Take 1,000 mg by mouth every 6 (six) hours as needed for mild pain.     Marland Kitchen aspirin 81 MG tablet Take 81 mg by mouth daily.      Marland Kitchen atorvastatin (LIPITOR) 10 MG tablet     . blood glucose meter kit and supplies KIT Dispense based on patient and insurance preference. Use up to four times daily as directed. (FOR ICD-9 250.00, 250.01). 1 each 0  . Cholecalciferol (VITAMIN D3) 25 MCG (1000 UT) CAPS Take 1 capsule (1,000 Units total) by mouth daily. (Patient taking differently: Take 1,000 Units by mouth 3 (three) times a week. ) 30 capsule 0  . diclofenac Sodium (VOLTAREN) 1 % GEL Apply 2-4 g topically 4 (four) times daily as needed (for pain).    . famotidine (PEPCID) 20 MG tablet Take 20 mg by mouth 2 (two) times daily.    . ferrous sulfate 325 (65 FE) MG tablet Take 325 mg by mouth 3 (three) times a week.     . fluticasone (FLONASE) 50 MCG/ACT nasal spray Place 1-2 sprays into both nostrils daily as needed for allergies or rhinitis.     . furosemide (LASIX) 40 MG tablet '40mg'$  twice daily for 5 days, then '40mg'$  once daily. (Patient taking differently: Take 40 mg by mouth  daily. ) 60 tablet 0  . gabapentin (NEURONTIN) 100 MG capsule Take 1 capsule (100 mg total) by mouth 2 (two) times daily. (Patient not taking: Reported on 08/05/2019) 30 capsule 0  . glipiZIDE (GLUCOTROL) 10 MG tablet Take 10 mg by mouth daily before breakfast.     . hydrocerin (EUCERIN) CREA Apply 1 application topically 2 (two) times daily.  0  . ipratropium (ATROVENT) 0.03 % nasal spray Place 2 sprays into the nose daily as needed for rhinitis.   6  . mirabegron ER (MYRBETRIQ) 25 MG TB24 tablet Take 25 mg by mouth daily as needed (for urinary issues).    Marland Kitchen omeprazole (PRILOSEC) 20 MG capsule omeprazole 20 mg capsule,delayed release    . potassium chloride SA (K-DUR,KLOR-CON) 20 MEQ  tablet Take 20 mEq by mouth daily.     . traMADol (ULTRAM) 50 MG tablet Take 50 mg by mouth daily as needed (for pain).    . TRAVATAN Z 0.004 % SOLN ophthalmic solution Place 1 drop into both eyes at bedtime.     . triamcinolone cream (KENALOG) 0.1 % Apply 1 application topically daily as needed (to affected, irritated sites).     . [DISCONTINUED] metFORMIN (GLUCOPHAGE) 500 MG tablet Take 1 tablet (500 mg total) by mouth 2 (two) times daily with a meal. (Patient not taking: Reported on 06/14/2019) 60 tablet 0   No current facility-administered medications on file prior to visit.     No Active Allergies   Social History   Occupational History  . Occupation: retired from [employer]  Tobacco Use  . Smoking status: Never Smoker  . Smokeless tobacco: Never Used  Substance and Sexual Activity  . Alcohol use: No  . Drug use: No  . Sexual activity: Never     Family History  Problem Relation Age of Onset  . Allergies Mother   . Asthma Mother   . Other Mother        enlarged heart  . Arthritis Mother   . Breast cancer Mother   . Diabetes Mother   . Hypertension Mother   . Cancer Mother        breast  . Hyperlipidemia Mother   . Heart disease Maternal Grandmother   . Breast cancer Maternal Grandmother   . Birth defects Maternal Grandmother        breast  . Cancer Father 87       leukemia  . Allergies Sister   . Arthritis Sister      Immunization History  Administered Date(s) Administered  . Influenza Split 07/15/2015  . Influenza Whole 10/14/2010     Review of systems: Positive Findings in bold print.  Constitutional:  chills, fatigue, fever, sweats, weight change Communication: Optometrist, sign Ecologist, hand writing, iPad/Android device Head: headaches, head injury Eyes: changes in vision, eye pain, glaucoma, cataracts, macular degeneration, diplopia, glare,  light sensitivity, eyeglasses or contacts, blindness Ears nose mouth throat:  hearing impaired, hearing aids,  ringing in ears, deaf, sign language,  vertigo,   nosebleeds,  rhinitis,  cold sores, snoring, swollen glands Cardiovascular: HTN, edema, arrhythmia, pacemaker in place, defibrillator in place, chest pain/tightness, chronic anticoagulation, blood clot, heart failure, MI Peripheral Vascular: leg cramps, varicose veins, blood clots, lymphedema, varicosities Respiratory:  difficulty breathing, denies congestion, SOB, wheezing, cough, emphysema Gastrointestinal: change in appetite or weight, abdominal pain, constipation, diarrhea, nausea, vomiting, vomiting blood, change in bowel habits, abdominal pain, jaundice, rectal bleeding, hemorrhoids, GERD Genitourinary:  nocturia,  pain on  urination, polyuria,  blood in urine, Foley catheter, urinary urgency, ESRD on hemodialysis Musculoskeletal: amputation, cramping, stiff joints, painful joints, decreased joint motion, fractures, OA, gout, hemiplegia, paraplegia, uses cane, wheelchair bound, uses walker, uses rollator Skin: +changes in toenails, color change, dryness, itching, mole changes,  rash, wound(s) Neurological: headaches, numbness in feet, paresthesias in feet, burning in feet, fainting,  seizures, change in speech. denies headaches, memory problems/poor historian, cerebral palsy, weakness, paralysis, CVA, TIA Endocrine: diabetes, hypothyroidism, hyperthyroidism,  goiter, dry mouth, flushing, heat intolerance,  cold intolerance,  excessive thirst, denies polyuria,  nocturia Hematological:  easy bleeding, excessive bleeding, easy bruising, enlarged lymph nodes, on long term blood thinner, history of past transusions Allergy/immunological:  hives, eczema, frequent infections, multiple drug allergies, seasonal allergies, transplant recipient, multiple food allergies Psychiatric:  anxiety, depression, mood disorder, suicidal ideations, hallucinations, insomnia  Objective: Vitals:   09/05/19 1546  BP: 105/78  Pulse: (!)  108    Vascular Examination: Capillary refill time immediate x 10 digits.  Dorsalis pedis and posterior tibial pulses faintly palpable.   No digital hair x 10 digits.  Skin temperature gradient WNL b/l.  Chronic lymphedema b/l LE.  Dermatological Examination: Skin texture consistent with chronic lymphedema dorsum of both feet.  Toenails 1-5 b/l discolored, thick, dystrophic with subungual debris and pain with palpation to nailbeds due to thickness of nails. Incurvated nailplate b/l great toes with tenderness to palpation. No erythema, no edema, no drainage noted.  Musculoskeletal: Muscle strength 5/5 to all LE muscle groups.  Sausage digits noted 1-5 b/l due to chronic lymphedema b/l LE.  Neurological: Sensation intact b/l with 10 gram monofilament.  Vibratory sensation intact b/l  Assessment: 1. Painful onychomycosis toenails 1-5 b/l  2. Lymphedema b/l LE  Plan: 1. Discussed onychomycosis and treatment options.  Literature dispensed on today. 2. Toenails 1-5 b/l were debrided in length and girth without iatrogenic bleeding. Offending nail borders debrided and curretaged b/l great toes. Borders cleansed with alcohol.  3. Patient to continue soft, supportive shoe gear daily. 4. Patient to report any pedal injuries to medical professional immediately. 5. Follow up 3 months.  6. Patient/POA to call should there be a concern in the interim.

## 2019-09-20 ENCOUNTER — Ambulatory Visit: Payer: Medicare HMO | Admitting: Internal Medicine

## 2019-09-25 ENCOUNTER — Ambulatory Visit: Payer: Medicare HMO | Admitting: Internal Medicine

## 2019-09-26 ENCOUNTER — Ambulatory Visit (INDEPENDENT_AMBULATORY_CARE_PROVIDER_SITE_OTHER): Payer: Medicare PPO | Admitting: Internal Medicine

## 2019-09-26 ENCOUNTER — Encounter: Payer: Self-pay | Admitting: Internal Medicine

## 2019-09-26 ENCOUNTER — Other Ambulatory Visit: Payer: Self-pay

## 2019-09-26 VITALS — BP 116/75 | HR 96 | Wt 225.0 lb

## 2019-09-26 DIAGNOSIS — I890 Lymphedema, not elsewhere classified: Secondary | ICD-10-CM

## 2019-09-26 DIAGNOSIS — I872 Venous insufficiency (chronic) (peripheral): Secondary | ICD-10-CM | POA: Diagnosis not present

## 2019-09-26 NOTE — Patient Instructions (Signed)
   Would use eucerin with adding vaseline to help with moisture  Might be worth tigher compression stockings

## 2019-09-26 NOTE — Progress Notes (Signed)
RFV: initial visit for recurrent cellulits of left lower leg. Hx of being hospitalized in early dec  Patient ID: Jessica Yates, female   DOB: Mar 25, 1951, 69 y.o.   MRN: [id number]  HPI Roy is a 69yo F with obesity, chronic lymphadema, stasis dermatitis with occasional flares of cellulitis. She is referred to [place] by Dustin Folks, NP from [facility].  Aug/sept in right leg, then nov/dec on left leg. At that time she was  empirically started on Vancomycin and Ceftriaxone. She was then transitioned to Doxycycline and Keflex and treated for a total of 10 days. She also has long standing stasis dermatitis which complicates physical exam.  Left leg started to get worse yesterday with some blistering, not tender per her report  Takes lasix to help with fluid status  Bilateral OA of knees  I have reviewed her outside records and pictures   Outpatient Encounter Medications as of 09/26/2019  Medication Sig  . acetaminophen (TYLENOL) 500 MG tablet Take 1,000 mg by mouth every 6 (six) hours as needed for mild pain.   Marland Kitchen aspirin 81 MG tablet Take 81 mg by mouth daily.    Marland Kitchen atorvastatin (LIPITOR) 10 MG tablet   . blood glucose meter kit and supplies KIT Dispense based on patient and insurance preference. Use up to four times daily as directed. (FOR ICD-9 250.00, 250.01).  Marland Kitchen diclofenac Sodium (VOLTAREN) 1 % GEL Apply 2-4 g topically 4 (four) times daily as needed (for pain).  . famotidine (PEPCID) 20 MG tablet Take 20 mg by mouth 2 (two) times daily.  . ferrous sulfate 325 (65 FE) MG tablet Take 325 mg by mouth 3 (three) times a week.   . fluticasone (FLONASE) 50 MCG/ACT nasal spray Place 1-2 sprays into both nostrils daily as needed for allergies or rhinitis.   . furosemide (LASIX) 40 MG tablet 63m twice daily for 5 days, then 467monce daily. (Patient taking differently: Take 40 mg by mouth daily. )  . glipiZIDE (GLUCOTROL) 10 MG tablet Take 10 mg by mouth daily before breakfast.   .  hydrocerin (EUCERIN) CREA Apply 1 application topically 2 (two) times daily.  . mirabegron ER (MYRBETRIQ) 25 MG TB24 tablet Take 25 mg by mouth daily as needed (for urinary issues).  . Marland Kitchenmeprazole (PRILOSEC) 20 MG capsule omeprazole 20 mg capsule,delayed release  . potassium chloride SA (K-DUR,KLOR-CON) 20 MEQ tablet Take 20 mEq by mouth daily.   . traMADol (ULTRAM) 50 MG tablet Take 50 mg by mouth daily as needed (for pain).  . TRAVATAN Z 0.004 % SOLN ophthalmic solution Place 1 drop into both eyes at bedtime.   . triamcinolone cream (KENALOG) 0.1 % Apply 1 application topically daily as needed (to affected, irritated sites).   . Cholecalciferol (VITAMIN D3) 25 MCG (1000 UT) CAPS Take 1 capsule (1,000 Units total) by mouth daily. (Patient not taking: Reported on 09/26/2019)  . gabapentin (NEURONTIN) 100 MG capsule Take 1 capsule (100 mg total) by mouth 2 (two) times daily. (Patient not taking: Reported on 08/05/2019)  . ipratropium (ATROVENT) 0.03 % nasal spray Place 2 sprays into the nose daily as needed for rhinitis.   . [DISCONTINUED] metFORMIN (GLUCOPHAGE) 500 MG tablet Take 1 tablet (500 mg total) by mouth 2 (two) times daily with a meal. (Patient not taking: Reported on 06/14/2019)   No facility-administered encounter medications on file as of 09/26/2019.     Patient Active Problem List   Diagnosis Date Noted  . Cellulitis 05/13/2019  .  Cellulitis of right lower extremity 05/12/2019  . CKD (chronic kidney disease) stage 3, GFR 30-59 ml/min 05/12/2019  . Normochromic normocytic anemia 05/12/2019  . Cellulitis, leg 05/12/2019  . Impacted cerumen of left ear 02/22/2019  . Laryngopharyngeal reflux (LPR) 02/22/2019  . 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  . Pain in left knee 11/13/2018  . Cellulitis and abscess of left leg 07/22/2018  . Primary open angle glaucoma of both eyes, mild stage 05/08/2018  . Cough 05/07/2016  . Upper airway cough syndrome 05/07/2016  . Sebaceous cyst of labia  04/19/2016  . OAB (overactive bladder) 04/19/2016  . 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 Lymphedema 05/03/2012  . CHF (congestive heart failure) ([place]) 04/16/2011  . SICKLE-CELL TRAIT 08/01/2007  . UNSPECIFIED GLAUCOMA 08/01/2007  . DVT 08/01/2007  . VENOUS INSUFFICIENCY 08/01/2007  . ALLERGIC RHINITIS 08/01/2007  . Extrinsic asthma 08/01/2007     Health Maintenance Due  Topic Date Due  . Hepatitis C Screening  1951/04/01  . TETANUS/TDAP  10/09/1969  . MAMMOGRAM  10/09/2000  . DEXA SCAN  10/10/2015  . PNA vac Low Risk Adult (1 of 2 - PCV13) 10/10/2015  . INFLUENZA VACCINE  04/14/2019    Social History   Tobacco Use  . Smoking status: Never Smoker  . Smokeless tobacco: Never Used  Substance Use Topics  . Alcohol use: No  . Drug use: No  family history includes Allergies in her mother and sister; Arthritis in her mother and sister; Asthma in her mother; Birth defects in her maternal grandmother; Breast cancer in her maternal grandmother and mother; Cancer in her mother; Cancer (age of onset: 88) in her father; Diabetes in her mother; Heart disease in her maternal grandmother; Hyperlipidemia in her mother; Hypertension in her mother; Other in her mother. Review of Systems Review of Systems  Constitutional: Negative for fever, chills, diaphoresis, activity change, appetite change, fatigue and unexpected weight change.  HENT: Negative for congestion, sore throat, rhinorrhea, sneezing, trouble swallowing and sinus pressure.  Eyes: Negative for photophobia and visual disturbance.  Respiratory: Negative for cough, chest tightness, shortness of breath, wheezing and stridor.  Cardiovascular: Negative for chest pain, palpitations and leg swelling.  Gastrointestinal: Negative for nausea, vomiting, abdominal pain, diarrhea, constipation, blood in stool, abdominal distention and anal bleeding.  Genitourinary: Negative for dysuria, hematuria, flank pain and  difficulty urinating.  Musculoskeletal: Negative for myalgias, back pain, joint swelling, arthralgias and gait problem.  Skin: +rash Neurological: Negative for dizziness, tremors, weakness and light-headedness.  Hematological: Negative for adenopathy. Does not bruise/bleed easily.  Psychiatric/Behavioral: Negative for behavioral problems, confusion, sleep disturbance, dysphoric mood, decreased concentration and agitation.    Physical Exam   BP 116/75   Pulse 96   Wt 225 lb (102.1 kg)   BMI 33.23 kg/m   Physical Exam  Constitutional:  oriented to person, place, and time. appears well-developed and well-nourished. No distress.  HENT: [place]/AT, PERRLA, no scleral icterus Mouth/Throat: Oropharynx is clear and moist. No oropharyngeal exudate.  Cardiovascular: Normal rate, regular rhythm and normal heart sounds. Exam reveals no gallop and no friction rub.  No murmur heard.  Pulmonary/Chest: Effort normal and breath sounds normal. No respiratory distress.  has no wheezes.  Neck = supple, no nuchal rigidity Abdominal: Soft. Bowel sounds are normal.  exhibits no distension. There is no tenderness.  Lymphadenopathy: no cervical adenopathy. No axillary adenopathy Neurological: alert and oriented to person, place, and time.  Skin: Skin is warm and dry. Has some stasis changes  to both legs. No appears of excess warmth, open wounds. No induration to area that she is concerned about Psychiatric: a normal mood and affect.  behavior is normal.   CBC Lab Results  Component Value Date   WBC 10.5 08/10/2019   RBC 4.30 08/10/2019   HGB 10.8 (L) 08/10/2019   HCT 34.1 (L) 08/10/2019   PLT 387 08/10/2019   MCV 79.3 (L) 08/10/2019   MCH 25.1 (L) 08/10/2019   MCHC 31.7 08/10/2019   RDW 18.0 (H) 08/10/2019   LYMPHSABS 2.2 08/10/2019   MONOABS 0.7 08/10/2019   EOSABS 0.2 08/10/2019    BMET Lab Results  Component Value Date   NA 140 08/10/2019   K 4.1 08/10/2019   CL 104 08/10/2019   CO2 25  08/10/2019   GLUCOSE 117 (H) 08/10/2019   BUN 23 08/10/2019   CREATININE 0.86 08/10/2019   CALCIUM 10.2 08/10/2019   GFRNONAA >60 08/10/2019   GFRAA >60 08/10/2019     Assessment and Plan Chronic lymphadema = continue with compression socks.may need increased pressure No need for antibiotics at this time. Appears to be consistent with  stasis dermatitis   Spent 45 min with patient discussing the need for management of lymphadema may help minimize risk of recurrent cellulitis

## 2019-10-04 ENCOUNTER — Ambulatory Visit: Payer: Medicare PPO | Attending: Internal Medicine

## 2019-10-04 DIAGNOSIS — Z23 Encounter for immunization: Secondary | ICD-10-CM

## 2019-10-04 NOTE — Progress Notes (Signed)
   Covid-19 Vaccination Clinic  Name:  Jessica Yates    MRN: [id number] DOB: 24-Sep-1950  10/04/2019  [name] was observed post Covid-19 immunization for 15 minutes without incidence. She was provided with Vaccine Information Sheet and instruction to access the V-Safe system.   [name] was instructed to call 911 with any severe reactions post vaccine: Marland Kitchen Difficulty breathing  . Swelling of your face and throat  . A fast heartbeat  . A bad rash all over your body  . Dizziness and weakness    Immunizations Administered    Name Date Dose VIS Date Route   Pfizer COVID-19 Vaccine 10/04/2019  3:55 PM 0.3 mL 08/24/2019 Intramuscular   Manufacturer: [place]   Lot: [id number]   [place]: [id number]

## 2019-10-11 ENCOUNTER — Telehealth: Payer: Self-pay

## 2019-10-11 NOTE — Telephone Encounter (Signed)
Patient called office today with some questions and request. Patient would like to know if MD would be able to request lymphedema pump. States that PCP is unable to do this. Advised patient to contact either vascular team or podiatrist for this. Patient would also like speak with MD regarding questions about cellulitis.  Will forward message to MD to see if she can speak with patient. [facility]

## 2019-10-15 ENCOUNTER — Ambulatory Visit: Payer: Medicare PPO | Attending: Internal Medicine

## 2019-10-15 DIAGNOSIS — Z20822 Contact with and (suspected) exposure to covid-19: Secondary | ICD-10-CM

## 2019-10-16 LAB — NOVEL CORONAVIRUS, NAA: SARS-CoV-2, NAA: NOT DETECTED

## 2019-10-25 ENCOUNTER — Ambulatory Visit: Payer: Medicare PPO | Attending: Internal Medicine

## 2019-10-25 DIAGNOSIS — Z23 Encounter for immunization: Secondary | ICD-10-CM | POA: Insufficient documentation

## 2019-10-25 NOTE — Progress Notes (Signed)
   Covid-19 Vaccination Clinic  Name:  Jessica Yates    MRN: [id number] DOB: 1950-11-12  10/25/2019  Jessica Yates was observed post Covid-19 immunization for 15 minutes without incidence. She was provided with Vaccine Information Sheet and instruction to access the V-Safe system.   Jessica Yates was instructed to call 911 with any severe reactions post vaccine: Marland Kitchen Difficulty breathing  . Swelling of your face and throat  . A fast heartbeat  . A bad rash all over your body  . Dizziness and weakness    Immunizations Administered    Name Date Dose VIS Date Route   Pfizer COVID-19 Vaccine 10/25/2019  4:50 PM 0.3 mL 08/24/2019 Intramuscular   Manufacturer: [place]   Lot: [id number]   [place]: [id number]

## 2019-12-04 ENCOUNTER — Ambulatory Visit: Payer: Medicare PPO | Admitting: Podiatry

## 2019-12-04 ENCOUNTER — Encounter: Payer: Self-pay | Admitting: Podiatry

## 2019-12-04 ENCOUNTER — Other Ambulatory Visit: Payer: Self-pay

## 2019-12-04 ENCOUNTER — Ambulatory Visit: Payer: Medicare HMO | Admitting: Podiatry

## 2019-12-04 DIAGNOSIS — M79674 Pain in right toe(s): Secondary | ICD-10-CM

## 2019-12-04 DIAGNOSIS — B353 Tinea pedis: Secondary | ICD-10-CM | POA: Diagnosis not present

## 2019-12-04 DIAGNOSIS — N183 Chronic kidney disease, stage 3 unspecified: Secondary | ICD-10-CM

## 2019-12-04 DIAGNOSIS — M79675 Pain in left toe(s): Secondary | ICD-10-CM

## 2019-12-04 DIAGNOSIS — B351 Tinea unguium: Secondary | ICD-10-CM | POA: Diagnosis not present

## 2019-12-04 DIAGNOSIS — I890 Lymphedema, not elsewhere classified: Secondary | ICD-10-CM

## 2019-12-04 MED ORDER — CLOTRIMAZOLE 1 % EX CREA: TOPICAL_CREAM | CUTANEOUS | 1 refills | Status: DC

## 2019-12-04 NOTE — Patient Instructions (Addendum)
Athlete's Foot  Athlete's foot (tinea pedis) is a fungal infection of the skin on your feet. It often occurs on the skin that is between or underneath the toes. It can also occur on the soles of your feet. The infection can spread from person to person (is contagious). It can also spread when a person's bare feet come in contact with the fungus on shower floors or on items such as shoes. What are the causes? This condition is caused by a fungus that grows in warm, moist places. You can get athlete's foot by sharing shoes, shower stalls, towels, and wet floors with someone who is infected. Not washing your feet or changing your socks often enough can also lead to athlete's foot. What increases the risk? This condition is more likely to develop in:  Men.  People who have a weak body defense system (immune system).  People who have diabetes.  People who use public showers, such as at a gym.  People who wear heavy-duty shoes, such as industrial or military shoes.  Seasons with warm, humid weather. What are the signs or symptoms? Symptoms of this condition include:  Itchy areas between your toes or on the soles of your feet.  White, flaky, or scaly areas between your toes or on the soles of your feet.  Very itchy small blisters between your toes or on the soles of your feet.  Small cuts in your skin. These cuts can become infected.  Thick or discolored toenails. How is this diagnosed? This condition may be diagnosed with a physical exam and a review of your medical history. Your health care provider may also take a skin or toenail sample to examine under a microscope. How is this treated? This condition is treated with antifungal medicines. These may be applied as powders, ointments, or creams. In severe cases, an oral antifungal medicine may be given. Follow these instructions at home: Medicines  Apply or take over-the-counter and prescription medicines only as told by your health  care provider.  Apply your antifungal medicine as told by your health care provider. Do not stop using the antifungal even if your condition improves. Foot care  Do not scratch your feet.  Keep your feet dry: ? Wear cotton or wool socks. Change your socks every day or if they become wet. ? Wear shoes that allow air to flow, such as sandals or canvas tennis shoes.  Wash and dry your feet, including the area between your toes. Also, wash and dry your feet: ? Every day or as told by your health care provider. ? After exercising. General instructions  Do not let others use towels, shoes, nail clippers, or other personal items that touch your feet.  Protect your feet by wearing sandals in wet areas, such as locker rooms and shared showers.  Keep all follow-up visits as told by your health care provider. This is important.  If you have diabetes, keep your blood sugar under control. Contact a health care provider if:  You have a fever.  You have swelling, soreness, warmth, or redness in your foot.  Your feet are not getting better with treatment.  Your symptoms get worse.  You have new symptoms. Summary  Athlete's foot (tinea pedis) is a fungal infection of the skin on your feet. It often occurs on skin that is between or underneath the toes.  This condition is caused by a fungus that grows in warm, moist places.  Symptoms include white, flaky, or scaly areas between   your toes or on the soles of your feet.  This condition is treated with antifungal medicines.  Keep your feet clean. Always dry them thoroughly. This information is not intended to replace advice given to you by your health care provider. Make sure you discuss any questions you have with your health care provider. Document Revised: 08/25/2017 Document Reviewed: 06/20/2017 Elsevier Patient Education  [place]. Edema  Edema is an abnormal buildup of fluids in the body tissues and under the skin.  Swelling of the legs, feet, and ankles is a common symptom that becomes more likely as you get older. Swelling is also common in looser tissues, like around the eyes. When the affected area is squeezed, the fluid may move out of that spot and leave a dent for a few moments. This dent is called pitting edema. There are many possible causes of edema. Eating too much salt (sodium) and being on your feet or sitting for a long time can cause edema in your legs, feet, and ankles. Hot weather may make edema worse. Common causes of edema include:  Heart failure.  Liver or kidney disease.  Weak leg blood vessels.  Cancer.  An injury.  Pregnancy.  Medicines.  Being obese.  Low protein levels in the blood. Edema is usually painless. Your skin may look swollen or shiny. Follow these instructions at home:  Keep the affected body part raised (elevated) above the level of your heart when you are sitting or lying down.  Do not sit still or stand for long periods of time.  Do not wear tight clothing. Do not wear garters on your upper legs.  Exercise your legs to get your circulation going. This helps to move the fluid back into your blood vessels, and it may help the swelling go down.  Wear elastic bandages or support stockings to reduce swelling as told by your health care provider.  Eat a low-salt (low-sodium) diet to reduce fluid as told by your health care provider.  Depending on the cause of your swelling, you may need to limit how much fluid you drink (fluid restriction).  Take over-the-counter and prescription medicines only as told by your health care provider. Contact a health care provider if:  Your edema does not get better with treatment.  You have heart, liver, or kidney disease and have symptoms of edema.  You have sudden and unexplained weight gain. Get help right away if:  You develop shortness of breath or chest pain.  You cannot breathe when you lie down.  You  develop pain, redness, or warmth in the swollen areas.  You have heart, liver, or kidney disease and suddenly get edema.  You have a fever and your symptoms suddenly get worse. Summary  Edema is an abnormal buildup of fluids in the body tissues and under the skin.  Eating too much salt (sodium) and being on your feet or sitting for a long time can cause edema in your legs, feet, and ankles.  Keep the affected body part raised (elevated) above the level of your heart when you are sitting or lying down. This information is not intended to replace advice given to you by your health care provider. Make sure you discuss any questions you have with your health care provider. Document Revised: 01/17/2019 Document Reviewed: 10/02/2016 Elsevier Patient Education  [place].  Onychomycosis/Fungal Toenails  WHAT IS IT? An infection that lies within the keratin of your nail plate that is caused by a fungus.  WHY ME? Fungal infections affect all ages, sexes, races, and creeds.  There may be many factors that predispose you to a fungal infection such as age, coexisting medical conditions such as diabetes, or an autoimmune disease; stress, medications, fatigue, genetics, etc.  Bottom line: fungus thrives in a warm, moist environment and your shoes offer such a location.  IS IT CONTAGIOUS? Theoretically, yes.  You do not want to share shoes, nail clippers or files with someone who has fungal toenails.  Walking around barefoot in the same room or sleeping in the same bed is unlikely to transfer the organism.  It is important to realize, however, that fungus can spread easily from one nail to the next on the same foot.  HOW DO WE TREAT THIS?  There are several ways to treat this condition.  Treatment may depend on many factors such as age, medications, pregnancy, liver and kidney conditions, etc.  It is best to ask your doctor which options are available to you.  16. No treatment.   Unlike many  other medical concerns, you can live with this condition.  However for many people this can be a painful condition and may lead to ingrown toenails or a bacterial infection.  It is recommended that you keep the nails cut short to help reduce the amount of fungal nail. 17. Topical treatment.  These range from herbal remedies to prescription strength nail lacquers.  About 40-50% effective, topicals require twice daily application for approximately 9 to 12 months or until an entirely new nail has grown out.  The most effective topicals are medical grade medications available through physicians offices. 18. Oral antifungal medications.  With an 80-90% cure rate, the most common oral medication requires 3 to 4 months of therapy and stays in your system for a year as the new nail grows out.  Oral antifungal medications do require blood work to make sure it is a safe drug for you.  A liver function panel will be performed prior to starting the medication and after the first month of treatment.  It is important to have the blood work performed to avoid any harmful side effects.  In general, this medication safe but blood work is required. 19. Laser Therapy.  This treatment is performed by applying a specialized laser to the affected nail plate.  This therapy is noninvasive, fast, and non-painful.  It is not covered by insurance and is therefore, out of pocket.  The results have been very good with a 80-95% cure rate.  The [facility] is the only practice in the area to offer this therapy. 20. Permanent Nail Avulsion.  Removing the entire nail so that a new nail will not grow back.

## 2019-12-09 NOTE — Progress Notes (Signed)
Subjective: Jessica Yates presents today for follow up of at risk foot care. Patient has h/o CKD stage 3 and painful mycotic nails b/l that are difficult to trim. Pain interferes with ambulation. Aggravating factors include wearing enclosed shoe gear. Pain is relieved with periodic professional debridement.   No Active Allergies   Objective: There were no vitals filed for this visit.  Pt is a 69 y.o. AAF, obese in NAD. AAO x 3.  Vascular Examination:  Capillary refill time to digits immediate b/l. Faintly palpable pedal pulses b/l. Pedal hair absent b/l Skin temperature gradient within normal limits b/l. Lymphedema present b/l LE.  Dermatological Examination: No open wounds bilaterally. No interdigital macerations bilaterally. Toenails 1-5 b/l elongated, dystrophic, thickened, crumbly with subungual debris and tenderness to dorsal palpation. Skin appearance consistent with chronic lymphedema b/l LE. Diffuse scaling noted peripherally and plantarly b/l feet with mild foot odor.  No interdigital macerations.  No blisters, no weeping. No signs of secondary bacterial infection noted.  Musculoskeletal: Normal muscle strength 5/5 to all lower extremity muscle groups bilaterally, no pain crepitus or joint limitation noted with ROM b/l and Sausage digitis 1-5 b/l consistent with chronic lymphedema.  Neurological: Protective sensation intact 5/5 intact bilaterally with 10g monofilament b/l Vibratory sensation intact b/l.  Assessment: 1. Pain due to onychomycosis of toenails of both feet   2. Tinea pedis of both feet   3. Lymphedema   4. Stage 3 chronic kidney disease, unspecified whether stage 3a or 3b CKD    Plan: -Toenails 1-5 b/l were debrided in length and girth with sterile nail nippers and dremel without iatrogenic bleeding.  -Patient to continue soft, supportive shoe gear daily. -Patient to report any pedal injuries to medical professional immediately. --Rx sent to pharmacy for  Clotrimazole Cream 1% to be applied to feet twice daily for 4 weeks. -Patient/POA to call should there be question/concern in the interim.  Return in about 3 months (around 03/05/2020) for nail trim.

## 2020-01-14 ENCOUNTER — Other Ambulatory Visit: Payer: Self-pay | Admitting: Family

## 2020-01-14 DIAGNOSIS — Z13820 Encounter for screening for osteoporosis: Secondary | ICD-10-CM

## 2020-01-14 DIAGNOSIS — Z1231 Encounter for screening mammogram for malignant neoplasm of breast: Secondary | ICD-10-CM

## 2020-03-07 ENCOUNTER — Ambulatory Visit: Payer: Medicare PPO | Admitting: Podiatry

## 2020-03-10 ENCOUNTER — Ambulatory Visit: Payer: Medicare PPO | Admitting: Podiatry

## 2020-03-10 ENCOUNTER — Other Ambulatory Visit: Payer: Self-pay

## 2020-03-10 DIAGNOSIS — M79675 Pain in left toe(s): Secondary | ICD-10-CM | POA: Diagnosis not present

## 2020-03-10 DIAGNOSIS — B351 Tinea unguium: Secondary | ICD-10-CM

## 2020-03-10 DIAGNOSIS — M79674 Pain in right toe(s): Secondary | ICD-10-CM | POA: Diagnosis not present

## 2020-03-15 ENCOUNTER — Encounter: Payer: Self-pay | Admitting: Podiatry

## 2020-03-15 NOTE — Progress Notes (Signed)
Subjective:  Patient ID: Jessica Yates, female    DOB: 29-Jun-1951,  MRN: [id number]  69 y.o. female presents with painful thick toenails that are difficult to trim. Pain interferes with ambulation. Aggravating factors include wearing enclosed shoe gear. Pain is relieved with periodic professional debridement.  Review of Systems: Negative except as noted in the HPI. Denies N/V/F/Ch.  Past Medical History:  Diagnosis Date  . Anemia 2002  . Anxiety   . Arthritis   . Asthma   . Blood clot in vein   . Cellulitis   . Cough   . Depression 02/06/2008  . DVT (deep venous thrombosis) ([place]) 2002   left leg  . Glaucoma   . Lymphedema   . Palpitations   . Peripheral venous insufficiency   . Seasonal allergies   . Sickle cell trait ([place])   . Vitamin D deficiency    Past Surgical History:  Procedure Laterality Date  . COSMETIC SURGERY  1975   nasal reconstruction  . TOTAL ABDOMINAL HYSTERECTOMY  07/2001   Patient Active Problem List   Diagnosis Date Noted  . Cellulitis 05/13/2019  . Cellulitis of right lower extremity 05/12/2019  . CKD (chronic kidney disease) stage 3, GFR 30-59 ml/min 05/12/2019  . Normochromic normocytic anemia 05/12/2019  . Cellulitis, leg 05/12/2019  . Impacted cerumen of left ear 02/22/2019  . Laryngopharyngeal reflux (LPR) 02/22/2019  . 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  . Pain in left knee 11/13/2018  . Cellulitis and abscess of left leg 07/22/2018  . Primary open angle glaucoma of both eyes, mild stage 05/08/2018  . Cough 05/07/2016  . Upper airway cough syndrome 05/07/2016  . Sebaceous cyst of labia 04/19/2016  . OAB (overactive bladder) 04/19/2016  . 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 Lymphedema 05/03/2012  . CHF (congestive heart failure) ([place]) 04/16/2011  . SICKLE-CELL TRAIT 08/01/2007  . UNSPECIFIED GLAUCOMA 08/01/2007  . DVT 08/01/2007  . VENOUS INSUFFICIENCY 08/01/2007  . ALLERGIC RHINITIS 08/01/2007  .  Extrinsic asthma 08/01/2007    Current Outpatient Medications:  .  acetaminophen (TYLENOL) 500 MG tablet, Take 1,000 mg by mouth every 6 (six) hours as needed for mild pain. , Disp: , Rfl:  .  aspirin 81 MG tablet, Take 81 mg by mouth daily.  , Disp: , Rfl:  .  atorvastatin (LIPITOR) 10 MG tablet, , Disp: , Rfl:  .  blood glucose meter kit and supplies KIT, Dispense based on patient and insurance preference. Use up to four times daily as directed. (FOR ICD-9 250.00, 250.01)., Disp: 1 each, Rfl: 0 .  Cholecalciferol (VITAMIN D3) 25 MCG (1000 UT) CAPS, Take 1 capsule (1,000 Units total) by mouth daily. (Patient not taking: Reported on 09/26/2019), Disp: 30 capsule, Rfl: 0 .  clotrimazole (LOTRIMIN) 1 % cream, Apply to both feet and between toes twice daily for 4 weeks., Disp: 45 g, Rfl: 1 .  diclofenac Sodium (VOLTAREN) 1 % GEL, Apply 2-4 g topically 4 (four) times daily as needed (for pain)., Disp: , Rfl:  .  famotidine (PEPCID) 20 MG tablet, Take 20 mg by mouth 2 (two) times daily., Disp: , Rfl:  .  ferrous sulfate 325 (65 FE) MG tablet, Take 325 mg by mouth 3 (three) times a week. , Disp: , Rfl:  .  fluticasone (FLONASE) 50 MCG/ACT nasal spray, Place 1-2 sprays into both nostrils daily as needed for allergies or rhinitis. , Disp: , Rfl:  .  furosemide (LASIX) 40 MG tablet, 63m twice  daily for 5 days, then 28m once daily. (Patient taking differently: Take 40 mg by mouth daily. ), Disp: 60 tablet, Rfl: 0 .  gabapentin (NEURONTIN) 100 MG capsule, Take 1 capsule (100 mg total) by mouth 2 (two) times daily. (Patient not taking: Reported on 08/05/2019), Disp: 30 capsule, Rfl: 0 .  glipiZIDE (GLUCOTROL) 10 MG tablet, Take 10 mg by mouth daily before breakfast. , Disp: , Rfl:  .  glipizide-metformin (METAGLIP) 2.5-250 MG tablet, Take by mouth., Disp: , Rfl:  .  hydrocerin (EUCERIN) CREA, Apply 1 application topically 2 (two) times daily., Disp: , Rfl: 0 .  ipratropium (ATROVENT) 0.03 % nasal spray,  Place 2 sprays into the nose daily as needed for rhinitis. , Disp: , Rfl: 6 .  mirabegron ER (MYRBETRIQ) 25 MG TB24 tablet, Take 25 mg by mouth daily as needed (for urinary issues)., Disp: , Rfl:  .  omeprazole (PRILOSEC) 20 MG capsule, omeprazole 20 mg capsule,delayed release, Disp: , Rfl:  .  potassium chloride SA (K-DUR,KLOR-CON) 20 MEQ tablet, Take 20 mEq by mouth daily. , Disp: , Rfl:  .  traMADol (ULTRAM) 50 MG tablet, Take 50 mg by mouth daily as needed (for pain)., Disp: , Rfl:  .  TRAVATAN Z 0.004 % SOLN ophthalmic solution, Place 1 drop into both eyes at bedtime. , Disp: , Rfl:  .  triamcinolone cream (KENALOG) 0.1 %, Apply 1 application topically daily as needed (to affected, irritated sites). , Disp: , Rfl:  No Known Allergies Social History   Tobacco Use  Smoking Status Never Smoker  Smokeless Tobacco Never Used   Objective:   Constitutional She is a pleasant 69y.o. African American female in NAD. AAO x 3.   Vascular Dorsalis pedis pulses faintly palpable bilaterally. Posterior tibial pulses faintly palpable bilaterally. Capillary refill normal to all digits.  No cyanosis or clubbing noted. Pedal hair growth absent. Lymphedema noted b/l LE.  Skin temperature gradient WNL b/l.   Neurologic Normal speech. Oriented to person, place, and time. Epicritic sensation to light touch grossly present bilaterally. Protective sensation intact 5/5 b/l. Vibratory sensation intact b/l.   Dermatologic Toenails 1-5 b/l are painful, elongated, discolored, dystrophic with subungual debris. Pain with dorsal palpation of nailplates. No erythema, no edema, no drainage noted. No open wounds. No skin lesions. Tinea pedis resolved b/l LE.  Orthopedic: Normal joint ROM without pain or crepitus bilaterally. S[place]age digits noted toes 1-5 b/l consistent with lymphedema. No bony tenderness.   Radiographs: None Assessment:   1. Pain due to onychomycosis of toenails of both feet    Plan:    Patient was evaluated and treated and all questions answered.  Onychomycosis with pain -Nails palliatively debridement as below. -Educated on self-care Procedure: Nail Debridement Rationale: Pain Type of Debridement: manual, sharp debridement. Instrumentation: Nail nipper, rotary burr. Number of Nails: 10 -No new findings. No new orders. -Toenails 1-5 b/l were debrided in length and girth with sterile nail nippers and dremel without iatrogenic bleeding.  -Patient to continue soft, supportive shoe gear daily. -Patient/POA to call should there be question/concern in the interim.  Return in about 3 months (around 06/10/2020) for nail trim.  JMarzetta Board DPM

## 2020-04-25 DIAGNOSIS — M1712 Unilateral primary osteoarthritis, left knee: Secondary | ICD-10-CM | POA: Insufficient documentation

## 2020-05-07 ENCOUNTER — Encounter (HOSPITAL_COMMUNITY): Payer: Self-pay

## 2020-05-07 ENCOUNTER — Ambulatory Visit (HOSPITAL_COMMUNITY)
Admission: EM | Admit: 2020-05-07 | Discharge: 2020-05-07 | Disposition: A | Discharge status: Home / Self Care | Payer: Medicare PPO | Attending: Urgent Care | Admitting: Urgent Care

## 2020-05-07 ENCOUNTER — Other Ambulatory Visit: Payer: Self-pay

## 2020-05-07 DIAGNOSIS — L03116 Cellulitis of left lower limb: Secondary | ICD-10-CM

## 2020-05-07 DIAGNOSIS — I509 Heart failure, unspecified: Secondary | ICD-10-CM

## 2020-05-07 DIAGNOSIS — I890 Lymphedema, not elsewhere classified: Secondary | ICD-10-CM

## 2020-05-07 MED ORDER — DOXYCYCLINE HYCLATE 100 MG PO CAPS: 100.0000 mg | ORAL_CAPSULE | Freq: Two times a day (BID) | ORAL | 0 refills | Status: DC

## 2020-05-07 MED ORDER — ACETAMINOPHEN 325 MG PO TABS
650.0000 mg | ORAL_TABLET | Freq: Once | ORAL | Status: AC
  Administered 2020-05-07: 650 mg via ORAL

## 2020-05-07 MED ORDER — CEPHALEXIN 500 MG PO CAPS: 500.0000 mg | ORAL_CAPSULE | Freq: Three times a day (TID) | ORAL | 0 refills | Status: DC

## 2020-05-07 MED ORDER — ACETAMINOPHEN 325 MG PO TABS
ORAL_TABLET | ORAL | Status: AC
  Filled 2020-05-07: qty 2

## 2020-05-07 NOTE — ED Triage Notes (Signed)
Pt presents with bilateral leg swelling since waking up this morning;  Pt states it is a little sore & red.  Pt has lymphedema and wears compression stockings everyday.

## 2020-05-07 NOTE — ED Provider Notes (Signed)
[place]   MRN: [id number] DOB: 1951-06-11  Subjective:   Jessica Yates is a 69 y.o. female presenting for acute onset this morning of recurrent bilateral lower leg swelling and pain, L>>R. Has a hx of cellulitis, abscess of same leg. Has required hospitalization before. Last episode was a year ago, started Keflex and did not work. Then had to be hospitalized. Has a hx of lymphedema, remote hx of dvt. Takes aspirin only for this. Denies fever, cp, shob, n/v, abdominal pain.   No current facility-administered medications for this encounter.  Current Outpatient Medications:  .  acetaminophen (TYLENOL) 500 MG tablet, Take 1,000 mg by mouth every 6 (six) hours as needed for mild pain. , Disp: , Rfl:  .  aspirin 81 MG tablet, Take 81 mg by mouth daily.  , Disp: , Rfl:  .  atorvastatin (LIPITOR) 10 MG tablet, , Disp: , Rfl:  .  blood glucose meter kit and supplies KIT, Dispense based on patient and insurance preference. Use up to four times daily as directed. (FOR ICD-9 250.00, 250.01)., Disp: 1 each, Rfl: 0 .  Cholecalciferol (VITAMIN D3) 25 MCG (1000 UT) CAPS, Take 1 capsule (1,000 Units total) by mouth daily. (Patient not taking: Reported on 09/26/2019), Disp: 30 capsule, Rfl: 0 .  clotrimazole (LOTRIMIN) 1 % cream, Apply to both feet and between toes twice daily for 4 weeks., Disp: 45 g, Rfl: 1 .  diclofenac Sodium (VOLTAREN) 1 % GEL, Apply 2-4 g topically 4 (four) times daily as needed (for pain)., Disp: , Rfl:  .  famotidine (PEPCID) 20 MG tablet, Take 20 mg by mouth 2 (two) times daily., Disp: , Rfl:  .  ferrous sulfate 325 (65 FE) MG tablet, Take 325 mg by mouth 3 (three) times a week. , Disp: , Rfl:  .  fluticasone (FLONASE) 50 MCG/ACT nasal spray, Place 1-2 sprays into both nostrils daily as needed for allergies or rhinitis. , Disp: , Rfl:  .  furosemide (LASIX) 40 MG tablet, $RemoveBe'40mg'IDjcJMIGB$  twice daily for 5 days, then $RemoveBe'40mg'mENiAIJiu$  once daily. (Patient taking differently: Take 40 mg by mouth  daily. ), Disp: 60 tablet, Rfl: 0 .  gabapentin (NEURONTIN) 100 MG capsule, Take 1 capsule (100 mg total) by mouth 2 (two) times daily. (Patient not taking: Reported on 08/05/2019), Disp: 30 capsule, Rfl: 0 .  glipiZIDE (GLUCOTROL) 10 MG tablet, Take 10 mg by mouth daily before breakfast. , Disp: , Rfl:  .  glipizide-metformin (METAGLIP) 2.5-250 MG tablet, Take by mouth., Disp: , Rfl:  .  hydrocerin (EUCERIN) CREA, Apply 1 application topically 2 (two) times daily., Disp: , Rfl: 0 .  ipratropium (ATROVENT) 0.03 % nasal spray, Place 2 sprays into the nose daily as needed for rhinitis. , Disp: , Rfl: 6 .  mirabegron ER (MYRBETRIQ) 25 MG TB24 tablet, Take 25 mg by mouth daily as needed (for urinary issues)., Disp: , Rfl:  .  omeprazole (PRILOSEC) 20 MG capsule, omeprazole 20 mg capsule,delayed release, Disp: , Rfl:  .  potassium chloride SA (K-DUR,KLOR-CON) 20 MEQ tablet, Take 20 mEq by mouth daily. , Disp: , Rfl:  .  traMADol (ULTRAM) 50 MG tablet, Take 50 mg by mouth daily as needed (for pain)., Disp: , Rfl:  .  TRAVATAN Z 0.004 % SOLN ophthalmic solution, Place 1 drop into both eyes at bedtime. , Disp: , Rfl:  .  triamcinolone cream (KENALOG) 0.1 %, Apply 1 application topically daily as needed (to affected, irritated sites). , Disp: ,  Rfl:    No Known Allergies  Past Medical History:  Diagnosis Date  . Anemia 2002  . Anxiety   . Arthritis   . Asthma   . Blood clot in vein   . Cellulitis   . Cough   . Depression 02/06/2008  . DVT (deep venous thrombosis) ([place]) 2002   left leg  . Glaucoma   . Lymphedema   . Palpitations   . Peripheral venous insufficiency   . Seasonal allergies   . Sickle cell trait ([place])   . Vitamin D deficiency      Past Surgical History:  Procedure Laterality Date  . COSMETIC SURGERY  1975   nasal reconstruction  . TOTAL ABDOMINAL HYSTERECTOMY  07/2001    Family History  Problem Relation Age of Onset  . Allergies Mother   . Asthma Mother   . Other  Mother        enlarged heart  . Arthritis Mother   . Breast cancer Mother   . Diabetes Mother   . Hypertension Mother   . Cancer Mother        breast  . Hyperlipidemia Mother   . Heart disease Maternal Grandmother   . Breast cancer Maternal Grandmother   . Birth defects Maternal Grandmother        breast  . Cancer Father 57       leukemia  . Allergies Sister   . Arthritis Sister     Social History   Tobacco Use  . Smoking status: Never Smoker  . Smokeless tobacco: Never Used  Substance Use Topics  . Alcohol use: No  . Drug use: No    ROS   Objective:   Vitals: BP (!) 143/80 (BP Location: Right Arm)   Pulse (!) 115   Temp 100.2 F (37.9 C) (Oral)   Resp 18   SpO2 100%   Physical Exam Constitutional:      General: She is not in acute distress.    Appearance: Normal appearance. She is well-developed. She is ill-appearing. She is not toxic-appearing or diaphoretic.  HENT:     Head: Normocephalic and atraumatic.     Left Ear: External ear normal.     Nose: Nose normal.     Mouth/Throat:     Mouth: Mucous membranes are moist.  Eyes:     Extraocular Movements: Extraocular movements intact.     Pupils: Pupils are equal, round, and reactive to light.  Cardiovascular:     Rate and Rhythm: Normal rate and regular rhythm.     Pulses: Normal pulses.     Heart sounds: Normal heart sounds. No murmur heard.  No friction rub. No gallop.   Pulmonary:     Effort: Pulmonary effort is normal. No respiratory distress.     Breath sounds: Normal breath sounds. No stridor. No wheezing, rhonchi or rales.  Musculoskeletal:     Right lower leg: Edema (1+) present.     Left lower leg: Edema (2+) present.  Skin:    General: Skin is warm and dry.     Findings: Erythema (lower left leg from inferior portion of calf to ankle with warmth, tenderness) present. No rash.  Neurological:     Mental Status: She is alert and oriented to person, place, and time.  Psychiatric:         Mood and Affect: Mood normal.        Behavior: Behavior normal.        Thought Content: Thought content normal.  Judgment: Judgment normal.     Assessment and Plan :   PDMP not reviewed this encounter.  1. Cellulitis of leg, left   2. Lymphedema   3. Chronic congestive heart failure, unspecified heart failure type [facility])     Patient has significant risk factors. She does not want to go to the ER, hospital tonight. Counseled on risks and patient verbalizes understanding recalling her previous hospitalizations. She is febrile and tachycardic now. Counseled on possibility of become septic but she is not hypotensive or tachypneic. Will trial doxycycline, schedule APAP. If not improvement by tomorrow afternoon will report to the ER, hospital. APAP given in clinic. Counseled patient on potential for adverse effects with medications prescribed/recommended today, ER and return-to-clinic precautions discussed, patient verbalized understanding.    Jaynee Eagles, [place] 05/08/20 [phone]

## 2020-05-07 NOTE — Discharge Instructions (Signed)
Please start doxycycline tonight, twice daily. Do not use any nonsteroidal anti-inflammatories (NSAIDs) like ibuprofen, Motrin, naproxen, Aleve, etc. which are all available over-the-counter.  Please just use Tylenol at a dose of 500mg -650mg  once every 6 hours as needed for your aches, pains, fevers.  If you do not have improvement tomorrow afternoon and get worse you have to go to the emergency room.

## 2020-05-29 ENCOUNTER — Other Ambulatory Visit: Payer: Self-pay

## 2020-05-29 ENCOUNTER — Ambulatory Visit (HOSPITAL_COMMUNITY)
Admission: EM | Admit: 2020-05-29 | Discharge: 2020-05-29 | Disposition: A | Discharge status: Home / Self Care | Payer: Medicare PPO | Attending: Physician Assistant | Admitting: Physician Assistant

## 2020-05-29 ENCOUNTER — Encounter (HOSPITAL_COMMUNITY): Payer: Self-pay | Admitting: Emergency Medicine

## 2020-05-29 DIAGNOSIS — L03116 Cellulitis of left lower limb: Secondary | ICD-10-CM

## 2020-05-29 DIAGNOSIS — M7989 Other specified soft tissue disorders: Secondary | ICD-10-CM

## 2020-05-29 LAB — CBG MONITORING, ED: Glucose-Capillary: 104 mg/dL — ABNORMAL HIGH (ref 70–99)

## 2020-05-29 MED ORDER — DOXYCYCLINE HYCLATE 100 MG PO CAPS: 100.0000 mg | ORAL_CAPSULE | Freq: Two times a day (BID) | ORAL | 0 refills | Status: DC

## 2020-05-29 NOTE — ED Triage Notes (Signed)
Pt c/o bilateral leg swelling and soreness. She states the left side is worse. Pt states she has been wearing her compression stockings.

## 2020-05-29 NOTE — Discharge Instructions (Signed)
Go to the Emergency department if redness increases or fever over 100.5

## 2020-05-29 NOTE — ED Provider Notes (Signed)
[place]    CSN: [id number] Arrival date & time: 05/29/20  1732      History   Chief Complaint Chief Complaint  Patient presents with  . Leg Swelling    HPI Jessica Yates is a 69 y.o. female.   The history is provided by the patient. No language interpreter was used.  Extremity Pain This is a recurrent problem. The current episode started more than 2 days ago. The problem occurs constantly. The problem has been gradually worsening. Pertinent negatives include no shortness of breath. Nothing aggravates the symptoms. Nothing relieves the symptoms. She has tried nothing for the symptoms. The treatment provided no relief.  Pt complains of swelling to left leg.  Pt reports she has had multiple episodes in the past.  Pt reports she was seen here for the same last month.  Pt reports she was getting better until she finished antibiotics.  Pt reports doxycycline works better than keflex.   Pt has lymphadema.  Pt is usually worse on left side   Past Medical History:  Diagnosis Date  . Anemia 2002  . Anxiety   . Arthritis   . Asthma   . Blood clot in vein   . Cellulitis   . Cough   . Depression 02/06/2008  . DVT (deep venous thrombosis) ([place]) 2002   left leg  . Glaucoma   . Lymphedema   . Palpitations   . Peripheral venous insufficiency   . Seasonal allergies   . Sickle cell trait ([place])   . Vitamin D deficiency     Patient Active Problem List   Diagnosis Date Noted  . Cellulitis 05/13/2019  . Cellulitis of right lower extremity 05/12/2019  . CKD (chronic kidney disease) stage 3, GFR 30-59 ml/min 05/12/2019  . Normochromic normocytic anemia 05/12/2019  . Cellulitis, leg 05/12/2019  . Impacted cerumen of left ear 02/22/2019  . Laryngopharyngeal reflux (LPR) 02/22/2019  . 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  . Pain in left knee 11/13/2018  . Cellulitis and abscess of left leg 07/22/2018  . Primary open angle glaucoma of both eyes, mild stage 05/08/2018  .  Cough 05/07/2016  . Upper airway cough syndrome 05/07/2016  . Sebaceous cyst of labia 04/19/2016  . OAB (overactive bladder) 04/19/2016  . 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 Lymphedema 05/03/2012  . CHF (congestive heart failure) ([place]) 04/16/2011  . SICKLE-CELL TRAIT 08/01/2007  . UNSPECIFIED GLAUCOMA 08/01/2007  . DVT 08/01/2007  . VENOUS INSUFFICIENCY 08/01/2007  . ALLERGIC RHINITIS 08/01/2007  . Extrinsic asthma 08/01/2007    Past Surgical History:  Procedure Laterality Date  . COSMETIC SURGERY  1975   nasal reconstruction  . TOTAL ABDOMINAL HYSTERECTOMY  07/2001    OB History    Gravida  0   Para  0   Term  0   Preterm  0   AB  0   Living  0     SAB  0   TAB  0   Ectopic  0   Multiple  0   Live Births  0            Home Medications    Prior to Admission medications   Medication Sig Start Date End Date Taking? Authorizing Provider  acetaminophen (TYLENOL) 500 MG tablet Take 1,000 mg by mouth every 6 (six) hours as needed for mild pain.     [provider]  aspirin 81 MG tablet Take 81 mg by mouth daily.  [provider]  atorvastatin (LIPITOR) 10 MG tablet  08/17/19   [provider]  blood glucose meter kit and supplies KIT Dispense based on patient and insurance preference. Use up to four times daily as directed. (FOR ICD-9 250.00, 250.01). 05/20/19   Kayleen Memos, DO  Cholecalciferol (VITAMIN D3) 25 MCG (1000 UT) CAPS Take 1 capsule (1,000 Units total) by mouth daily. Patient not taking: Reported on 09/26/2019 05/20/19   Kayleen Memos, DO  clotrimazole (LOTRIMIN) 1 % cream Apply to both feet and between toes twice daily for 4 weeks. 12/04/19   Marzetta Board, DPM  diclofenac Sodium (VOLTAREN) 1 % GEL Apply 2-4 g topically 4 (four) times daily as needed (for pain).    [provider]  doxycycline (VIBRAMYCIN) 100 MG capsule Take 1 capsule (100 mg total) by mouth 2 (two) times  daily. 05/29/20   Fransico Meadow, PA-C  famotidine (PEPCID) 20 MG tablet Take 20 mg by mouth 2 (two) times daily. 07/25/19   [provider]  ferrous sulfate 325 (65 FE) MG tablet Take 325 mg by mouth 3 (three) times a week.     [provider]  fluticasone (FLONASE) 50 MCG/ACT nasal spray Place 1-2 sprays into both nostrils daily as needed for allergies or rhinitis.  07/25/19   [provider]  furosemide (LASIX) 40 MG tablet 38m twice daily for 5 days, then 445monce daily. Patient taking differently: Take 40 mg by mouth daily.  07/28/18   KrBonnielee HaffMD  gabapentin (NEURONTIN) 100 MG capsule Take 1 capsule (100 mg total) by mouth 2 (two) times daily. Patient not taking: Reported on 08/05/2019 05/20/19   HaKayleen MemosDO  glipiZIDE (GLUCOTROL) 10 MG tablet Take 10 mg by mouth daily before breakfast.  05/25/19   [provider]  glipizide-metformin (METAGLIP) 2.5-250 MG tablet Take by mouth.    [provider]  hydrocerin (EUCERIN) CREA Apply 1 application topically 2 (two) times daily. 08/12/19   NeMariel AloeMD  ipratropium (ATROVENT) 0.03 % nasal spray Place 2 sprays into the nose daily as needed for rhinitis.  04/18/18   [provider]  mirabegron ER (MYRBETRIQ) 25 MG TB24 tablet Take 25 mg by mouth daily as needed (for urinary issues).    [provider]  omeprazole (PRILOSEC) 20 MG capsule omeprazole 20 mg capsule,delayed release    [provider]  potassium chloride SA (K-DUR,KLOR-CON) 20 MEQ tablet Take 20 mEq by mouth daily.  04/27/16   [provider]  traMADol (ULTRAM) 50 MG tablet Take 50 mg by mouth daily as needed (for pain).    [provider]  TRAVATAN Z 0.004 % SOLN ophthalmic solution Place 1 drop into both eyes at bedtime.  03/14/11   [provider]  triamcinolone cream (KENALOG) 0.1 % Apply 1 application topically daily as needed (to affected, irritated sites).     [provider]  metFORMIN (GLUCOPHAGE) 500 MG tablet Take 1 tablet (500 mg total) by mouth 2 (two) times daily with a meal. Patient not taking: Reported on 06/14/2019 05/20/19 08/02/19  HaKayleen MemosDO    Family History Family History  Problem Relation Age of Onset  . Allergies Mother   . Asthma Mother   . Other Mother        enlarged heart  . Arthritis Mother   . Breast cancer Mother   . Diabetes Mother   . Hypertension Mother   . Cancer  Mother        breast  . Hyperlipidemia Mother   . Heart disease Maternal Grandmother   . Breast cancer Maternal Grandmother   . Birth defects Maternal Grandmother        breast  . Cancer Father 74       leukemia  . Allergies Sister   . Arthritis Sister     Social History Social History   Tobacco Use  . Smoking status: Never Smoker  . Smokeless tobacco: Never Used  Substance Use Topics  . Alcohol use: No  . Drug use: No     Allergies   Patient has no known allergies.   Review of Systems Review of Systems  Respiratory: Negative for shortness of breath.   All other systems reviewed and are negative.    Physical Exam Triage Vital Signs ED Triage Vitals  Enc Vitals Group     BP 05/29/20 1933 (!) 134/119     Pulse Rate 05/29/20 1933 99     Resp 05/29/20 1933 16     Temp 05/29/20 1933 100.1 F (37.8 C)     Temp Source 05/29/20 1933 Oral     SpO2 05/29/20 1933 100 %     Weight --      Height --      Head Circumference --      Peak Flow --      Pain Score 05/29/20 1930 8     Pain Loc --      Pain Edu? --      Excl. in Portal? --    No data found.  Updated Vital Signs BP (!) 134/119 (BP Location: Left Arm)   Pulse 99   Temp 100.1 F (37.8 C) (Oral)   Resp 16   SpO2 100%   Visual Acuity Right Eye Distance:   Left Eye Distance:   Bilateral Distance:    Right Eye Near:   Left Eye Near:    Bilateral Near:     Physical Exam Vitals and nursing note reviewed.  Constitutional:      Appearance: She is  well-developed.  HENT:     Head: Normocephalic.  Cardiovascular:     Rate and Rhythm: Normal rate and regular rhythm.  Pulmonary:     Effort: Pulmonary effort is normal.  Abdominal:     General: There is no distension.  Musculoskeletal:        General: Swelling and tenderness present.     Cervical back: Normal range of motion.     Right lower leg: Edema present.     Left lower leg: Edema present.     Comments: Increased warmth left leg compared to right edema, bilat lymphadema   Skin:    General: Skin is warm.  Neurological:     General: No focal deficit present.     Mental Status: She is alert and oriented to person, place, and time.  Psychiatric:        Mood and Affect: Mood normal.      UC Treatments / Results  Labs (all labs ordered are listed, but only abnormal results are displayed) Labs Reviewed  CBG MONITORING, ED - Abnormal; Notable for the following components:      Result Value   Glucose-Capillary 104 (*)    All other components within normal limits    EKG   Radiology No results found.  Procedures Procedures (including critical care time)  Medications Ordered in UC Medications - No data to display  Initial  Impression / Assessment and Plan / UC Course  I have reviewed the triage vital signs and the nursing notes.  Pertinent labs & imaging results that were available during my care of the patient were reviewed by me and considered in my medical decision making (see chart for details).     MDM:  I discussed symptoms and my concerns with pt.  I will given her rx for doxycycline.  Pt agrees to go to Ed if increaing redness, swelling or fever over 100.5.  Pt is advised to see her MD for recheck of multiple medical concerns  Final Clinical Impressions(s) / UC Diagnoses   Final diagnoses:  Cellulitis of left lower extremity     Discharge Instructions     Go to the Emergency department if redness increases or fever over 100.5    ED Prescriptions      Medication Sig Dispense Auth. Provider   doxycycline (VIBRAMYCIN) 100 MG capsule Take 1 capsule (100 mg total) by mouth 2 (two) times daily. 20 capsule Fransico Meadow, [place]     PDMP not reviewed this encounter.  An After Visit Summary was printed and given to the patient.    Fransico Meadow, [place] 05/29/20 2051

## 2020-06-16 ENCOUNTER — Ambulatory Visit (INDEPENDENT_AMBULATORY_CARE_PROVIDER_SITE_OTHER): Payer: Medicare PPO | Admitting: Podiatry

## 2020-06-16 ENCOUNTER — Other Ambulatory Visit: Payer: Self-pay

## 2020-06-16 ENCOUNTER — Encounter: Payer: Self-pay | Admitting: Podiatry

## 2020-06-16 DIAGNOSIS — B351 Tinea unguium: Secondary | ICD-10-CM

## 2020-06-16 DIAGNOSIS — M79675 Pain in left toe(s): Secondary | ICD-10-CM

## 2020-06-16 DIAGNOSIS — N183 Chronic kidney disease, stage 3 unspecified: Secondary | ICD-10-CM

## 2020-06-16 DIAGNOSIS — I890 Lymphedema, not elsewhere classified: Secondary | ICD-10-CM

## 2020-06-16 DIAGNOSIS — M79674 Pain in right toe(s): Secondary | ICD-10-CM

## 2020-06-20 NOTE — Progress Notes (Signed)
Subjective:  Patient ID: Jessica Yates, female    DOB: December 14, 1950,  MRN: [id number]  69 y.o. female presents with painful thick toenails that are difficult to trim. Pain interferes with ambulation. Aggravating factors include wearing enclosed shoe gear. Pain is relieved with periodic professional debridement.   She states she has been treated for episode of lower extremity cellulitis. She is now wearing leg wraps. States she cut the top of them due to feeling too tight.   Review of Systems: Negative except as noted in the HPI. Denies N/V/F/Ch.  Past Medical History:  Diagnosis Date  . Anemia 2002  . Anxiety   . Arthritis   . Asthma   . Blood clot in vein   . Cellulitis   . Cough   . Depression 02/06/2008  . DVT (deep venous thrombosis) ([place]) 2002   left leg  . Glaucoma   . Lymphedema   . Palpitations   . Peripheral venous insufficiency   . Seasonal allergies   . Sickle cell trait ([place])   . Vitamin D deficiency    Past Surgical History:  Procedure Laterality Date  . COSMETIC SURGERY  1975   nasal reconstruction  . TOTAL ABDOMINAL HYSTERECTOMY  07/2001   Patient Active Problem List   Diagnosis Date Noted  . Osteoarthritis of left knee 04/25/2020  . Cellulitis 05/13/2019  . Cellulitis of right lower extremity 05/12/2019  . CKD (chronic kidney disease) stage 3, GFR 30-59 ml/min (HCC) 05/12/2019  . Normochromic normocytic anemia 05/12/2019  . Cellulitis, leg 05/12/2019  . Impacted cerumen of left ear 02/22/2019  . Laryngopharyngeal reflux (LPR) 02/22/2019  . 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  . Pain in left knee 11/13/2018  . Cellulitis and abscess of left leg 07/22/2018  . Primary open angle glaucoma of both eyes, mild stage 05/08/2018  . Cough 05/07/2016  . Upper airway cough syndrome 05/07/2016  . Sebaceous cyst of labia 04/19/2016  . OAB (overactive bladder) 04/19/2016  . 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 Lymphedema 05/03/2012   . CHF (congestive heart failure) ([place]) 04/16/2011  . SICKLE-CELL TRAIT 08/01/2007  . UNSPECIFIED GLAUCOMA 08/01/2007  . DVT 08/01/2007  . VENOUS INSUFFICIENCY 08/01/2007  . ALLERGIC RHINITIS 08/01/2007  . Extrinsic asthma 08/01/2007    Current Outpatient Medications:  .  acetaminophen (TYLENOL) 500 MG tablet, Take 1,000 mg by mouth every 6 (six) hours as needed for mild pain. , Disp: , Rfl:  .  albuterol (VENTOLIN HFA) 108 (90 Base) MCG/ACT inhaler, , Disp: , Rfl:  .  aspirin 81 MG tablet, Take 81 mg by mouth daily.  , Disp: , Rfl:  .  atorvastatin (LIPITOR) 10 MG tablet, , Disp: , Rfl:  .  blood glucose meter kit and supplies KIT, Dispense based on patient and insurance preference. Use up to four times daily as directed. (FOR ICD-9 250.00, 250.01)., Disp: 1 each, Rfl: 0 .  cephALEXin (KEFLEX) 500 MG capsule, Take 500 mg by mouth 3 (three) times daily., Disp: , Rfl:  .  Cholecalciferol (VITAMIN D3) 25 MCG (1000 UT) CAPS, Take 1 capsule (1,000 Units total) by mouth daily. (Patient not taking: Reported on 09/26/2019), Disp: 30 capsule, Rfl: 0 .  clotrimazole (LOTRIMIN) 1 % cream, Apply to both feet and between toes twice daily for 4 weeks., Disp: 45 g, Rfl: 1 .  diclofenac Sodium (VOLTAREN) 1 % GEL, Apply 2-4 g topically 4 (four) times daily as needed (for pain)., Disp: , Rfl:  .  doxycycline (  VIBRAMYCIN) 100 MG capsule, Take 1 capsule (100 mg total) by mouth 2 (two) times daily., Disp: 20 capsule, Rfl: 0 .  famotidine (PEPCID) 20 MG tablet, Take 20 mg by mouth 2 (two) times daily., Disp: , Rfl:  .  ferrous sulfate 325 (65 FE) MG tablet, Take 325 mg by mouth 3 (three) times a week. , Disp: , Rfl:  .  fluticasone (FLONASE) 50 MCG/ACT nasal spray, Place 1-2 sprays into both nostrils daily as needed for allergies or rhinitis. , Disp: , Rfl:  .  furosemide (LASIX) 40 MG tablet, $RemoveBe'40mg'wrhiRRdfg$  twice daily for 5 days, then $RemoveBe'40mg'XQczVkRwT$  once daily. (Patient taking differently: Take 40 mg by mouth daily. ), Disp: 60  tablet, Rfl: 0 .  gabapentin (NEURONTIN) 100 MG capsule, Take 1 capsule (100 mg total) by mouth 2 (two) times daily. (Patient not taking: Reported on 08/05/2019), Disp: 30 capsule, Rfl: 0 .  glipiZIDE (GLUCOTROL) 10 MG tablet, Take 10 mg by mouth daily before breakfast. , Disp: , Rfl:  .  glipizide-metformin (METAGLIP) 2.5-250 MG tablet, Take by mouth., Disp: , Rfl:  .  hydrocerin (EUCERIN) CREA, Apply 1 application topically 2 (two) times daily., Disp: , Rfl: 0 .  ipratropium (ATROVENT) 0.03 % nasal spray, Place 2 sprays into the nose daily as needed for rhinitis. , Disp: , Rfl: 6 .  mirabegron ER (MYRBETRIQ) 25 MG TB24 tablet, Take 25 mg by mouth daily as needed (for urinary issues)., Disp: , Rfl:  .  omeprazole (PRILOSEC) 20 MG capsule, omeprazole 20 mg capsule,delayed release, Disp: , Rfl:  .  pantoprazole (PROTONIX) 40 MG tablet, , Disp: , Rfl:  .  potassium chloride SA (K-DUR,KLOR-CON) 20 MEQ tablet, Take 20 mEq by mouth daily. , Disp: , Rfl:  .  traMADol (ULTRAM) 50 MG tablet, Take 50 mg by mouth daily as needed (for pain)., Disp: , Rfl:  .  TRAVATAN Z 0.004 % SOLN ophthalmic solution, Place 1 drop into both eyes at bedtime. , Disp: , Rfl:  .  triamcinolone cream (KENALOG) 0.1 %, Apply 1 application topically daily as needed (to affected, irritated sites). , Disp: , Rfl:  No Known Allergies Social History   Tobacco Use  Smoking Status Never Smoker  Smokeless Tobacco Never Used   Objective:   Constitutional She is a pleasant 69 y.o. African American female in NAD. AAO x 3.   Vascular Dorsalis pedis pulses faintly palpable bilaterally. Posterior tibial pulses faintly palpable bilaterally. Capillary refill normal to all digits.  No cyanosis or clubbing noted. Pedal hair growth absent. Lymphedema noted b/l LE. Bilateral LE leg wraps in place which are clean, dry and intact. Skin temperature gradient WNL b/l.   Neurologic Normal speech. Oriented to person, place, and time. Epicritic  sensation to light touch grossly present bilaterally. Protective sensation intact 5/5 b/l. Vibratory sensation intact b/l.   Dermatologic Toenails 1-5 b/l are painful, elongated, discolored, dystrophic with subungual debris. Pain with dorsal palpation of nailplates. No erythema, no edema, no drainage noted. No open wounds. No skin lesions. Tinea pedis resolved b/l LE.  Orthopedic: Normal joint ROM without pain or crepitus bilaterally. Sausage digits noted toes 1-5 b/l consistent with lymphedema. No bony tenderness.   Radiographs: None Assessment:   1. Pain due to onychomycosis of toenails of both feet   2. Lymphedema   3. Stage 3 chronic kidney disease, unspecified whether stage 3a or 3b CKD ([place])    Plan:  Patient was evaluated and treated and all questions answered.  Onychomycosis  with pain -Nails palliatively debridement as below. -Educated on self-care Procedure: Nail Debridement Rationale: Pain Type of Debridement: manual, sharp debridement. Instrumentation: Nail nipper, rotary burr. Number of Nails: 10 -No new findings. No new orders. -Toenails 1-5 b/l were debrided in length and girth with sterile nail nippers and dremel without iatrogenic bleeding.  -Patient to continue soft, supportive shoe gear daily. -Patient/POA to call should there be question/concern in the interim.  Return in about 3 months (around 09/16/2020).  Marzetta Board, DPM

## 2020-07-02 ENCOUNTER — Telehealth: Payer: Self-pay

## 2020-07-02 NOTE — Telephone Encounter (Signed)
NOTES ON FILE FROM [facility] [phone], SENT REFERRAL TO SCHEDULING 

## 2020-07-06 NOTE — Progress Notes (Signed)
Cardiology Office Note:    Date:  07/07/2020   ID:  Jessica Yates, DOB [birth year], MRN [id number]  PCP:  Sonia Side., FNP  [facility] Cardiologist:  Freada Bergeron, MD  [facility] Electrophysiologist:  None   Referring MD: Andree Moro, DO    History of Present Illness:    Jessica Yates is a 69 y.o. female with a hx of asthma, diastolic dysfunction, chronic lymphedema, DMII, CKD stage 3, and DVT who was referred by Dr. Posey Pronto for evaluation of diastolic dysfunction  Patient states that she has chronic lymphedema (started at age 56) and has recurrent cellulitis. Has difficulty ambulating due to LE swelling and arthritis. Walks with cane, but no exertional symptoms. No known history of CAD. Was told she had "congestive heart failure," but is unsure why she was diagnosed with this. States she has never been hospitalized for fluid in her lungs, worsening SOB, or chest pain. Has chronic LE edema due to lymphedema which is managed by vascular. Denies chest pain, SOB on exertion, palpitations, nausea or vomiting, abdominal pain. No orthopnea or PND. For her lymphedema, she does LE wrapping, compression stockings, LE elevation, and lasix.  TTE in 2019 with normal BiV function, G1DD, no valvular abnormalities. Blood pressure well controlled in the office today at 116/62.   Of note, has significant LLE swelling >> right. Was recently treated for cellulitis. LLE is red and warm to the touch. Has planned follow-up with vascular for ultrasound.   Past Medical History:  Diagnosis Date  . Allergic rhinitis   . Anemia 2002  . Anxiety   . Arthritis   . Asthma   . Blood clot in vein   . Cellulitis   . Cellulitis of right lower extremity   . Chronic diastolic (congestive) heart failure ([place])   . Chronic kidney disease, stage 3 ([place])   . Coagulation defect ([place])   . Cough   . Depression 02/06/2008  . Diabetes mellitus, type 2 ([place])   . DVT (deep venous thrombosis) ([place])  2002   left leg  . Dysphagia   . Elevated hemoglobin A1c   . GERD (gastroesophageal reflux disease)   . Glaucoma   . Holosystolic murmur   . Kidney cysts   . Leukocytosis   . Liver cyst   . Lymphedema   . Morbid obesity ([place])   . Overactive bladder   . Palpitations   . Peripheral venous insufficiency   . Primary open angle glaucoma (POAG) of both eyes, mild stage   . Seasonal allergies   . Sickle cell trait ([place])   . Tortuous aorta (HCC)   . Unstable gait   . Upper airway cough syndrome   . Vitamin D deficiency     Past Surgical History:  Procedure Laterality Date  . COSMETIC SURGERY  1975   nasal reconstruction  . TOTAL ABDOMINAL HYSTERECTOMY  07/2001    Current Medications: Current Meds  Medication Sig  . acetaminophen (TYLENOL) 500 MG tablet Take 1,000 mg by mouth every 6 (six) hours as needed for mild pain.   Marland Kitchen albuterol (VENTOLIN HFA) 108 (90 Base) MCG/ACT inhaler   . aspirin 81 MG tablet Take 81 mg by mouth daily.    Marland Kitchen atorvastatin (LIPITOR) 10 MG tablet   . blood glucose meter kit and supplies KIT Dispense based on patient and insurance preference. Use up to four times daily as directed. (FOR ICD-9 250.00, 250.01).  . cephALEXin (KEFLEX) 500 MG capsule Take 500  mg by mouth 3 (three) times daily.  . Cholecalciferol (VITAMIN D3) 25 MCG (1000 UT) CAPS Take 1 capsule (1,000 Units total) by mouth daily.  . clotrimazole (LOTRIMIN) 1 % cream Apply to both feet and between toes twice daily for 4 weeks.  . diclofenac Sodium (VOLTAREN) 1 % GEL Apply 2-4 g topically 4 (four) times daily as needed (for pain).  Marland Kitchen doxycycline (VIBRAMYCIN) 100 MG capsule Take 1 capsule (100 mg total) by mouth 2 (two) times daily.  . famotidine (PEPCID) 20 MG tablet Take 20 mg by mouth 2 (two) times daily.  . ferrous sulfate 325 (65 FE) MG tablet Take 325 mg by mouth 3 (three) times a week.   . fluticasone (FLONASE) 50 MCG/ACT nasal spray Place 1-2 sprays into both nostrils daily as needed for  allergies or rhinitis.   . furosemide (LASIX) 40 MG tablet $RemoveB'40mg'nNSDijAo$  twice daily for 5 days, then $RemoveBe'40mg'UxxIvgwqy$  once daily.  Marland Kitchen gabapentin (NEURONTIN) 100 MG capsule Take 1 capsule (100 mg total) by mouth 2 (two) times daily.  Marland Kitchen glipiZIDE (GLUCOTROL) 10 MG tablet Take 10 mg by mouth daily before breakfast.   . glipizide-metformin (METAGLIP) 2.5-250 MG tablet Take by mouth.  . hydrocerin (EUCERIN) CREA Apply 1 application topically 2 (two) times daily.  Marland Kitchen ipratropium (ATROVENT) 0.03 % nasal spray Place 2 sprays into the nose daily as needed for rhinitis.   Marland Kitchen mirabegron ER (MYRBETRIQ) 25 MG TB24 tablet Take 25 mg by mouth daily as needed (for urinary issues).  Marland Kitchen omeprazole (PRILOSEC) 20 MG capsule omeprazole 20 mg capsule,delayed release  . pantoprazole (PROTONIX) 40 MG tablet   . potassium chloride SA (K-DUR,KLOR-CON) 20 MEQ tablet Take 20 mEq by mouth daily.   . traMADol (ULTRAM) 50 MG tablet Take 50 mg by mouth daily as needed (for pain).  . TRAVATAN Z 0.004 % SOLN ophthalmic solution Place 1 drop into both eyes at bedtime.   . triamcinolone cream (KENALOG) 0.1 % Apply 1 application topically daily as needed (to affected, irritated sites).      Allergies:   Patient has no known allergies.   Social History   Socioeconomic History  . Marital status: Single    Spouse name: Not on file  . Number of children: 0  . Years of education: Not on file  . Highest education level: Not on file  Occupational History  . Occupation: retired from [employer]  Tobacco Use  . Smoking status: Never Smoker  . Smokeless tobacco: Never Used  Vaping Use  . Vaping Use: Never used  Substance and Sexual Activity  . Alcohol use: No  . Drug use: No  . Sexual activity: Never  Other Topics Concern  . Not on file  Social History Narrative   Lives with mother and brother         Social Determinants of Health   Financial Resource Strain:   . Difficulty of Paying Living Expenses: Not on file  Food  Insecurity:   . Worried About Charity fundraiser in the Last Year: Not on file  . Ran Out of Food in the Last Year: Not on file  Transportation Needs:   . Lack of Transportation (Medical): Not on file  . Lack of Transportation (Non-Medical): Not on file  Physical Activity:   . Days of Exercise per Week: Not on file  . Minutes of Exercise per Session: Not on file  Stress:   . Feeling of Stress : Not on file  Social  Connections:   . Frequency of Communication with Friends and Family: Not on file  . Frequency of Social Gatherings with Friends and Family: Not on file  . Attends Religious Services: Not on file  . Active Member of Clubs or Organizations: Not on file  . Attends Archivist Meetings: Not on file  . Marital Status: Not on file     Family History: The patient's family history includes Allergies in her mother and sister; Arthritis in her mother and sister; Asthma in her mother; Birth defects in her maternal grandmother; Breast cancer in her maternal grandmother and mother; Cancer in her mother; Cancer (age of onset: 57) in her father; Diabetes in her mother; Heart disease in her maternal grandmother; Hyperlipidemia in her mother; Hypertension in her mother; Other in her mother.  ROS:   Please see the history of present illness.    Review of Systems  Constitutional: Positive for fever. Negative for chills, diaphoresis and weight loss.  HENT: Negative for sinus pain.   Eyes: Negative for blurred vision.  Respiratory: Positive for cough. Negative for sputum production and shortness of breath.   Cardiovascular: Positive for leg swelling. Negative for chest pain, palpitations, orthopnea and PND.  Gastrointestinal: Positive for heartburn. Negative for abdominal pain, nausea and vomiting.  Genitourinary: Negative for hematuria.  Musculoskeletal: Positive for myalgias.  Neurological: Negative for dizziness.  Endo/Heme/Allergies: Negative for polydipsia.     EKGs/Labs/Other Studies Reviewed:    The following studies were reviewed today: 07/26/18: Study Conclusions  - Left ventricle: The cavity size was normal. Wall thickness was  normal. Systolic function was normal. The estimated ejection  fraction was in the range of 55% to 60%. Wall motion was normal;  there were no regional wall motion abnormalities. Doppler  parameters are consistent with abnormal left ventricular  relaxation (grade 1 diastolic dysfunction). The E/e&' ratio is  between 8-15, suggesting inderminate LV filling pressure.  - Mitral valve: Mildly thickened leaflets . There was trivial  regurgitation.  - Left atrium: The atrium was normal in size.  - Inferior vena cava: The vessel was normal in size. The  respirophasic diameter changes were in the normal range (>= 50%),  consistent with normal central venous pressure.   Impressions:   - LVEF 55-60%, normal wall thickness, normal wall motion, grade 1  DD, indeterminate LV filling pressure, trivial MR, normal LA  size, normal IVC.  LE doppler ultrasound 08/06/19: Summary:  Right: There is no evidence of deep vein thrombosis in the lower  extremity. No cystic structure found in the popliteal fossa.  Left: No evidence of common femoral vein obstruction.  CTA 12/28/15: FINDINGS: There are no filling defects within the pulmonary arteries to suggest pulmonary embolus.  Mild multi chamber cardiomegaly. Thoracic aorta is normal in caliber, tortuous in its course. No dissection or aneurysm. Conventional branching pattern from the aortic arch. No mediastinal or hilar adenopathy. No pericardial effusion.  No pleural effusion. Dependent linear atelectasis in both lower lobes. Mild biapical pleural parenchymal scarring. No consolidation, pulmonary mass or suspicious nodule.  No acute abnormality in the included upper abdomen.  There are no acute or suspicious osseous abnormalities.  Unchanged exaggerated thoracic kyphosis with associated degenerative change.  Review of the MIP images confirms the above findings.  IMPRESSION: 1. No pulmonary embolus. 2. Mild multi chamber cardiomegaly without pulmonary edema.  EKG:  EKG is ordered today.  The ekg ordered today demonstrates sinus tachycardia with borderline with strain pattern.  Recent Labs: 08/06/2019: ALT  12 08/10/2019: BUN 23; Creatinine, Ser 0.86; Hemoglobin 10.8; Platelets 387; Potassium 4.1; Sodium 140  Recent Lipid Panel No results found for: CHOL, TRIG, HDL, CHOLHDL, VLDL, LDLCALC, LDLDIRECT     Physical Exam:    VS:  BP 116/62   Pulse (!) 117   Ht 5\' 9"  (1.753 m)   Wt 230 lb (104.3 kg)   SpO2 96%   BMI 33.97 kg/m     Wt Readings from Last 3 Encounters:  07/07/20 230 lb (104.3 kg)  09/26/19 225 lb (102.1 kg)  08/05/19 220 lb (99.8 kg)     GEN: Comfortable HEENT: Normal NECK: No JVD; No carotid bruits LYMPHATICS/SKIN: Severe bilateral lymphedema with LLE>>RLE. Left shin and calf erythematous and warm to the touch. CARDIAC: RR, no murmurs RESPIRATORY:  Clear to auscultation without rales, wheezing or rhonchi  ABDOMEN: Obese, soft MUSCULOSKELETAL:  Severe 4+ edema with L>>R as detailed under lymphatics above NEUROLOGIC:  Alert and oriented x 3 PSYCHIATRIC:  Normal affect   ASSESSMENT:    1. Diastolic dysfunction   2. Lymphedema   3. Pure hypercholesterolemia    PLAN:    In order of problems listed above:  #Chronic Lymphedema #Chronic Venous Insufficiency #Cellulitis: Diagnosed at age 6. Patient with severe, bilateral LE with L>>R. Followed by vascular and currently using compression stockings, lasix, feet elevation with plans for pneumatic compression devices. Appears to have LLE cellulitis that according to the patient is improved from prior but still with erythema/warmth and low grade fevers. -Plan to follow-up with vascular for ultrasound -Recommended she call vascular given  concern for cellulitis and resume her doxycycline for now pending their recommendations -Continue compression stockings, feet elevation, lasix as prescribed  #Diastolic dysfunction: Patient with normal EF and G1DD on TTE. Suspect she was diagnosed with chronic diastolic HF in the setting of LE edema which is truly secondary to lymphedema. Per patient, she has never been hospitalized for HF or had pulmonary edema. Will continue to watch her blood pressure closely. -Monitor blood pressure at home  #Possible Hypertension: ECG today with sinus tachycardia, borderling LVH with strain pattern. Concern for possible masked HTN given BP normal in the office -Monitor blood pressure at home 3x/day for 5 days and send in blood pressure log  #DMII: Managed by PCP -Continue glipizide  #HLD: Managed by PCP. -Continue atorvastatin 10mg  daily   Medication Adjustments/Labs and Tests Ordered: Current medicines are reviewed at length with the patient today.  Concerns regarding medicines are outlined above.  Orders Placed This Encounter  Procedures  . EKG 12-Lead   No orders of the defined types were placed in this encounter.   Patient Instructions  Medication Instructions:  Your physician recommends that you continue on your current medications as directed. Please refer to the Current Medication list given to you today.  *If you need a refill on your cardiac medications before your next appointment, please call your pharmacy*   Lab Work: none If you have labs (blood work) drawn today and your tests are completely normal, you will receive your results only by: Marland Kitchen MyChart Message (if you have MyChart) OR . A paper copy in the mail If you have any lab test that is abnormal or we need to change your treatment, we will call you to review the results.   Testing/Procedures: none   Follow-Up: At [facility], you and your health needs are our priority.  As part of our continuing mission to  provide you with exceptional heart care, we have  created designated Provider Care Teams.  These Care Teams include your primary Cardiologist (physician) and Advanced Practice Providers (APPs -  Physician Assistants and Nurse Practitioners) who all work together to provide you with the care you need, when you need it.  We recommend signing up for the patient portal called "MyChart".  Sign up information is provided on this After Visit Summary.  MyChart is used to connect with patients for Virtual Visits (Telemedicine).  Patients are able to view lab/test results, encounter notes, upcoming appointments, etc.  Non-urgent messages can be sent to your provider as well.   To learn more about what you can do with MyChart, go to [email or web address].    Your next appointment:   3 month(s)  The format for your next appointment:   In Person  Provider:   You may see Freada Bergeron, MD or one of the following Advanced Practice Providers on your designated Care Team:    Richardson Dopp, PA-C  Vin [place], [place]    Other Instructions Please check your blood pressure at home for 5 days and call readings to the office. Please schedule an appointment to follow up with your vascular doctor      Signed, Freada Bergeron, MD  07/07/2020 5:12 PM    [place]

## 2020-07-07 ENCOUNTER — Other Ambulatory Visit: Payer: Self-pay

## 2020-07-07 ENCOUNTER — Ambulatory Visit (INDEPENDENT_AMBULATORY_CARE_PROVIDER_SITE_OTHER): Payer: Medicare PPO | Admitting: Cardiology

## 2020-07-07 ENCOUNTER — Encounter: Payer: Self-pay | Admitting: Cardiology

## 2020-07-07 VITALS — BP 116/62 | HR 117 | Ht 69.0 in | Wt 230.0 lb

## 2020-07-07 DIAGNOSIS — I5189 Other ill-defined heart diseases: Secondary | ICD-10-CM | POA: Diagnosis not present

## 2020-07-07 DIAGNOSIS — E7800 Pure hypercholesterolemia, unspecified: Secondary | ICD-10-CM | POA: Diagnosis not present

## 2020-07-07 DIAGNOSIS — I890 Lymphedema, not elsewhere classified: Secondary | ICD-10-CM

## 2020-07-07 NOTE — Patient Instructions (Addendum)
Medication Instructions:  Your physician recommends that you continue on your current medications as directed. Please refer to the Current Medication list given to you today.  *If you need a refill on your cardiac medications before your next appointment, please call your pharmacy*   Lab Work: none If you have labs (blood work) drawn today and your tests are completely normal, you will receive your results only by: Marland Kitchen MyChart Message (if you have MyChart) OR . A paper copy in the mail If you have any lab test that is abnormal or we need to change your treatment, we will call you to review the results.   Testing/Procedures: none   Follow-Up: At [facility], you and your health needs are our priority.  As part of our continuing mission to provide you with exceptional heart care, we have created designated Provider Care Teams.  These Care Teams include your primary Cardiologist (physician) and Advanced Practice Providers (APPs -  Physician Assistants and Nurse Practitioners) who all work together to provide you with the care you need, when you need it.  We recommend signing up for the patient portal called "MyChart".  Sign up information is provided on this After Visit Summary.  MyChart is used to connect with patients for Virtual Visits (Telemedicine).  Patients are able to view lab/test results, encounter notes, upcoming appointments, etc.  Non-urgent messages can be sent to your provider as well.   To learn more about what you can do with MyChart, go to [email or web address].    Your next appointment:   3 month(s)  The format for your next appointment:   In Person  Provider:   You may see Freada Bergeron, MD or one of the following Advanced Practice Providers on your designated Care Team:    Richardson Dopp, PA-C  [place], [place]    Other Instructions Please check your blood pressure at home for 5 days and call readings to the office. Please schedule an appointment to  follow up with your vascular doctor

## 2020-07-11 ENCOUNTER — Telehealth: Payer: Self-pay | Admitting: Cardiology

## 2020-07-11 NOTE — Telephone Encounter (Signed)
Spoke with the patient and advised her on what diastolic dysfunction is. Patient will continue to monitor her BP.

## 2020-07-11 NOTE — Telephone Encounter (Signed)
Patient states that Dr. Johney Frame told her she had diastolic dysfunction. She wants to know more about this and treatment. She is requesting to have the information sent to her house. Address on MyChart confirmed. Please advise.

## 2020-09-10 ENCOUNTER — Encounter (HOSPITAL_COMMUNITY): Payer: Self-pay | Admitting: Emergency Medicine

## 2020-09-10 ENCOUNTER — Ambulatory Visit (HOSPITAL_COMMUNITY)
Admission: EM | Admit: 2020-09-10 | Discharge: 2020-09-10 | Disposition: A | Discharge status: Home / Self Care | Payer: Medicare PPO | Attending: Family Medicine | Admitting: Family Medicine

## 2020-09-10 DIAGNOSIS — N3090 Cystitis, unspecified without hematuria: Secondary | ICD-10-CM | POA: Diagnosis not present

## 2020-09-10 DIAGNOSIS — K219 Gastro-esophageal reflux disease without esophagitis: Secondary | ICD-10-CM | POA: Diagnosis present

## 2020-09-10 LAB — POCT URINALYSIS DIPSTICK, ED / UC
Bilirubin Urine: NEGATIVE
Glucose, UA: NEGATIVE mg/dL
Ketones, ur: NEGATIVE mg/dL
Nitrite: NEGATIVE
Protein, ur: 30 mg/dL — AB
Specific Gravity, Urine: 1.015 (ref 1.005–1.030)
Urobilinogen, UA: 0.2 mg/dL (ref 0.0–1.0)
pH: 6 (ref 5.0–8.0)

## 2020-09-10 MED ORDER — OMEPRAZOLE 20 MG PO CPDR: 20.0000 mg | DELAYED_RELEASE_CAPSULE | Freq: Every day | ORAL | 0 refills | Status: DC

## 2020-09-10 MED ORDER — CEPHALEXIN 500 MG PO CAPS: 500.0000 mg | ORAL_CAPSULE | Freq: Two times a day (BID) | ORAL | 0 refills | Status: DC

## 2020-09-10 MED ORDER — RABEPRAZOLE SODIUM 20 MG PO TBEC: 20.0000 mg | DELAYED_RELEASE_TABLET | Freq: Every day | ORAL | 0 refills | Status: DC

## 2020-09-10 NOTE — ED Triage Notes (Signed)
PT C/O: UTI sx onset 1 week associated w/difficult voiding, dysuria and hematuria, abd pain  Also c/o heartburn x2 days  DENIES: fevers, diarrhea  TAKING MEDS:   A&O x4... NAD... Ambulatory

## 2020-09-10 NOTE — Discharge Instructions (Signed)
We have sent your urine sample for culture and will let you know if we need to change your antibiotic.

## 2020-09-12 LAB — URINE CULTURE: Culture: NO GROWTH

## 2020-09-13 NOTE — ED Provider Notes (Signed)
[facility]    ASSESSMENT & PLAN:  1. Cystitis   2. Gastroesophageal reflux disease without esophagitis      Meds ordered this encounter  Medications  . cephALEXin (KEFLEX) 500 MG capsule    Sig: Take 1 capsule (500 mg total) by mouth 2 (two) times daily.    Dispense:  10 capsule    Refill:  0  . RABEprazole (ACIPHEX) 20 MG tablet    Sig: Take 1 tablet (20 mg total) by mouth daily.    Dispense:  30 tablet    Refill:  0    No signs of pyelonephritis. Discussed. Urine culture sent. Will follow up with her PCP or here if not showing improvement over the next 48 hours, sooner if needed.  Outlined signs and symptoms indicating need for more acute intervention. Patient verbalized understanding. After Visit Summary given.  SUBJECTIVE:  Jessica Yates is a 70 y.o. female who complains of urinary frequency, urgency and dysuria for the past several days to week. Without associated flank pain, fever, chills, vaginal discharge or bleeding. Gross hematuria: not present. No specific aggravating or alleviating factors reported. No LE edema. Normal PO intake without n/v/d. Without specific abdominal pain. Ambulatory without difficulty. Also with increased heartburn recently. OTC treatment: none.  LMP: No LMP recorded. Patient has had a hysterectomy.   OBJECTIVE:  Vitals:   09/10/20 2018  BP: (!) 153/82  Pulse: (!) 56  Resp: 20  Temp: 98.5 F (36.9 C)  TempSrc: Oral  SpO2: 96%   General appearance: alert; no distress HENT: oropharynx: moist Lungs: unlabored respirations Abdomen: soft, non-tender; no masses or organomegaly; no guarding or rebound tenderness Back: no CVA tenderness Extremities: no edema; symmetrical with no gross deformities Skin: warm and dry Neurologic: normal gait Psychological: alert and cooperative; normal mood and affect  Labs Reviewed  POCT URINALYSIS DIPSTICK, ED / UC - Abnormal; Notable for the following components:      Result  Value   Hgb urine dipstick LARGE (*)    Protein, ur 30 (*)    Leukocytes,Ua SMALL (*)    All other components within normal limits  URINE CULTURE    No Known Allergies  Past Medical History:  Diagnosis Date  . Allergic rhinitis   . Anemia 2002  . Anxiety   . Arthritis   . Asthma   . Blood clot in vein   . Cellulitis   . Cellulitis of right lower extremity   . Chronic diastolic (congestive) heart failure (HCC)   . Chronic kidney disease, stage 3 (HCC)   . Coagulation defect (HCC)   . Cough   . Depression 02/06/2008  . Diabetes mellitus, type 2 (HCC)   . DVT (deep venous thrombosis) (HCC) 2002   left leg  . Dysphagia   . Elevated hemoglobin A1c   . GERD (gastroesophageal reflux disease)   . Glaucoma   . Holosystolic murmur   . Kidney cysts   . Leukocytosis   . Liver cyst   . Lymphedema   . Morbid obesity (HCC)   . Overactive bladder   . Palpitations   . Peripheral venous insufficiency   . Primary open angle glaucoma (POAG) of both eyes, mild stage   . Seasonal allergies   . Sickle cell trait (HCC)   . Tortuous aorta (HCC)   . Unstable gait   . Upper airway cough syndrome   . Vitamin D deficiency    Social History   Socioeconomic History  . Marital  status: Single    Spouse name: Not on file  . Number of children: 0  . Years of education: Not on file  . Highest education level: Not on file  Occupational History  . Occupation: retired from [employer]  Tobacco Use  . Smoking status: Never Smoker  . Smokeless tobacco: Never Used  Vaping Use  . Vaping Use: Never used  Substance and Sexual Activity  . Alcohol use: No  . Drug use: No  . Sexual activity: Never  Other Topics Concern  . Not on file  Social History Narrative   Lives with mother and brother         Social Determinants of Health   Financial Resource Strain: Not on file  Food Insecurity: Not on file  Transportation Needs: Not on file  Physical Activity: Not on file   Stress: Not on file  Social Connections: Not on file  Intimate Partner Violence: Not on file   Family History  Problem Relation Age of Onset  . Allergies Mother   . Asthma Mother   . Other Mother        enlarged heart  . Arthritis Mother   . Breast cancer Mother   . Diabetes Mother   . Hypertension Mother   . Cancer Mother        breast  . Hyperlipidemia Mother   . Heart disease Maternal Grandmother   . Breast cancer Maternal Grandmother   . Birth defects Maternal Grandmother        breast  . Cancer Father 52       leukemia  . Allergies Sister   . Arthritis Sister        Vanessa Kick, MD 09/13/20 [phone]

## 2020-09-17 ENCOUNTER — Ambulatory Visit (INDEPENDENT_AMBULATORY_CARE_PROVIDER_SITE_OTHER): Payer: Medicare PPO | Admitting: Podiatry

## 2020-09-17 ENCOUNTER — Other Ambulatory Visit: Payer: Self-pay

## 2020-09-17 ENCOUNTER — Encounter: Payer: Self-pay | Admitting: Podiatry

## 2020-09-17 DIAGNOSIS — N183 Chronic kidney disease, stage 3 unspecified: Secondary | ICD-10-CM

## 2020-09-17 DIAGNOSIS — M79674 Pain in right toe(s): Secondary | ICD-10-CM | POA: Diagnosis not present

## 2020-09-17 DIAGNOSIS — M79675 Pain in left toe(s): Secondary | ICD-10-CM

## 2020-09-17 DIAGNOSIS — B351 Tinea unguium: Secondary | ICD-10-CM

## 2020-09-17 DIAGNOSIS — I890 Lymphedema, not elsewhere classified: Secondary | ICD-10-CM

## 2020-09-18 NOTE — Progress Notes (Signed)
Subjective:  Patient ID: [name], female    DOB: 01-Jul-1951,  MRN: [id number]  70 y.o. female presents with painful thick toenails that are difficult to trim. Pain interferes with ambulation. Aggravating factors include wearing enclosed shoe gear. Pain is relieved with periodic professional debridement.   She states she has been treated for another episode of lower extremity cellulitis. She has had her legs wrapped and now uses her lymphedema pump at home.  Review of Systems: Negative except as noted in the HPI. Denies N/V/F/Ch.  Past Medical History:  Diagnosis Date  . Allergic rhinitis   . Anemia 2002  . Anxiety   . Arthritis   . Asthma   . Blood clot in vein   . Cellulitis   . Cellulitis of right lower extremity   . Chronic diastolic (congestive) heart failure ([place])   . Chronic kidney disease, stage 3 ([place])   . Coagulation defect ([place])   . Cough   . Depression 02/06/2008  . Diabetes mellitus, type 2 ([place])   . DVT (deep venous thrombosis) ([place]) 2002   left leg  . Dysphagia   . Elevated hemoglobin A1c   . GERD (gastroesophageal reflux disease)   . Glaucoma   . Holosystolic murmur   . Kidney cysts   . Leukocytosis   . Liver cyst   . Lymphedema   . Morbid obesity ([place])   . Overactive bladder   . Palpitations   . Peripheral venous insufficiency   . Primary open angle glaucoma (POAG) of both eyes, mild stage   . Seasonal allergies   . Sickle cell trait ([place])   . Tortuous aorta (HCC)   . Unstable gait   . Upper airway cough syndrome   . Vitamin D deficiency    Past Surgical History:  Procedure Laterality Date  . COSMETIC SURGERY  1975   nasal reconstruction  . TOTAL ABDOMINAL HYSTERECTOMY  07/2001   Patient Active Problem List   Diagnosis Date Noted  . Osteoarthritis of left knee 04/25/2020  . Cellulitis 05/13/2019  . Cellulitis of right lower extremity 05/12/2019  . CKD (chronic kidney disease) stage 3, GFR 30-59 ml/min (HCC) 05/12/2019  .  Normochromic normocytic anemia 05/12/2019  . Cellulitis, leg 05/12/2019  . Impacted cerumen of left ear 02/22/2019  . Laryngopharyngeal reflux (LPR) 02/22/2019  . 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  . Pain in left knee 11/13/2018  . Cellulitis and abscess of left leg 07/22/2018  . Primary open angle glaucoma of both eyes, mild stage 05/08/2018  . Cough 05/07/2016  . Upper airway cough syndrome 05/07/2016  . Sebaceous cyst of labia 04/19/2016  . OAB (overactive bladder) 04/19/2016  . 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 Lymphedema 05/03/2012  . CHF (congestive heart failure) ([place]) 04/16/2011  . SICKLE-CELL TRAIT 08/01/2007  . UNSPECIFIED GLAUCOMA 08/01/2007  . DVT 08/01/2007  . VENOUS INSUFFICIENCY 08/01/2007  . ALLERGIC RHINITIS 08/01/2007  . Extrinsic asthma 08/01/2007    Current Outpatient Medications:  .  acetaminophen (TYLENOL) 500 MG tablet, Take 1,000 mg by mouth every 6 (six) hours as needed for mild pain. , Disp: , Rfl:  .  albuterol (VENTOLIN HFA) 108 (90 Base) MCG/ACT inhaler, , Disp: , Rfl:  .  aspirin 81 MG tablet, Take 81 mg by mouth daily., Disp: , Rfl:  .  atorvastatin (LIPITOR) 10 MG tablet, , Disp: , Rfl:  .  blood glucose meter kit and supplies KIT, Dispense based on patient and insurance preference. Use  up to four times daily as directed. (FOR ICD-9 250.00, 250.01)., Disp: 1 each, Rfl: 0 .  cephALEXin (KEFLEX) 500 MG capsule, Take 1 capsule (500 mg total) by mouth 2 (two) times daily., Disp: 10 capsule, Rfl: 0 .  Cholecalciferol (VITAMIN D3) 25 MCG (1000 UT) CAPS, Take 1 capsule (1,000 Units total) by mouth daily., Disp: 30 capsule, Rfl: 0 .  clotrimazole (LOTRIMIN) 1 % cream, Apply to both feet and between toes twice daily for 4 weeks., Disp: 45 g, Rfl: 1 .  diclofenac Sodium (VOLTAREN) 1 % GEL, Apply 2-4 g topically 4 (four) times daily as needed (for pain)., Disp: , Rfl:  .  doxycycline (VIBRAMYCIN) 100 MG capsule, Take 1 capsule  (100 mg total) by mouth 2 (two) times daily., Disp: 20 capsule, Rfl: 0 .  ferrous sulfate 325 (65 FE) MG tablet, Take 325 mg by mouth 3 (three) times a week. , Disp: , Rfl:  .  fluticasone (FLONASE) 50 MCG/ACT nasal spray, Place 1-2 sprays into both nostrils daily as needed for allergies or rhinitis. , Disp: , Rfl:  .  furosemide (LASIX) 40 MG tablet, $RemoveBe'40mg'npaARbYmQ$  twice daily for 5 days, then $RemoveBe'40mg'ZJHRGyTfz$  once daily., Disp: 60 tablet, Rfl: 0 .  gabapentin (NEURONTIN) 100 MG capsule, Take 1 capsule (100 mg total) by mouth 2 (two) times daily., Disp: 30 capsule, Rfl: 0 .  glipiZIDE (GLUCOTROL) 10 MG tablet, Take 10 mg by mouth daily before breakfast. , Disp: , Rfl:  .  glipizide-metformin (METAGLIP) 2.5-250 MG tablet, Take by mouth., Disp: , Rfl:  .  hydrocerin (EUCERIN) CREA, Apply 1 application topically 2 (two) times daily., Disp: , Rfl: 0 .  ipratropium (ATROVENT) 0.03 % nasal spray, Place 2 sprays into the nose daily as needed for rhinitis. , Disp: , Rfl: 6 .  mirabegron ER (MYRBETRIQ) 25 MG TB24 tablet, Take 25 mg by mouth daily as needed (for urinary issues)., Disp: , Rfl:  .  omeprazole (PRILOSEC) 20 MG capsule, Take 1 capsule (20 mg total) by mouth daily., Disp: 30 capsule, Rfl: 0 .  potassium chloride SA (K-DUR,KLOR-CON) 20 MEQ tablet, Take 20 mEq by mouth daily. , Disp: , Rfl:  .  RABEprazole (ACIPHEX) 20 MG tablet, Take 1 tablet (20 mg total) by mouth daily., Disp: 30 tablet, Rfl: 0 .  traMADol (ULTRAM) 50 MG tablet, Take 50 mg by mouth daily as needed (for pain)., Disp: , Rfl:  .  TRAVATAN Z 0.004 % SOLN ophthalmic solution, Place 1 drop into both eyes at bedtime. , Disp: , Rfl:  .  triamcinolone cream (KENALOG) 0.1 %, Apply 1 application topically daily as needed (to affected, irritated sites). , Disp: , Rfl:  No Known Allergies Social History   Tobacco Use  Smoking Status Never Smoker  Smokeless Tobacco Never Used   Objective:   Constitutional She is a pleasant 70 y.o. African American female  in NAD. AAO x 3.   Vascular Dorsalis pedis pulses faintly palpable bilaterally. Posterior tibial pulses faintly palpable bilaterally. Capillary refill normal to all digits.  No cyanosis or clubbing noted. Pedal hair growth absent. Lymphedema noted b/l LE. Bilateral LE leg wraps in place which are clean, dry and intact. Skin temperature gradient WNL b/l.   Neurologic Normal speech. Oriented to person, place, and time. Epicritic sensation to light touch grossly present bilaterally. Protective sensation intact 5/5 b/l. Vibratory sensation intact b/l.   Dermatologic Toenails 1-5 b/l are painful, elongated, discolored, dystrophic with subungual debris. Pain with dorsal palpation of nailplates.  No erythema, no edema, no drainage noted. No open wounds. No skin lesions. Tinea pedis resolved b/l LE.  Orthopedic: Normal joint ROM without pain or crepitus bilaterally. [place] digits noted toes 1-5 b/l consistent with lymphedema. No bony tenderness.   Radiographs: None Assessment:   1. Pain due to onychomycosis of toenails of both feet   2. Lymphedema   3. Stage 3 chronic kidney disease, unspecified whether stage 3a or 3b CKD ([place])    Plan:  Patient was evaluated and treated and all questions answered.  Onychomycosis with pain -Nails palliatively debridement as below. -Educated on self-care Procedure: Nail Debridement Rationale: Pain Type of Debridement: manual, sharp debridement. Instrumentation: Nail nipper, rotary burr. Number of Nails: 10 -No new findings. No new orders. -Toenails 1-5 b/l were debrided in length and girth with sterile nail nippers and dremel without iatrogenic bleeding.  -Patient to continue soft, supportive shoe gear daily. -Patient/POA to call should there be question/concern in the interim.  Return in about 3 months (around 12/16/2020).  Marzetta Board, DPM

## 2020-09-19 ENCOUNTER — Other Ambulatory Visit: Payer: Medicare PPO

## 2020-10-05 IMAGING — US US EXTREM LOW*L* LIMITED
1 series · 14 of 20 positions shown · non-contrast
Comparison: None.

CLINICAL DATA: Medial left thigh pain.

EXAM:
ULTRASOUND LEFT LOWER EXTREMITY LIMITED
TECHNIQUE: Ultrasound examination of the lower extremity soft tissues was
performed in the area of clinical concern.

[Series 1: us extrem low*left* limited · 20 acquisitions, 14 frames shown]
[im 1/20]
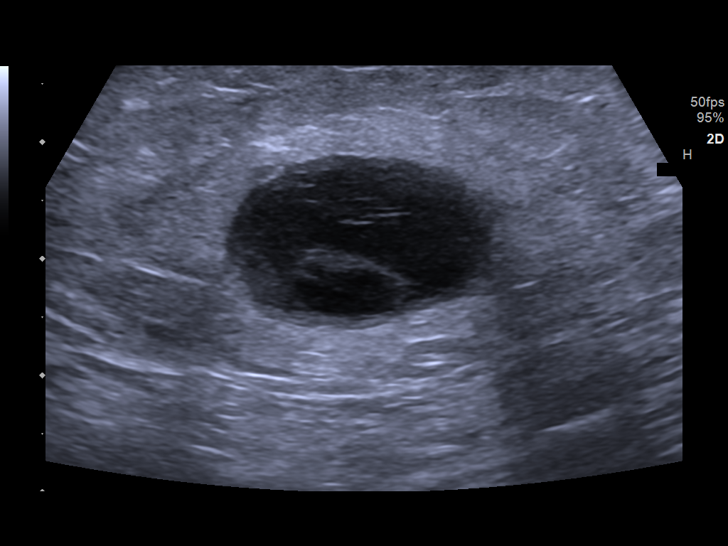
[im 3/20]
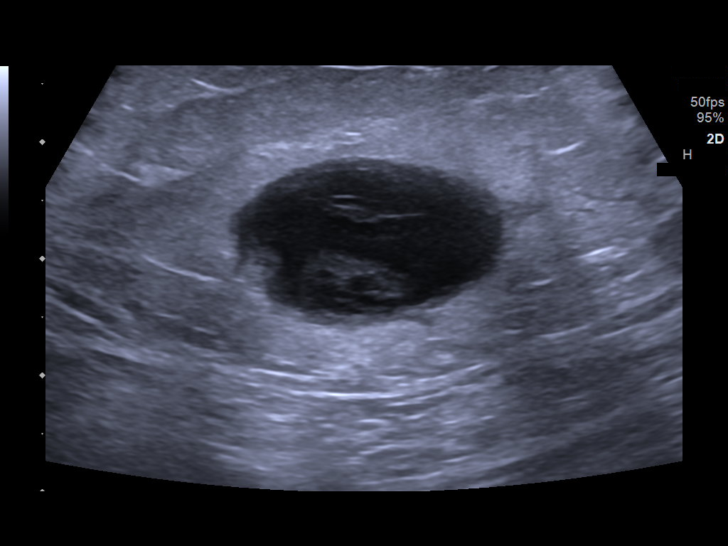
[im 4/20]
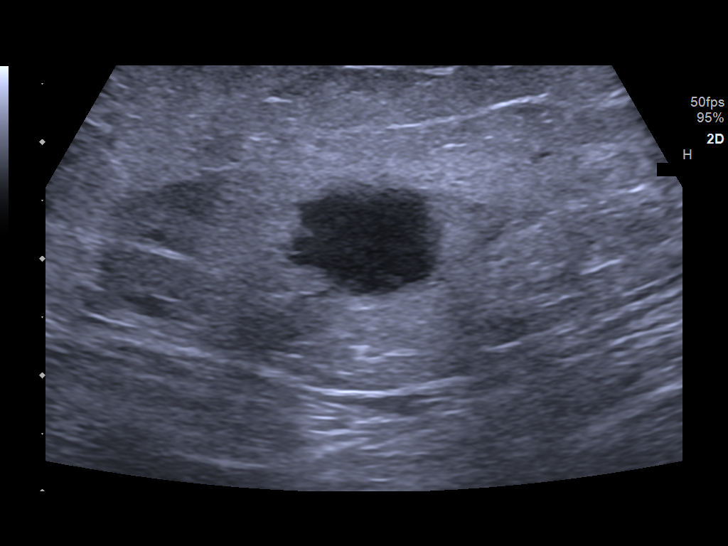
[im 6/20]
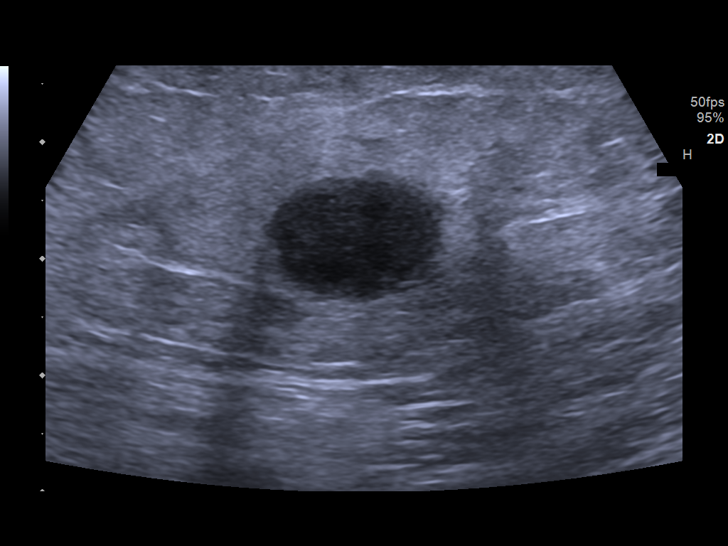
[im 7/20]
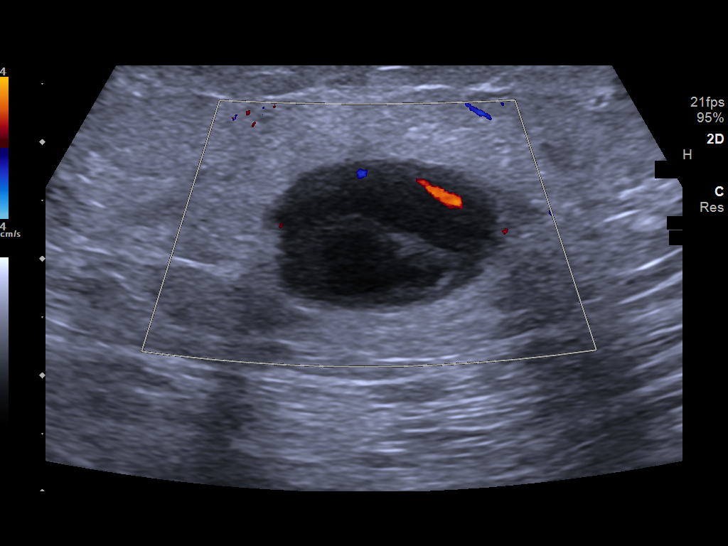
[im 8/20]
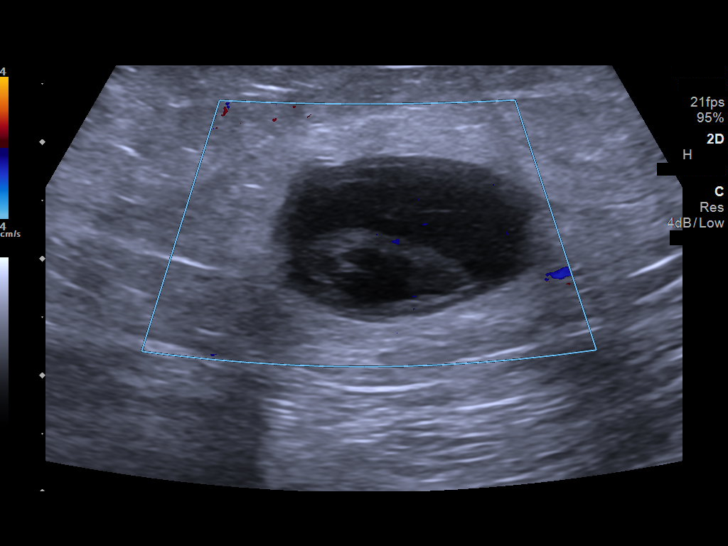
[im 10/20]
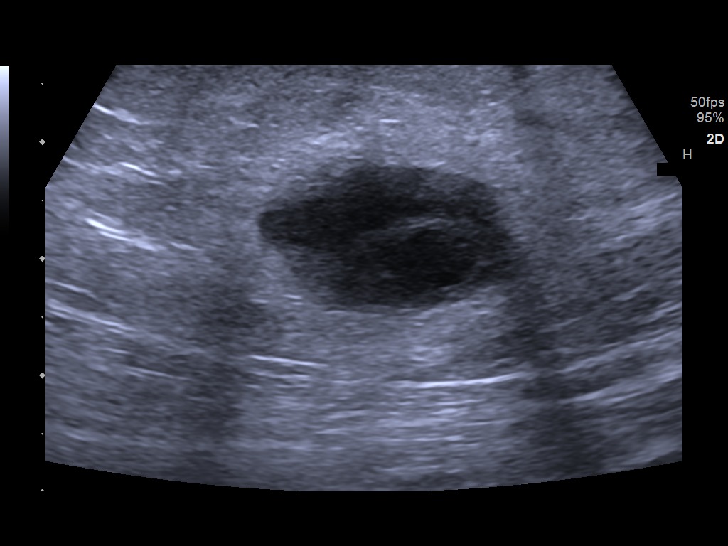
[im 11/20]
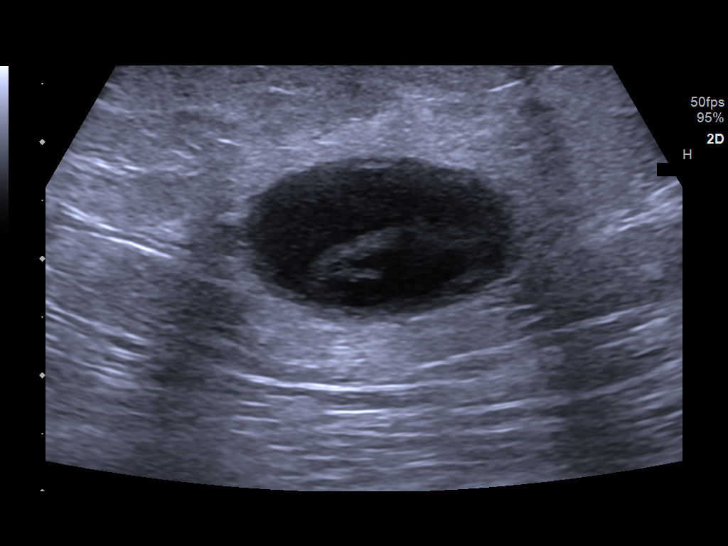
[im 13/20]
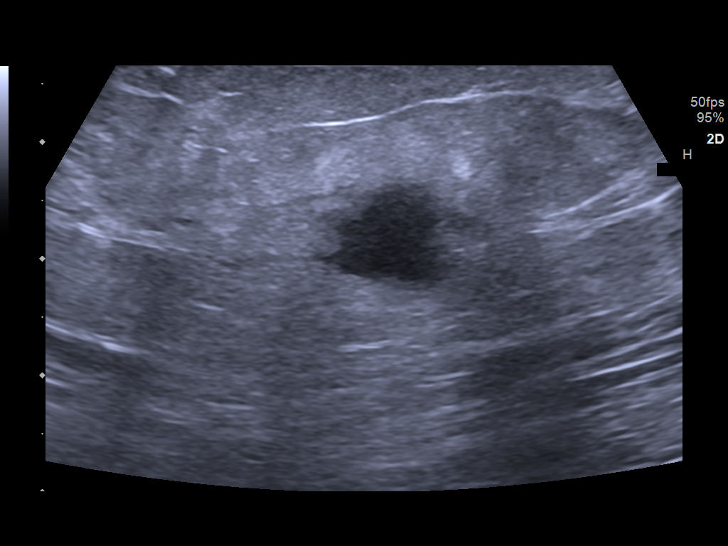
[im 14/20]
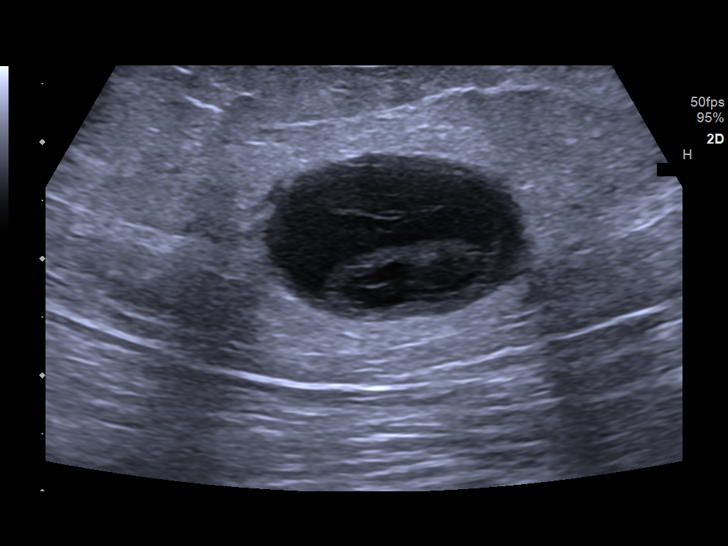
[im 16/20]
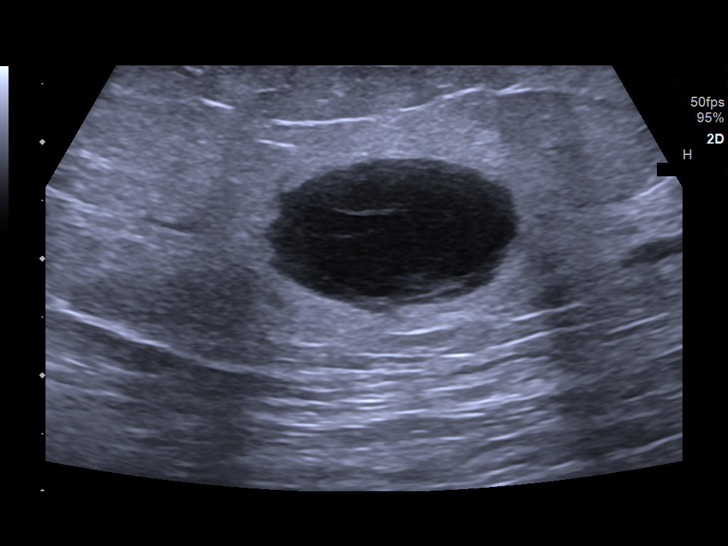
[im 17/20]
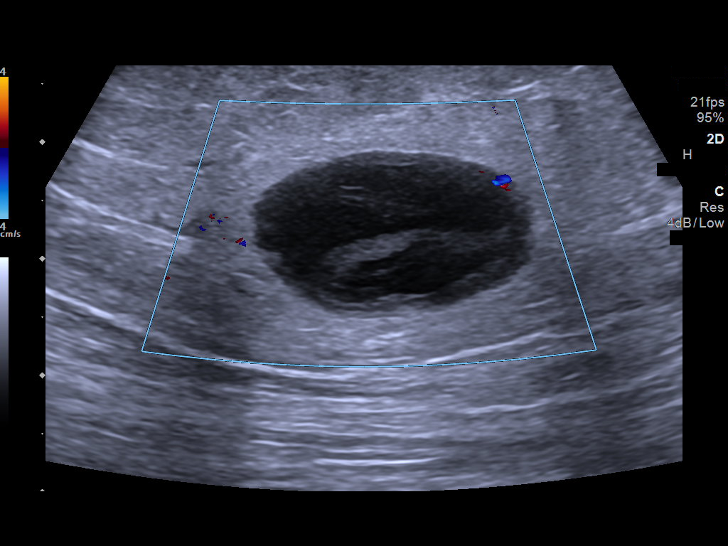
[im 18/20]
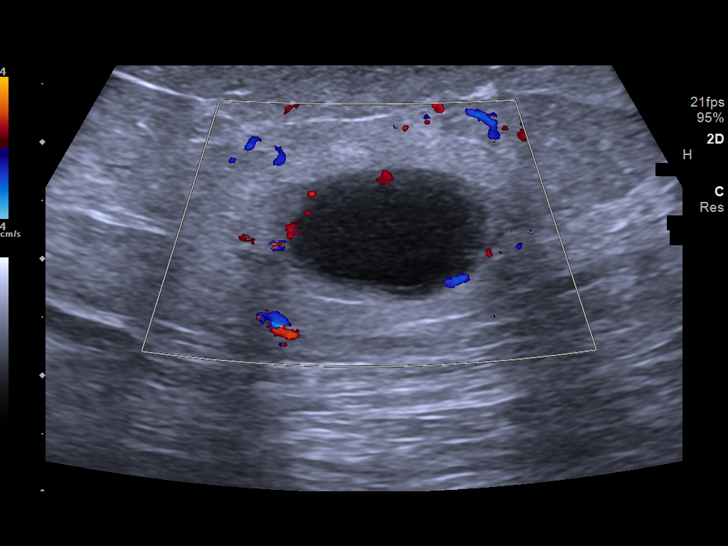
[im 20/20]
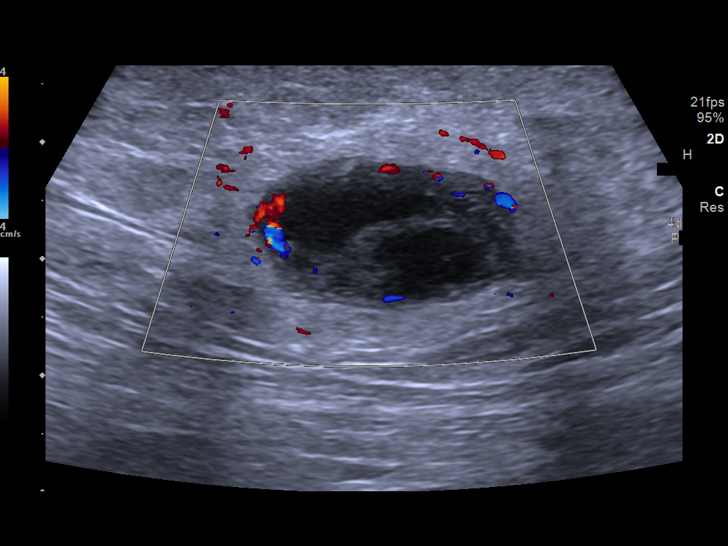

[14 of 20 positions shown; findings below may reference images not displayed]

FINDINGS: Scanning was directed toward the region of concern as indicated by
the patient. In the subcutaneous fatty tissues, a predominantly
hypoechoic lesion measuring 2.3 x 1.5 x 2.3 cm is identified. The
lesion has some septations and there is some flow within it on
Doppler imaging.
IMPRESSION: Cystic lesion in the region of concern is nonspecific and could
represent an abscess or neoplasm. It should be amenable to sampling.
Alternatively, MRI with and without contrast could be used for
further evaluation.

## 2020-10-08 NOTE — Progress Notes (Signed)
Cardiology Office Note:    Date:  10/13/2020   ID:  Jessica Yates, DOB [birth year], MRN [id number]  PCP:  Andree Moro, DO  [facility] Cardiologist:  Freada Bergeron, MD  [facility] Electrophysiologist:  None   Referring MD: Sonia Side., FNP    History of Present Illness:    Jessica Yates is a 70 y.o. female with a hx of asthma, diastolic dysfunction, chronic lymphedema, DMII, CKD stage 3, and DVT who presents to clinic for follow-up.  Patient has known history of chronic lymphedema (started at age 68) and has recurrent cellulitis managed by vascular. Has difficulty ambulating due to LE swelling and arthritis. Walks with cane, but no exertional symptoms. No known history of CAD. Was told she had "congestive heart failure," but is unsure why she was diagnosed with this. States she has never been hospitalized for fluid in her lungs, worsening SOB, or chest pain. Denies chest pain, SOB on exertion, palpitations, nausea or vomiting, abdominal pain. No orthopnea or PND. For her lymphedema, she does LE wrapping, compression stockings, LE elevation, and lasix.  Today, the patient overall doing well. Continues to follow with vascular for her lymphedema. No chest pain, SOB, nausea/vomiting, dizziness or lightheadedness. She is concerned about her diastolic function and if there is any medications she needed. Blood pressure is well controlled. She continues to take lasix for her lymphedema.  TTE in 2019 with normal BiV function, G1DD, no valvular abnormalities.    Past Medical History:  Diagnosis Date  . Allergic rhinitis   . Anemia 2002  . Anxiety   . Arthritis   . Asthma   . Blood clot in vein   . Cellulitis   . Cellulitis of right lower extremity   . Chronic diastolic (congestive) heart failure ([place])   . Chronic kidney disease, stage 3 ([place])   . Coagulation defect ([place])   . Cough   . Depression 02/06/2008  . Diabetes mellitus, type 2 ([place])   . DVT (deep  venous thrombosis) ([place]) 2002   left leg  . Dysphagia   . Elevated hemoglobin A1c   . GERD (gastroesophageal reflux disease)   . Glaucoma   . Holosystolic murmur   . Kidney cysts   . Leukocytosis   . Liver cyst   . Lymphedema   . Morbid obesity ([place])   . Overactive bladder   . Palpitations   . Peripheral venous insufficiency   . Primary open angle glaucoma (POAG) of both eyes, mild stage   . Seasonal allergies   . Sickle cell trait ([place])   . Tortuous aorta (HCC)   . Unstable gait   . Upper airway cough syndrome   . Vitamin D deficiency     Past Surgical History:  Procedure Laterality Date  . COSMETIC SURGERY  1975   nasal reconstruction  . TOTAL ABDOMINAL HYSTERECTOMY  07/2001    Current Medications: Current Meds  Medication Sig  . acetaminophen (TYLENOL) 500 MG tablet Take 1,000 mg by mouth every 6 (six) hours as needed for mild pain.   Marland Kitchen albuterol (VENTOLIN HFA) 108 (90 Base) MCG/ACT inhaler   . aspirin 81 MG tablet Take 81 mg by mouth daily.  Marland Kitchen atorvastatin (LIPITOR) 10 MG tablet   . blood glucose meter kit and supplies KIT Dispense based on patient and insurance preference. Use up to four times daily as directed. (FOR ICD-9 250.00, 250.01).  . Cholecalciferol (VITAMIN D3) 25 MCG (1000 UT) CAPS Take 1 capsule (  1,000 Units total) by mouth daily.  . ferrous sulfate 325 (65 FE) MG tablet Take 325 mg by mouth 3 (three) times a week.   . fluticasone (FLONASE) 50 MCG/ACT nasal spray Place 1-2 sprays into both nostrils daily as needed for allergies or rhinitis.   . furosemide (LASIX) 40 MG tablet $RemoveB'40mg'hKgrDAeu$  twice daily for 5 days, then $RemoveBe'40mg'RWaPVBjNu$  once daily.  Marland Kitchen glipizide-metformin (METAGLIP) 2.5-250 MG tablet Take by mouth.  . hydrocerin (EUCERIN) CREA Apply 1 application topically 2 (two) times daily.  Marland Kitchen omeprazole (PRILOSEC) 20 MG capsule Take 1 capsule (20 mg total) by mouth daily.  . potassium chloride SA (K-DUR,KLOR-CON) 20 MEQ tablet Take 20 mEq by mouth daily.   . TRAVATAN Z  0.004 % SOLN ophthalmic solution Place 1 drop into both eyes at bedtime.      Allergies:   Patient has no known allergies.   Social History   Socioeconomic History  . Marital status: Single    Spouse name: Not on file  . Number of children: 0  . Years of education: Not on file  . Highest education level: Not on file  Occupational History  . Occupation: retired from [employer]  Tobacco Use  . Smoking status: Never Smoker  . Smokeless tobacco: Never Used  Vaping Use  . Vaping Use: Never used  Substance and Sexual Activity  . Alcohol use: No  . Drug use: No  . Sexual activity: Never  Other Topics Concern  . Not on file  Social History Narrative   Lives with mother and brother         Social Determinants of Health   Financial Resource Strain: Not on file  Food Insecurity: Not on file  Transportation Needs: Not on file  Physical Activity: Not on file  Stress: Not on file  Social Connections: Not on file     Family History: The patient's family history includes Allergies in her mother and sister; Arthritis in her mother and sister; Asthma in her mother; Birth defects in her maternal grandmother; Breast cancer in her maternal grandmother and mother; Cancer in her mother; Cancer (age of onset: 8) in her father; Diabetes in her mother; Heart disease in her maternal grandmother; Hyperlipidemia in her mother; Hypertension in her mother; Other in her mother.  ROS:   Please see the history of present illness.    Review of Systems  Constitutional: Negative for chills and fever.  HENT: Negative for congestion.   Eyes: Negative for blurred vision.  Respiratory: Negative for shortness of breath.   Cardiovascular: Positive for leg swelling. Negative for chest pain, palpitations, orthopnea, claudication and PND.  Gastrointestinal: Negative for nausea and vomiting.  Genitourinary: Negative for hematuria.  Musculoskeletal: Negative for falls.  Neurological:  Negative for dizziness and loss of consciousness.  Endo/Heme/Allergies: Negative for polydipsia.  Psychiatric/Behavioral: Negative for substance abuse.    EKGs/Labs/Other Studies Reviewed:    The following studies were reviewed today: 07/26/18: Study Conclusions  - Left ventricle: The cavity size was normal. Wall thickness was  normal. Systolic function was normal. The estimated ejection  fraction was in the range of 55% to 60%. Wall motion was normal;  there were no regional wall motion abnormalities. Doppler  parameters are consistent with abnormal left ventricular  relaxation (grade 1 diastolic dysfunction). The E/e&' ratio is  between 8-15, suggesting inderminate LV filling pressure.  - Mitral valve: Mildly thickened leaflets . There was trivial  regurgitation.  - Left atrium: The atrium was normal  in size.  - Inferior vena cava: The vessel was normal in size. The  respirophasic diameter changes were in the normal range (>= 50%),  consistent with normal central venous pressure.   Impressions:   - LVEF 55-60%, normal wall thickness, normal wall motion, grade 1  DD, indeterminate LV filling pressure, trivial MR, normal LA  size, normal IVC.  LE doppler ultrasound 08/06/19: Summary:  Right: There is no evidence of deep vein thrombosis in the lower  extremity. No cystic structure found in the popliteal fossa.  Left: No evidence of common femoral vein obstruction.  CTA 12/28/15: FINDINGS: There are no filling defects within the pulmonary arteries to suggest pulmonary embolus.  Mild multi chamber cardiomegaly. Thoracic aorta is normal in caliber, tortuous in its course. No dissection or aneurysm. Conventional branching pattern from the aortic arch. No mediastinal or hilar adenopathy. No pericardial effusion.  No pleural effusion. Dependent linear atelectasis in both lower lobes. Mild biapical pleural parenchymal scarring. No  consolidation, pulmonary mass or suspicious nodule.  No acute abnormality in the included upper abdomen.  There are no acute or suspicious osseous abnormalities. Unchanged exaggerated thoracic kyphosis with associated degenerative change.  Review of the MIP images confirms the above findings.  IMPRESSION: 1. No pulmonary embolus. 2. Mild multi chamber cardiomegaly without pulmonary edema.  Recent Labs: No results found for requested labs within last 8760 hours.  Recent Lipid Panel No results found for: CHOL, TRIG, HDL, CHOLHDL, VLDL, LDLCALC, LDLDIRECT   Physical Exam:    VS:  BP 120/60   Pulse (!) 108 Comment: recheck pulse 106  Ht 5\' 7"  (1.702 m)   Wt 228 lb 3.2 oz (103.5 kg)   SpO2 95%   BMI 35.74 kg/m     Wt Readings from Last 3 Encounters:  10/13/20 228 lb 3.2 oz (103.5 kg)  07/07/20 230 lb (104.3 kg)  09/26/19 225 lb (102.1 kg)     GEN:  Well nourished, well developed in no acute distress HEENT: Normal NECK: No JVD; No carotid bruits LYMPHATICS: Profound LE lymphedema CARDIAC: RRR, 1/6 systolic murmur. No rubs or gallops RESPIRATORY:  Clear to auscultation without rales, wheezing or rhonchi  ABDOMEN: Soft, non-tender, non-distended MUSCULOSKELETAL:  Significant lymphedema with L>>R (chronic) SKIN: Warm and dry NEUROLOGIC:  Alert and oriented x 3 PSYCHIATRIC:  Normal affect   ASSESSMENT:    1. Diastolic dysfunction   2. Lymphedema   3. Pure hypercholesterolemia   4. Type 2 diabetes mellitus without complication, without long-term current use of insulin (HCC)    PLAN:    In order of problems listed above:  #Chronic Lymphedema #Chronic Venous Insufficiency #Cellulitis: Diagnosed at age 65. Patient with severe, bilateral LE with L>>R. Followed by vascular and currently using compression stockings, lasix, feet elevation with plans for pneumatic compression devices. Has had complications with cellulitis in the past. -Continue follow-up with vascular  as scheduled -Continue compression stockings, feet elevation, lasix as prescribed -Will check BMET to ensure renal function stable  #Diastolic dysfunction: Patient with normal EF and G1DD on TTE. Suspect she was diagnosed with chronic diastolic HF in the setting of LE edema which is truly secondary to lymphedema. Per patient, she has never been hospitalized for HF or had pulmonary edema. Will continue to watch her blood pressure closely, but has been wall controlled. Will continue to monitor for symptoms -Monitor blood pressure at home  #DMII: Managed by PCP -Continue glipizide  #HLD: Managed by PCP. -Continue atorvastatin 10mg  daily -Possible coronary calcium score  at next visit for risk stratification   Medication Adjustments/Labs and Tests Ordered: Current medicines are reviewed at length with the patient today.  Concerns regarding medicines are outlined above.  Orders Placed This Encounter  Procedures  . Basic metabolic panel   No orders of the defined types were placed in this encounter.   Patient Instructions  Medication Instructions:  Your provider recommends that you continue on your current medications as directed. Please refer to the Current Medication list given to you today.   *If you need a refill on your cardiac medications before your next appointment, please call your pharmacy*  Lab Work: TODAY! BMET If you have labs (blood work) drawn today and your tests are completely normal, you will receive your results only by: Marland Kitchen MyChart Message (if you have MyChart) OR . A paper copy in the mail If you have any lab test that is abnormal or we need to change your treatment, we will call you to review the results.  Follow-Up: At [facility], you and your health needs are our priority.  As part of our continuing mission to provide you with exceptional heart care, we have created designated Provider Care Teams.  These Care Teams include your primary Cardiologist  (physician) and Advanced Practice Providers (APPs -  Physician Assistants and Nurse Practitioners) who all work together to provide you with the care you need, when you need it. Your next appointment:   6 month(s) The format for your next appointment:   In Person Provider:   You may see Freada Bergeron, MD or one of the following Advanced Practice Providers on your designated Care Team:    Richardson Dopp, PA-C  Robbie Lis, [place]       Signed, Freada Bergeron, MD  10/13/2020 4:49 PM    [place]

## 2020-10-13 ENCOUNTER — Other Ambulatory Visit: Payer: Self-pay

## 2020-10-13 ENCOUNTER — Ambulatory Visit (INDEPENDENT_AMBULATORY_CARE_PROVIDER_SITE_OTHER): Payer: Medicare PPO | Admitting: Cardiology

## 2020-10-13 ENCOUNTER — Encounter: Payer: Self-pay | Admitting: Cardiology

## 2020-10-13 VITALS — BP 120/60 | HR 108 | Ht 67.0 in | Wt 228.2 lb

## 2020-10-13 DIAGNOSIS — E119 Type 2 diabetes mellitus without complications: Secondary | ICD-10-CM | POA: Diagnosis not present

## 2020-10-13 DIAGNOSIS — I890 Lymphedema, not elsewhere classified: Secondary | ICD-10-CM

## 2020-10-13 DIAGNOSIS — E7800 Pure hypercholesterolemia, unspecified: Secondary | ICD-10-CM | POA: Diagnosis not present

## 2020-10-13 DIAGNOSIS — I5189 Other ill-defined heart diseases: Secondary | ICD-10-CM | POA: Diagnosis not present

## 2020-10-13 NOTE — Patient Instructions (Addendum)
Medication Instructions:  Your provider recommends that you continue on your current medications as directed. Please refer to the Current Medication list given to you today.   *If you need a refill on your cardiac medications before your next appointment, please call your pharmacy*  Lab Work: TODAY! BMET If you have labs (blood work) drawn today and your tests are completely normal, you will receive your results only by: Marland Kitchen MyChart Message (if you have MyChart) OR . A paper copy in the mail If you have any lab test that is abnormal or we need to change your treatment, we will call you to review the results.  Follow-Up: At [facility], you and your health needs are our priority.  As part of our continuing mission to provide you with exceptional heart care, we have created designated Provider Care Teams.  These Care Teams include your primary Cardiologist (physician) and Advanced Practice Providers (APPs -  Physician Assistants and Nurse Practitioners) who all work together to provide you with the care you need, when you need it. Your next appointment:   6 month(s) The format for your next appointment:   In Person Provider:   You may see Freada Bergeron, MD or one of the following Advanced Practice Providers on your designated Care Team:    Richardson Dopp, PA-C  [place], [place]

## 2020-10-14 LAB — BASIC METABOLIC PANEL
BUN/Creatinine Ratio: 15 (ref 12–28)
BUN: 15 mg/dL (ref 8–27)
CO2: 23 mmol/L (ref 20–29)
Calcium: 10.3 mg/dL (ref 8.7–10.3)
Chloride: 101 mmol/L (ref 96–106)
Creatinine, Ser: 1 mg/dL (ref 0.57–1.00)
GFR calc Af Amer: 66 mL/min/{1.73_m2} (ref >59)
GFR calc non Af Amer: 57 mL/min/{1.73_m2} — ABNORMAL LOW (ref >59)
Glucose: 145 mg/dL — ABNORMAL HIGH (ref 65–99)
Potassium: 3.7 mmol/L (ref 3.5–5.2)
Sodium: 137 mmol/L (ref 134–144)

## 2020-10-15 ENCOUNTER — Telehealth: Payer: Self-pay

## 2020-10-15 NOTE — Telephone Encounter (Signed)
-----   Message from Freada Bergeron, MD sent at 10/14/2020  7:13 PM EST ----- Her electrolytes and kidney function are stable and look great. No changes in medication. She should continue the same dose of her daily lasix for her lymphedema.

## 2020-10-15 NOTE — Telephone Encounter (Signed)
Attempted to call the patient with recent lab results, no answer and unable to leave a message due to mailbox not being set up yet.

## 2020-12-10 ENCOUNTER — Other Ambulatory Visit: Payer: Self-pay | Admitting: Nephrology

## 2020-12-10 DIAGNOSIS — N1831 Chronic kidney disease, stage 3a: Secondary | ICD-10-CM

## 2020-12-17 ENCOUNTER — Ambulatory Visit: Payer: Medicare PPO | Admitting: Podiatry

## 2021-01-05 ENCOUNTER — Ambulatory Visit: Payer: Medicare PPO | Admitting: Gastroenterology

## 2021-01-21 ENCOUNTER — Other Ambulatory Visit: Payer: Self-pay

## 2021-01-21 ENCOUNTER — Ambulatory Visit (INDEPENDENT_AMBULATORY_CARE_PROVIDER_SITE_OTHER): Payer: Medicare PPO | Admitting: Physician Assistant

## 2021-01-21 ENCOUNTER — Encounter: Payer: Self-pay | Admitting: Physician Assistant

## 2021-01-21 VITALS — BP 120/68 | HR 111 | Ht 69.0 in | Wt 225.0 lb

## 2021-01-21 DIAGNOSIS — Z1211 Encounter for screening for malignant neoplasm of colon: Secondary | ICD-10-CM | POA: Diagnosis not present

## 2021-01-21 DIAGNOSIS — K219 Gastro-esophageal reflux disease without esophagitis: Secondary | ICD-10-CM

## 2021-01-21 DIAGNOSIS — R059 Cough, unspecified: Secondary | ICD-10-CM | POA: Diagnosis not present

## 2021-01-21 MED ORDER — NA SULFATE-K SULFATE-MG SULF 17.5-3.13-1.6 GM/177ML PO SOLN: 1.0000 | Freq: Once | ORAL | 0 refills | Status: AC

## 2021-01-21 MED ORDER — RABEPRAZOLE SODIUM 20 MG PO TBEC: 20.0000 mg | DELAYED_RELEASE_TABLET | Freq: Every day | ORAL | 11 refills | Status: DC

## 2021-01-21 MED ORDER — FAMOTIDINE 40 MG PO TABS: 40.0000 mg | ORAL_TABLET | Freq: Every day | ORAL | 11 refills | Status: DC

## 2021-01-21 NOTE — Patient Instructions (Addendum)
If you are age 70 or older, your body mass index should be between 23-30. Your Body mass index is 33.23 kg/m. If this is out of the aforementioned range listed, please consider follow up with your Primary Care Provider.  If you are age 34 or younger, your body mass index should be between 19-25. Your Body mass index is 33.23 kg/m. If this is out of the aformentioned range listed, please consider follow up with your Primary Care Provider.   You have been scheduled for a colonoscopy. Please follow written instructions given to you at your visit today.  Please pick up your prep supplies at the pharmacy within the next 1-3 days. If you use inhalers (even only as needed), please bring them with you on the day of your procedure.  Start Rabeprazole 20 mg 1 tablet daily at breakfast Start Famotidine 40 mg 1 tablet before dinner  You will be contacted by [facility] in the next 2 days to arrange a Barium Swallow.  The number on your caller ID will be [phone], please answer when they call.  If you have not heard from them in 2 days please call [phone] to schedule.     Follow an Anti-Reflux regimen and diet.  Follow up as needed.  Thank you for entrusting me with your care and choosing [facility].  Amy Esterwood, PA-C

## 2021-01-21 NOTE — Progress Notes (Signed)
 Subjective:    Patient ID: Jessica Yates, female    DOB: 01/21/1951, 70 y.o.   MRN: [id number]  HPI Jessica Yates is a 70-year-old African-American female, established with Jessica Yates.  She was last seen here in April 2020. She comes in today with complaints of sore throat and cough and what is felt to be possible LPR. She says that she was told to come to GI by her prior PCP Jessica Yates/[facility]. Patient has not had prior EGD, she did have colonoscopy here in October 2011 which was normal exam per Jessica Yates. She has history of prior DVT, congestive heart failure, osteoarthritis, overactive bladder, chronic kidney disease stage III. She has been on Aciphex 20 mg p.o. every morning for at least a year.  She has no current complaints of heartburn or indigestion denies any dysphagia or odynophagia.  Over the past several months she has had frequent coughing episodes seem to be worse early in the morning hours, she is not waking from sleep with cough or choking.  She does not particularly think that eating triggers her cough.  She describes it as a constant irritation and tickle in her throat.  No sense of fullness or lump,.  She does feel that sometimes drinking liquids will trigger cough.  She does not feel that she has excessive sinus drainage. She is not having any current lower GI complaints, no melena or hematochezia.  No complaints of abdominal pain.  She does ask about some urgency for bowel movements prior to going out of her house.  She is wondering if this is psychosomatic.  Review of Systems Pertinent positive and negative review of systems were noted in the above HPI section.  All other review of systems was otherwise negative.  Outpatient Encounter Medications as of 01/21/2021  Medication Sig  . acetaminophen (TYLENOL) 500 MG tablet Take 1,000 mg by mouth every 6 (six) hours as needed for mild pain.   . albuterol (VENTOLIN HFA) 108 (90 Base) MCG/ACT inhaler   . alum & mag  hydroxide-simeth (MAALOX/MYLANTA) 200-200-20 MG/5ML suspension Take 15 mLs by mouth every 6 (six) hours as needed for indigestion or heartburn.  . aspirin 81 MG tablet Take 81 mg by mouth daily.  . atorvastatin (LIPITOR) 10 MG tablet   . blood glucose meter kit and supplies KIT Dispense based on patient and insurance preference. Use up to four times daily as directed. (FOR ICD-9 250.00, 250.01).  . Cholecalciferol (VITAMIN D3) 25 MCG (1000 UT) CAPS Take 1 capsule (1,000 Units total) by mouth daily.  . famotidine (PEPCID) 40 MG tablet Take 1 tablet (40 mg total) by mouth at bedtime.  . ferrous sulfate 325 (65 FE) MG tablet Take 325 mg by mouth 3 (three) times a week.   . fluticasone (FLONASE) 50 MCG/ACT nasal spray Place 1-2 sprays into both nostrils daily as needed for allergies or rhinitis.   . furosemide (LASIX) 40 MG tablet 40mg twice daily for 5 days, then 40mg once daily.  . glipizide-metformin (METAGLIP) 2.5-250 MG tablet Take by mouth.  . Na Sulfate-K Sulfate-Mg Sulf 17.5-3.13-1.6 GM/177ML SOLN Take 1 kit by mouth once for 1 dose.  . omeprazole (PRILOSEC) 20 MG capsule Take 1 capsule (20 mg total) by mouth daily.  . potassium chloride SA (K-DUR,KLOR-CON) 20 MEQ tablet Take 20 mEq by mouth daily.   . RABEprazole (ACIPHEX) 20 MG tablet Take 1 tablet (20 mg total) by mouth daily.  . TRAVATAN Z 0.004 % SOLN ophthalmic solution   Place 1 drop into both eyes at bedtime.   . triamcinolone (KENALOG) 0.025 % cream Apply 1 application topically 2 (two) times daily.  . Vitamin D, Ergocalciferol, (DRISDOL) 1.25 MG (50000 UNIT) CAPS capsule Take 50,000 Units by mouth every 7 (seven) days.  . [DISCONTINUED] hydrocerin (EUCERIN) CREA Apply 1 application topically 2 (two) times daily.  . [DISCONTINUED] metFORMIN (GLUCOPHAGE) 500 MG tablet Take 1 tablet (500 mg total) by mouth 2 (two) times daily with a meal. (Patient not taking: Reported on 06/14/2019)   No facility-administered encounter medications on file  as of 01/21/2021.   No Known Allergies Patient Active Problem List   Diagnosis Date Noted  . Osteoarthritis of left knee 04/25/2020  . Cellulitis 05/13/2019  . Cellulitis of right lower extremity 05/12/2019  . CKD (chronic kidney disease) stage 3, GFR 30-59 ml/min (HCC) 05/12/2019  . Normochromic normocytic anemia 05/12/2019  . Cellulitis, leg 05/12/2019  . Impacted cerumen of left ear 02/22/2019  . Laryngopharyngeal reflux (LPR) 02/22/2019  . 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  . Pain in left knee 11/13/2018  . Cellulitis and abscess of left leg 07/22/2018  . Primary open angle glaucoma of both eyes, mild stage 05/08/2018  . Cough 05/07/2016  . Upper airway cough syndrome 05/07/2016  . Sebaceous cyst of labia 04/19/2016  . OAB (overactive bladder) 04/19/2016  . 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 Lymphedema 05/03/2012  . CHF (congestive heart failure) (HCC) 04/16/2011  . SICKLE-CELL TRAIT 08/01/2007  . UNSPECIFIED GLAUCOMA 08/01/2007  . DVT 08/01/2007  . VENOUS INSUFFICIENCY 08/01/2007  . ALLERGIC RHINITIS 08/01/2007  . Extrinsic asthma 08/01/2007   Social History   Socioeconomic History  . Marital status: Single    Spouse name: Not on file  . Number of children: 0  . Years of education: Not on file  . Highest education level: Not on file  Occupational History  . Occupation: retired from [employer]  Tobacco Use  . Smoking status: Never Smoker  . Smokeless tobacco: Never Used  Vaping Use  . Vaping Use: Never used  Substance and Sexual Activity  . Alcohol use: No  . Drug use: No  . Sexual activity: Never  Other Topics Concern  . Not on file  Social History Narrative   Lives with mother and brother         Social Determinants of Health   Financial Resource Strain: Not on file  Food Insecurity: Not on file  Transportation Needs: Not on file  Physical Activity: Not on file  Stress: Not on file  Social  Connections: Not on file  Intimate Partner Violence: Not on file    Jessica Yates's family history includes Allergies in her mother and sister; Arthritis in her mother and sister; Asthma in her mother; Birth defects in her maternal grandmother; Breast cancer in her maternal grandmother and mother; Cancer in her mother; Cancer (age of onset: 50) in her father; Diabetes in her mother; Heart disease in her maternal grandmother; Hyperlipidemia in her mother; Hypertension in her mother; Other in her mother.      Objective:    Vitals:   01/21/21 1538  BP: 120/68  Pulse: (!) 111    Physical Exam Well-developed well-nourished older African-American female in no acute distress.  Height, Weight, 225 BMI 33.2, talkative HEENT; nontraumatic normocephalic, EOMI, PE R LA, sclera anicteric. Oropharynx; no oral thrush, Neck; supple, no JVD no thyromegaly no palpable adenopathy Cardiovascular; regular rate and rhythm with S1-S2, no   murmur rub or gallop Pulmonary; Clear bilaterally Abdomen; soft, nontender, nondistended, no palpable mass or hepatosplenomegaly, bowel sounds are active Rectal; not done today Skin; benign exam, no jaundice rash or appreciable lesions Extremities; no clubbing cyanosis or edema skin warm and dry Neuro/Psych; alert and oriented x4, grossly nonfocal mood and affect appropriate       Assessment & Plan:   #1 70-year-old African-American female with several month history of cough, and throat irritation, frequent coughing spells early in the morning. No ongoing heartburn or indigestion no dysphagia or odynophagia, no awareness of sour brash. I am not certain that her cough is secondary to LPR though certainly possible.  #2  colon cancer screening-last colonoscopy October 2011.-This was a normal exam, no family history  #3 congestive heart failure EF of 55% 4.  Osteoarthritis 5.  Chronic kidney disease stage III 6.  Overactive bladder 7.  Prior DVT  Plan; patient will  be scheduled for a barium swallow with tablet Strict antireflux regimen including antireflux diet and precautions including n.p.o. for 2 to 3 hours prior to bedtime upright positioning postprandially for at least an hour Continue Aciphex 20 mg p.o. every morning AC breakfast Add Pepcid 40 mg AC dinner, prescription sent  Patient can try warm salt water gargles for throat irritation.  She was asking for cough medicine. We discussed colon cancer screening, discussed option of Cologuard as she is average risk and has not had any prior polyps.,  After long discussion she has decided to proceed with colonoscopy.  This will be scheduled with Jessica Yates.  Procedure was discussed in detail with the patient including indications risk and benefits and she is agreeable to proceed.  Further recommendations pending results of barium swallow.  Amy S Esterwood PA-C 01/21/2021   Cc: Yates, Nilay, DO  

## 2021-01-22 NOTE — Progress Notes (Signed)
I agree with the above note, plan 

## 2021-02-04 ENCOUNTER — Ambulatory Visit (HOSPITAL_COMMUNITY)
Admission: RE | Admit: 2021-02-04 | Discharge: 2021-02-04 | Disposition: A | Discharge status: Home / Self Care | Payer: Medicare PPO | Source: Ambulatory Visit | Attending: Physician Assistant | Admitting: Physician Assistant

## 2021-02-04 ENCOUNTER — Other Ambulatory Visit: Payer: Self-pay

## 2021-02-04 DIAGNOSIS — R059 Cough, unspecified: Secondary | ICD-10-CM | POA: Diagnosis present

## 2021-02-04 DIAGNOSIS — K219 Gastro-esophageal reflux disease without esophagitis: Secondary | ICD-10-CM | POA: Insufficient documentation

## 2021-02-05 ENCOUNTER — Ambulatory Visit: Payer: Medicare PPO | Admitting: Sports Medicine

## 2021-02-12 ENCOUNTER — Other Ambulatory Visit: Payer: Self-pay | Admitting: Nephrology

## 2021-02-12 DIAGNOSIS — N1831 Chronic kidney disease, stage 3a: Secondary | ICD-10-CM

## 2021-02-17 ENCOUNTER — Other Ambulatory Visit: Payer: Medicare PPO

## 2021-02-19 ENCOUNTER — Other Ambulatory Visit: Payer: Self-pay

## 2021-02-19 ENCOUNTER — Ambulatory Visit (INDEPENDENT_AMBULATORY_CARE_PROVIDER_SITE_OTHER): Payer: Medicare PPO | Admitting: Sports Medicine

## 2021-02-19 ENCOUNTER — Encounter: Payer: Self-pay | Admitting: Sports Medicine

## 2021-02-19 DIAGNOSIS — M79675 Pain in left toe(s): Secondary | ICD-10-CM

## 2021-02-19 DIAGNOSIS — B353 Tinea pedis: Secondary | ICD-10-CM

## 2021-02-19 DIAGNOSIS — M79674 Pain in right toe(s): Secondary | ICD-10-CM | POA: Diagnosis not present

## 2021-02-19 DIAGNOSIS — I890 Lymphedema, not elsewhere classified: Secondary | ICD-10-CM

## 2021-02-19 DIAGNOSIS — B351 Tinea unguium: Secondary | ICD-10-CM | POA: Diagnosis not present

## 2021-02-19 DIAGNOSIS — N183 Chronic kidney disease, stage 3 unspecified: Secondary | ICD-10-CM

## 2021-02-19 NOTE — Patient Instructions (Signed)
Tinactin or Lamisil spray with cooling effect

## 2021-02-19 NOTE — Progress Notes (Signed)
Subjective: Jessica Yates is a 70 y.o. female patient seen today in office with complaint of mildly painful thickened and elongated toenails; unable to trim. Patient has a history of lymphedema. No other issues noted.   Patient Active Problem List   Diagnosis Date Noted   Osteoarthritis of left knee 04/25/2020   Cellulitis 05/13/2019   Cellulitis of right lower extremity 05/12/2019   CKD (chronic kidney disease) stage 3, GFR 30-59 ml/min (HCC) 05/12/2019   Normochromic normocytic anemia 05/12/2019   Cellulitis, leg 05/12/2019   Impacted cerumen of left ear 02/22/2019   Laryngopharyngeal reflux (LPR) 02/22/2019   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   Pain in left knee 11/13/2018   Cellulitis and abscess of left leg 07/22/2018   Primary open angle glaucoma of both eyes, mild stage 05/08/2018   Cough 05/07/2016   Upper airway cough syndrome 05/07/2016   Sebaceous cyst of labia 04/19/2016   OAB (overactive bladder) 04/19/2016   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   Lymphedema 05/03/2012   CHF (congestive heart failure) ([place]) 04/16/2011   SICKLE-CELL TRAIT 08/01/2007   UNSPECIFIED GLAUCOMA 08/01/2007   DVT 08/01/2007   VENOUS INSUFFICIENCY 08/01/2007   ALLERGIC RHINITIS 08/01/2007   Extrinsic asthma 08/01/2007    Current Outpatient Medications on File Prior to Visit  Medication Sig Dispense Refill   acetaminophen (TYLENOL) 500 MG tablet Take 1,000 mg by mouth every 6 (six) hours as needed for mild pain.      albuterol (VENTOLIN HFA) 108 (90 Base) MCG/ACT inhaler      alum & mag hydroxide-simeth (MAALOX/MYLANTA) 200-200-20 MG/5ML suspension Take 15 mLs by mouth every 6 (six) hours as needed for indigestion or heartburn.     aspirin 81 MG tablet Take 81 mg by mouth daily.     atorvastatin (LIPITOR) 10 MG tablet      blood glucose meter kit and supplies KIT Dispense based on patient and insurance preference. Use up to four times daily as directed. (FOR  ICD-9 250.00, 250.01). 1 each 0   Cholecalciferol (VITAMIN D3) 25 MCG (1000 UT) CAPS Take 1 capsule (1,000 Units total) by mouth daily. 30 capsule 0   famotidine (PEPCID) 40 MG tablet Take 1 tablet (40 mg total) by mouth at bedtime. 30 tablet 11   ferrous sulfate 325 (65 FE) MG tablet Take 325 mg by mouth 3 (three) times a week.      fluticasone (FLONASE) 50 MCG/ACT nasal spray Place 1-2 sprays into both nostrils daily as needed for allergies or rhinitis.      furosemide (LASIX) 40 MG tablet $RemoveB'40mg'BWvKVpqI$  twice daily for 5 days, then $RemoveBe'40mg'nDGNrHxlj$  once daily. 60 tablet 0   glipizide-metformin (METAGLIP) 2.5-250 MG tablet Take by mouth.     omeprazole (PRILOSEC) 20 MG capsule Take 1 capsule (20 mg total) by mouth daily. 30 capsule 0   potassium chloride SA (K-DUR,KLOR-CON) 20 MEQ tablet Take 20 mEq by mouth daily.      RABEprazole (ACIPHEX) 20 MG tablet Take 1 tablet (20 mg total) by mouth daily. 30 tablet 11   TRAVATAN Z 0.004 % SOLN ophthalmic solution Place 1 drop into both eyes at bedtime.      triamcinolone (KENALOG) 0.025 % cream Apply 1 application topically 2 (two) times daily.     Vitamin D, Ergocalciferol, (DRISDOL) 1.25 MG (50000 UNIT) CAPS capsule Take 50,000 Units by mouth every 7 (seven) days.     [DISCONTINUED] metFORMIN (GLUCOPHAGE) 500 MG tablet Take 1 tablet (500 mg total) by  mouth 2 (two) times daily with a meal. (Patient not taking: Reported on 06/14/2019) 60 tablet 0   No current facility-administered medications on file prior to visit.    No Known Allergies  Objective: Physical Exam  General: Well developed, nourished, no acute distress, awake, alert and oriented x 3  Vascular: Dorsalis pedis artery 0/4 bilateral, Posterior tibial artery 0/4 bilateral, skin temperature warm to warm proximal to distal bilateral lower extremities, severe lymphedema bilateral with left most involved, no pedal hair present bilateral.  Neurological: Gross sensation present via light touch bilateral.    Dermatological: Skin is warm, dry, and supple bilateral, Nails 1-10 are tender, long, thick, and discolored with mild subungal debris, no webspace macerations present bilateral, no open lesions present bilateral, no callus/corns/hyperkeratotic tissue present bilateral. No signs of infection bilateral. + Dry scaly skin to feet.   Musculoskeletal: Asymptomatic Pes planus and hammertoe boney deformities noted bilateral.  Assessment and Plan:  Problem List Items Addressed This Visit       Genitourinary   CKD (chronic kidney disease) stage 3, GFR 30-59 ml/min (HCC)     Other   Lymphedema   Other Visit Diagnoses     Pain due to onychomycosis of toenails of both feet    -  Primary   Tinea pedis of both feet           -Examined patient.  -Discussed treatment options for painful mycotic nails. -Mechanically debrided and reduced mycotic nails with sterile nail nipper and dremel nail file without incident. -Recommend OTC tinactin or lamisil spray  -Patient to return in 3 months for follow up evaluation or sooner if symptoms worsen.  Landis Martins, DPM

## 2021-02-20 ENCOUNTER — Telehealth: Payer: Self-pay | Admitting: Physician Assistant

## 2021-02-20 DIAGNOSIS — K219 Gastro-esophageal reflux disease without esophagitis: Secondary | ICD-10-CM

## 2021-02-20 DIAGNOSIS — R059 Cough, unspecified: Secondary | ICD-10-CM

## 2021-02-20 NOTE — Telephone Encounter (Signed)
Patient called inquiring about results for Barium swallow. Please call her.

## 2021-02-23 NOTE — Telephone Encounter (Signed)
Report is in [place]. You can send the results to me through the usual method and I will call her.

## 2021-02-25 NOTE — Telephone Encounter (Signed)
Inbound call from patient is following up with results. Best contact [phone] or [phone]

## 2021-02-26 ENCOUNTER — Other Ambulatory Visit: Payer: Self-pay

## 2021-02-26 NOTE — Telephone Encounter (Signed)
Spoke with the patient at length about her imaging results.

## 2021-02-27 MED ORDER — RABEPRAZOLE SODIUM 20 MG PO TBEC: 20.0000 mg | DELAYED_RELEASE_TABLET | Freq: Two times a day (BID) | ORAL | 6 refills | Status: DC

## 2021-02-27 NOTE — Telephone Encounter (Signed)
Spoke with patient and informed her of results and rec's. Patient states she was still having some coughing and throat irritation, mostly in the morning/day. Patient states these symptoms occur after drinking cold drinks, eating food. States symptoms are periodic. RN and patient went over medication change and when to take the famotidine and aciphlex. Patient verbalized understanding, nothing further needed at this time.

## 2021-02-27 NOTE — Addendum Note (Signed)
Addended by: Thedore Mins D on: 02/27/2021 02:13 PM   Modules accepted: Orders

## 2021-03-11 ENCOUNTER — Other Ambulatory Visit: Payer: Medicare PPO

## 2021-03-19 ENCOUNTER — Ambulatory Visit
Admission: RE | Admit: 2021-03-19 | Discharge: 2021-03-19 | Disposition: A | Discharge status: Home / Self Care | Payer: Medicare PPO | Source: Ambulatory Visit | Attending: Nephrology | Admitting: Nephrology

## 2021-03-19 ENCOUNTER — Other Ambulatory Visit: Payer: Self-pay

## 2021-03-19 DIAGNOSIS — N1831 Chronic kidney disease, stage 3a: Secondary | ICD-10-CM

## 2021-03-24 ENCOUNTER — Encounter: Payer: Self-pay | Admitting: Gastroenterology

## 2021-03-24 ENCOUNTER — Other Ambulatory Visit: Payer: Self-pay

## 2021-03-24 ENCOUNTER — Ambulatory Visit (AMBULATORY_SURGERY_CENTER): Payer: Medicare PPO | Admitting: Gastroenterology

## 2021-03-24 VITALS — BP 113/69 | HR 85 | Temp 97.3°F | Resp 14 | Ht 69.0 in | Wt 225.0 lb

## 2021-03-24 DIAGNOSIS — Z1211 Encounter for screening for malignant neoplasm of colon: Secondary | ICD-10-CM

## 2021-03-24 DIAGNOSIS — D123 Benign neoplasm of transverse colon: Secondary | ICD-10-CM

## 2021-03-24 DIAGNOSIS — D122 Benign neoplasm of ascending colon: Secondary | ICD-10-CM

## 2021-03-24 MED ORDER — SODIUM CHLORIDE 0.9 % IV SOLN: 500.0000 mL | Freq: Once | INTRAVENOUS | Status: DC

## 2021-03-24 NOTE — Progress Notes (Signed)
VS taken by C.W. 

## 2021-03-24 NOTE — Progress Notes (Signed)
Called to room to assist during endoscopic procedure.  Patient ID and intended procedure confirmed with present staff. Received instructions for my participation in the procedure from the performing physician.  

## 2021-03-24 NOTE — Patient Instructions (Signed)
Handout on polyps given to you today  Await pathology results from Dr. Ardis Hughs  YOU HAD AN ENDOSCOPIC PROCEDURE TODAY AT THE [facility]:   Refer to the procedure report that was given to you for any specific questions about what was found during the examination.  If the procedure report does not answer your questions, please call your gastroenterologist to clarify.  If you requested that your care partner not be given the details of your procedure findings, then the procedure report has been included in a sealed envelope for you to review at your convenience later.  YOU SHOULD EXPECT: Some feelings of bloating in the abdomen. Passage of more gas than usual.  Walking can help get rid of the air that was put into your GI tract during the procedure and reduce the bloating. If you had a lower endoscopy (such as a colonoscopy or flexible sigmoidoscopy) you may notice spotting of blood in your stool or on the toilet paper. If you underwent a bowel prep for your procedure, you may not have a normal bowel movement for a few days.  Please Note:  You might notice some irritation and congestion in your nose or some drainage.  This is from the oxygen used during your procedure.  There is no need for concern and it should clear up in a day or so.  SYMPTOMS TO REPORT IMMEDIATELY:  Following lower endoscopy (colonoscopy or flexible sigmoidoscopy):  Excessive amounts of blood in the stool  Significant tenderness or worsening of abdominal pains  Swelling of the abdomen that is new, acute  Fever of 100F or higher  For urgent or emergent issues, a gastroenterologist can be reached at any hour by calling [phone]. Do not use MyChart messaging for urgent concerns.    DIET:  We do recommend a small meal at first, but then you may proceed to your regular diet.  Drink plenty of fluids but you should avoid alcoholic beverages for 24 hours.  ACTIVITY:  You should plan to take it easy for the rest of  today and you should NOT DRIVE or use heavy machinery until tomorrow (because of the sedation medicines used during the test).    FOLLOW UP: Our staff will call the number listed on your records 48-72 hours following your procedure to check on you and address any questions or concerns that you may have regarding the information given to you following your procedure. If we do not reach you, we will leave a message.  We will attempt to reach you two times.  During this call, we will ask if you have developed any symptoms of COVID 19. If you develop any symptoms (ie: fever, flu-like symptoms, shortness of breath, cough etc.) before then, please call [phone].  If you test positive for Covid 19 in the 2 weeks post procedure, please call and report this information to [place].    If any biopsies were taken you will be contacted by phone or by letter within the next 1-3 weeks.  Please call us at [phone] if you have not heard about the biopsies in 3 weeks.    SIGNATURES/CONFIDENTIALITY: You and/or your care partner have signed paperwork which will be entered into your electronic medical record.  These signatures attest to the fact that that the information above on your After Visit Summary has been reviewed and is understood.  Full responsibility of the confidentiality of this discharge information lies with you and/or your care-partner.

## 2021-03-24 NOTE — Progress Notes (Signed)
PT taken to PACU. Monitors in place. VSS. Report given to RN. 

## 2021-03-24 NOTE — Op Note (Signed)
[place] Patient Name: Jessica Yates Procedure Date: 03/24/2021 2:22 PM MRN: [id number] Endoscopist: Milus Banister , MD Age: 70 Referring MD:  Date of Birth: May 24, 1951 Gender: Female Account #: [id number] Procedure:                Colonoscopy Indications:              Screening for colorectal malignant neoplasm Medicines:                Monitored Anesthesia Care Procedure:                Pre-Anesthesia Assessment:                           - Prior to the procedure, a History and Physical                            was performed, and patient medications and                            allergies were reviewed. The patient's tolerance of                            previous anesthesia was also reviewed. The risks                            and benefits of the procedure and the sedation                            options and risks were discussed with the patient.                            All questions were answered, and informed consent                            was obtained. Prior Anticoagulants: The patient has                            taken no previous anticoagulant or antiplatelet                            agents. ASA Grade Assessment: III - A patient with                            severe systemic disease. After reviewing the risks                            and benefits, the patient was deemed in                            satisfactory condition to undergo the procedure.                           After obtaining informed consent, the colonoscope  was passed under direct vision. Throughout the                            procedure, the patient's blood pressure, pulse, and                            oxygen saturations were monitored continuously. The                            Olympus [id number]) Colonoscope was                            introduced through the anus and advanced to the the                            cecum,  identified by appendiceal orifice and                            ileocecal valve. The colonoscopy was performed                            without difficulty. The patient tolerated the                            procedure well. The quality of the bowel                            preparation was good. The ileocecal valve,                            appendiceal orifice, and rectum were photographed. Scope In: 3:16:47 PM Scope Out: 3:25:54 PM Scope Withdrawal Time: 0 hours 5 minutes 41 seconds  Total Procedure Duration: 0 hours 9 minutes 7 seconds  Findings:                 Two sessile polyps were found in the transverse                            colon and ascending colon. The polyps were 3 to 4                            mm in size. These polyps were removed with a cold                            snare. Resection and retrieval were complete.                           The exam was otherwise without abnormality on                            direct and retroflexion views. Complications:            No immediate complications. Estimated blood loss:  None. Estimated Blood Loss:     Estimated blood loss: none. Impression:               - Two 3 to 4 mm polyps in the transverse colon and                            in the ascending colon, removed with a cold snare.                            Resected and retrieved.                           - The examination was otherwise normal on direct                            and retroflexion views. Recommendation:           - Patient has a contact number available for                            emergencies. The signs and symptoms of potential                            delayed complications were discussed with the                            patient. Return to normal activities tomorrow.                            Written discharge instructions were provided to the                            patient.                           -  Resume previous diet.                           - Continue present medications.                           - Await pathology results. Milus Banister, MD 03/24/2021 3:29:16 PM This report has been signed electronically.

## 2021-03-26 ENCOUNTER — Telehealth: Payer: Self-pay | Admitting: *Deleted

## 2021-03-26 NOTE — Telephone Encounter (Signed)
  Follow up Call-  Call back number 03/24/2021  Post procedure Call Back phone  # [phone]  Permission to leave phone message Yes  Some recent data might be hidden     Patient questions:  Do you have a fever, pain , or abdominal swelling? No. Pain Score  0 *  Have you tolerated food without any problems? Yes.    Have you been able to return to your normal activities? Yes.    Do you have any questions about your discharge instructions: Diet   No. Medications  No. Follow up visit  No.  Do you have questions or concerns about your Care? No.  Actions: * If pain score is 4 or above: No action needed, pain <4.

## 2021-03-31 ENCOUNTER — Encounter: Payer: Self-pay | Admitting: Gastroenterology

## 2021-04-10 ENCOUNTER — Telehealth: Payer: Self-pay | Admitting: Gastroenterology

## 2021-04-10 NOTE — Telephone Encounter (Signed)
All the pt's questions were answered. She wanted to be sure she understood that the polyps were not cancerous. I explained that they were "pre cancerous" and NOT cancer.  She verbalized understanding.

## 2021-04-10 NOTE — Telephone Encounter (Signed)
Inbound call from patient requesting a call back about the letter she received about her colonoscopy results.

## 2021-04-13 NOTE — Progress Notes (Deleted)
70 y.o. G0P0000 Single {Race/ethnicity:17218} female here for annual exam.    PCP:     No LMP recorded. Patient has had a hysterectomy.           Sexually active: {yes no:314532}  The current method of family planning is status post hysterectomy.    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 She has never used smokeless tobacco. She reports that she does not drink alcohol and does not use drugs.  Past Medical History:  Diagnosis Date   Allergic rhinitis    Anemia 2002   Anxiety    Arthritis    Asthma    Blood clot in vein    Cellulitis    Cellulitis of right lower extremity    Chronic diastolic (congestive) heart failure (HCC)    Chronic kidney disease, stage 3 (HCC)    Coagulation defect (HCC)    Cough    Depression 02/06/2008   Diabetes mellitus, type 2 (HCC)    DVT (deep venous thrombosis) ([place]) 2002   left leg   Dysphagia    Elevated hemoglobin A1c    GERD (gastroesophageal reflux disease)    Glaucoma    Holosystolic murmur    Kidney cysts    Leukocytosis    Liver cyst    Lymphedema    Morbid obesity ([place])    Overactive bladder    Palpitations    Peripheral venous insufficiency    Primary open angle glaucoma (POAG) of both eyes, mild stage    Seasonal allergies    Sickle cell trait (HCC)    Tortuous aorta (HCC)    Unstable gait    Upper airway cough syndrome    Vitamin D deficiency     Past Surgical History:  Procedure Laterality Date   COSMETIC SURGERY  1975   nasal reconstruction   TOTAL ABDOMINAL HYSTERECTOMY  07/2001    Current Outpatient Medications  Medication Sig Dispense Refill   acetaminophen (TYLENOL) 500 MG  tablet Take 1,000 mg by mouth every 6 (six) hours as needed for mild pain.      albuterol (VENTOLIN HFA) 108 (90 Base) MCG/ACT inhaler      alum & mag hydroxide-simeth (MAALOX/MYLANTA) 200-200-20 MG/5ML suspension Take 15 mLs by mouth every 6 (six) hours as needed for indigestion or heartburn.     aspirin 81 MG tablet Take 81 mg by mouth daily.     atorvastatin (LIPITOR) 10 MG tablet      blood glucose meter kit and supplies KIT Dispense based on patient and insurance preference. Use up to four times daily as directed. (FOR ICD-9 250.00, 250.01). 1 each 0   Cholecalciferol (VITAMIN D3) 25 MCG (1000 UT) CAPS Take 1 capsule (1,000 Units total) by mouth daily. 30 capsule 0   famotidine (PEPCID) 40 MG tablet Take 1 tablet (40 mg total) by mouth at bedtime. 30 tablet 11   ferrous sulfate 325 (65 FE) MG tablet Take 325 mg by mouth 3 (three) times a week.      fluticasone (FLONASE) 50 MCG/ACT nasal spray Place 1-2 sprays into both nostrils daily as needed for allergies or rhinitis.  furosemide (LASIX) 40 MG tablet 20m twice daily for 5 days, then 445monce daily. 60 tablet 0   glipizide-metformin (METAGLIP) 2.5-250 MG tablet Take by mouth.     potassium chloride SA (K-DUR,KLOR-CON) 20 MEQ tablet Take 20 mEq by mouth daily.      RABEprazole (ACIPHEX) 20 MG tablet Take 1 tablet (20 mg total) by mouth 2 (two) times daily. Take one tablet before breakfast, and one tablet before dinner 60 tablet 6   TRAVATAN Z 0.004 % SOLN ophthalmic solution Place 1 drop into both eyes at bedtime.      triamcinolone (KENALOG) 0.025 % cream Apply 1 application topically 2 (two) times daily.     Vitamin D, Ergocalciferol, (DRISDOL) 1.25 MG (50000 UNIT) CAPS capsule Take 50,000 Units by mouth every 7 (seven) days.     No current facility-administered medications for this visit.    Family History  Problem Relation Age of Onset   Allergies Mother    Asthma Mother    Other Mother        enlarged heart   Arthritis  Mother    Breast cancer Mother    Diabetes Mother    Hypertension Mother    Cancer Mother        breast   Hyperlipidemia Mother    Heart disease Maternal Grandmother    Breast cancer Maternal Grandmother    Birth defects Maternal Grandmother        breast   Cancer Father [id number]     leukemia   Allergies Sister    Arthritis Sister     Review of Systems  Exam:   There were no vitals taken for this visit.    General appearance: alert, cooperative and appears stated age Head: normocephalic, without obvious abnormality, atraumatic Neck: no adenopathy, supple, symmetrical, trachea midline and thyroid normal to inspection and palpation Lungs: clear to auscultation bilaterally Breasts: normal appearance, no masses or tenderness, No nipple retraction or dimpling, No nipple discharge or bleeding, No axillary adenopathy Heart: regular rate and rhythm Abdomen: soft, non-tender; no masses, no organomegaly Extremities: extremities normal, atraumatic, no cyanosis or edema Skin: skin color, texture, turgor normal. No rashes or lesions Lymph nodes: cervical, supraclavicular, and axillary nodes normal. Neurologic: grossly normal  Pelvic: External genitalia:  no lesions              No abnormal inguinal nodes palpated.              Urethra:  normal appearing urethra with no masses, tenderness or lesions              Bartholins and Skenes: normal                 Vagina: normal appearing vagina with normal color and discharge, no lesions              Cervix: no lesions              Pap taken: {yes no:314532} Bimanual Exam:  Uterus:  normal size, contour, position, consistency, mobility, non-tender              Adnexa: no mass, fullness, tenderness              Rectal exam: {yes no:314532}.  Confirms.              Anus:  normal sphincter tone, no lesions  Chaperone was present for exam:  ***  Assessment:   Well woman visit with gynecologic exam.  Plan: Mammogram screening discussed. Self  breast awareness reviewed. Pap and HR HPV as above. Guidelines for Calcium, Vitamin D, regular exercise program including cardiovascular and weight bearing exercise.   Follow up annually and prn.   Additional counseling given.  {yes [id number]. _______ minutes face to face time of which over 50% was spent in counseling.    After visit summary provided.

## 2021-04-17 ENCOUNTER — Ambulatory Visit: Payer: Medicare PPO | Admitting: Obstetrics and Gynecology

## 2021-04-17 DIAGNOSIS — Z0289 Encounter for other administrative examinations: Secondary | ICD-10-CM

## 2021-04-20 NOTE — Progress Notes (Signed)
GYNECOLOGY  VISIT   HPI: 70 y.o.   Single  African American  female   G0P0000 with No LMP recorded. Patient has had a hysterectomy.   here for vulvar lesions.  Symptoms for one year.  Less prominent.   Notices some lumps with washing.   Notices something rough on the left vulvar regions.   No bleeding.   Has had some discharge staining in her underwear.   No pain.   GYNECOLOGIC HISTORY: No LMP recorded. Patient has had a hysterectomy. Contraception:  Hyst Menopausal hormone therapy: none Last mammogram:  05/2020 normal per patient--[facility] Last pap smear: 04-19-16 Neg:Neg HR HPv        OB History     Gravida  0   Para  0   Term  0   Preterm  0   AB  0   Living  0      SAB  0   IAB  0   Ectopic  0   Multiple  0   Live Births  0              Patient Active Problem List   Diagnosis Date Noted   Osteoarthritis of left knee 04/25/2020   Cellulitis 05/13/2019   Cellulitis of right lower extremity 05/12/2019   CKD (chronic kidney disease) stage 3, GFR 30-59 ml/min (HCC) 05/12/2019   Normochromic normocytic anemia 05/12/2019   Cellulitis, leg 05/12/2019   Impacted cerumen of left ear 02/22/2019   Laryngopharyngeal reflux (LPR) 02/22/2019   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   Pain in left knee 11/13/2018   Cellulitis and abscess of left leg 07/22/2018   Primary open angle glaucoma of both eyes, mild stage 05/08/2018   Cough 05/07/2016   Upper airway cough syndrome 05/07/2016   Sebaceous cyst of labia 04/19/2016   OAB (overactive bladder) 04/19/2016   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   Lymphedema 05/03/2012   CHF (congestive heart failure) ([place]) 04/16/2011   SICKLE-CELL TRAIT 08/01/2007   UNSPECIFIED GLAUCOMA 08/01/2007   DVT 08/01/2007   VENOUS INSUFFICIENCY 08/01/2007   ALLERGIC RHINITIS 08/01/2007   Extrinsic asthma 08/01/2007    Past Medical History:  Diagnosis Date   Allergic rhinitis    Anemia 2002    Anxiety    Arthritis    Asthma    Blood clot in vein    Cellulitis    Cellulitis of right lower extremity    Chronic diastolic (congestive) heart failure (HCC)    Chronic kidney disease, stage 3 (HCC)    Coagulation defect ([place])    Cough    Depression 02/06/2008   Diabetes mellitus, type 2 ([place])    DVT (deep venous thrombosis) ([place]) 2002   left leg   Dysphagia    Elevated hemoglobin A1c    GERD (gastroesophageal reflux disease)    Glaucoma    Holosystolic murmur    Kidney cysts    Leukocytosis    Liver cyst    Lymphedema    Morbid obesity ([place])    Overactive bladder    Palpitations    Peripheral venous insufficiency    Primary open angle glaucoma (POAG) of both eyes, mild stage    Seasonal allergies    Sickle cell trait ([place])    Tortuous aorta (HCC)    Unstable gait    Upper airway cough syndrome    Vitamin D deficiency     Past Surgical History:  Procedure Laterality Date   [place]  nasal reconstruction   TOTAL ABDOMINAL HYSTERECTOMY  07/2001    Current Outpatient Medications  Medication Sig Dispense Refill   acetaminophen (TYLENOL) 500 MG tablet Take 1,000 mg by mouth every 6 (six) hours as needed for mild pain.      albuterol (VENTOLIN HFA) 108 (90 Base) MCG/ACT inhaler      alum & mag hydroxide-simeth (MAALOX/MYLANTA) 200-200-20 MG/5ML suspension Take 15 mLs by mouth every 6 (six) hours as needed for indigestion or heartburn.     atorvastatin (LIPITOR) 10 MG tablet      blood glucose meter kit and supplies KIT Dispense based on patient and insurance preference. Use up to four times daily as directed. (FOR ICD-9 250.00, 250.01). 1 each 0   Cholecalciferol (VITAMIN D3) 25 MCG (1000 UT) CAPS Take 1 capsule (1,000 Units total) by mouth daily. 30 capsule 0   famotidine (PEPCID) 40 MG tablet Take 1 tablet (40 mg total) by mouth at bedtime. 30 tablet 11   fluticasone (FLONASE) 50 MCG/ACT nasal spray Place 1-2 sprays into both nostrils daily as needed  for allergies or rhinitis.      furosemide (LASIX) 40 MG tablet 105m twice daily for 5 days, then 420monce daily. 60 tablet 0   glipizide-metformin (METAGLIP) 2.5-250 MG tablet Take by mouth.     potassium chloride SA (K-DUR,KLOR-CON) 20 MEQ tablet Take 20 mEq by mouth daily.      RABEprazole (ACIPHEX) 20 MG tablet Take 1 tablet (20 mg total) by mouth 2 (two) times daily. Take one tablet before breakfast, and one tablet before dinner 60 tablet 6   TRAVATAN Z 0.004 % SOLN ophthalmic solution Place 1 drop into both eyes at bedtime.      triamcinolone (KENALOG) 0.025 % cream Apply 1 application topically 2 (two) times daily.     No current facility-administered medications for this visit.     ALLERGIES: Patient has no known allergies.  Family History  Problem Relation Age of Onset   Allergies Mother    Asthma Mother    Other Mother        enlarged heart   Arthritis Mother    Breast cancer Mother    Diabetes Mother    Hypertension Mother    Cancer Mother        breast   Hyperlipidemia Mother    Heart disease Maternal Grandmother    Breast cancer Maternal Grandmother    Birth defects Maternal Grandmother        breast   Cancer Father [id number]     leukemia   Allergies Sister    Arthritis Sister     Social History   Socioeconomic History   Marital status: Single    Spouse name: Not on file   Number of children: 0   Years of education: Not on file   Highest education level: Not on file  Occupational History   Occupation: retired from [employer]   Smoking status: Never   Smokeless tobacco: Never  Vaping Use   Vaping Use: Never used  Substance and Sexual Activity   Alcohol use: No   Drug use: No   Sexual activity: Never  Other Topics Concern   Not on file  Social History Narrative   Lives with mother and brother         Social Determinants of Health   Financial Resource Strain: Not on file  Food Insecurity: Not on file   Transportation Needs: Not on  file  Physical Activity: Not on file  Stress: Not on file  Social Connections: Not on file  Intimate Partner Violence: Not on file    Review of Systems  All other systems reviewed and are negative.  PHYSICAL EXAMINATION:    BP 130/72   Pulse 95   SpO2 97%     General appearance: alert, cooperative and appears stated age   Pelvic: External genitalia:  no lesions              Urethra:  normal appearing urethra with no masses, tenderness or lesions              Bartholins and Skenes: normal                 Vagina: normal appearing vagina with normal color and discharge, no lesions              Cervix: absent                Bimanual Exam:  Uterus:  absent              Adnexa: no mass, fullness, tenderness               Chaperone was present for exam:  Joy, CMA.  ASSESSMENT  Vulvar lump per patient.  Not confirmed with examination today.  Pelvic exam with abnormal finding absent.  Status post hysterectomy for fibroids.  Ovaries remain.   PLAN  Reassurance given regarding vulvar appearance today.  No signs of malignancy or infection with pelvic exam.  Fu prn.    An After Visit Summary was printed and given to the patient.

## 2021-04-22 ENCOUNTER — Encounter: Payer: Self-pay | Admitting: Obstetrics and Gynecology

## 2021-04-22 ENCOUNTER — Ambulatory Visit: Payer: Medicare PPO | Admitting: Obstetrics and Gynecology

## 2021-04-22 ENCOUNTER — Other Ambulatory Visit: Payer: Self-pay

## 2021-04-22 VITALS — BP 130/72 | HR 95

## 2021-04-22 DIAGNOSIS — N9089 Other specified noninflammatory disorders of vulva and perineum: Secondary | ICD-10-CM

## 2021-04-22 DIAGNOSIS — Z01419 Encounter for gynecological examination (general) (routine) without abnormal findings: Secondary | ICD-10-CM

## 2021-05-22 ENCOUNTER — Ambulatory Visit (HOSPITAL_COMMUNITY)
Admission: EM | Admit: 2021-05-22 | Discharge: 2021-05-22 | Disposition: A | Discharge status: Home / Self Care | Payer: Medicare PPO

## 2021-05-22 ENCOUNTER — Other Ambulatory Visit: Payer: Self-pay

## 2021-05-22 ENCOUNTER — Encounter (HOSPITAL_COMMUNITY): Payer: Self-pay

## 2021-05-22 DIAGNOSIS — H5789 Other specified disorders of eye and adnexa: Secondary | ICD-10-CM | POA: Diagnosis not present

## 2021-05-22 NOTE — Discharge Instructions (Addendum)
Continue with eye drops prescribed by ophthalmologist  Use lubricating drops Avoid contacts Follow up with ophthalmologist on Monday

## 2021-05-22 NOTE — ED Triage Notes (Signed)
Pt presents with eye redness and a blister near her eye. States she feel irritation and some pain. C/o a burning feeling.

## 2021-05-22 NOTE — ED Provider Notes (Addendum)
[facility]    CSN: [id number] Arrival date & time: 05/22/21  1746      History   Chief Complaint Chief Complaint  Patient presents with   eye irritation    HPI Jessica Yates is a 70 y.o. female.   Pt complains of right eye redness and irritation that started a few weeks ago.  She was seen by her eye doctor two days ago. H/o glaucoma.  She was prescribed timolol and lubricating drops.  She reports redness has gotten worse with raised conjunctiva.  She denies pain or visual disturbances.  She has continued with her drops.    Past Medical History:  Diagnosis Date   Allergic rhinitis    Anemia 2002   Anxiety    Arthritis    Asthma    Blood clot in vein    Cellulitis    Cellulitis of right lower extremity    Chronic diastolic (congestive) heart failure (HCC)    Chronic kidney disease, stage 3 (HCC)    Coagulation defect (HCC)    Cough    Depression 02/06/2008   Diabetes mellitus, type 2 (HCC)    DVT (deep venous thrombosis) ([place]) 2002   left leg   Dysphagia    Elevated hemoglobin A1c    GERD (gastroesophageal reflux disease)    Glaucoma    Holosystolic murmur    Kidney cysts    Leukocytosis    Liver cyst    Lymphedema    Morbid obesity ([place])    Overactive bladder    Palpitations    Peripheral venous insufficiency    Primary open angle glaucoma (POAG) of both eyes, mild stage    Seasonal allergies    Sickle cell trait ([place])    Tortuous aorta (HCC)    Unstable gait    Upper airway cough syndrome    Vitamin D deficiency     Patient Active Problem List   Diagnosis Date Noted   Osteoarthritis of left knee 04/25/2020   Cellulitis 05/13/2019   Cellulitis of right lower extremity 05/12/2019   CKD (chronic kidney disease) stage 3, GFR 30-59 ml/min (HCC) 05/12/2019   Normochromic normocytic anemia 05/12/2019   Cellulitis, leg 05/12/2019   Impacted cerumen of left ear 02/22/2019   Laryngopharyngeal reflux (LPR) 02/22/2019   Presbycusis of both  ears 02/22/2019   Pain in left knee 11/13/2018   Cellulitis and abscess of left leg 07/22/2018   Primary open angle glaucoma of both eyes, mild stage 05/08/2018   Cough 05/07/2016   Upper airway cough syndrome 05/07/2016   Sebaceous cyst of labia 04/19/2016   OAB (overactive bladder) 04/19/2016   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   Lymphedema 05/03/2012   CHF (congestive heart failure) ([place]) 04/16/2011   SICKLE-CELL TRAIT 08/01/2007   UNSPECIFIED GLAUCOMA 08/01/2007   DVT 08/01/2007   VENOUS INSUFFICIENCY 08/01/2007   ALLERGIC RHINITIS 08/01/2007   Extrinsic asthma 08/01/2007    Past Surgical History:  Procedure Laterality Date   COSMETIC SURGERY  1975   nasal reconstruction   TOTAL ABDOMINAL HYSTERECTOMY  07/2001    OB History     Gravida  0   Para  0   Term  0   Preterm  0   AB  0   Living  0      SAB  0   IAB  0   Ectopic  0   Multiple  0   Live Births  0  Home Medications    Prior to Admission medications   Medication Sig Start Date End Date Taking? Authorizing Provider  acetaminophen (TYLENOL) 500 MG tablet Take 1,000 mg by mouth every 6 (six) hours as needed for mild pain.     [provider]  albuterol (VENTOLIN HFA) 108 (90 Base) MCG/ACT inhaler  06/11/20   [provider]  alum & mag hydroxide-simeth (MAALOX/MYLANTA) 200-200-20 MG/5ML suspension Take 15 mLs by mouth every 6 (six) hours as needed for indigestion or heartburn.    [provider]  atorvastatin (LIPITOR) 10 MG tablet  08/17/19   [provider]  blood glucose meter kit and supplies KIT Dispense based on patient and insurance preference. Use up to four times daily as directed. (FOR ICD-9 250.00, 250.01). 05/20/19   Kayleen Memos, DO  Cholecalciferol (VITAMIN D3) 25 MCG (1000 UT) CAPS Take 1 capsule (1,000 Units total) by mouth daily. 05/20/19   Kayleen Memos, DO  famotidine (PEPCID) 40 MG tablet Take 1 tablet  (40 mg total) by mouth at bedtime. 01/21/21   Esterwood, Amy S, PA-C  fluticasone (FLONASE) 50 MCG/ACT nasal spray Place 1-2 sprays into both nostrils daily as needed for allergies or rhinitis.  07/25/19   [provider]  furosemide (LASIX) 40 MG tablet $RemoveB'40mg'[id number]$  twice daily for 5 days, then $RemoveBe'40mg'[id number]$  once daily. 07/28/18   Bonnielee Haff, MD  glipizide-metformin (METAGLIP) 2.5-250 MG tablet Take by mouth.    [provider]  potassium chloride SA (K-DUR,KLOR-CON) 20 MEQ tablet Take 20 mEq by mouth daily.  04/27/16   [provider]  RABEprazole (ACIPHEX) 20 MG tablet Take 1 tablet (20 mg total) by mouth 2 (two) times daily. Take one tablet before breakfast, and one tablet before dinner 02/27/21   Esterwood, Amy S, PA-C  TRAVATAN Z 0.004 % SOLN ophthalmic solution Place 1 drop into both eyes at bedtime.  03/14/11   [provider]  triamcinolone (KENALOG) 0.025 % cream Apply 1 application topically 2 (two) times daily.    [provider]  metFORMIN (GLUCOPHAGE) 500 MG tablet Take 1 tablet (500 mg total) by mouth 2 (two) times daily with a meal. Patient not taking: Reported on 06/14/2019 05/20/19 08/02/19  Kayleen Memos, DO    Family History Family History  Problem Relation Age of Onset   Allergies Mother    Asthma Mother    Other Mother        enlarged heart   Arthritis Mother    Breast cancer Mother    Diabetes Mother    Hypertension Mother    Cancer Mother        breast   Hyperlipidemia Mother    Heart disease Maternal Grandmother    Breast cancer Maternal Grandmother    Birth defects Maternal Grandmother        breast   Cancer Father 73       leukemia   Allergies Sister    Arthritis Sister     Social History Social History   Tobacco Use   Smoking status: Never   Smokeless tobacco: Never  Vaping Use   Vaping Use: Never used  Substance Use Topics   Alcohol use: No   Drug use: No     Allergies   Patient has no known  allergies.   Review of Systems Review of Systems  Constitutional:  Negative for chills and fever.  HENT:  Negative for ear pain and sore throat.   Eyes:  Positive for redness.  Negative for pain, discharge and visual disturbance.  Respiratory:  Negative for cough and shortness of breath.   Cardiovascular:  Negative for chest pain and palpitations.  Gastrointestinal:  Negative for abdominal pain and vomiting.  Genitourinary:  Negative for dysuria and hematuria.  Musculoskeletal:  Negative for arthralgias and back pain.  Skin:  Negative for color change and rash.  Neurological:  Negative for seizures and syncope.  All other systems reviewed and are negative.   Physical Exam Triage Vital Signs ED Triage Vitals  Enc Vitals Group     BP 05/22/21 1855 (!) 152/92     Pulse Rate 05/22/21 1855 95     Resp 05/22/21 1855 19     Temp --      Temp src --      SpO2 05/22/21 1855 94 %     Weight --      Height --      Head Circumference --      Peak Flow --      Pain Score 05/22/21 1853 3     Pain Loc --      Pain Edu? --      Excl. in [place]? --    No data found.  Updated Vital Signs BP 138/81   Pulse 95   Resp 19   SpO2 94%   Visual Acuity Right Eye Distance:   Left Eye Distance:   Bilateral Distance:    Right Eye Near:   Left Eye Near:    Bilateral Near:     Physical Exam Vitals and nursing note reviewed.  Constitutional:      General: She is not in acute distress.    Appearance: She is well-developed.  HENT:     Head: Normocephalic and atraumatic.  Eyes:     General:        Right eye: No foreign body or discharge.     Conjunctiva/sclera:     Right eye: Right conjunctiva is injected. Hemorrhage present.  Cardiovascular:     Rate and Rhythm: Normal rate and regular rhythm.     Heart sounds: No murmur heard. Pulmonary:     Effort: Pulmonary effort is normal. No respiratory distress.     Breath sounds: Normal breath sounds.  Abdominal:     Palpations: Abdomen is  soft.     Tenderness: There is no abdominal tenderness.  Musculoskeletal:     Cervical back: Neck supple.  Skin:    General: Skin is warm and dry.  Neurological:     Mental Status: She is alert.     UC Treatments / Results  Labs (all labs ordered are listed, but only abnormal results are displayed) Labs Reviewed - No data to display  EKG   Radiology No results found.  Procedures Procedures (including critical care time)  Medications Ordered in UC Medications - No data to display  Initial Impression / Assessment and Plan / UC Course  I have reviewed the triage vital signs and the nursing notes.  Pertinent labs & imaging results that were available during my care of the patient were reviewed by me and considered in my medical decision making (see chart for details).     Pt without pain or visual changes.  She was just seen by ophthalmologist two days ago.  Advised to continue with prescribed drops and lubricating drops.  Call ophthalmologist on Monday.  If she develops pain or visual changes advised to go to the ED for further evaluation.  Final Clinical  Impressions(s) / UC Diagnoses   Final diagnoses:  Irritation of right eye     Discharge Instructions      Continue with eye drops prescribed by ophthalmologist  Use lubricating drops Avoid contacts Follow up with ophthalmologist on Monday    ED Prescriptions   None    PDMP not reviewed this encounter.   Ward, Lenise Arena, PA-C 05/22/21 1925    Ward, Lenise Arena, PA-C 05/22/21 [phone]

## 2021-06-03 ENCOUNTER — Other Ambulatory Visit: Payer: Self-pay

## 2021-06-03 ENCOUNTER — Ambulatory Visit: Payer: Medicare PPO | Admitting: Podiatry

## 2021-06-03 ENCOUNTER — Encounter: Payer: Self-pay | Admitting: Podiatry

## 2021-06-03 DIAGNOSIS — M79675 Pain in left toe(s): Secondary | ICD-10-CM

## 2021-06-03 DIAGNOSIS — B351 Tinea unguium: Secondary | ICD-10-CM

## 2021-06-03 DIAGNOSIS — M79674 Pain in right toe(s): Secondary | ICD-10-CM

## 2021-06-09 NOTE — Progress Notes (Signed)
Subjective: Jessica Yates is a 70 y.o. female patient seen today for follow up of  painful thick toenails that are difficult to trim. Pain interferes with ambulation. Aggravating factors include wearing enclosed shoe gear. Pain is relieved with periodic professional debridement.  Patient states their blood glucose was 141 mg/dl today.   New problems reported today: None.  No Known Allergies  Objective: Physical Exam  General: Patient is a pleasant 70 y.o. African American female obese in NAD. AAO x 3.   Neurovascular Examination: Capillary fill time to digits <3 seconds b/l lower extremities. Faintly palpable DP pulse(s) b/l lower extremities. Nonpalpable PT pulse(s) b/l lower extremities. Pedal hair absent. Lower extremity skin temperature gradient within normal limits. No pain with calf compression b/l. Lymphedema present b/l lower extremities.  Protective sensation intact 5/5 intact bilaterally with 10g monofilament b/l.  Dermatological:  No open wounds b/l lower extremities. No interdigital macerations b/l lower extremities. Toenails 1-5 b/l elongated, discolored, dystrophic, thickened, crumbly with subungual debris and tenderness to dorsal palpation.  Musculoskeletal:  Normal muscle strength 5/5 to all lower extremity muscle groups bilaterally. Pes planus deformity noted b/l lower extremities.  Assessment: 1. Pain due to onychomycosis of toenails of both feet    Plan: Patient was evaluated and treated and all questions answered. Consent given for treatment as described below: -No new findings. No new orders. -Patient to continue soft, supportive shoe gear daily. -Toenails 1-5 b/l were debrided in length and girth with sterile nail nippers and dremel without iatrogenic bleeding.  -Patient to report any pedal injuries to medical professional immediately. -Patient/POA to call should there be question/concern in the interim.  Return in about 3 months (around  09/02/2021).  Marzetta Board, DPM

## 2021-06-16 ENCOUNTER — Encounter: Payer: Medicare PPO | Admitting: Obstetrics & Gynecology

## 2021-07-13 ENCOUNTER — Ambulatory Visit (HOSPITAL_COMMUNITY)
Admission: EM | Admit: 2021-07-13 | Discharge: 2021-07-13 | Disposition: A | Discharge status: Home / Self Care | Payer: Medicare PPO | Attending: Student | Admitting: Student

## 2021-07-13 ENCOUNTER — Encounter (HOSPITAL_COMMUNITY): Payer: Self-pay

## 2021-07-13 ENCOUNTER — Other Ambulatory Visit: Payer: Self-pay

## 2021-07-13 DIAGNOSIS — L03116 Cellulitis of left lower limb: Secondary | ICD-10-CM | POA: Diagnosis not present

## 2021-07-13 MED ORDER — DOXYCYCLINE HYCLATE 100 MG PO CAPS: 100.0000 mg | ORAL_CAPSULE | Freq: Two times a day (BID) | ORAL | 0 refills | Status: AC

## 2021-07-13 NOTE — ED Triage Notes (Signed)
Pt reports swelling and ain in the left leg x 4 days.

## 2021-07-13 NOTE — Discharge Instructions (Addendum)
-  Doxycycline twice daily for 7 days.  Make sure to wear sunscreen while spending time outside while on this medication as it can increase your chance of sunburn. You can take this medication with food if you have a sensitive stomach. -Wash with gentle soap and water -Wrap leg and use compression stockings -Follow-up with PCP for recheck in 3-5 days, or sooner if symptoms getting worse

## 2021-07-13 NOTE — ED Provider Notes (Addendum)
[place]    CSN: [id number] Arrival date & time: 07/13/21  1718      History   Chief Complaint Chief Complaint  Patient presents with   Leg Pain    HPI Jessica Yates is a 70 y.o. female presenting with L LE swelling and pain x4 days. Medical history cellulitis bilateral LEs. Also with medical history provoked DVT 2002, no DVT or PE since then. She is not on long-term anticoagulation. Describes pain, swelling, subjective chills. Has been taking tylenol arthritis with some improvement. Does not currently have a PCP. Denies SOB, CP, dizziness, weakness.   HPI  Past Medical History:  Diagnosis Date   Allergic rhinitis    Anemia 2002   Anxiety    Arthritis    Asthma    Blood clot in vein    Cellulitis    Cellulitis of right lower extremity    Chronic diastolic (congestive) heart failure (HCC)    Chronic kidney disease, stage 3 (HCC)    Coagulation defect (HCC)    Cough    Depression 02/06/2008   Diabetes mellitus, type 2 (HCC)    DVT (deep venous thrombosis) ([place]) 2002   left leg   Dysphagia    Elevated hemoglobin A1c    GERD (gastroesophageal reflux disease)    Glaucoma    Holosystolic murmur    Kidney cysts    Leukocytosis    Liver cyst    Lymphedema    Morbid obesity ([place])    Overactive bladder    Palpitations    Peripheral venous insufficiency    Primary open angle glaucoma (POAG) of both eyes, mild stage    Seasonal allergies    Sickle cell trait ([place])    Tortuous aorta (HCC)    Unstable gait    Upper airway cough syndrome    Vitamin D deficiency     Patient Active Problem List   Diagnosis Date Noted   Osteoarthritis of left knee 04/25/2020   Cellulitis 05/13/2019   Cellulitis of right lower extremity 05/12/2019   CKD (chronic kidney disease) stage 3, GFR 30-59 ml/min (HCC) 05/12/2019   Normochromic normocytic anemia 05/12/2019   Cellulitis, leg 05/12/2019   Impacted cerumen of left ear 02/22/2019   Laryngopharyngeal reflux (LPR)  02/22/2019   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   Pain in left knee 11/13/2018   Cellulitis and abscess of left leg 07/22/2018   Primary open angle glaucoma of both eyes, mild stage 05/08/2018   Cough 05/07/2016   Upper airway cough syndrome 05/07/2016   Sebaceous cyst of labia 04/19/2016   OAB (overactive bladder) 04/19/2016   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   Lymphedema 05/03/2012   CHF (congestive heart failure) ([place]) 04/16/2011   SICKLE-CELL TRAIT 08/01/2007   UNSPECIFIED GLAUCOMA 08/01/2007   DVT 08/01/2007   VENOUS INSUFFICIENCY 08/01/2007   ALLERGIC RHINITIS 08/01/2007   Extrinsic asthma 08/01/2007    Past Surgical History:  Procedure Laterality Date   COSMETIC SURGERY  1975   nasal reconstruction   TOTAL ABDOMINAL HYSTERECTOMY  07/2001    OB History     Gravida  0   Para  0   Term  0   Preterm  0   AB  0   Living  0      SAB  0   IAB  0   Ectopic  0   Multiple  0   Live Births  0  Home Medications    Prior to Admission medications   Medication Sig Start Date End Date Taking? Authorizing Provider  doxycycline (VIBRAMYCIN) 100 MG capsule Take 1 capsule (100 mg total) by mouth 2 (two) times daily for 7 days. 07/13/21 07/20/21 Yes Hazel Sams, PA-C  acetaminophen (TYLENOL) 500 MG tablet Take 1,000 mg by mouth every 6 (six) hours as needed for mild pain.     [provider]  albuterol (VENTOLIN HFA) 108 (90 Base) MCG/ACT inhaler  06/11/20   [provider]  alum & mag hydroxide-simeth (MAALOX/MYLANTA) 200-200-20 MG/5ML suspension Take 15 mLs by mouth every 6 (six) hours as needed for indigestion or heartburn.    [provider]  atorvastatin (LIPITOR) 10 MG tablet  08/17/19   [provider]  blood glucose meter kit and supplies KIT Dispense based on patient and insurance preference. Use up to four times daily as directed. (FOR ICD-9 250.00, 250.01). 05/20/19   Kayleen Memos, DO  Cholecalciferol (VITAMIN D3) 25 MCG (1000 UT) CAPS Take 1 capsule (1,000 Units total) by mouth daily. 05/20/19   Kayleen Memos, DO  famotidine (PEPCID) 40 MG tablet Take 1 tablet (40 mg total) by mouth at bedtime. 01/21/21   Esterwood, Amy S, PA-C  fluticasone (FLONASE) 50 MCG/ACT nasal spray Place 1-2 sprays into both nostrils daily as needed for allergies or rhinitis.  07/25/19   [provider]  furosemide (LASIX) 40 MG tablet $RemoveB'40mg'efRdbZxx$  twice daily for 5 days, then $RemoveBe'40mg'fMYFIOovH$  once daily. 07/28/18   Bonnielee Haff, MD  glipizide-metformin (METAGLIP) 2.5-250 MG tablet Take by mouth.    [provider]  potassium chloride SA (K-DUR,KLOR-CON) 20 MEQ tablet Take 20 mEq by mouth daily.  04/27/16   [provider]  RABEprazole (ACIPHEX) 20 MG tablet Take 1 tablet (20 mg total) by mouth 2 (two) times daily. Take one tablet before breakfast, and one tablet before dinner 02/27/21   Esterwood, Amy S, PA-C  TRAVATAN Z 0.004 % SOLN ophthalmic solution Place 1 drop into both eyes at bedtime.  03/14/11   [provider]  triamcinolone (KENALOG) 0.025 % cream Apply 1 application topically 2 (two) times daily.    [provider]  metFORMIN (GLUCOPHAGE) 500 MG tablet Take 1 tablet (500 mg total) by mouth 2 (two) times daily with a meal. Patient not taking: Reported on 06/14/2019 05/20/19 08/02/19  Kayleen Memos, DO    Family History Family History  Problem Relation Age of Onset   Allergies Mother    Asthma Mother    Other Mother        enlarged heart   Arthritis Mother    Breast cancer Mother    Diabetes Mother    Hypertension Mother    Cancer Mother        breast   Hyperlipidemia Mother    Heart disease Maternal Grandmother    Breast cancer Maternal Grandmother    Birth defects Maternal Grandmother        breast   Cancer Father 52       leukemia   Allergies Sister    Arthritis Sister     Social History Social History   Tobacco Use   Smoking status:  Never   Smokeless tobacco: Never  Vaping Use   Vaping Use: Never used  Substance Use Topics   Alcohol use: No   Drug use: No     Allergies   Patient has no known allergies.   Review of Systems  Review of Systems  Skin:  Positive for color change.  All other systems reviewed and are negative.   Physical Exam Triage Vital Signs ED Triage Vitals  Enc Vitals Group     BP      Pulse      Resp      Temp      Temp src      SpO2      Weight      Height      Head Circumference      Peak Flow      Pain Score      Pain Loc      Pain Edu?      Excl. in [place]?    No data found.  Updated Vital Signs BP (!) 149/91 (BP Location: Right Arm)   Pulse 100   Temp 99.1 F (37.3 C) (Oral)   Resp 20   SpO2 98%   Visual Acuity Right Eye Distance:   Left Eye Distance:   Bilateral Distance:    Right Eye Near:   Left Eye Near:    Bilateral Near:     Physical Exam Vitals reviewed.  Constitutional:      General: She is not in acute distress.    Appearance: Normal appearance. She is not ill-appearing or diaphoretic.  HENT:     Head: Normocephalic and atraumatic.  Cardiovascular:     Rate and Rhythm: Normal rate and regular rhythm.     Heart sounds: Normal heart sounds.  Pulmonary:     Effort: Pulmonary effort is normal.     Breath sounds: Normal breath sounds.  Musculoskeletal:     Comments: Negative homan sign bilaterally  Skin:    General: Skin is warm.     Comments: See images below L LE-extensive induration and tenderness and warmth.  Some clear drainage.  Left calf measures 51 cm, right calf measures 41 cm.  Negative Homans' sign, no calf distention or tenderness.  DP 2+, cap refill less than 2 seconds.  No thigh tenderness or distention.  Neurological:     General: No focal deficit present.     Mental Status: She is alert and oriented to person, place, and time.  Psychiatric:        Mood and Affect: Mood normal.        Behavior: Behavior normal.        Thought  Content: Thought content normal.        Judgment: Judgment normal.         UC Treatments / Results  Labs (all labs ordered are listed, but only abnormal results are displayed) Labs Reviewed - No data to display  EKG   Radiology No results found.  Procedures Procedures (including critical care time)  Medications Ordered in UC Medications - No data to display  Initial Impression / Assessment and Plan / UC Course  I have reviewed the triage vital signs and the nursing notes.  Pertinent labs & imaging results that were available during my care of the patient were reviewed by me and considered in my medical decision making (see chart for details).     This patient is a very pleasant 70 y.o. year old female presenting with cellulitis Left lower extremity. Afebrile, nontachy.  She does have a history of provoked DVT 2002, no issues with clotting since then.  Wells score 1. Negative homan sign.  Discharged to home with STRICT return precautions. Follow-up with PCP for recheck in 3 days, or  sooner if symptoms getting worse.   ED return precautions discussed. Patient verbalizes understanding and agreement.    Final Clinical Impressions(s) / UC Diagnoses   Final diagnoses:  Cellulitis of left lower extremity     Discharge Instructions      -Doxycycline twice daily for 7 days.  Make sure to wear sunscreen while spending time outside while on this medication as it can increase your chance of sunburn. You can take this medication with food if you have a sensitive stomach. -Wash with gentle soap and water -Wrap leg and use compression stockings -Follow-up with PCP for recheck in 3-5 days, or sooner if symptoms getting worse     ED Prescriptions     Medication Sig Dispense Auth. Provider   doxycycline (VIBRAMYCIN) 100 MG capsule Take 1 capsule (100 mg total) by mouth 2 (two) times daily for 7 days. 14 capsule Hazel Sams, PA-C      PDMP not reviewed this  encounter.   Hazel Sams, PA-C 07/13/21 1903    Hazel Sams, PA-C 07/13/21 1909

## 2021-07-21 ENCOUNTER — Other Ambulatory Visit: Payer: Self-pay

## 2021-07-21 ENCOUNTER — Ambulatory Visit (HOSPITAL_COMMUNITY)
Admission: EM | Admit: 2021-07-21 | Discharge: 2021-07-21 | Disposition: A | Discharge status: Home / Self Care | Payer: Medicare PPO | Attending: Physician Assistant | Admitting: Physician Assistant

## 2021-07-21 ENCOUNTER — Encounter (HOSPITAL_COMMUNITY): Payer: Self-pay

## 2021-07-21 DIAGNOSIS — I872 Venous insufficiency (chronic) (peripheral): Secondary | ICD-10-CM

## 2021-07-21 NOTE — Discharge Instructions (Signed)
Follow up with vascular specialist

## 2021-07-21 NOTE — ED Triage Notes (Signed)
Pt presents for a follow up from her previous visit. States she has cellulitis and states she has bee taking antibiotics.

## 2021-07-21 NOTE — Progress Notes (Addendum)
Cardiology Office Note    Date:  07/24/2021   ID:  Jessica Yates, DOB [birth year], MRN [id number]  PCP:  System, Provider Not In  Cardiologist:  Freada Bergeron, MD  Electrophysiologist:  None   Chief Complaint: f/u diastolic dysfunction  History of Present Illness:   Jessica Yates is a 70 y.o. female with history of asthma, diastolic dysfunction, chronic lymphedema (started age 72), frequent sinus tachycardia per chart review, DM2 (lipids managed in primary care), CKD stage 3a by labs, prior DVT 2002 (following injury), anemia by labs, sickle cell trait who presents for follow-up.  Per Dr. Jacolyn Reedy notes, the patient has known history of chronic lymphedema (started at age 83) and has recurrent cellulitis managed by vascular. She has difficulty ambulating due to LE swelling and arthritis and walks with cane. At one point she had been told she had congestive heart failure. 2D Echo in 2019 normal EF 55-60%, grade 1 DD, normal IVC. LE Venous duplex 07/2019 showed no evidence of DVT. Dr. Johney Frame has felt her edema is more likely to be due to her chronic lymphedema rather than heart failure. Also appears to have hx of low albumin on remote labs as well. It does appear her HR is chronically in the 90s-low 100s during visits (was 117 by EKG at [facility] 06/2020).   She is seen back for follow-up today. Her sister drove her to this appointment. She was seen by urgent care in October for LLE cellulitis and prescribed a course of antibiotics. This is the limb that typically gives her the most trouble. She has a f/u venous study today coming up with vascular follow-up. In general she feels as though she is doing well from a cardiac standpoint. Chronic edema persists. Denies specific change in breathing. She will get rare fleeting chest tightness about once a month in the context of anxiety or asthma but otherwise no more regular anginal-type symptoms. She does not feel her heart racing except  sometimes when she is pushing herself exertion-wise. Of note she arrived extremely late to today's appointment (50 minutes past recommended check-in time) and we made accomodations to see her. As she also had a vascular duplex scheduled with [facility] today, we called their office to make them aware as she was close to missing their appointment as well.   Labwork independently reviewed: 09/2020 K 3.7, Cr 1.0, glucose 145 07/2019 Hgb 10.8, albumin 2.9  Past Medical History:  Diagnosis Date   Allergic rhinitis    Anemia 2002   Anxiety    Arthritis    Asthma    Blood clot in vein    Cellulitis    Cellulitis of right lower extremity    Chronic diastolic (congestive) heart failure (HCC)    Chronic kidney disease, stage 3 (HCC)    Coagulation defect (HCC)    Cough    Depression 02/06/2008   Diabetes mellitus, type 2 (HCC)    DVT (deep venous thrombosis) ([place]) 2002   left leg   Dysphagia    Elevated hemoglobin A1c    GERD (gastroesophageal reflux disease)    Glaucoma    Holosystolic murmur    Kidney cysts    Leukocytosis    Liver cyst    Lymphedema    Morbid obesity ([place])    Overactive bladder    Palpitations    Peripheral venous insufficiency    Primary open angle glaucoma (POAG) of both eyes, mild stage    Seasonal allergies    Sickle  cell trait ([place])    Tortuous aorta (HCC)    Unstable gait    Upper airway cough syndrome    Vitamin D deficiency     Past Surgical History:  Procedure Laterality Date   COSMETIC SURGERY  1975   nasal reconstruction   TOTAL ABDOMINAL HYSTERECTOMY  07/2001    Current Medications: Current Meds  Medication Sig   acetaminophen (TYLENOL) 500 MG tablet Take 1,000 mg by mouth every 6 (six) hours as needed for mild pain.    albuterol (VENTOLIN HFA) 108 (90 Base) MCG/ACT inhaler    atorvastatin (LIPITOR) 10 MG tablet Take 10 mg by mouth at bedtime.   blood glucose meter kit and supplies KIT Dispense based on patient and insurance preference. Use up  to four times daily as directed. (FOR ICD-9 250.00, 250.01).   Cholecalciferol (VITAMIN D3) 25 MCG (1000 UT) CAPS Take 1 capsule (1,000 Units total) by mouth daily.   famotidine (PEPCID) 40 MG tablet Take 1 tablet (40 mg total) by mouth at bedtime.   fluticasone (FLONASE) 50 MCG/ACT nasal spray Place 1-2 sprays into both nostrils daily as needed for allergies or rhinitis.    furosemide (LASIX) 40 MG tablet 58m twice daily for 5 days, then 410monce daily.   glipiZIDE (GLUCOTROL XL) 2.5 MG 24 hr tablet Take 2.5 mg by mouth every morning.   potassium chloride SA (K-DUR,KLOR-CON) 20 MEQ tablet Take 20 mEq by mouth daily.    RABEprazole (ACIPHEX) 20 MG tablet Take 1 tablet (20 mg total) by mouth 2 (two) times daily. Take one tablet before breakfast, and one tablet before dinner   timolol (TIMOPTIC) 0.5 % ophthalmic solution 1 drop 2 (two) times daily.   TRAVATAN Z 0.004 % SOLN ophthalmic solution Place 1 drop into both eyes at bedtime.    [DISCONTINUED] atorvastatin (LIPITOR) 10 MG tablet      Allergies:   Patient has no known allergies.   Social History   Socioeconomic History   Marital status: Single    Spouse name: Not on file   Number of children: 0   Years of education: Not on file   Highest education level: Not on file  Occupational History   Occupation: retired from [employer] Use   Smoking status: Never   Smokeless tobacco: Never  Vaping Use   Vaping Use: Never used  Substance and Sexual Activity   Alcohol use: No   Drug use: No   Sexual activity: Never  Other Topics Concern   Not on file  Social History Narrative   Lives with mother and brother         Social Determinants of Health   Financial Resource Strain: Not on file  Food Insecurity: Not on file  Transportation Needs: Not on file  Physical Activity: Not on file  Stress: Not on file  Social Connections: Not on file     Family History:  The patient's family history includes  Allergies in her mother and sister; Arthritis in her mother and sister; Asthma in her mother; Birth defects in her maternal grandmother; Breast cancer in her maternal grandmother and mother; Cancer in her mother; Cancer (age of onset: 5020in her father; Diabetes in her mother; Heart disease in her maternal grandmother; Hyperlipidemia in her mother; Hypertension in her mother; Other in her mother.  ROS:   Please see the history of present illness.  All other systems are reviewed and otherwise negative.    EKGs/Labs/Other Studies Reviewed:  Studies reviewed are outlined and summarized above. Reports included below if pertinent.  2D echo 07/2018 - Left ventricle: The cavity size was normal. Wall thickness was    normal. Systolic function was normal. The estimated ejection    fraction was in the range of 55% to 60%. Wall motion was normal;    there were no regional wall motion abnormalities. Doppler    parameters are consistent with abnormal left ventricular    relaxation (grade 1 diastolic dysfunction). The E/e&' ratio is    between 8-15, suggesting inderminate LV filling pressure.  - Mitral valve: Mildly thickened leaflets . There was trivial    regurgitation.  - Left atrium: The atrium was normal in size.  - Inferior vena cava: The vessel was normal in size. The    respirophasic diameter changes were in the normal range (>= 50%),    consistent with normal central venous pressure.   Impressions:   - LVEF 55-60%, normal wall thickness, normal wall motion, grade 1    DD, indeterminate LV filling pressure, trivial MR, normal LA    size, normal IVC.     EKG:  EKG is ordered today, personally reviewed, demonstrating sinus tach 112bpm, nonspecific diffuse TWI inferiorly, V3-V6  Recent Labs: 10/13/2020: BUN 15; Creatinine, Ser 1.00; Potassium 3.7; Sodium 137  Recent Lipid Panel No results found for: CHOL, TRIG, HDL, CHOLHDL, VLDL, LDLCALC, LDLDIRECT  PHYSICAL EXAM:    VS:  BP 120/80    Pulse (!) 112   Ht $R'5\' 7"'OR$  (1.702 m)   Wt 227 lb 9.6 oz (103.2 kg)   SpO2 98%   BMI 35.65 kg/m   BMI: Body mass index is 35.65 kg/m.  GEN: Well nourished, well developed female in no acute distress HEENT: normocephalic, atraumatic; dull scleral tone Neck: no JVD, carotid bruits, or masses Cardiac: tachycardic, regular; no murmurs, rubs, or gallops, profound bilateral lower extremity edema L>R with wrapping of mid LLE shin Respiratory:  clear to auscultation bilaterally, normal work of breathing GI: soft, nontender, nondistended, + BS MS: no deformity or atrophy Skin: warm and dry, no rash Neuro:  Alert and Oriented x 3, Strength and sensation are intact, follows commands Psych: euthymic mood, full affect  Wt Readings from Last 3 Encounters:  07/24/21 227 lb 9.6 oz (103.2 kg)  03/24/21 225 lb (102.1 kg)  01/21/21 225 lb (102.1 kg)     ASSESSMENT & PLAN:   1. Chronic lymphedema and chronic venous insufficiency - chronic since age 89. Recently completed another course of abx by urgent care. Has follow-up with vascular surgery for LE duplex and appointment. Since she was late to today's visit we did call the vascular office to let them know the patient was being seen. She will continue current dose of furosemide and potassium. She needs updated labs as well - we will check her thyroid and CMET today.  2. Diastolic dysfunction - it is difficult in the context of lymphedema to discretely know where the overlap of lymphedema ends and diastolic HF begins. However, Dr. Johney Frame has felt in the past that lymphedema was the primary issue. Historically it is very difficult to "diurese off." Given that her BP is normal on current regimen I would not push further at this time. We will update her echocardiogram to ensure LVEF normal with her persistence of sinus tach. We will also be looking for any RV changes or pulmonary HTN. Reviewed 2g sodium restriction, 2L fluid restriction with patient. BP  controlled. Also discussed role of  weight.  3. Hypoalbuminemia - ?contribution to chronic edema. We will recheck CMET today.  4. Sinus tachycardia - appears to be a very frequent finding fo the patient going back to prior years as well. Her HR came down to 103 while seated. This suggests a component of deconditioining - the patient has a difficult time with ambulation in general and walks with a cane. However, she has not had her anemia rechecked or thyroid assessed in recent labs. In addition to labs above will get CBC, TSH. As this may be compensatory for her deconditioning, I would avoid specific beta blockade at this time unless she becomes symptomatic. She is not hypoxic today and does not describe any increasing dyspnea from baseline.  5. Diabetes in obese - Dr. Johney Frame previously suggested calcium score. Patient would like to pursue. Will arrange. Will also update lipid profile today with direct LDL. She is inquiring about starting ASA. Await labs for anemia first.  6. Atypical chest discomfort - very infrequent, fleeting, 1/x month, associated with anxiety or asthma. Patient feels this is minor and not a salient complaint. EKG compatible with prior. Will f/u echo as planned. Obtain coronary calcium score for risk stratification and continue risk factor modification. Otherwise follow for any progression in symptoms.     Disposition: F/u with Dr. Johney Frame in 6 months.   Medication Adjustments/Labs and Tests Ordered: Current medicines are reviewed at length with the patient today.  Concerns regarding medicines are outlined above. Medication changes, Labs and Tests ordered today are summarized above and listed in the Patient Instructions accessible in Encounters.   Signed, Charlie Pitter, PA-C  07/24/2021 12:49 PM    [facility] [address], [place], [place]  [address] Phone: [phone]; Fax: [phone]

## 2021-07-24 ENCOUNTER — Encounter: Payer: Self-pay | Admitting: Physician Assistant

## 2021-07-24 ENCOUNTER — Ambulatory Visit: Payer: Medicare PPO | Admitting: Physician Assistant

## 2021-07-24 ENCOUNTER — Other Ambulatory Visit: Payer: Self-pay

## 2021-07-24 ENCOUNTER — Ambulatory Visit (HOSPITAL_COMMUNITY)
Admission: RE | Admit: 2021-07-24 | Discharge: 2021-07-24 | Disposition: A | Discharge status: Home / Self Care | Payer: Medicare PPO | Source: Ambulatory Visit | Attending: Vascular Surgery | Admitting: Vascular Surgery

## 2021-07-24 ENCOUNTER — Other Ambulatory Visit: Payer: Self-pay | Admitting: *Deleted

## 2021-07-24 VITALS — BP 120/80 | HR 112 | Ht 67.0 in | Wt 227.6 lb

## 2021-07-24 DIAGNOSIS — I5189 Other ill-defined heart diseases: Secondary | ICD-10-CM

## 2021-07-24 DIAGNOSIS — I872 Venous insufficiency (chronic) (peripheral): Secondary | ICD-10-CM

## 2021-07-24 DIAGNOSIS — I890 Lymphedema, not elsewhere classified: Secondary | ICD-10-CM | POA: Diagnosis not present

## 2021-07-24 DIAGNOSIS — E8809 Other disorders of plasma-protein metabolism, not elsewhere classified: Secondary | ICD-10-CM | POA: Diagnosis not present

## 2021-07-24 DIAGNOSIS — E1169 Type 2 diabetes mellitus with other specified complication: Secondary | ICD-10-CM

## 2021-07-24 DIAGNOSIS — R0789 Other chest pain: Secondary | ICD-10-CM

## 2021-07-24 DIAGNOSIS — L02416 Cutaneous abscess of left lower limb: Secondary | ICD-10-CM | POA: Diagnosis present

## 2021-07-24 DIAGNOSIS — L03116 Cellulitis of left lower limb: Secondary | ICD-10-CM | POA: Diagnosis present

## 2021-07-24 DIAGNOSIS — E669 Obesity, unspecified: Secondary | ICD-10-CM

## 2021-07-24 NOTE — Patient Instructions (Addendum)
Medication Instructions:  Your physician recommends that you continue on your current medications as directed. Please refer to the Current Medication list given to you today.  *If you need a refill on your cardiac medications before your next appointment, please call your pharmacy*   Lab Work: TODAY:  CMET, LIPID, DIRECT LDL, CBC, & TSH   If you have labs (blood work) drawn today and your tests are completely normal, you will receive your results only by: [place] (if you have MyChart) OR A paper copy in the mail If you have any lab test that is abnormal or we need to change your treatment, we will call you to review the results.   Testing/Procedures: Your physician has requested that you have an echocardiogram. Echocardiography is a painless test that uses sound waves to create images of your heart. It provides your doctor with information about the size and shape of your heart and how well your heart's chambers and valves are working. This procedure takes approximately one hour. There are no restrictions for this procedure.  Your physician recommends you have a CT Calcium Score.  This is a self pay of $99.00  Follow-Up: At [facility], you and your health needs are our priority.  As part of our continuing mission to provide you with exceptional heart care, we have created designated Provider Care Teams.  These Care Teams include your primary Cardiologist (physician) and Advanced Practice Providers (APPs -  Physician Assistants and Nurse Practitioners) who all work together to provide you with the care you need, when you need it.  We recommend signing up for the patient portal called "MyChart".  Sign up information is provided on this After Visit Summary.  MyChart is used to connect with patients for Virtual Visits (Telemedicine).  Patients are able to view lab/test results, encounter notes, upcoming appointments, etc.  Non-urgent messages can be sent to your provider as well.    To learn more about what you can do with MyChart, go to [email or web address].    Your next appointment:   6 month(s)  The format for your next appointment:   In Person  Provider:   Gwyndolyn Kaufman, MD   Other Instructions  For patients with history of fluid retention, we give them these special instructions:  1. Follow a low-salt diet - you should be eating no more than 2,000mg  of sodium per day. This does not necessarily just apply to the salt you put on top of prepared food, but the sodium already in food. Processed food, frozen meals, canned goods, deli meat, and bread can have a surprising amount of sodium per serving so be sure to track this daily. 2. Watch your fluid intake. In general, you should not be taking in more than 2 liters of fluid per day (close to 64 oz of fluid per day). This includes sources of water in foods like soup, coffee, tea, milk, etc. It's important to stay hydrated but NOT to excess. 2. Weigh yourself on the same scale at same time of day and keep a log. 3. Call your doctor: (Anytime you feel any of the following symptoms)  - 3lb weight gain overnight or 5lb within a few days - Shortness of breath, with or without a dry hacking cough  - Swelling in the hands, feet or stomach  - If you have to sleep on extra pillows at night in order to breathe   IT IS IMPORTANT TO LET YOUR DOCTOR KNOW EARLY ON IF YOU ARE HAVING  SYMPTOMS SO WE CAN HELP YOU!

## 2021-07-25 LAB — COMPREHENSIVE METABOLIC PANEL
ALT: 8 IU/L (ref 0–32)
AST: 12 IU/L (ref 0–40)
Albumin/Globulin Ratio: 1.1 — ABNORMAL LOW (ref 1.2–2.2)
Albumin: 4.1 g/dL (ref 3.8–4.8)
Alkaline Phosphatase: 143 IU/L — ABNORMAL HIGH (ref 44–121)
BUN/Creatinine Ratio: 15 (ref 12–28)
BUN: 14 mg/dL (ref 8–27)
Bilirubin Total: 0.3 mg/dL (ref 0.0–1.2)
CO2: 27 mmol/L (ref 20–29)
Calcium: 10.4 mg/dL — ABNORMAL HIGH (ref 8.7–10.3)
Chloride: 100 mmol/L (ref 96–106)
Creatinine, Ser: 0.96 mg/dL (ref 0.57–1.00)
Globulin, Total: 3.8 g/dL (ref 1.5–4.5)
Glucose: 128 mg/dL — ABNORMAL HIGH (ref 70–99)
Potassium: 4.4 mmol/L (ref 3.5–5.2)
Sodium: 140 mmol/L (ref 134–144)
Total Protein: 7.9 g/dL (ref 6.0–8.5)
eGFR: 64 mL/min/{1.73_m2} (ref >59)

## 2021-07-25 LAB — CBC
Hematocrit: 36.7 % (ref 34.0–46.6)
Hemoglobin: 11.6 g/dL (ref 11.1–15.9)
MCH: 25.7 pg — ABNORMAL LOW (ref 26.6–33.0)
MCHC: 31.6 g/dL (ref 31.5–35.7)
MCV: 81 fL (ref 79–97)
Platelets: 382 10*3/uL (ref 150–450)
RBC: 4.51 x10E6/uL (ref 3.77–5.28)
RDW: 14.8 % (ref 11.7–15.4)
WBC: 9.4 10*3/uL (ref 3.4–10.8)

## 2021-07-25 LAB — LDL CHOLESTEROL, DIRECT: LDL Direct: 71 mg/dL (ref 0–99)

## 2021-07-25 LAB — LIPID PANEL
Chol/HDL Ratio: 2.2 ratio (ref 0.0–4.4)
Cholesterol, Total: 150 mg/dL (ref 100–199)
HDL: 69 mg/dL (ref >39)
LDL Chol Calc (NIH): 70 mg/dL (ref 0–99)
Triglycerides: 51 mg/dL (ref 0–149)
VLDL Cholesterol Cal: 11 mg/dL (ref 5–40)

## 2021-07-25 LAB — TSH: TSH: 0.988 u[IU]/mL (ref 0.450–4.500)

## 2021-07-28 NOTE — Addendum Note (Signed)
Addended by: Gaetano Net on: 07/28/2021 12:01 PM   Modules accepted: Orders

## 2021-07-29 ENCOUNTER — Telehealth: Payer: Self-pay | Admitting: *Deleted

## 2021-07-29 MED ORDER — ASPIRIN EC 81 MG PO TBEC: 81.0000 mg | DELAYED_RELEASE_TABLET | Freq: Every day | ORAL | 3 refills | Status: DC

## 2021-07-29 NOTE — Telephone Encounter (Signed)
Returned the call to the pt, all of her questions, if any, have been answered.  Pt was thankful for the call.

## 2021-07-29 NOTE — Telephone Encounter (Signed)
-----   Message from Charlie Pitter, [place] sent at 07/27/2021  7:55 AM EST ----- Please let patient know labs all are stable, cholesterol level at goal but will await coronary calcium score before advising on any dose increase. Her labs do show some minor abnormalities slightly above the normal range to f/u primary care - mildly elevated calcium level and alkaline phosphatase level. OK to start a baby ASA 81mg  daily as long as no history of bleeding issues/GI bleed or brain bleeds.

## 2021-07-29 NOTE — Telephone Encounter (Signed)
Patient is following up. She states she would like to further discuss her calcium level and she would also like to know if she needs to start taking Vitamin D. Please advise.

## 2021-07-30 ENCOUNTER — Other Ambulatory Visit: Payer: Self-pay

## 2021-07-30 ENCOUNTER — Encounter: Payer: Self-pay | Admitting: Vascular Surgery

## 2021-07-30 ENCOUNTER — Ambulatory Visit: Payer: Medicare PPO | Admitting: Vascular Surgery

## 2021-07-30 VITALS — BP 128/81 | HR 100 | Temp 98.0°F | Resp 20 | Ht 67.0 in | Wt 227.0 lb

## 2021-07-30 DIAGNOSIS — I890 Lymphedema, not elsewhere classified: Secondary | ICD-10-CM

## 2021-07-30 NOTE — Progress Notes (Signed)
REASON FOR VISIT:   Follow-up of lymphedema.  MEDICAL ISSUES:   COMBINED CHRONIC VENOUS INSUFFICIENCY AND LYMPHEDEMA: Patient swelling is essentially unchanged from when I saw her 2 years ago.  She does have a lymphedema press but admits that she has not been using this.  I have encouraged her to try to use it for 2 hours a day.  In addition she states that she does not elevate her legs every day like she knows she should.  I have again emphasized the importance of elevating her legs daily and we have on multiple times discussed the proper positioning for this.  I have encouraged her to stay as active as possible and avoid prolonged sitting and standing.  I reassured her that based on her noninvasive study done on 07/24/2021 currently she is not a candidate for any type of venous procedures such as laser ablation.  I will plan on seeing her back in 1 year and given that the symptoms are more severe on the left side we will repeat her reflux study on the left in 1 year.  She knows to call sooner if she has problems.  I have also encouraged her to keep her skin well lubricated.  I have also encouraged her to wear her compression stockings as much as possible when she is up during the day.  HPI:   Jessica Yates is a pleasant 70 y.o. female with a history of lymphedema.  I last saw her on 06/14/2019.  I have some photographs of her legs when I last saw her on 06/14/2019.  She comes in today because she had some cellulitis in the left leg.  She was on doxycycline for a week and this has resolved.  Her swelling in both legs is really not changed significantly since I saw her 2 years ago.  She has a lymphedema pump but she does not use this routinely.  She has not been elevating her legs daily.  She does have some compression stockings which she does wear at times.  Past Medical History:  Diagnosis Date   Allergic rhinitis    Anemia 2002   Anxiety    Arthritis    Asthma    Blood clot in vein     Cellulitis    Cellulitis of right lower extremity    Chronic diastolic (congestive) heart failure (HCC)    Chronic kidney disease, stage 3 (HCC)    Coagulation defect (HCC)    Cough    Depression 02/06/2008   Diabetes mellitus, type 2 (HCC)    DVT (deep venous thrombosis) (HCC) 2002   left leg   Dysphagia    Elevated hemoglobin A1c    GERD (gastroesophageal reflux disease)    Glaucoma    Holosystolic murmur    Kidney cysts    Leukocytosis    Liver cyst    Lymphedema    Morbid obesity (HCC)    Overactive bladder    Palpitations    Peripheral venous insufficiency    Primary open angle glaucoma (POAG) of both eyes, mild stage    Seasonal allergies    Sickle cell trait (HCC)    Tortuous aorta (HCC)    Unstable gait    Upper airway cough syndrome    Vitamin D deficiency     Family History  Problem Relation Age of Onset   Allergies Mother    Asthma Mother    Other Mother        enlarged heart  Arthritis Mother    Breast cancer Mother    Diabetes Mother    Hypertension Mother    Cancer Mother        breast   Hyperlipidemia Mother    Heart disease Maternal Grandmother    Breast cancer Maternal Grandmother    Birth defects Maternal Grandmother        breast   Cancer Father 2       leukemia   Allergies Sister    Arthritis Sister     SOCIAL HISTORY: Social History   Tobacco Use   Smoking status: Never   Smokeless tobacco: Never  Substance Use Topics   Alcohol use: No    No Known Allergies  Current Outpatient Medications  Medication Sig Dispense Refill   acetaminophen (TYLENOL) 500 MG tablet Take 1,000 mg by mouth every 6 (six) hours as needed for mild pain.      albuterol (VENTOLIN HFA) 108 (90 Base) MCG/ACT inhaler      aspirin EC 81 MG tablet Take 1 tablet (81 mg total) by mouth daily. Swallow whole. 90 tablet 3   atorvastatin (LIPITOR) 10 MG tablet Take 10 mg by mouth at bedtime.     blood glucose meter kit and supplies KIT Dispense based on  patient and insurance preference. Use up to four times daily as directed. (FOR ICD-9 250.00, 250.01). 1 each 0   Cholecalciferol (VITAMIN D3) 25 MCG (1000 UT) CAPS Take 1 capsule (1,000 Units total) by mouth daily. 30 capsule 0   famotidine (PEPCID) 40 MG tablet Take 1 tablet (40 mg total) by mouth at bedtime. 30 tablet 11   fluticasone (FLONASE) 50 MCG/ACT nasal spray Place 1-2 sprays into both nostrils daily as needed for allergies or rhinitis.      furosemide (LASIX) 40 MG tablet $RemoveB'40mg'apIELjEl$  twice daily for 5 days, then $RemoveBe'40mg'BWlAgVDHz$  once daily. 60 tablet 0   glipiZIDE (GLUCOTROL XL) 2.5 MG 24 hr tablet Take 2.5 mg by mouth every morning.     potassium chloride SA (K-DUR,KLOR-CON) 20 MEQ tablet Take 20 mEq by mouth daily.      RABEprazole (ACIPHEX) 20 MG tablet Take 1 tablet (20 mg total) by mouth 2 (two) times daily. Take one tablet before breakfast, and one tablet before dinner 60 tablet 6   timolol (TIMOPTIC) 0.5 % ophthalmic solution 1 drop 2 (two) times daily.     TRAVATAN Z 0.004 % SOLN ophthalmic solution Place 1 drop into both eyes at bedtime.      No current facility-administered medications for this visit.    REVIEW OF SYSTEMS:  $RemoveB'[X]'jkPNmtFn$  denotes positive finding, $RemoveBeforeDEI'[ ]'zMBDGsSguoZgIRLH$  denotes negative finding Cardiac  Comments:  Chest pain or chest pressure:    Shortness of breath upon exertion:    Short of breath when lying flat:    Irregular heart rhythm:        Vascular    Pain in calf, thigh, or hip brought on by ambulation: x   Pain in feet at night that wakes you up from your sleep:     Blood clot in your veins:    Leg swelling:  x       Pulmonary    Oxygen at home:    Productive cough:     Wheezing:         Neurologic    Sudden weakness in arms or legs:     Sudden numbness in arms or legs:     Sudden onset of difficulty speaking or slurred speech:  Temporary loss of vision in one eye:     Problems with dizziness:         Gastrointestinal    Blood in stool:     Vomited blood:          Genitourinary    Burning when urinating:     Blood in urine:        Psychiatric    Major depression:         Hematologic    Bleeding problems:    Problems with blood clotting too easily:        Skin    Rashes or ulcers: x       Constitutional    Fever or chills:     PHYSICAL EXAM:   Vitals:   07/30/21 0927  BP: 128/81  Pulse: 100  Resp: 20  Temp: 98 F (36.7 C)  SpO2: 95%  Weight: 227 lb (103 kg)  Height: $Remove'5\' 7"'KpavOyA$  (1.702 m)    GENERAL: The patient is a well-nourished female, in no acute distress. The vital signs are documented above. CARDIAC: There is a regular rate and rhythm.  VASCULAR: I do not detect carotid bruits. She has a brisk dorsalis pedis and posterior tibial signal bilaterally with a Doppler. She has significant bilateral lower extremity swelling with hyperkeratosis, lipodermatosclerosis, and hyperpigmentation.     PULMONARY: There is good air exchange bilaterally without wheezing or rales. ABDOMEN: Soft and non-tender with normal pitched bowel sounds.  MUSCULOSKELETAL: There are no major deformities or cyanosis. NEUROLOGIC: No focal weakness or paresthesias are detected. SKIN: There are no ulcers or rashes noted. PSYCHIATRIC: The patient has a normal affect.  DATA:    VENOUS DUPLEX: I have independently interpreted the venous duplex scan that was done on 07/24/2021.  On the right side there was no evidence of DVT.  There is no deep venous reflux.  There was superficial venous reflux in the right great saphenous vein from the mid thigh to the proximal calf.  Diameters of the vein ranged from 2.5 to 6 mm.  On the left side there was no evidence of DVT.  There is no deep venous reflux.  There was just a couple areas of reflux in the great saphenous vein in the distal thigh and proximal calf.  Otherwise the great saphenous was competent.  Deitra Mayo [facility] [phone]

## 2021-08-19 ENCOUNTER — Other Ambulatory Visit (HOSPITAL_COMMUNITY): Payer: Medicare PPO

## 2021-08-19 ENCOUNTER — Other Ambulatory Visit: Payer: Medicare PPO

## 2021-08-26 ENCOUNTER — Ambulatory Visit (HOSPITAL_COMMUNITY): Payer: Medicare PPO | Attending: Cardiology

## 2021-08-26 ENCOUNTER — Inpatient Hospital Stay: Admission: RE | Admit: 2021-08-26 | Payer: Self-pay | Source: Ambulatory Visit

## 2021-08-26 ENCOUNTER — Other Ambulatory Visit: Payer: Self-pay

## 2021-08-26 ENCOUNTER — Ambulatory Visit (INDEPENDENT_AMBULATORY_CARE_PROVIDER_SITE_OTHER)
Admission: RE | Admit: 2021-08-26 | Discharge: 2021-08-26 | Disposition: A | Discharge status: Home / Self Care | Payer: Self-pay | Source: Ambulatory Visit | Attending: Physician Assistant | Admitting: Physician Assistant

## 2021-08-26 DIAGNOSIS — I5189 Other ill-defined heart diseases: Secondary | ICD-10-CM

## 2021-08-26 DIAGNOSIS — I872 Venous insufficiency (chronic) (peripheral): Secondary | ICD-10-CM

## 2021-08-31 ENCOUNTER — Encounter (HOSPITAL_COMMUNITY): Payer: Self-pay

## 2021-08-31 ENCOUNTER — Emergency Department (HOSPITAL_COMMUNITY)
Admission: EM | Admit: 2021-08-31 | Discharge: 2021-09-01 | Disposition: A | Discharge status: Home / Self Care | Payer: Medicare PPO | Attending: Emergency Medicine | Admitting: Emergency Medicine

## 2021-08-31 ENCOUNTER — Other Ambulatory Visit: Payer: Self-pay

## 2021-08-31 ENCOUNTER — Ambulatory Visit (HOSPITAL_COMMUNITY)
Admission: EM | Admit: 2021-08-31 | Discharge: 2021-08-31 | Disposition: A | Discharge status: Home / Self Care | Payer: Medicare PPO

## 2021-08-31 ENCOUNTER — Encounter (HOSPITAL_COMMUNITY): Payer: Self-pay | Admitting: Emergency Medicine

## 2021-08-31 DIAGNOSIS — I890 Lymphedema, not elsewhere classified: Secondary | ICD-10-CM

## 2021-08-31 DIAGNOSIS — I5032 Chronic diastolic (congestive) heart failure: Secondary | ICD-10-CM | POA: Insufficient documentation

## 2021-08-31 DIAGNOSIS — Z7982 Long term (current) use of aspirin: Secondary | ICD-10-CM | POA: Insufficient documentation

## 2021-08-31 DIAGNOSIS — L03115 Cellulitis of right lower limb: Secondary | ICD-10-CM | POA: Diagnosis not present

## 2021-08-31 DIAGNOSIS — Z7984 Long term (current) use of oral hypoglycemic drugs: Secondary | ICD-10-CM | POA: Diagnosis not present

## 2021-08-31 DIAGNOSIS — N183 Chronic kidney disease, stage 3 unspecified: Secondary | ICD-10-CM | POA: Insufficient documentation

## 2021-08-31 DIAGNOSIS — E11628 Type 2 diabetes mellitus with other skin complications: Secondary | ICD-10-CM | POA: Diagnosis not present

## 2021-08-31 DIAGNOSIS — I130 Hypertensive heart and chronic kidney disease with heart failure and stage 1 through stage 4 chronic kidney disease, or unspecified chronic kidney disease: Secondary | ICD-10-CM | POA: Insufficient documentation

## 2021-08-31 DIAGNOSIS — E1122 Type 2 diabetes mellitus with diabetic chronic kidney disease: Secondary | ICD-10-CM | POA: Insufficient documentation

## 2021-08-31 DIAGNOSIS — M79604 Pain in right leg: Secondary | ICD-10-CM | POA: Diagnosis present

## 2021-08-31 DIAGNOSIS — Z20822 Contact with and (suspected) exposure to covid-19: Secondary | ICD-10-CM | POA: Diagnosis not present

## 2021-08-31 DIAGNOSIS — Z79899 Other long term (current) drug therapy: Secondary | ICD-10-CM | POA: Diagnosis not present

## 2021-08-31 DIAGNOSIS — E1159 Type 2 diabetes mellitus with other circulatory complications: Secondary | ICD-10-CM

## 2021-08-31 DIAGNOSIS — J45909 Unspecified asthma, uncomplicated: Secondary | ICD-10-CM | POA: Diagnosis not present

## 2021-08-31 DIAGNOSIS — R509 Fever, unspecified: Secondary | ICD-10-CM

## 2021-08-31 NOTE — ED Notes (Signed)
Patient is being discharged from the Urgent Care and sent to the Emergency Department via pov . Per Geryl Councilman, PA, patient is in need of higher level of care due to history of dvt, cellulitis, concern for sepsis. Patient is aware and verbalizes understanding of plan of care.  Vitals:   08/31/21 2007 08/31/21 2011  BP: (!) 156/85 (!) 151/83  Pulse: (!) 105 (!) 106  Resp: 20   Temp: 99.7 F (37.6 C)   SpO2: 100%

## 2021-08-31 NOTE — ED Triage Notes (Signed)
Patient seen at [facility] and sent to ED for possible sepsis. Pt states she has been having right leg swelling, states it is warm to the touch and painful. Patient has low grade fever upon triage, denies any other symptoms.

## 2021-08-31 NOTE — ED Provider Notes (Signed)
[place]    CSN: [id number] Arrival date & time: 08/31/21  1857      History   Chief Complaint Chief Complaint  Patient presents with   Fever    HPI Jessica Yates is a 70 y.o. female.   Pleasant 70 year old female presents with an acute onset of right lower extremity erythema, warmth, and leg pain.  She has a longstanding history of lymphedema since age 36.  She follows with vascular and actually just saw them last month in which she was stable at that time.  He has a history of a DVT in her left leg, in 2002.  She is no longer on anticoagulation.  She does have a history of diabetes.  She states that her pain and erythema started yesterday in which she also had a fever of 102.7.  She states her leg hurts all the way up into her mid thigh on the right.   Fever Associated symptoms: myalgias    Past Medical History:  Diagnosis Date   Allergic rhinitis    Anemia 2002   Anxiety    Arthritis    Asthma    Blood clot in vein    Cellulitis    Cellulitis of right lower extremity    Chronic diastolic (congestive) heart failure (HCC)    Chronic kidney disease, stage 3 (HCC)    Coagulation defect (HCC)    Cough    Depression 02/06/2008   Diabetes mellitus, type 2 (HCC)    DVT (deep venous thrombosis) ([place]) 2002   left leg   Dysphagia    Elevated hemoglobin A1c    GERD (gastroesophageal reflux disease)    Glaucoma    Holosystolic murmur    Kidney cysts    Leukocytosis    Liver cyst    Lymphedema    Morbid obesity ([place])    Overactive bladder    Palpitations    Peripheral venous insufficiency    Primary open angle glaucoma (POAG) of both eyes, mild stage    Seasonal allergies    Sickle cell trait ([place])    Tortuous aorta (HCC)    Unstable gait    Upper airway cough syndrome    Vitamin D deficiency     Patient Active Problem List   Diagnosis Date Noted   Osteoarthritis of left knee 04/25/2020   Cellulitis 05/13/2019   Cellulitis of right lower  extremity 05/12/2019   CKD (chronic kidney disease) stage 3, GFR 30-59 ml/min (HCC) 05/12/2019   Normochromic normocytic anemia 05/12/2019   Cellulitis, leg 05/12/2019   Impacted cerumen of left ear 02/22/2019   Laryngopharyngeal reflux (LPR) 02/22/2019   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   Pain in left knee 11/13/2018   Cellulitis and abscess of left leg 07/22/2018   Primary open angle glaucoma of both eyes, mild stage 05/08/2018   Cough 05/07/2016   Upper airway cough syndrome 05/07/2016   Sebaceous cyst of labia 04/19/2016   OAB (overactive bladder) 04/19/2016   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   Lymphedema 05/03/2012   CHF (congestive heart failure) ([place]) 04/16/2011   SICKLE-CELL TRAIT 08/01/2007   UNSPECIFIED GLAUCOMA 08/01/2007   DVT 08/01/2007   VENOUS INSUFFICIENCY 08/01/2007   ALLERGIC RHINITIS 08/01/2007   Extrinsic asthma 08/01/2007    Past Surgical History:  Procedure Laterality Date   COSMETIC SURGERY  1975   nasal reconstruction   TOTAL ABDOMINAL HYSTERECTOMY  07/2001    OB History     Gravida  0  Para  0   Term  0   Preterm  0   AB  0   Living  0      SAB  0   IAB  0   Ectopic  0   Multiple  0   Live Births  0            Home Medications    Prior to Admission medications   Medication Sig Start Date End Date Taking? Authorizing Provider  acetaminophen (TYLENOL) 500 MG tablet Take 1,000 mg by mouth every 6 (six) hours as needed for mild pain.     [provider]  albuterol (VENTOLIN HFA) 108 (90 Base) MCG/ACT inhaler  06/11/20   [provider]  aspirin EC 81 MG tablet Take 1 tablet (81 mg total) by mouth daily. Swallow whole. 07/29/21   Dunn, Nedra Hai, PA-C  atorvastatin (LIPITOR) 10 MG tablet Take 10 mg by mouth at bedtime.    [provider]  blood glucose meter kit and supplies KIT Dispense based on patient and insurance preference. Use up to four times daily as directed.  (FOR ICD-9 250.00, 250.01). 05/20/19   Kayleen Memos, DO  Cholecalciferol (VITAMIN D3) 25 MCG (1000 UT) CAPS Take 1 capsule (1,000 Units total) by mouth daily. 05/20/19   Kayleen Memos, DO  famotidine (PEPCID) 40 MG tablet Take 1 tablet (40 mg total) by mouth at bedtime. 01/21/21   Esterwood, Amy S, PA-C  fluticasone (FLONASE) 50 MCG/ACT nasal spray Place 1-2 sprays into both nostrils daily as needed for allergies or rhinitis.  07/25/19   [provider]  furosemide (LASIX) 40 MG tablet 67m twice daily for 5 days, then 49monce daily. 07/28/18   KrBonnielee HaffMD  glipiZIDE (GLUCOTROL XL) 2.5 MG 24 hr tablet Take 2.5 mg by mouth every morning. 06/04/21   [provider]  potassium chloride SA (K-DUR,KLOR-CON) 20 MEQ tablet Take 20 mEq by mouth daily.  04/27/16   [provider]  RABEprazole (ACIPHEX) 20 MG tablet Take 1 tablet (20 mg total) by mouth 2 (two) times daily. Take one tablet before breakfast, and one tablet before dinner 02/27/21   Esterwood, Amy S, PA-C  timolol (TIMOPTIC) 0.5 % ophthalmic solution 1 drop 2 (two) times daily. 07/16/21   [provider]  TRAVATAN Z 0.004 % SOLN ophthalmic solution Place 1 drop into both eyes at bedtime.  03/14/11   [provider]  metFORMIN (GLUCOPHAGE) 500 MG tablet Take 1 tablet (500 mg total) by mouth 2 (two) times daily with a meal. Patient not taking: No sig reported 05/20/19 08/02/19  HaKayleen MemosDO    Family History Family History  Problem Relation Age of Onset   Allergies Mother    Asthma Mother    Other Mother        enlarged heart   Arthritis Mother    Breast cancer Mother    Diabetes Mother    Hypertension Mother    Cancer Mother        breast   Hyperlipidemia Mother    Heart disease Maternal Grandmother    Breast cancer Maternal Grandmother    Birth defects Maternal Grandmother        breast   Cancer Father 505     leukemia   Allergies Sister    Arthritis Sister     Social  History Social History   Tobacco Use   Smoking status: Never   Smokeless tobacco:  Never  Vaping Use   Vaping Use: Never used  Substance Use Topics   Alcohol use: No   Drug use: No     Allergies   Patient has no known allergies.   Review of Systems Review of Systems  Constitutional:  Positive for fever.  Musculoskeletal:  Positive for arthralgias, joint swelling and myalgias.  Skin:  Positive for color change.    Physical Exam Triage Vital Signs ED Triage Vitals  Enc Vitals Group     BP 08/31/21 2007 (!) 156/85     Pulse Rate 08/31/21 2007 (!) 105     Resp 08/31/21 2007 20     Temp 08/31/21 2007 99.7 F (37.6 C)     Temp Source 08/31/21 2007 Oral     SpO2 08/31/21 2007 100 %     Weight --      Height --      Head Circumference --      Peak Flow --      Pain Score 08/31/21 2004 7     Pain Loc --      Pain Edu? --      Excl. in [place]? --    No data found.  Updated Vital Signs BP (!) 151/83 (BP Location: Left Arm)    Pulse (!) 106    Temp 99.7 F (37.6 C) (Oral)    Resp 20    SpO2 100%   Visual Acuity Right Eye Distance:   Left Eye Distance:   Bilateral Distance:    Right Eye Near:   Left Eye Near:    Bilateral Near:     Physical Exam Vitals and nursing note reviewed.  Constitutional:      Appearance: She is obese. She is ill-appearing.  HENT:     Head: Normocephalic.  Skin:    General: Skin is dry.     Findings: Erythema (Significant erythema and warmth to her entire right lower extremity distal to knee.  Areas of lymph angiitis noted to the superior aspect of knee laterally.  Extreme pain and tenderness to palpation of her thigh) and rash present.  Neurological:     Mental Status: She is alert.     Gait: Gait abnormal.     UC Treatments / Results  Labs (all labs ordered are listed, but only abnormal results are displayed) Labs Reviewed - No data to display  EKG   Radiology No results found.  Procedures Procedures (including critical  care time)  Medications Ordered in UC Medications - No data to display  Initial Impression / Assessment and Plan / UC Course  I have reviewed the triage vital signs and the nursing notes.  Pertinent labs & imaging results that were available during my care of the patient were reviewed by me and considered in my medical decision making (see chart for details).     Cellulitis-patient admits to having been hospitalized on IV antibiotics several times for this in the past.  Given her systemic symptoms and risk factors, patient recommended to head to the emergency room.  Suspect IV antibiotics will be warranted, labs, and possible ultrasound imaging. Lymphedema-chronic, but worsening primarily in the right Diabetes mellitus-complicating #1.  This also puts her at risk of recurrent DVT.  ER for work-up Fever-concern for possible sepsis or bacteremia.  Sent to ER for work-up.  Patient understands failure to comply with medical recommendations could result in significant increased risk of mortality or a poor outcome.   Final Clinical Impressions(s) / UC Diagnoses  Final diagnoses:  Cellulitis of right lower extremity  Lymphedema  Type 2 diabetes mellitus with other circulatory complication, without long-term current use of insulin (HCC)  Fever, unspecified     Discharge Instructions      Please go straight to the Emergency Room. Given the fever and developing lymphangitis, blood work to rule out sepsis is required and a lower extremity venous doppler would be recommended in light of your pain with hx of DVT. Outpatient therapy is not recommended.   ED Prescriptions   None    PDMP not reviewed this encounter.   Chaney Malling, [place] 08/31/21 2110

## 2021-08-31 NOTE — ED Triage Notes (Addendum)
Noticed a fever Friday morning.  100-101, down to 98.  Denies runny nose, cough.  Friday or Saturday noticed soreness in right lower leg.  Patient has had a history of this and reports a diagnosis of cellulitis.    Right lower leg is red, warm to touch, swollen and painful

## 2021-08-31 NOTE — Discharge Instructions (Signed)
Please go straight to the Emergency Room. Given the fever and developing lymphangitis, blood work to rule out sepsis is required and a lower extremity venous doppler would be recommended in light of your pain with hx of DVT. Outpatient therapy is not recommended.

## 2021-08-31 NOTE — ED Provider Notes (Signed)
Emergency Medicine Provider Triage Evaluation Note  Jessica Yates , a 70 y.o. female  was evaluated in triage.  Pt complains of right leg pain and swelling.  Pt reports she went to Urgent care because she thought she had cellulitis in her leg.  Pt sent here for evaluation of possible sepsis and cellulitis   Review of Systems  Positive: fever Negative: Cough   Physical Exam  BP (!) 154/95 (BP Location: Left Arm)    Pulse (!) 108    Resp 15    Ht 5\' 7"  (1.702 m)    SpO2 99%    BMI 35.55 kg/m  Gen:   Awake, no distress   Resp:  Normal effort  MSK:   Moves extremities without difficulty  Other:    Medical Decision Making  Medically screening exam initiated at 11:17 PM.  Appropriate orders placed.  [name] was informed that the remainder of the evaluation will be completed by another provider, this initial triage assessment does not replace that evaluation, and the importance of remaining in the ED until their evaluation is complete.     Fransico Meadow, PA-C 08/31/21 2319    Daleen Bo, MD 09/01/21 [phone]

## 2021-09-01 ENCOUNTER — Ambulatory Visit (HOSPITAL_COMMUNITY): Admission: RE | Admit: 2021-09-01 | Payer: Medicare PPO | Source: Ambulatory Visit

## 2021-09-01 LAB — CBC WITH DIFFERENTIAL/PLATELET
Abs Immature Granulocytes: 0.03 10*3/uL (ref 0.00–0.07)
Basophils Absolute: 0 10*3/uL (ref 0.0–0.1)
Basophils Relative: 0 %
Eosinophils Absolute: 0.1 10*3/uL (ref 0.0–0.5)
Eosinophils Relative: 1 %
HCT: 32.5 % — ABNORMAL LOW (ref 36.0–46.0)
Hemoglobin: 10.3 g/dL — ABNORMAL LOW (ref 12.0–15.0)
Immature Granulocytes: 0 %
Lymphocytes Relative: 10 %
Lymphs Abs: 0.9 10*3/uL (ref 0.7–4.0)
MCH: 25.8 pg — ABNORMAL LOW (ref 26.0–34.0)
MCHC: 31.7 g/dL (ref 30.0–36.0)
MCV: 81.3 fL (ref 80.0–100.0)
Monocytes Absolute: 0.7 10*3/uL (ref 0.1–1.0)
Monocytes Relative: 8 %
Neutro Abs: 7.6 10*3/uL (ref 1.7–7.7)
Neutrophils Relative %: 81 %
Platelets: 295 10*3/uL (ref 150–400)
RBC: 4 MIL/uL (ref 3.87–5.11)
RDW: 16.5 % — ABNORMAL HIGH (ref 11.5–15.5)
WBC: 9.3 10*3/uL (ref 4.0–10.5)
nRBC: 0 % (ref 0.0–0.2)

## 2021-09-01 LAB — COMPREHENSIVE METABOLIC PANEL
ALT: 15 U/L (ref 0–44)
AST: 14 U/L — ABNORMAL LOW (ref 15–41)
Albumin: 3.3 g/dL — ABNORMAL LOW (ref 3.5–5.0)
Alkaline Phosphatase: 88 U/L (ref 38–126)
Anion gap: 4 — ABNORMAL LOW (ref 5–15)
BUN: 9 mg/dL (ref 8–23)
CO2: 25 mmol/L (ref 22–32)
Calcium: 9.6 mg/dL (ref 8.9–10.3)
Chloride: 106 mmol/L (ref 98–111)
Creatinine, Ser: 0.9 mg/dL (ref 0.44–1.00)
GFR, Estimated: >60 mL/min (ref >60)
Glucose, Bld: 129 mg/dL — ABNORMAL HIGH (ref 70–99)
Potassium: 4.1 mmol/L (ref 3.5–5.1)
Sodium: 135 mmol/L (ref 135–145)
Total Bilirubin: 0.5 mg/dL (ref 0.3–1.2)
Total Protein: 8 g/dL (ref 6.5–8.1)

## 2021-09-01 LAB — RESP PANEL BY RT-PCR (FLU A&B, COVID) ARPGX2
Influenza A by PCR: NEGATIVE
Influenza B by PCR: NEGATIVE
SARS Coronavirus 2 by RT PCR: NEGATIVE

## 2021-09-01 MED ORDER — DOXYCYCLINE HYCLATE 100 MG PO CAPS: 100.0000 mg | ORAL_CAPSULE | Freq: Two times a day (BID) | ORAL | 0 refills | Status: DC

## 2021-09-01 MED ORDER — DOXYCYCLINE HYCLATE 100 MG PO TABS
100.0000 mg | ORAL_TABLET | Freq: Once | ORAL | Status: AC
  Administered 2021-09-01: 05:00:00 100 mg via ORAL
  Filled 2021-09-01: qty 1

## 2021-09-01 NOTE — ED Notes (Signed)
Attempted to complete labs x2. No Success.

## 2021-09-01 NOTE — ED Provider Notes (Signed)
[facility] DEPT Provider Note   CSN: [id number] Arrival date & time: 08/31/21  2301     History Chief Complaint  Patient presents with   Leg Pain   Possible Sepsis     Jessica Yates is a 70 y.o. female.  The history is provided by the patient.  Leg Pain Location:  Leg Time since incident:  3 days Injury: no   Leg location:  R lower leg Pain details:    Quality:  Aching and throbbing   Radiates to: radiates to the inner thigh.   Severity:  Moderate   Onset quality:  Gradual   Duration:  3 days   Timing:  Constant   Progression:  Worsening Chronicity:  Recurrent Relieved by:  None tried Worsened by:  Activity Ineffective treatments: has had 3 doses of doxycyline. Associated symptoms: fever and swelling   Risk factors comment:  Hx of DVT in left leg, lymphedema, recurrent cellulitis, ckd, dm, pvd     Past Medical History:  Diagnosis Date   Allergic rhinitis    Anemia 2002   Anxiety    Arthritis    Asthma    Blood clot in vein    Cellulitis    Cellulitis of right lower extremity    Chronic diastolic (congestive) heart failure (HCC)    Chronic kidney disease, stage 3 (HCC)    Coagulation defect (HCC)    Cough    Depression 02/06/2008   Diabetes mellitus, type 2 (HCC)    DVT (deep venous thrombosis) ([place]) 2002   left leg   Dysphagia    Elevated hemoglobin A1c    GERD (gastroesophageal reflux disease)    Glaucoma    Holosystolic murmur    Kidney cysts    Leukocytosis    Liver cyst    Lymphedema    Morbid obesity ([place])    Overactive bladder    Palpitations    Peripheral venous insufficiency    Primary open angle glaucoma (POAG) of both eyes, mild stage    Seasonal allergies    Sickle cell trait ([place])    Tortuous aorta (HCC)    Unstable gait    Upper airway cough syndrome    Vitamin D deficiency     Patient Active Problem List   Diagnosis Date Noted   Osteoarthritis of left knee 04/25/2020   Cellulitis  05/13/2019   Cellulitis of right lower extremity 05/12/2019   CKD (chronic kidney disease) stage 3, GFR 30-59 ml/min (HCC) 05/12/2019   Normochromic normocytic anemia 05/12/2019   Cellulitis, leg 05/12/2019   Impacted cerumen of left ear 02/22/2019   Laryngopharyngeal reflux (LPR) 02/22/2019   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   Pain in left knee 11/13/2018   Cellulitis and abscess of left leg 07/22/2018   Primary open angle glaucoma of both eyes, mild stage 05/08/2018   Cough 05/07/2016   Upper airway cough syndrome 05/07/2016   Sebaceous cyst of labia 04/19/2016   OAB (overactive bladder) 04/19/2016   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   Lymphedema 05/03/2012   CHF (congestive heart failure) ([place]) 04/16/2011   SICKLE-CELL TRAIT 08/01/2007   UNSPECIFIED GLAUCOMA 08/01/2007   DVT 08/01/2007   VENOUS INSUFFICIENCY 08/01/2007   ALLERGIC RHINITIS 08/01/2007   Extrinsic asthma 08/01/2007    Past Surgical History:  Procedure Laterality Date   COSMETIC SURGERY  1975   nasal reconstruction   TOTAL ABDOMINAL HYSTERECTOMY  07/2001     OB History  Gravida  0   Para  0   Term  0   Preterm  0   AB  0   Living  0      SAB  0   IAB  0   Ectopic  0   Multiple  0   Live Births  0           Family History  Problem Relation Age of Onset   Allergies Mother    Asthma Mother    Other Mother        enlarged heart   Arthritis Mother    Breast cancer Mother    Diabetes Mother    Hypertension Mother    Cancer Mother        breast   Hyperlipidemia Mother    Heart disease Maternal Grandmother    Breast cancer Maternal Grandmother    Birth defects Maternal Grandmother        breast   Cancer Father 54       leukemia   Allergies Sister    Arthritis Sister     Social History   Tobacco Use   Smoking status: Never   Smokeless tobacco: Never  Vaping Use   Vaping Use: Never used  Substance Use Topics   Alcohol use: No    Drug use: No    Home Medications Prior to Admission medications   Medication Sig Start Date End Date Taking? Authorizing Provider  doxycycline (VIBRAMYCIN) 100 MG capsule Take 1 capsule (100 mg total) by mouth 2 (two) times daily. 09/01/21  Yes Blanchie Dessert, MD  acetaminophen (TYLENOL) 500 MG tablet Take 1,000 mg by mouth every 6 (six) hours as needed for mild pain.     [provider]  albuterol (VENTOLIN HFA) 108 (90 Base) MCG/ACT inhaler  06/11/20   [provider]  aspirin EC 81 MG tablet Take 1 tablet (81 mg total) by mouth daily. Swallow whole. 07/29/21   Dunn, Nedra Hai, PA-C  atorvastatin (LIPITOR) 10 MG tablet Take 10 mg by mouth at bedtime.    [provider]  blood glucose meter kit and supplies KIT Dispense based on patient and insurance preference. Use up to four times daily as directed. (FOR ICD-9 250.00, 250.01). 05/20/19   Kayleen Memos, DO  Cholecalciferol (VITAMIN D3) 25 MCG (1000 UT) CAPS Take 1 capsule (1,000 Units total) by mouth daily. 05/20/19   Kayleen Memos, DO  famotidine (PEPCID) 40 MG tablet Take 1 tablet (40 mg total) by mouth at bedtime. 01/21/21   Esterwood, Amy S, PA-C  fluticasone (FLONASE) 50 MCG/ACT nasal spray Place 1-2 sprays into both nostrils daily as needed for allergies or rhinitis.  07/25/19   [provider]  furosemide (LASIX) 40 MG tablet $RemoveB'40mg'NGxjEfcK$  twice daily for 5 days, then $RemoveBe'40mg'giByemuNJ$  once daily. 07/28/18   Bonnielee Haff, MD  glipiZIDE (GLUCOTROL XL) 2.5 MG 24 hr tablet Take 2.5 mg by mouth every morning. 06/04/21   [provider]  potassium chloride SA (K-DUR,KLOR-CON) 20 MEQ tablet Take 20 mEq by mouth daily.  04/27/16   [provider]  RABEprazole (ACIPHEX) 20 MG tablet Take 1 tablet (20 mg total) by mouth 2 (two) times daily. Take one tablet before breakfast, and one tablet before dinner 02/27/21   Esterwood, Amy S, PA-C  timolol (TIMOPTIC) 0.5 % ophthalmic solution 1 drop 2 (two) times daily. 07/16/21    [provider]  TRAVATAN Z 0.004 % SOLN ophthalmic solution Place 1 drop into  both eyes at bedtime.  03/14/11   [provider]  metFORMIN (GLUCOPHAGE) 500 MG tablet Take 1 tablet (500 mg total) by mouth 2 (two) times daily with a meal. Patient not taking: No sig reported 05/20/19 08/02/19  Kayleen Memos, DO    Allergies    Patient has no known allergies.  Review of Systems   Review of Systems  Constitutional:  Positive for fever.  All other systems reviewed and are negative.  Physical Exam Updated Vital Signs BP (!) 139/93    Pulse 92    Temp 99.8 F (37.7 C)    Resp 16    Ht $R'5\' 7"'RT$  (1.702 m)    SpO2 98%    BMI 35.55 kg/m   Physical Exam Vitals and nursing note reviewed.  Constitutional:      General: She is not in acute distress.    Appearance: Normal appearance. She is well-developed.  HENT:     Head: Normocephalic and atraumatic.     Nose: Nose normal.     Mouth/Throat:     Mouth: Mucous membranes are moist.  Eyes:     Pupils: Pupils are equal, round, and reactive to light.  Neck:     Comments: Severe kyphosis Cardiovascular:     Rate and Rhythm: Normal rate and regular rhythm.     Heart sounds: Murmur heard.    No friction rub.  Pulmonary:     Effort: Pulmonary effort is normal.     Breath sounds: Normal breath sounds. No wheezing or rales.  Abdominal:     General: Bowel sounds are normal. There is no distension.     Palpations: Abdomen is soft.     Tenderness: There is no abdominal tenderness. There is no guarding or rebound.  Musculoskeletal:        General: Tenderness present. Normal range of motion.     Right lower leg: Edema present.     Left lower leg: Edema present.     Comments: Lymphedema present in bilateral lower ext.  Right leg with erythema, warmth of the lower leg but sparing the foot.  Red streaking up the lateral leg.  No erythema of the groin.  Skin:    General: Skin is warm and dry.     Findings: No rash.  Neurological:      Mental Status: She is alert and oriented to person, place, and time. Mental status is at baseline.     Cranial Nerves: No cranial nerve deficit.  Psychiatric:        Mood and Affect: Mood normal.        Behavior: Behavior normal.    ED Results / Procedures / Treatments   Labs (all labs ordered are listed, but only abnormal results are displayed) Labs Reviewed  CBC WITH DIFFERENTIAL/PLATELET - Abnormal; Notable for the following components:      Result Value   Hemoglobin 10.3 (*)    HCT 32.5 (*)    MCH 25.8 (*)    RDW 16.5 (*)    All other components within normal limits  COMPREHENSIVE METABOLIC PANEL - Abnormal; Notable for the following components:   Glucose, Bld 129 (*)    Albumin 3.3 (*)    AST 14 (*)    Anion gap 4 (*)    All other components within normal limits  RESP PANEL BY RT-PCR (FLU A&B, COVID) ARPGX2    EKG None  Radiology No results found.  Procedures Procedures   Medications Ordered in ED Medications  doxycycline (VIBRA-TABS) tablet 100 mg (has no administration in time range)    ED Course  I have reviewed the triage vital signs and the nursing notes.  Pertinent labs & imaging results that were available during my care of the patient were reviewed by me and considered in my medical decision making (see chart for details).    MDM Rules/Calculators/A&P                         Patient is a 70 year old female with multiple medical problems including bilateral lymphedema who is presenting today with evidence of cellulitis to her right lower leg.  She does report having fevers at home and temperature of 99.8 here.  She initially was seen at urgent care and they sent her here due to concern for possible sepsis or DVT.  Patient does have prior history of DVT in the left lower extremity but reports that it was years ago and she is no longer on any anticoagulation except for baby aspirin daily.  Patient appears well with reassuring vital signs and reassuring labs.   She has only taken 3 doxycycline which she had leftover from over 6 months ago.  This has been an antibiotic that is worked for her in the past.  We will have her continue doxycycline and we will set her up for an outpatient ultrasound to rule out DVT.  Patient appears stable for discharge.  MDM   Amount and/or Complexity of Data Reviewed Clinical lab tests: ordered and reviewed Independent visualization of images, tracings, or specimens: yes       Final Clinical Impression(s) / ED Diagnoses Final diagnoses:  Cellulitis of right lower extremity    Rx / DC Orders ED Discharge Orders          Ordered    LE VENOUS        09/01/21 0444    doxycycline (VIBRAMYCIN) 100 MG capsule  2 times daily        09/01/21 0445             Blanchie Dessert, MD 09/01/21 [phone]

## 2021-09-01 NOTE — Discharge Instructions (Addendum)
All the blood work looks good and your blood pressure and heart rate and oxygen today are good.

## 2021-09-02 ENCOUNTER — Telehealth: Payer: Self-pay | Admitting: *Deleted

## 2021-09-02 DIAGNOSIS — E7800 Pure hypercholesterolemia, unspecified: Secondary | ICD-10-CM

## 2021-09-02 MED ORDER — ROSUVASTATIN CALCIUM 20 MG PO TABS: 20.0000 mg | ORAL_TABLET | Freq: Every day | ORAL | 3 refills | Status: DC

## 2021-09-02 NOTE — Telephone Encounter (Signed)
-----   Message from Charlie Pitter, [place] sent at 08/27/2021  7:46 AM EST ----- Please inform patient that calcium score is elevated in the middle percentile, located primarily in one artery. She is already on a statin which would be the primary recommendation. Given her diabetes and coronary calcium would aim for a goal LDL of <55. Recommend switching atorvastatin to rosuvastatin 20mg  daily with recheck CMET/lipids in 8 weeks.

## 2021-09-02 NOTE — Telephone Encounter (Signed)
Returned the call to the pt.  Voicemail box is full and couldn't accept any messages. Pt asking for copy of lab results but didn't have labs recently. Did speak with her regarding CT Calcium Score, so will mail a copy of that report.

## 2021-09-02 NOTE — Telephone Encounter (Signed)
Patient would like a copy of her lab report.

## 2021-09-04 NOTE — Telephone Encounter (Signed)
Patient has questions about her CT results.  She wants to know how block is it.  She wants to now if her medication was changed.

## 2021-09-08 NOTE — Telephone Encounter (Signed)
Returned the call to pt, no answer / no voicemail.

## 2021-09-11 ENCOUNTER — Inpatient Hospital Stay: Admission: RE | Admit: 2021-09-11 | Payer: Self-pay | Source: Ambulatory Visit

## 2021-09-11 ENCOUNTER — Other Ambulatory Visit: Payer: Self-pay

## 2021-09-11 ENCOUNTER — Ambulatory Visit (HOSPITAL_COMMUNITY): Payer: Medicare PPO | Attending: Cardiovascular Disease

## 2021-09-11 DIAGNOSIS — I872 Venous insufficiency (chronic) (peripheral): Secondary | ICD-10-CM

## 2021-09-11 DIAGNOSIS — E1169 Type 2 diabetes mellitus with other specified complication: Secondary | ICD-10-CM

## 2021-09-11 DIAGNOSIS — E8809 Other disorders of plasma-protein metabolism, not elsewhere classified: Secondary | ICD-10-CM | POA: Diagnosis not present

## 2021-09-11 DIAGNOSIS — I5189 Other ill-defined heart diseases: Secondary | ICD-10-CM | POA: Diagnosis not present

## 2021-09-11 DIAGNOSIS — E669 Obesity, unspecified: Secondary | ICD-10-CM | POA: Diagnosis present

## 2021-09-11 DIAGNOSIS — I890 Lymphedema, not elsewhere classified: Secondary | ICD-10-CM | POA: Diagnosis not present

## 2021-09-11 LAB — ECHOCARDIOGRAM COMPLETE
Area-P 1/2: 4.21 cm2
S' Lateral: 2.7 cm

## 2021-09-15 NOTE — Telephone Encounter (Signed)
Returned call to pt, no answer, no voicemail.  Have made several attempts to call back, will close encounter.

## 2021-09-18 ENCOUNTER — Ambulatory Visit: Payer: Medicare PPO | Admitting: Podiatry

## 2021-09-23 ENCOUNTER — Other Ambulatory Visit: Payer: Self-pay

## 2021-09-23 ENCOUNTER — Ambulatory Visit: Payer: Medicare PPO | Admitting: Podiatry

## 2021-09-23 ENCOUNTER — Encounter: Payer: Self-pay | Admitting: Podiatry

## 2021-09-23 DIAGNOSIS — M79675 Pain in left toe(s): Secondary | ICD-10-CM

## 2021-09-23 DIAGNOSIS — I890 Lymphedema, not elsewhere classified: Secondary | ICD-10-CM | POA: Diagnosis not present

## 2021-09-23 DIAGNOSIS — B351 Tinea unguium: Secondary | ICD-10-CM

## 2021-09-23 DIAGNOSIS — N183 Chronic kidney disease, stage 3 unspecified: Secondary | ICD-10-CM | POA: Diagnosis not present

## 2021-09-23 DIAGNOSIS — M79674 Pain in right toe(s): Secondary | ICD-10-CM

## 2021-09-23 NOTE — Progress Notes (Signed)
Subjective: Jessica Yates is a 71 y.o. female patient seen today for follow up of  painful thick toenails that are difficult to trim. Pain interferes with ambulation. Aggravating factors include wearing enclosed shoe gear. Pain is relieved with periodic professional debridement.  Patient states their blood glucose was 141 mg/dl today.   New problems reported today: None.  No Known Allergies  Objective: Physical Exam  General: Patient is a pleasant 71 y.o. African American female obese in NAD. AAO x 3.   Neurovascular Examination: Capillary fill time to digits <3 seconds b/l lower extremities. Faintly palpable DP pulse(s) b/l lower extremities. Nonpalpable PT pulse(s) b/l lower extremities. Pedal hair absent. Lower extremity skin temperature gradient within normal limits. No pain with calf compression b/l. Lymphedema present b/l lower extremities.  Protective sensation intact 5/5 intact bilaterally with 10g monofilament b/l.  Dermatological:  No open wounds b/l lower extremities. No interdigital macerations b/l lower extremities. Toenails 1-5 b/l elongated, discolored, dystrophic, thickened, crumbly with subungual debris and tenderness to dorsal palpation.  Musculoskeletal:  Normal muscle strength 5/5 to all lower extremity muscle groups bilaterally. Pes planus deformity noted b/l lower extremities.  Assessment: 1. Pain due to onychomycosis of toenails of both feet   2. Lymphedema   3. Stage 3 chronic kidney disease, unspecified whether stage 3a or 3b CKD ([place])    Plan: Patient was evaluated and treated and all questions answered. Consent given for treatment as described below: -No new findings. No new orders. -Patient to continue soft, supportive shoe gear daily. -Toenails 1-5 b/l were debrided in length and girth with sterile nail nippers and dremel without iatrogenic bleeding.  -Patient to report any pedal injuries to medical professional immediately. -Patient/POA to call  should there be question/concern in the interim.  Return in about 3 months (around 12/22/2021) for rfc with [facility] .  Lorenda Peck, MD

## 2021-09-25 ENCOUNTER — Encounter (HOSPITAL_COMMUNITY): Payer: Self-pay

## 2021-09-25 ENCOUNTER — Emergency Department (HOSPITAL_COMMUNITY)
Admission: EM | Admit: 2021-09-25 | Discharge: 2021-09-26 | Disposition: A | Discharge status: Home / Self Care | Payer: Medicare PPO | Attending: Emergency Medicine | Admitting: Emergency Medicine

## 2021-09-25 ENCOUNTER — Ambulatory Visit (HOSPITAL_COMMUNITY)
Admission: EM | Admit: 2021-09-25 | Discharge: 2021-09-25 | Disposition: A | Discharge status: Home / Self Care | Payer: Medicare PPO

## 2021-09-25 ENCOUNTER — Other Ambulatory Visit: Payer: Self-pay

## 2021-09-25 ENCOUNTER — Encounter (HOSPITAL_COMMUNITY): Payer: Self-pay | Admitting: Emergency Medicine

## 2021-09-25 DIAGNOSIS — J45909 Unspecified asthma, uncomplicated: Secondary | ICD-10-CM | POA: Diagnosis not present

## 2021-09-25 DIAGNOSIS — Z86718 Personal history of other venous thrombosis and embolism: Secondary | ICD-10-CM

## 2021-09-25 DIAGNOSIS — Z7982 Long term (current) use of aspirin: Secondary | ICD-10-CM | POA: Diagnosis not present

## 2021-09-25 DIAGNOSIS — I5032 Chronic diastolic (congestive) heart failure: Secondary | ICD-10-CM | POA: Insufficient documentation

## 2021-09-25 DIAGNOSIS — N183 Chronic kidney disease, stage 3 unspecified: Secondary | ICD-10-CM | POA: Diagnosis not present

## 2021-09-25 DIAGNOSIS — Z79899 Other long term (current) drug therapy: Secondary | ICD-10-CM | POA: Insufficient documentation

## 2021-09-25 DIAGNOSIS — R2242 Localized swelling, mass and lump, left lower limb: Secondary | ICD-10-CM | POA: Diagnosis present

## 2021-09-25 DIAGNOSIS — E1122 Type 2 diabetes mellitus with diabetic chronic kidney disease: Secondary | ICD-10-CM | POA: Insufficient documentation

## 2021-09-25 DIAGNOSIS — L03116 Cellulitis of left lower limb: Secondary | ICD-10-CM | POA: Diagnosis not present

## 2021-09-25 DIAGNOSIS — Z7984 Long term (current) use of oral hypoglycemic drugs: Secondary | ICD-10-CM | POA: Insufficient documentation

## 2021-09-25 DIAGNOSIS — D72829 Elevated white blood cell count, unspecified: Secondary | ICD-10-CM | POA: Insufficient documentation

## 2021-09-25 DIAGNOSIS — R000 Tachycardia, unspecified: Secondary | ICD-10-CM | POA: Insufficient documentation

## 2021-09-25 LAB — CBC WITH DIFFERENTIAL/PLATELET
Abs Immature Granulocytes: 0.13 10*3/uL — ABNORMAL HIGH (ref 0.00–0.07)
Basophils Absolute: 0 10*3/uL (ref 0.0–0.1)
Basophils Relative: 0 %
Eosinophils Absolute: 0 10*3/uL (ref 0.0–0.5)
Eosinophils Relative: 0 %
HCT: 38.5 % (ref 36.0–46.0)
Hemoglobin: 11.9 g/dL — ABNORMAL LOW (ref 12.0–15.0)
Immature Granulocytes: 1 %
Lymphocytes Relative: 6 %
Lymphs Abs: 1 10*3/uL (ref 0.7–4.0)
MCH: 24.9 pg — ABNORMAL LOW (ref 26.0–34.0)
MCHC: 30.9 g/dL (ref 30.0–36.0)
MCV: 80.7 fL (ref 80.0–100.0)
Monocytes Absolute: 0.4 10*3/uL (ref 0.1–1.0)
Monocytes Relative: 2 %
Neutro Abs: 14 10*3/uL — ABNORMAL HIGH (ref 1.7–7.7)
Neutrophils Relative %: 91 %
Platelets: 294 10*3/uL (ref 150–400)
RBC: 4.77 MIL/uL (ref 3.87–5.11)
RDW: 17.7 % — ABNORMAL HIGH (ref 11.5–15.5)
WBC: 15.5 10*3/uL — ABNORMAL HIGH (ref 4.0–10.5)
nRBC: 0 % (ref 0.0–0.2)

## 2021-09-25 LAB — COMPREHENSIVE METABOLIC PANEL
ALT: 16 U/L (ref 0–44)
AST: 20 U/L (ref 15–41)
Albumin: 3.7 g/dL (ref 3.5–5.0)
Alkaline Phosphatase: 103 U/L (ref 38–126)
Anion gap: 10 (ref 5–15)
BUN: 15 mg/dL (ref 8–23)
CO2: 24 mmol/L (ref 22–32)
Calcium: 10.3 mg/dL (ref 8.9–10.3)
Chloride: 102 mmol/L (ref 98–111)
Creatinine, Ser: 1.03 mg/dL — ABNORMAL HIGH (ref 0.44–1.00)
GFR, Estimated: 58 mL/min — ABNORMAL LOW (ref >60)
Glucose, Bld: 114 mg/dL — ABNORMAL HIGH (ref 70–99)
Potassium: 3.4 mmol/L — ABNORMAL LOW (ref 3.5–5.1)
Sodium: 136 mmol/L (ref 135–145)
Total Bilirubin: 0.7 mg/dL (ref 0.3–1.2)
Total Protein: 8.5 g/dL — ABNORMAL HIGH (ref 6.5–8.1)

## 2021-09-25 NOTE — ED Triage Notes (Signed)
Pt reports bilat leg swelling since last night causing tingling. Hx lymphedema.

## 2021-09-25 NOTE — Discharge Instructions (Addendum)
-  With your calf pain, leg swelling, fast heart rate, I am concerned for a blood clot in your leg or even your lungs.  This can be deadly.  Please go straight to the emergency department for this evaluation. -We can sometimes diagnose and treat a blood clot if you come in Monday through Friday early in the day.  This is not possible as it is 6 PM on Friday night.

## 2021-09-25 NOTE — ED Notes (Signed)
Patient is being discharged from the Urgent Care and sent to the Emergency Department via POV . Per Marin Roberts, PA, patient is in need of higher level of care due to rule out blood clot in leg. Patient is aware and verbalizes understanding of plan of care.  Vitals:   09/25/21 1756  BP: 114/77  Pulse: (!) 102  Resp: 19  Temp: 99.3 F (37.4 C)  SpO2: 96%

## 2021-09-25 NOTE — ED Provider Triage Note (Signed)
Emergency Medicine Provider Triage Evaluation Note  RAVON MCILHENNY , a 71 y.o. female  was evaluated in triage.  Pt complains of left leg pain and warmth, concern for cellulitis, history of same. Went to urgent care and was sent to the ER for concern for DVT.  Denies chest pain or shortness of breath, urgent care was concerned her elevated heart rate could mean PE. Reports history of lymphedema, previous DVT, not currently anticoagulated. Review of Systems  Positive: Leg pain, swelling, warmth Negative: Fevers, chills, chest pain, shortness of breath  Physical Exam  There were no vitals taken for this visit. Gen:   Awake, no distress   Resp:  Normal effort  MSK:   Moves extremities without difficulty  Other:  Left lower leg is warm to the touch circumferentially, edema to bilateral lower extremities, left more so than right.  Medical Decision Making  Medically screening exam initiated at 7:52 PM.  Appropriate orders placed.  [name] was informed that the remainder of the evaluation will be completed by another provider, this initial triage assessment does not replace that evaluation, and the importance of remaining in the ED until their evaluation is complete.     Tacy Learn, PA-C 09/25/21 1958

## 2021-09-25 NOTE — ED Triage Notes (Signed)
Patient said her left leg is sore. It is stinging and burning a little bit. Pain in the back of her calf and leg is swollen. Patient was sent from urgent care with possible blood clot. They recommended a scan to check for a blood clot. Patients left leg is red, firm and hot to touch.

## 2021-09-25 NOTE — ED Provider Notes (Signed)
[facility]    CSN: [id number] Arrival date & time: 09/25/21  1725      History   Chief Complaint Chief Complaint  Patient presents with   Leg Swelling    HPI Jessica Yates is a 71 y.o. female presenting with L leg swelling xfew days. Medical history DVT 2002, CHF, CKD, cellulitis.  We last saw her for right lower extremity cellulitis 08/31/2021, at that time she was referred to the emergency department for DVT evaluation.  Today she is presenting with left lower extremity swelling and calf tenderness.  Some shortness of breath but states that she always gets short of breath with walking.  No active cancer.  HPI  Past Medical History:  Diagnosis Date   Allergic rhinitis    Anemia 2002   Anxiety    Arthritis    Asthma    Blood clot in vein    Cellulitis    Cellulitis of right lower extremity    Chronic diastolic (congestive) heart failure (HCC)    Chronic kidney disease, stage 3 (HCC)    Coagulation defect (HCC)    Cough    Depression 02/06/2008   Diabetes mellitus, type 2 (HCC)    DVT (deep venous thrombosis) (HCC) 2002   left leg   Dysphagia    Elevated hemoglobin A1c    GERD (gastroesophageal reflux disease)    Glaucoma    Holosystolic murmur    Kidney cysts    Leukocytosis    Liver cyst    Lymphedema    Morbid obesity (HCC)    Overactive bladder    Palpitations    Peripheral venous insufficiency    Primary open angle glaucoma (POAG) of both eyes, mild stage    Seasonal allergies    Sickle cell trait (HCC)    Tortuous aorta (HCC)    Unstable gait    Upper airway cough syndrome    Vitamin D deficiency     Patient Active Problem List   Diagnosis Date Noted   Osteoarthritis of left knee 04/25/2020   Cellulitis 05/13/2019   Cellulitis of right lower extremity 05/12/2019   CKD (chronic kidney disease) stage 3, GFR 30-59 ml/min (HCC) 05/12/2019   Normochromic normocytic anemia 05/12/2019   Cellulitis, leg 05/12/2019   Impacted cerumen of  left ear 02/22/2019   Laryngopharyngeal reflux (LPR) 02/22/2019   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   Pain in left knee 11/13/2018   Cellulitis and abscess of left leg 07/22/2018   Primary open angle glaucoma of both eyes, mild stage 05/08/2018   Cough 05/07/2016   Upper airway cough syndrome 05/07/2016   Sebaceous cyst of labia 04/19/2016   OAB (overactive bladder) 04/19/2016   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   Lymphedema 05/03/2012   CHF (congestive heart failure) (HCC) 04/16/2011   SICKLE-CELL TRAIT 08/01/2007   UNSPECIFIED GLAUCOMA 08/01/2007   DVT 08/01/2007   VENOUS INSUFFICIENCY 08/01/2007   ALLERGIC RHINITIS 08/01/2007   Extrinsic asthma 08/01/2007    Past Surgical History:  Procedure Laterality Date   COSMETIC SURGERY  1975   nasal reconstruction   TOTAL ABDOMINAL HYSTERECTOMY  07/2001    OB History     Gravida  0   Para  0   Term  0   Preterm  0   AB  0   Living  0      SAB  0   IAB  0   Ectopic  0   Multiple  0  Live Births  0            Home Medications    Prior to Admission medications   Medication Sig Start Date End Date Taking? Authorizing Provider  acetaminophen (TYLENOL) 500 MG tablet Take 1,000 mg by mouth every 6 (six) hours as needed for mild pain.     [provider]  albuterol (VENTOLIN HFA) 108 (90 Base) MCG/ACT inhaler  06/11/20   [provider]  aspirin EC 81 MG tablet Take 1 tablet (81 mg total) by mouth daily. Swallow whole. 07/29/21   Dunn, Nedra Hai, PA-C  blood glucose meter kit and supplies KIT Dispense based on patient and insurance preference. Use up to four times daily as directed. (FOR ICD-9 250.00, 250.01). 05/20/19   Kayleen Memos, DO  Cholecalciferol (VITAMIN D3) 25 MCG (1000 UT) CAPS Take 1 capsule (1,000 Units total) by mouth daily. 05/20/19   Kayleen Memos, DO  doxycycline (VIBRAMYCIN) 100 MG capsule Take 1 capsule (100 mg total) by mouth 2 (two) times daily.  09/01/21   Blanchie Dessert, MD  famotidine (PEPCID) 40 MG tablet Take 1 tablet (40 mg total) by mouth at bedtime. 01/21/21   Esterwood, Amy S, PA-C  fluticasone (FLONASE) 50 MCG/ACT nasal spray Place 1-2 sprays into both nostrils daily as needed for allergies or rhinitis.  07/25/19   [provider]  furosemide (LASIX) 40 MG tablet $RemoveB'40mg'UQmsPcBz$  twice daily for 5 days, then $RemoveBe'40mg'MnXaHDkrD$  once daily. 07/28/18   Bonnielee Haff, MD  glipiZIDE (GLUCOTROL XL) 2.5 MG 24 hr tablet Take 2.5 mg by mouth every morning. 06/04/21   [provider]  potassium chloride SA (K-DUR,KLOR-CON) 20 MEQ tablet Take 20 mEq by mouth daily.  04/27/16   [provider]  RABEprazole (ACIPHEX) 20 MG tablet Take 1 tablet (20 mg total) by mouth 2 (two) times daily. Take one tablet before breakfast, and one tablet before dinner 02/27/21   Esterwood, Amy S, PA-C  rosuvastatin (CRESTOR) 20 MG tablet Take 1 tablet (20 mg total) by mouth daily. 09/02/21 12/01/21  Dunn, Nedra Hai, PA-C  timolol (TIMOPTIC) 0.5 % ophthalmic solution 1 drop 2 (two) times daily. 07/16/21   [provider]  TRAVATAN Z 0.004 % SOLN ophthalmic solution Place 1 drop into both eyes at bedtime.  03/14/11   [provider]  metFORMIN (GLUCOPHAGE) 500 MG tablet Take 1 tablet (500 mg total) by mouth 2 (two) times daily with a meal. Patient not taking: No sig reported 05/20/19 08/02/19  Kayleen Memos, DO    Family History Family History  Problem Relation Age of Onset   Allergies Mother    Asthma Mother    Other Mother        enlarged heart   Arthritis Mother    Breast cancer Mother    Diabetes Mother    Hypertension Mother    Cancer Mother        breast   Hyperlipidemia Mother    Heart disease Maternal Grandmother    Breast cancer Maternal Grandmother    Birth defects Maternal Grandmother        breast   Cancer Father 69       leukemia   Allergies Sister    Arthritis Sister     Social History Social History   Tobacco Use    Smoking status: Never   Smokeless tobacco: Never  Vaping Use   Vaping Use: Never used  Substance Use Topics   Alcohol use: No  Drug use: No     Allergies   Patient has no known allergies.   Review of Systems Review of Systems  Skin:  Positive for color change.  All other systems reviewed and are negative.   Physical Exam Triage Vital Signs ED Triage Vitals  Enc Vitals Group     BP 09/25/21 1756 114/77     Pulse Rate 09/25/21 1756 (!) 102     Resp 09/25/21 1756 19     Temp 09/25/21 1756 99.3 F (37.4 C)     Temp Source 09/25/21 1756 Oral     SpO2 09/25/21 1756 96 %     Weight --      Height --      Head Circumference --      Peak Flow --      Pain Score 09/25/21 1755 7     Pain Loc --      Pain Edu? --      Excl. in [place]? --    No data found.  Updated Vital Signs BP 114/77 (BP Location: Left Arm)    Pulse (!) 102    Temp 99.3 F (37.4 C) (Oral)    Resp 19    SpO2 96%   Visual Acuity Right Eye Distance:   Left Eye Distance:   Bilateral Distance:    Right Eye Near:   Left Eye Near:    Bilateral Near:     Physical Exam Vitals reviewed.  Constitutional:      General: She is not in acute distress.    Appearance: Normal appearance. She is obese. She is not ill-appearing or diaphoretic.  HENT:     Head: Normocephalic and atraumatic.  Cardiovascular:     Rate and Rhythm: Regular rhythm. Tachycardia present.     Heart sounds: Normal heart sounds.     Comments: R calf measures 45 cm, L 42 cm. R calf is TTP. Positive homan sign. Pulmonary:     Effort: Pulmonary effort is normal.     Breath sounds: Normal breath sounds.  Musculoskeletal:     Right lower leg: 2+ Pitting Edema present.     Left lower leg: 1+ Pitting Edema present.  Skin:    General: Skin is warm.     Comments: L lateral calf - skin is tender, warm, erythematous.   Neurological:     General: No focal deficit present.     Mental Status: She is alert and oriented to person, place, and  time.  Psychiatric:        Mood and Affect: Mood normal.        Behavior: Behavior normal.        Thought Content: Thought content normal.        Judgment: Judgment normal.     UC Treatments / Results  Labs (all labs ordered are listed, but only abnormal results are displayed) Labs Reviewed - No data to display  EKG   Radiology No results found.  Procedures Procedures (including critical care time)  Medications Ordered in UC Medications - No data to display  Initial Impression / Assessment and Plan / UC Course  I have reviewed the triage vital signs and the nursing notes.  Pertinent labs & imaging results that were available during my care of the patient were reviewed by me and considered in my medical decision making (see chart for details).     This patient is a very pleasant 71 y.o. year old female presenting with L LE cellulitis and  possible DVT/ PE. Tachy at 102.  History DVT 2002. Wells score for PE is 9.0 today, placing her at high risk for this.   Given time of day and weekend, unable to to order an ultrasound. Also unable to order chest CT given urgent care setting.  Given concern for PE, I recommended she head straight to the emergency department for this evaluation.  She verbalizes understanding.  Final Clinical Impressions(s) / UC Diagnoses   Final diagnoses:  Cellulitis of left lower extremity  Tachycardia  History of DVT in adulthood     Discharge Instructions      -With your calf pain, leg swelling, fast heart rate, I am concerned for a blood clot in your leg or even your lungs.  This can be deadly.  Please go straight to the emergency department for this evaluation. -We can sometimes diagnose and treat a blood clot if you come in Monday through Friday early in the day.  This is not possible as it is 6 PM on Friday night.     ED Prescriptions   None    PDMP not reviewed this encounter.   Hazel Sams, PA-C 09/25/21 1815

## 2021-09-26 ENCOUNTER — Emergency Department (HOSPITAL_BASED_OUTPATIENT_CLINIC_OR_DEPARTMENT_OTHER): Payer: Medicare PPO

## 2021-09-26 DIAGNOSIS — M7989 Other specified soft tissue disorders: Secondary | ICD-10-CM | POA: Diagnosis not present

## 2021-09-26 MED ORDER — DOXYCYCLINE HYCLATE 100 MG PO CAPS: 100.0000 mg | ORAL_CAPSULE | Freq: Two times a day (BID) | ORAL | 0 refills | Status: AC

## 2021-09-26 NOTE — Progress Notes (Signed)
Lower extremity venous LT study completed.  Preliminary results relayed to Roslynn Amble, MD via secure chat.  See CV Proc for preliminary results report.   Darlin Coco, RDMS, RVT

## 2021-09-26 NOTE — ED Provider Notes (Signed)
[facility] DEPT Provider Note   CSN: [id number] Arrival date & time: 09/25/21  1946     History Past Medical History:  Diagnosis Date   Allergic rhinitis    Anemia 2002   Anxiety    Arthritis    Asthma    Blood clot in vein    Cellulitis    Cellulitis of right lower extremity    Chronic diastolic (congestive) heart failure (HCC)    Chronic kidney disease, stage 3 (HCC)    Coagulation defect (HCC)    Cough    Depression 02/06/2008   Diabetes mellitus, type 2 (HCC)    DVT (deep venous thrombosis) ([place]) 2002   left leg   Dysphagia    Elevated hemoglobin A1c    GERD (gastroesophageal reflux disease)    Glaucoma    Holosystolic murmur    Kidney cysts    Leukocytosis    Liver cyst    Lymphedema    Morbid obesity ([place])    Overactive bladder    Palpitations    Peripheral venous insufficiency    Primary open angle glaucoma (POAG) of both eyes, mild stage    Seasonal allergies    Sickle cell trait ([place])    Tortuous aorta (HCC)    Unstable gait    Upper airway cough syndrome    Vitamin D deficiency     Chief Complaint  Patient presents with   Leg Swelling    Jessica Yates is a 71 y.o. female.  Patient is coming in with leg pain and swelling that started Wednesday. She does endorse subjective fevers since then. She has a history of chronic lymphedema. She was recently treated for cellulitis of the opposite leg recently. She says that her legs feel warm to her as well. Still able to move her extremities without difficulty.        Home Medications Prior to Admission medications   Medication Sig Start Date End Date Taking? Authorizing Provider  doxycycline (VIBRAMYCIN) 100 MG capsule Take 1 capsule (100 mg total) by mouth 2 (two) times daily for 10 days. 09/26/21 10/06/21 Yes Lauryl Seyer, MD  acetaminophen (TYLENOL) 500 MG tablet Take 1,000 mg by mouth every 6 (six) hours as needed for mild pain.     [provider]   albuterol (VENTOLIN HFA) 108 (90 Base) MCG/ACT inhaler  06/11/20   [provider]  aspirin EC 81 MG tablet Take 1 tablet (81 mg total) by mouth daily. Swallow whole. 07/29/21   Dunn, Nedra Hai, PA-C  blood glucose meter kit and supplies KIT Dispense based on patient and insurance preference. Use up to four times daily as directed. (FOR ICD-9 250.00, 250.01). 05/20/19   Kayleen Memos, DO  Cholecalciferol (VITAMIN D3) 25 MCG (1000 UT) CAPS Take 1 capsule (1,000 Units total) by mouth daily. 05/20/19   Kayleen Memos, DO  famotidine (PEPCID) 40 MG tablet Take 1 tablet (40 mg total) by mouth at bedtime. 01/21/21   Esterwood, Amy S, PA-C  fluticasone (FLONASE) 50 MCG/ACT nasal spray Place 1-2 sprays into both nostrils daily as needed for allergies or rhinitis.  07/25/19   [provider]  furosemide (LASIX) 40 MG tablet $RemoveB'40mg'gHmrwaau$  twice daily for 5 days, then $RemoveBe'40mg'ZPppIntWq$  once daily. 07/28/18   Bonnielee Haff, MD  glipiZIDE (GLUCOTROL XL) 2.5 MG 24 hr tablet Take 2.5 mg by mouth every morning. 06/04/21   [provider]  potassium chloride SA (K-DUR,KLOR-CON) 20 MEQ tablet Take 20 mEq by  mouth daily.  04/27/16   [provider]  RABEprazole (ACIPHEX) 20 MG tablet Take 1 tablet (20 mg total) by mouth 2 (two) times daily. Take one tablet before breakfast, and one tablet before dinner 02/27/21   Esterwood, Amy S, PA-C  rosuvastatin (CRESTOR) 20 MG tablet Take 1 tablet (20 mg total) by mouth daily. 09/02/21 12/01/21  Dunn, Nedra Hai, PA-C  timolol (TIMOPTIC) 0.5 % ophthalmic solution 1 drop 2 (two) times daily. 07/16/21   [provider]  TRAVATAN Z 0.004 % SOLN ophthalmic solution Place 1 drop into both eyes at bedtime.  03/14/11   [provider]  metFORMIN (GLUCOPHAGE) 500 MG tablet Take 1 tablet (500 mg total) by mouth 2 (two) times daily with a meal. Patient not taking: No sig reported 05/20/19 08/02/19  Kayleen Memos, DO      Allergies    Patient has no known allergies.     Review of Systems   Review of Systems  Constitutional:  Positive for fever.  Cardiovascular:  Positive for leg swelling.  Musculoskeletal:  Positive for myalgias.  Skin:  Positive for color change.   Physical Exam Updated Vital Signs BP 138/74 (BP Location: Left Arm)    Pulse (!) 102    Temp 98.5 F (36.9 C) (Oral)    Resp 20    Ht $R'5\' 7"'Br$  (1.702 m)    Wt 103 kg    SpO2 99%    BMI 35.55 kg/m  Physical Exam Constitutional:      Appearance: Normal appearance.  HENT:     Head: Normocephalic and atraumatic.  Eyes:     Extraocular Movements: Extraocular movements intact.  Cardiovascular:     Rate and Rhythm: Regular rhythm. Tachycardia present.     Heart sounds: Murmur heard.  Pulmonary:     Effort: Pulmonary effort is normal.  Abdominal:     General: Abdomen is flat.     Palpations: Abdomen is soft.  Musculoskeletal:     Right lower leg: Swelling present.     Left lower leg: Swelling and tenderness present.     Right ankle: Normal range of motion.     Left ankle: Normal range of motion.     Comments: Erythematous, warm, swollen lower left leg and foot  Neurological:     Mental Status: She is alert.    ED Results / Procedures / Treatments   Labs (all labs ordered are listed, but only abnormal results are displayed) Labs Reviewed  COMPREHENSIVE METABOLIC PANEL - Abnormal; Notable for the following components:      Result Value   Potassium 3.4 (*)    Glucose, Bld 114 (*)    Creatinine, Ser 1.03 (*)    Total Protein 8.5 (*)    GFR, Estimated 58 (*)    All other components within normal limits  CBC WITH DIFFERENTIAL/PLATELET - Abnormal; Notable for the following components:   WBC 15.5 (*)    Hemoglobin 11.9 (*)    MCH 24.9 (*)    RDW 17.7 (*)    Neutro Abs 14.0 (*)    Abs Immature Granulocytes 0.13 (*)    All other components within normal limits    EKG None  Radiology VAS [place] LOWER EXTREMITY VENOUS (DVT) (ONLY [facility])  Result Date: 09/26/2021  Lower Venous  DVT Study Patient Name:  Jessica Yates [facility]  Date of Exam:   09/26/2021 Medical Rec #: [id number]          Accession #:  [id number] Date of Birth: May 21, 1951          Patient Gender: F Patient Age:   1 years Exam Location:  [facility] Procedure:      VAS [place] LOWER EXTREMITY VENOUS (DVT) Referring Phys: Madalyn Rob --------------------------------------------------------------------------------  Indications: Increased swelling of left lower extremity, patient with history of cellulitis and lymphedema.  Limitations: Skin changes and subcutaneous edema of left calf. Comparison Study: Multiple prior studies. Most recent, 07-24-2021 bilateral                   lower extremity venous reflux was positive for superficial                   vein reflux bilaterally. Performing Technologist: Darlin Coco RDMS, RVT  Examination Guidelines: A complete evaluation includes B-mode imaging, spectral Doppler, color Doppler, and power Doppler as needed of all accessible portions of each vessel. Bilateral testing is considered an integral part of a complete examination. Limited examinations for reoccurring indications may be performed as noted. The reflux portion of the exam is performed with the patient in reverse Trendelenburg.  +-----+---------------+---------+-----------+----------+--------------+  RIGHT Compressibility Phasicity Spontaneity Properties Thrombus Aging  +-----+---------------+---------+-----------+----------+--------------+  CFV   Full            Yes       Yes                                    +-----+---------------+---------+-----------+----------+--------------+   +---------+---------------+---------+-----------+----------+-------------------+  LEFT      Compressibility Phasicity Spontaneity Properties Thrombus Aging       +---------+---------------+---------+-----------+----------+-------------------+  CFV       Full            Yes       Yes                                          +---------+---------------+---------+-----------+----------+-------------------+  SFJ       Full                                                                  +---------+---------------+---------+-----------+----------+-------------------+  FV Prox   Full                                                                  +---------+---------------+---------+-----------+----------+-------------------+  FV Mid    Full                                                                  +---------+---------------+---------+-----------+----------+-------------------+  FV Distal Full                                                                  +---------+---------------+---------+-----------+----------+-------------------+  PFV       Full                                                                  +---------+---------------+---------+-----------+----------+-------------------+  POP       Full            Yes       Yes                                         +---------+---------------+---------+-----------+----------+-------------------+  PTV       Full                                                                  +---------+---------------+---------+-----------+----------+-------------------+  PERO                                                       Not well visualized  +---------+---------------+---------+-----------+----------+-------------------+  Gastroc   Full                                                                  +---------+---------------+---------+-----------+----------+-------------------+    Summary: RIGHT: - No evidence of common femoral vein obstruction.  LEFT: - There is no evidence of deep vein thrombosis in the lower extremity. However, portions of this examination were limited- see technologist comments above.  - No cystic structure found in the popliteal fossa.  *See table(s) above for measurements and observations.    Preliminary     Procedures Procedures    Medications Ordered  in ED Medications - No data to display  ED Course/ Medical Decision Making/ A&P                           Medical Decision Making  Patient coming in with red warm and swollen left leg compared to right. At baseline patient does have lymphedema though this is worse than baseline. She recently took doxycycline for cellulitis of the other leg which seemed to work. Vascular ultrasound reviewed by me negative for acute DVT. Lab work reviewed by me is notable for a leukocytosis of 15.5. Presentation concerning for cellulitis. Patient is afebrile and overall well appearing with stable vital signs. Considered admission for observation however think patient is well enough to go with close follow up and strict return precautions with a course of oral antibiotics. Patient said that doxy has worked for her in the past.    Final Clinical Impression(s) / ED Diagnoses Final diagnoses:  Cellulitis of left lower extremity  Rx / DC Orders ED Discharge Orders          Ordered    doxycycline (VIBRAMYCIN) 100 MG capsule  2 times daily        09/26/21 1116              Scarlett Presto, MD 09/26/21 1122    Lucrezia Starch, MD 09/27/21 [phone]

## 2021-09-26 NOTE — Discharge Instructions (Addendum)
Jessica Yates  You were recently seen at [facility] ER for leg swelling and pain. We checked you for a DVT or blood clot which you did not have. We are going to prescribe you a course of antibiotics. You should also follow up with podiatry.  If you have increasing pain, swelling, trouble breathing, or lightheadedness we recommend you come back to the ER for further evaluation.

## 2021-09-26 NOTE — ED Notes (Signed)
Pt temp for d/c 100.0. Dykstra MD notified, okay to dc pt and take home tylenol. Fever management education provided to pt, pt verbalized understanding.

## 2021-09-29 ENCOUNTER — Telehealth: Payer: Self-pay | Admitting: *Deleted

## 2021-09-29 DIAGNOSIS — R0683 Snoring: Secondary | ICD-10-CM

## 2021-09-29 DIAGNOSIS — I50810 Right heart failure, unspecified: Secondary | ICD-10-CM

## 2021-09-29 NOTE — Telephone Encounter (Signed)
I called and s/w the pt in regard to setting her up with [facility]. Pt is agreeable to plan of care. Pt will come by next Thursday 10/08/21 3:3. Pt will let our check-in know that she is here to see Arbie Cookey who does the home sleep studies. I will place stop bang when pt comes in next week. Pt aware if she needs to change the date or time she can call the office back and leave message with operator who will get a message to me. Pt thanked me for the call and the help.

## 2021-09-29 NOTE — Telephone Encounter (Signed)
-----   Message from Charlie Pitter, [place] sent at 09/14/2021  8:01 AM EST ----- Please inform patient that echo shows normal left heart function, Mild dilation of aorta. The right side of her heart is mildly reduced and right ventricle is mildly enlarged. PA pressure is normal though. Since right heart can be indicative of pulmonary issues I would recommend she re-establish care with pulmonology team, saw them several years ago. This is also very commonly seen in sleep apnea so would arrange sleep study for RV dysfunction (unsure if she needs an OV to discuss/document STOPBANG) - would push up follow-up with Dr. Johney Frame to review echo further and decide on further workup if needed.

## 2021-09-29 NOTE — Telephone Encounter (Signed)
-----   Message from Jeanann Lewandowsky, [place] sent at 09/29/2021  1:40 PM EST ----- Regarding: Jessica Yates Have ordered this pt a Jessica Yates sleep study.  She is aware someone will call her to arrange.  Thanks!

## 2021-09-30 ENCOUNTER — Telehealth: Payer: Self-pay | Admitting: Cardiology

## 2021-09-30 NOTE — Telephone Encounter (Signed)
Spoke with pt who states she would like to review echo results from Melina Copa, [place] - discussed yesterday by Anderson Malta.  This RN offered to assist pt.  Pt advised of the results as below.  Please inform patient that echo shows normal left heart function, Mild dilation of aorta. The right side of her heart is mildly reduced and right ventricle is mildly enlarged. PA pressure is normal though. Since right heart can be indicative of pulmonary issues I would recommend she re-establish care with pulmonology team, saw them several years ago. This is also very commonly seen in sleep apnea so would arrange sleep study for RV dysfunction (unsure if she needs an OV to discuss/document STOPBANG) - would push up follow-up with Dr. Johney Frame to review echo further and decide on further workup if needed.  Pt reports she has been contacted re: sleep study and is to come by the office next Thursday 10/08/2021 to see Jonni Sanger.  Pt advised her appointment with Dr Johney Frame has been moved to 11/11/2021.  Pt also advised to contact Dr Gustavus Bryant office with [facility] to re-establish care.  Confirmed with pt she is to be taking Rosuvastatin 20mg  - 1 tablet by mouth daily.  Pt verbalizes understanding and agrees with current plan.

## 2021-09-30 NOTE — Telephone Encounter (Signed)
Pt would like to speak with nurse Anderson Malta please advise

## 2021-10-08 NOTE — Telephone Encounter (Signed)
Pt came in today to be set up for her Itamar Sleep Study that was ordered by Melina Copa, PAC. I helped set up the app on the pt's phone and went over all instructions on how to do the sleep study.  Pt thanked me for the call and the help. Pt is aware to not open the sleep study until I call he with a PIN #. Once I call her with the PIN# she will be advised then to proceed with sleep study within 2 weeks if possible. Pt aware if study has not been done within 30 days it will be sent to billing and she will be charged $100. Pt verbalized understanding to all instructions.

## 2021-10-08 NOTE — Telephone Encounter (Addendum)
Patient Name:         DOB:       Height:     Weight:  Office Name:         Referring Provider:  Today's Date:  Date:   STOP BANG RISK ASSESSMENT S (snore) Have you been told that you snore?     YES   T (tired) Are you often tired, fatigued, or sleepy during the day?   YES  O (obstruction) Do you stop breathing, choke, or gasp during sleep? NO   P (pressure) Do you have or are you being treated for high blood pressure? NO   B (BMI) Is your body index greater than 35 kg/m? YES  A (age) Are you 71 years old or older? YES   N (neck) Do you have a neck circumference greater than 16 inches?   YES   G (gender) Are you a female? NO   TOTAL STOP/BANG YES ANSWERS 5                                                                       For Office Use Only              Procedure Order Form    YES to 3+ Stop Bang questions OR two clinical symptoms - patient qualifies for WatchPAT (CPT 95800)             Clinical Notes: Will consult Sleep Specialist and refer for management of therapy due to patient increased risk of Sleep Apnea. Ordering a sleep study due to the following two clinical symptoms: Excessive daytime sleepiness G47.10 / Gastroesophageal reflux K21.9 / Nocturia R35.1 / Morning Headaches G44.221 / Difficulty concentrating R41.840 / Memory problems or poor judgment G31.84 / Personality changes or irritability R45.4 / Loud snoring R06.83 / Depression F32.9 / Unrefreshed by sleep G47.8 / Impotence N52.9 / History of high blood pressure R03.0 / Insomnia G47.00    I understand that I am proceeding with a home sleep apnea test as ordered by my treating physician. I understand that untreated sleep apnea is a serious cardiovascular risk factor and it is my responsibility to perform the test and seek management for sleep apnea. I will be contacted with the results and be managed for sleep apnea by a local sleep physician. I will be receiving equipment and further instructions from [facility]. I shall promptly ship back the equipment via the included mailing label. I understand my insurance will be billed for the test and as the patient I am responsible for any insurance related out-of-pocket costs incurred. I have been provided with written instructions and can call for additional video or telephonic instruction, with 24-hour availability of qualified personnel to answer any questions: Patient Help Desk [phone].  Patient Signature ______________________________________________________   Date______________________ Patient Telemedicine Verbal Consent

## 2021-10-12 ENCOUNTER — Other Ambulatory Visit: Payer: Self-pay | Admitting: Physician Assistant

## 2021-10-14 ENCOUNTER — Other Ambulatory Visit (HOSPITAL_BASED_OUTPATIENT_CLINIC_OR_DEPARTMENT_OTHER): Payer: Self-pay

## 2021-10-20 NOTE — Telephone Encounter (Signed)
Per [facility] Medicare No PA is required for the itamar sleep test. Ok to take the test.

## 2021-10-20 NOTE — Telephone Encounter (Signed)
I was able to reach pt on [phone], her primary number listed, vm was full. Pt has been given PIN # [id number] with verbal understanding all instructions. Pt will do sleep study between tonight 10/20/21 and Friday night 10/23/21.   Called and made the patient aware that she may proceed with the [facility] Sleep Study. PIN # provided to the patient. Patient made aware that she  will be contacted after the test has been read with the results and any recommendations. Patient verbalized understanding and thanked me for the call.

## 2021-10-20 NOTE — Telephone Encounter (Signed)
I have sent a message to Gershon Cull, CMA with [facility] to please check on if pt has been approved for Itamar. Pt was set up 12 days ago with Itamar.

## 2021-10-21 NOTE — Telephone Encounter (Signed)
I s/w the pt who had a few questions still about the sleep study. I have answered all questions for the the pt with verbal understanding from the pt.

## 2021-10-21 NOTE — Telephone Encounter (Signed)
No PA required for HST for [facility]

## 2021-10-21 NOTE — Telephone Encounter (Signed)
Patient is calling back with questions/concerns. Please advise  

## 2021-10-21 NOTE — Progress Notes (Signed)
Cardiology Office Note:    Date:  10/26/2021   ID:  Jessica Yates, DOB February 15, 1951, MRN [id number]  PCP:  Lucianne Lei, MD  [facility] Cardiologist:  Freada Bergeron, MD  [place] Electrophysiologist:  None   Referring MD: No ref. provider found    History of Present Illness:    Jessica Yates is a 71 y.o. female with a hx of asthma, diastolic dysfunction, chronic lymphedema, DMII, CKD stage 3, and DVT who presents to clinic for follow-up.   Patient has known history of chronic lymphedema (started at age 60) and has recurrent cellulitis managed by vascular. Has difficulty ambulating due to LE swelling and arthritis. Walks with cane, but no exertional symptoms. No known history of CAD. Was told she had "congestive heart failure," but is unsure why she was diagnosed with this. States she has never been hospitalized for fluid in her lungs, worsening SOB, or chest pain. Denies chest pain, SOB on exertion, palpitations, nausea or vomiting, abdominal pain. No orthopnea or PND. For her lymphedema, she does LE wrapping, compression stockings, LE elevation, and lasix. TTE in 2019 with normal BiV function, G1DD, no valvular abnormalities.    Last seen in clinic by Melina Copa on 07/24/21 where she was feeling well. Chronic lymphedema persisted. TTE with normal EF, mild RV dysfunction, borderline aorta 43m. Ca score 18 (54%).  Today, the patient continues to have significant LE edema and has had several bouts of cellulitis. She admits to not taking her lasix daily as it makes her urinate a lot. Occasional chest tightness in the right side. No lightheadedness, dizziness or syncope. Blood pressure is generally well controlled at home. Has changed from lipitor to crestor 261mdaily. We discussed her echo results and coronary calcium score at length today   Past Medical History:  Diagnosis Date   Allergic rhinitis    Anemia 2002   Anxiety    Arthritis    Asthma    Blood clot in  vein    Cellulitis    Cellulitis of right lower extremity    Chronic diastolic (congestive) heart failure (HCC)    Chronic kidney disease, stage 3 (HCC)    Coagulation defect (HCC)    Cough    Depression 02/06/2008   Diabetes mellitus, type 2 (HCC)    DVT (deep venous thrombosis) ([place]   left leg   Dysphagia    Elevated hemoglobin A1c    GERD (gastroesophageal reflux disease)    Glaucoma    Holosystolic murmur    Kidney cysts    Leukocytosis    Liver cyst    Lymphedema    Morbid obesity ([place]   Overactive bladder    Palpitations    Peripheral venous insufficiency    Primary open angle glaucoma (POAG) of both eyes, mild stage    Seasonal allergies    Sickle cell trait (HCC)    Tortuous aorta (HCC)    Unstable gait    Upper airway cough syndrome    Vitamin D deficiency     Past Surgical History:  Procedure Laterality Date   COSMETIC SURGERY  1975   nasal reconstruction   TOTAL ABDOMINAL HYSTERECTOMY  07/2001    Current Medications: Current Meds  Medication Sig   acetaminophen (TYLENOL) 500 MG tablet Take 1,000 mg by mouth every 6 (six) hours as needed for mild pain.    albuterol (VENTOLIN HFA) 108 (90 Base) MCG/ACT inhaler    aspirin EC 81 MG tablet  Take 1 tablet (81 mg total) by mouth daily. Swallow whole.   blood glucose meter kit and supplies KIT Dispense based on patient and insurance preference. Use up to four times daily as directed. (FOR ICD-9 250.00, 250.01).   cetirizine (ZYRTEC) 10 MG tablet Take 10 mg by mouth daily.   famotidine (PEPCID) 40 MG tablet Take 1 tablet (40 mg total) by mouth at bedtime.   fluticasone (FLONASE) 50 MCG/ACT nasal spray Place 1-2 sprays into both nostrils daily as needed for allergies or rhinitis.    furosemide (LASIX) 40 MG tablet Take 1 tablet by mouth twice a day X's 3 days then go down to 1 tablet by mouth daily   glipiZIDE (GLUCOTROL XL) 2.5 MG 24 hr tablet Take 2.5 mg by mouth every morning.   potassium chloride SA  (K-DUR,KLOR-CON) 20 MEQ tablet Take 20 mEq by mouth daily.    RABEprazole (ACIPHEX) 20 MG tablet TAKE 1 TABLET (20 MG TOTAL) BY MOUTH 2 (TWO) TIMES DAILY. TAKE ONE TABLET BEFORE BREAKFAST, AND ONE TABLET BEFORE DINNER   rosuvastatin (CRESTOR) 20 MG tablet Take 1 tablet (20 mg total) by mouth daily.   timolol (TIMOPTIC) 0.5 % ophthalmic solution 1 drop 2 (two) times daily.   TRAVATAN Z 0.004 % SOLN ophthalmic solution Place 1 drop into both eyes at bedtime.    [DISCONTINUED] furosemide (LASIX) 40 MG tablet 72m twice daily for 5 days, then 429monce daily.     Allergies:   Patient has no known allergies.   Social History   Socioeconomic History   Marital status: Single    Spouse name: Not on file   Number of children: 0   Years of education: Not on file   Highest education level: Not on file  Occupational History   Occupation: retired from [employer]   Smoking status: Never   Smokeless tobacco: Never  Vaping Use   Vaping Use: Never used  Substance and Sexual Activity   Alcohol use: No   Drug use: No   Sexual activity: Never  Other Topics Concern   Not on file  Social History Narrative   Lives with mother and brother         Social Determinants of Health   Financial Resource Strain: Not on file  Food Insecurity: Not on file  Transportation Needs: Not on file  Physical Activity: Not on file  Stress: Not on file  Social Connections: Not on file     Family History: The patient's family history includes Allergies in her mother and sister; Arthritis in her mother and sister; Asthma in her mother; Birth defects in her maternal grandmother; Breast cancer in her maternal grandmother and mother; Cancer in her mother; Cancer (age of onset: 5012in her father; Diabetes in her mother; Heart disease in her maternal grandmother; Hyperlipidemia in her mother; Hypertension in her mother; Other in her mother.  ROS:   Please see the history of present  illness.    Review of Systems  Constitutional:  Negative for chills and fever.  HENT:  Negative for congestion.   Eyes:  Negative for blurred vision.  Respiratory:  Negative for shortness of breath.   Cardiovascular:  Positive for leg swelling. Negative for chest pain, palpitations, orthopnea, claudication and PND.  Gastrointestinal:  Negative for nausea and vomiting.  Genitourinary:  Negative for hematuria.  Musculoskeletal:  Negative for falls.  Neurological:  Negative for dizziness and loss of consciousness.  Endo/Heme/Allergies:  Negative for  polydipsia.  Psychiatric/Behavioral:  Negative for substance abuse.    EKGs/Labs/Other Studies Reviewed:    The following studies were reviewed today: Ca score 08/2021: FINDINGS: Coronary arteries: Normal origins.   Coronary Calcium Score:   Left main: 0   Left anterior descending artery: 18.8   Left circumflex artery: 0   Right coronary artery: 0   Total: 18.8   Percentile: 52nd   Pericardium: Normal.   Ascending Aorta: Normal caliber.   Non-cardiac: See separate report from [facility].   IMPRESSION: Coronary calcium score of 18.8. This was 52nd percentile for age-, race-, and sex-matched controls.  TTE 08/2021: IMPRESSIONS     1. Left ventricular ejection fraction, by estimation, is 70 to 75%. The  left ventricle has hyperdynamic function. The left ventricle has no  regional wall motion abnormalities. Left ventricular diastolic parameters  are indeterminate.   2. Right ventricular systolic function is mildly reduced. The right  ventricular size is mildly enlarged. There is normal pulmonary artery  systolic pressure.   3. Left atrial size was mildly dilated.   4. The mitral valve is normal in structure. Trivial mitral valve  regurgitation. No evidence of mitral stenosis.   5. The aortic valve is tricuspid. Aortic valve regurgitation is not  visualized. No aortic stenosis is present.   6. Aortic  dilatation noted. There is borderline dilatation of the  ascending aorta, measuring 36 mm.   7. The inferior vena cava is normal in size with greater than 50%  respiratory variability, suggesting right atrial pressure of 3 mmHg.   07/26/18: Study Conclusions  - Left ventricle: The cavity size was normal. Wall thickness was    normal. Systolic function was normal. The estimated ejection    fraction was in the range of 55% to 60%. Wall motion was normal;    there were no regional wall motion abnormalities. Doppler    parameters are consistent with abnormal left ventricular    relaxation (grade 1 diastolic dysfunction). The E/e&' ratio is    between 8-15, suggesting inderminate LV filling pressure.  - Mitral valve: Mildly thickened leaflets . There was trivial    regurgitation.  - Left atrium: The atrium was normal in size.  - Inferior vena cava: The vessel was normal in size. The    respirophasic diameter changes were in the normal range (>= 50%),    consistent with normal central venous pressure.   Impressions:   - LVEF 55-60%, normal wall thickness, normal wall motion, grade 1    DD, indeterminate LV filling pressure, trivial MR, normal LA    size, normal IVC.   LE doppler ultrasound 08/06/19:  Summary:  Right: There is no evidence of deep vein thrombosis in the lower  extremity. No cystic structure found in the popliteal fossa.  Left: No evidence of common femoral vein obstruction.   CTA 12/28/15: FINDINGS: There are no filling defects within the pulmonary arteries to suggest pulmonary embolus.   Mild multi chamber cardiomegaly. Thoracic aorta is normal in caliber, tortuous in its course. No dissection or aneurysm. Conventional branching pattern from the aortic arch. No mediastinal or hilar adenopathy. No pericardial effusion.   No pleural effusion. Dependent linear atelectasis in both lower lobes. Mild biapical pleural parenchymal scarring. No consolidation, pulmonary  mass or suspicious nodule.   No acute abnormality in the included upper abdomen.   There are no acute or suspicious osseous abnormalities. Unchanged exaggerated thoracic kyphosis with associated degenerative change.   Review of the MIP images  confirms the above findings.   IMPRESSION: 1. No pulmonary embolus. 2. Mild multi chamber cardiomegaly without pulmonary edema.  Recent Labs: 07/24/2021: TSH 0.988 09/25/2021: ALT 16; BUN 15; Creatinine, Ser 1.03; Hemoglobin 11.9; Platelets 294; Potassium 3.4; Sodium 136  Recent Lipid Panel    Component Value Date/Time   CHOL 150 07/24/2021 1249   TRIG 51 07/24/2021 1249   HDL 69 07/24/2021 1249   CHOLHDL 2.2 07/24/2021 1249   LDLCALC 70 07/24/2021 1249   LDLDIRECT 71 07/24/2021 1249     Physical Exam:    VS:  BP 136/80    Pulse 79    Ht 5' 7" (1.702 m)    Wt 224 lb 3.2 oz (101.7 kg)    SpO2 97%    BMI 35.11 kg/m     Wt Readings from Last 3 Encounters:  10/26/21 224 lb 3.2 oz (101.7 kg)  09/26/21 227 lb (103 kg)  07/30/21 227 lb (103 kg)     GEN:  Well nourished, well developed in no acute distress HEENT: Normal NECK: No JVD; No carotid bruits CARDIAC: RRR, 1/6 systolic murmur. No rubs or gallops RESPIRATORY:  Clear to auscultation without rales, wheezing or rhonchi  ABDOMEN: Soft, non-tender, non-distended MUSCULOSKELETAL:  Significant lymphedema with L>>R (chronic) SKIN: Warm and dry NEUROLOGIC:  Alert and oriented x 3 PSYCHIATRIC:  Normal affect   ASSESSMENT:    1. Lymphedema   2. Medication management   3. Chronic venous insufficiency   4. Pure hypercholesterolemia   5. Diastolic dysfunction   6. Type 2 diabetes mellitus without complication, without long-term current use of insulin (HCC)    PLAN:    In order of problems listed above:  #Chronic Lymphedema #Chronic Venous Insufficiency #Cellulitis: Diagnosed at age 83. Patient with severe, bilateral LE with L>>R. Followed by vascular and currently using  compression stockings, lasix, feet elevation with plans for pneumatic compression devices. Has had complications with cellulitis in the past. -Continue follow-up with vascular as scheduled -Continue compression stockings, feet elevation -Encouraged her to take her lasix 89m BID x3 days and then daily thereafter   #Diastolic dysfunction: Patient with normal EF and G1DD on TTE. Suspect she was diagnosed with chronic diastolic HF in the setting of LE edema which is truly secondary to lymphedema. Per patient, she has never been hospitalized for HF or had pulmonary edema. Will continue to watch her blood pressure closely, but has been wall controlled. Will continue to monitor for symptoms -Continue lasix 486mBID x3 days and then lasix 4073maily thereafter -Will take K 94m22mith each dose of lasix -BMET next week -Encouraged compliance with lasix as this will help reduce recurrence of cellulitis   #DMII: Managed by PCP -Continue glipizide   #HLD: Ca score 18 (54%). -Continue crestor 94mg60mly -Lipid panel at next visit  #OSA: -Mild on sleep study; not currently on CPAP  45 minutes spent with the patient discussing findings and plan.  Medication Adjustments/Labs and Tests Ordered: Current medicines are reviewed at length with the patient today.  Concerns regarding medicines are outlined above.  Orders Placed This Encounter  Procedures   Basic metabolic panel   Meds ordered this encounter  Medications   furosemide (LASIX) 40 MG tablet    Sig: Take 1 tablet by mouth twice a day X's 3 days then go down to 1 tablet by mouth daily    Dispense:  96 tablet    Refill:  3    Patient Instructions  Medication Instructions:  Your physician has recommended you make the following change in your medication:   START Lasix 40 mg taking 1 tablet twice a day for 3 days then go down to 1 tablet daily  INCREASE the Potassium to twice a day for 3 days then go back down to 1 tablet daily   *If  you need a refill on your cardiac medications before your next appointment, please call your pharmacy*   Lab Work: 1 WEEK:  BMET  If you have labs (blood work) drawn today and your tests are completely normal, you will receive your results only by: [place] (if you have MyChart) OR A paper copy in the mail If you have any lab test that is abnormal or we need to change your treatment, we will call you to review the results.   Testing/Procedures: None ordered   Follow-Up: At [facility], you and your health needs are our priority.  As part of our continuing mission to provide you with exceptional heart care, we have created designated Provider Care Teams.  These Care Teams include your primary Cardiologist (physician) and Advanced Practice Providers (APPs -  Physician Assistants and Nurse Practitioners) who all work together to provide you with the care you need, when you need it.  We recommend signing up for the patient portal called "MyChart".  Sign up information is provided on this After Visit Summary.  MyChart is used to connect with patients for Virtual Visits (Telemedicine).  Patients are able to view lab/test results, encounter notes, upcoming appointments, etc.  Non-urgent messages can be sent to your provider as well.   To learn more about what you can do with MyChart, go to [email or web address].    Your next appointment:   Already scheduled   The format for your next appointment:   In Person  Provider:   Freada Bergeron, MD    Other Instructions    Signed, Freada Bergeron, MD  10/26/2021 5:46 PM    [place]

## 2021-10-22 NOTE — Telephone Encounter (Signed)
No PA required for HST for [facility]

## 2021-10-24 ENCOUNTER — Encounter (INDEPENDENT_AMBULATORY_CARE_PROVIDER_SITE_OTHER): Payer: Medicare PPO | Admitting: Cardiology

## 2021-10-24 DIAGNOSIS — G4733 Obstructive sleep apnea (adult) (pediatric): Secondary | ICD-10-CM | POA: Diagnosis not present

## 2021-10-26 ENCOUNTER — Ambulatory Visit: Payer: Medicare PPO

## 2021-10-26 ENCOUNTER — Other Ambulatory Visit: Payer: Self-pay

## 2021-10-26 ENCOUNTER — Encounter: Payer: Self-pay | Admitting: Physician Assistant

## 2021-10-26 ENCOUNTER — Telehealth: Payer: Self-pay | Admitting: *Deleted

## 2021-10-26 ENCOUNTER — Ambulatory Visit (INDEPENDENT_AMBULATORY_CARE_PROVIDER_SITE_OTHER): Payer: Medicare PPO | Admitting: Cardiology

## 2021-10-26 ENCOUNTER — Encounter: Payer: Self-pay | Admitting: Cardiology

## 2021-10-26 VITALS — BP 136/80 | HR 79 | Ht 67.0 in | Wt 224.2 lb

## 2021-10-26 DIAGNOSIS — I890 Lymphedema, not elsewhere classified: Secondary | ICD-10-CM | POA: Diagnosis not present

## 2021-10-26 DIAGNOSIS — Z79899 Other long term (current) drug therapy: Secondary | ICD-10-CM | POA: Diagnosis not present

## 2021-10-26 DIAGNOSIS — I872 Venous insufficiency (chronic) (peripheral): Secondary | ICD-10-CM | POA: Diagnosis not present

## 2021-10-26 DIAGNOSIS — E7800 Pure hypercholesterolemia, unspecified: Secondary | ICD-10-CM

## 2021-10-26 DIAGNOSIS — I5189 Other ill-defined heart diseases: Secondary | ICD-10-CM

## 2021-10-26 DIAGNOSIS — R0683 Snoring: Secondary | ICD-10-CM

## 2021-10-26 DIAGNOSIS — E119 Type 2 diabetes mellitus without complications: Secondary | ICD-10-CM

## 2021-10-26 MED ORDER — FUROSEMIDE 40 MG PO TABS: ORAL_TABLET | ORAL | 3 refills | Status: DC

## 2021-10-26 NOTE — Progress Notes (Signed)
Sleep study report reviewed. Dr. Radford Pax attached the notation they are starting CPAP. Per clinic protocol, sleep team will contact patient to discuss results and next steps.

## 2021-10-26 NOTE — Patient Instructions (Signed)
Medication Instructions:  Your physician has recommended you make the following change in your medication:   START Lasix 40 mg taking 1 tablet twice a day for 3 days then go down to 1 tablet daily  INCREASE the Potassium to twice a day for 3 days then go back down to 1 tablet daily   *If you need a refill on your cardiac medications before your next appointment, please call your pharmacy*   Lab Work: 1 WEEK:  BMET  If you have labs (blood work) drawn today and your tests are completely normal, you will receive your results only by: [place] (if you have MyChart) OR A paper copy in the mail If you have any lab test that is abnormal or we need to change your treatment, we will call you to review the results.   Testing/Procedures: None ordered   Follow-Up: At [facility], you and your health needs are our priority.  As part of our continuing mission to provide you with exceptional heart care, we have created designated Provider Care Teams.  These Care Teams include your primary Cardiologist (physician) and Advanced Practice Providers (APPs -  Physician Assistants and Nurse Practitioners) who all work together to provide you with the care you need, when you need it.  We recommend signing up for the patient portal called "MyChart".  Sign up information is provided on this After Visit Summary.  MyChart is used to connect with patients for Virtual Visits (Telemedicine).  Patients are able to view lab/test results, encounter notes, upcoming appointments, etc.  Non-urgent messages can be sent to your provider as well.   To learn more about what you can do with MyChart, go to [email or web address].    Your next appointment:   Already scheduled   The format for your next appointment:   In Person  Provider:   Freada Bergeron, MD    Other Instructions

## 2021-10-26 NOTE — Procedures (Signed)
° °  Sleep Study Report  Patient Information Study Date: 10/24/21 Patient Name: Jessica Yates Patient ID: [id number] Birth Date: 2051-08-29 Age: 71 Gender: Female BMI: 35.6 (W=227 lb, H=5' 7'') Neck Circ.: 56 '' Referring Physician: Melina Copa, PA  TEST DESCRIPTION: Home sleep apnea testing was completed using the WatchPat, a Type 1 device, utilizing peripheral arterial tonometry (PAT), chest movement, actigraphy, pulse oximetry, pulse rate, body position and snore. AHI was calculated with apnea and hypopnea using valid sleep time as the denominator. RDI includes apneas, hypopneas, and RERAs. The data acquired and the scoring of sleep and all associated events were performed in accordance with the recommended standards and specifications as outlined in the AASM Manual for the Scoring of Sleep and Associated Events 2.2.0 (2015).  FINDINGS: 1. Mild Obstructive Sleep Apnea with AHI 9.7/hr. 2. No Central Sleep Apnea with pAHIc 0.9/hr. 3. Oxygen desaturations as low as 83%. 4. Severe snoring was present. O2 sats were < 88% for 1.2 min. 5. Total sleep time was 3 hrs and 31 min. 6. 13.4% of total sleep time was spent in REM sleep. 7. Shortened sleep onset latency at 6 min. 8. Normal REM sleep onset latency at 88 min. 9. Total awakenings were 27.  DIAGNOSIS: Mild Obstructive Sleep Apnea (G47.33)  RECOMMENDATIONS: 1. Clinical correlation of these findings is necessary. The decision to treat obstructive sleep apnea (OSA) is usually based on the presence of apnea symptoms or the presence of associated medical conditions such as Hypertension, Congestive Heart Failure, Atrial Fibrillation or Obesity. The most common symptoms of OSA are snoring, gasping for breath while sleeping, daytime sleepiness and fatigue.  2. Initiating apnea therapy is recommended given the presence of symptoms and/or associated conditions. Recommend proceeding with one of the following:   a. Auto-CPAP therapy with  a pressure range of 5-20cm H2O.   b. An oral appliance (OA) that can be obtained from certain dentists with expertise in sleep medicine. These are primarily of use in non-obese patients with mild and moderate disease.   c. An ENT consultation which may be useful to look for specific causes of obstruction and possible treatment options.   d. If patient is intolerant to PAP therapy, consider referral to ENT for evaluation for hypoglossal nerve stimulator.  3. Close follow-up is necessary to ensure success with CPAP or oral appliance therapy for maximum benefit .  4. A follow-up oximetry study on CPAP is recommended to assess the adequacy of therapy and determine the need for supplemental oxygen or the potential need for Bi-level therapy. An arterial blood gas to determine the adequacy of baseline ventilation and oxygenation should also be considered.  5. Healthy sleep recommendations include: adequate nightly sleep (normal 7-9 hrs/night), avoidance of caffeine after noon and alcohol near bedtime, and maintaining a sleep environment that is cool, dark and quiet.  6. Weight loss for overweight patients is recommended. Even modest amounts of weight loss can significantly improve the severity of sleep apnea.  7. Snoring recommendations include: weight loss where appropriate, side sleeping, and avoidance of alcohol before bed.  8. Operation of motor vehicle should be avoided when sleepy.  Signature: Electronically Signed: 10/26/21 Fransico Him, MD; [facility]; [place], American Board of Sleep Medicine

## 2021-10-26 NOTE — Telephone Encounter (Signed)
Gershon Cull, CMA called me this morning stating that the pt called and left a vm on her phone about her sleep study. I see that the pt was seen in the office today with Dr. Johney Frame and looks like questions may have been answered  during ov today. I do also see a note from Melina Copa, [facility] that sleep team Gershon Cull, CMA and Barry Brunner, CMA will call the pt with the sleep results.

## 2021-10-26 NOTE — Telephone Encounter (Signed)
I received a voicemail today that was left on Friday that was meant for Arbie Cookey from this patient stating she had more questions about her itamar study. She was planning on taking her test on Friday night but was wanting to speak to [place] before doing so. I have spoken with Arbie Cookey about the message and she will contact the patient.

## 2021-11-02 ENCOUNTER — Telehealth: Payer: Self-pay | Admitting: *Deleted

## 2021-11-02 DIAGNOSIS — G4733 Obstructive sleep apnea (adult) (pediatric): Secondary | ICD-10-CM

## 2021-11-02 NOTE — Telephone Encounter (Signed)
The patient has been notified of the result Left detailed message on voicemail and informed patient to call back.Jessica Yates, [place] 11/02/2021 5:32 PM    Upon patient request DME selection is Jessica Yates Patient understands he will be contacted by [facility] to set up his cpap. Patient understands to call if Jessica Yates does not contact him with new setup in a timely manner. Patient understands they will be called once confirmation has been received from [employer]/ that they have received their new machine to schedule 10 week follow up appointment.   [facility] notified of new cpap order  Please add to airview Patient was grateful for the call and thanked me.

## 2021-11-02 NOTE — Telephone Encounter (Signed)
-----   Message from Lauralee Evener, [place] sent at 10/27/2021  3:40 PM EST -----  ----- Message ----- From: Sueanne Margarita, MD Sent: 10/26/2021   9:09 AM EST To: Cv Div Sleep Studies  Order ResMed CPAP on auto from 4 to 15cm H2O with heated humidity, mask of choice and get an overnight pulse ox on PAP therapy.  Followup in 6 weeks with me.

## 2021-11-03 ENCOUNTER — Other Ambulatory Visit: Payer: Medicare PPO | Admitting: *Deleted

## 2021-11-03 ENCOUNTER — Other Ambulatory Visit: Payer: Self-pay

## 2021-11-03 DIAGNOSIS — E7800 Pure hypercholesterolemia, unspecified: Secondary | ICD-10-CM

## 2021-11-03 NOTE — Telephone Encounter (Signed)
Patient is returning call.  °

## 2021-11-04 LAB — LIPID PANEL
Chol/HDL Ratio: 1.9 ratio (ref 0.0–4.4)
Cholesterol, Total: 166 mg/dL (ref 100–199)
HDL: 87 mg/dL (ref >39)
LDL Chol Calc (NIH): 71 mg/dL (ref 0–99)
Triglycerides: 35 mg/dL (ref 0–149)
VLDL Cholesterol Cal: 8 mg/dL (ref 5–40)

## 2021-11-04 LAB — COMPREHENSIVE METABOLIC PANEL
ALT: 10 IU/L (ref 0–32)
AST: 13 IU/L (ref 0–40)
Albumin/Globulin Ratio: 1 — ABNORMAL LOW (ref 1.2–2.2)
Albumin: 4.3 g/dL (ref 3.7–4.7)
Alkaline Phosphatase: 112 IU/L (ref 44–121)
BUN/Creatinine Ratio: 18 (ref 12–28)
BUN: 20 mg/dL (ref 8–27)
Bilirubin Total: 0.2 mg/dL (ref 0.0–1.2)
CO2: 23 mmol/L (ref 20–29)
Calcium: 11 mg/dL — ABNORMAL HIGH (ref 8.7–10.3)
Chloride: 100 mmol/L (ref 96–106)
Creatinine, Ser: 1.09 mg/dL — ABNORMAL HIGH (ref 0.57–1.00)
Globulin, Total: 4.4 g/dL (ref 1.5–4.5)
Glucose: 182 mg/dL — ABNORMAL HIGH (ref 70–99)
Potassium: 4.6 mmol/L (ref 3.5–5.2)
Sodium: 139 mmol/L (ref 134–144)
Total Protein: 8.7 g/dL — ABNORMAL HIGH (ref 6.0–8.5)
eGFR: 54 mL/min/{1.73_m2} — ABNORMAL LOW (ref >59)

## 2021-11-06 ENCOUNTER — Telehealth: Payer: Self-pay | Admitting: Physician Assistant

## 2021-11-06 DIAGNOSIS — E7800 Pure hypercholesterolemia, unspecified: Secondary | ICD-10-CM

## 2021-11-06 DIAGNOSIS — Z79899 Other long term (current) drug therapy: Secondary | ICD-10-CM

## 2021-11-06 MED ORDER — ROSUVASTATIN CALCIUM 40 MG PO TABS: 40.0000 mg | ORAL_TABLET | Freq: Every day | ORAL | 3 refills | Status: DC

## 2021-11-06 NOTE — Telephone Encounter (Signed)
Called pt in regards to pulmonology referral. Informed pt of reason for referral per DD note. Then reviewed echo and calcium score results with pt from Dec 2022 per pt request.  Pt then request results from recent lab work.  Informed pt of DD recommendation to increase rosuvastatin to 20 mg PO QD.  Also, informed pt of reason for increase X3.  Will send in script pt will come in for f/u FLP,LFT on 01/14/22.  Advised pt to fast prior to lab draw pt to come in anytime during office hours.  All questions answered.

## 2021-11-06 NOTE — Telephone Encounter (Signed)
Patient calling to speak with Dayna about a referral to a pulmonologist. She says she has some questions about the referral and would like a call back Monday morning. She says if she doesn't answer her cell phone to call her home number.

## 2021-11-06 NOTE — Telephone Encounter (Signed)
Returned patients call. Patient agreeable to treatment.

## 2021-11-09 ENCOUNTER — Institutional Professional Consult (permissible substitution): Payer: Medicare PPO | Admitting: Internal Medicine

## 2021-11-09 NOTE — Progress Notes (Unsigned)
Subjective:    Patient ID: Jessica Yates, female    DOB: [birth year],    MRN: [id number]  HPI  60   yobf never smoker with  Onset in her 27s seasonal esp spring summer /early fall rhinitis rx by Dr Gerilyn Nestle on allergy shots for at least  one course and took breathing treatments and ? maint inhaler but doesn't know name (record indicates was advair 100 at one point around 2012)  with onset recurrent persist cough winter 2017 referred to pulmonary clinic 05/07/2016 by Dr Alphonzo Grieve at  [facility] clinic.   Note Dr Annamaria Boots saw pt 01/26/2011 with rec trial of symbicort and f/u pfts but did not return and doesn't recall whether any inhaler ever really helped her symptoms which did include cough per the notes in EPIC with reference (not in epic) to spirometry 2005 with mild reversible obstruction.    05/07/2016 1st [facility] office visit/ Perrie Ragin   Chief Complaint  Patient presents with   pulmonary consult    last seen CY in 2012. pt c/o dry cough at times prod with clear mucus & slight chest discomfort with coughing spells X64mo  typically coughs at hs but no that much noct and during the day sporadic x winter of 2017 typically dry, worse with voice use and not clearly correlating with rhinitis symptoms which are actually better than prev years when she didn't have the cough at all. Rec Prednisone 10 mg take  4 each am x 2 days,   2 each am x 2 days,  1 each am x 2 days and stop (this is to eliminate allergies and inflammation from coughing) Protonix (pantoprazole) Take 30-60 min before first meal of the day and Pepcid 20 mg one bedtime plus chlorpheniramine 4 mg x 2 at bedtime (both available over the counter)  until cough is completely gone for at least a week without the need for cough suppression  GERD diet reviewed, bed blocks rec      11/09/2021  Re-establish ov/Kiarrah Rausch re: ***   maint on ***  No chief complaint on file.   Dyspnea:  *** Cough: *** Sleeping: *** SABA use: *** 02:  *** Covid status:   ***   No obvious day to day or daytime variability or assoc excess/ purulent sputum or mucus plugs or hemoptysis or cp or chest tightness, subjective wheeze or overt sinus or hb symptoms.   *** without nocturnal  or early am exacerbation  of respiratory  c/o's or need for noct saba. Also denies any obvious fluctuation of symptoms with weather or environmental changes or other aggravating or alleviating factors except as outlined above   No unusual exposure hx or h/o childhood pna/ asthma or knowledge of premature birth.  Current Allergies, Complete Past Medical History, Past Surgical History, Family History, and Social History were reviewed in [employer] record.  ROS  The following are not active complaints unless bolded Hoarseness, sore throat, dysphagia, dental problems, itching, sneezing,  nasal congestion or discharge of excess mucus or purulent secretions, ear ache,   fever, chills, sweats, unintended wt loss or wt gain, classically pleuritic or exertional cp,  orthopnea pnd or arm/hand swelling  or leg swelling, presyncope, palpitations, abdominal pain, anorexia, nausea, vomiting, diarrhea  or change in bowel habits or change in bladder habits, change in stools or change in urine, dysuria, hematuria,  rash, arthralgias, visual complaints, headache, numbness, weakness or ataxia or problems with walking or coordination,  change in mood or  memory.        No outpatient medications have been marked as taking for the 11/09/21 encounter (Appointment) with Tanda Rockers, MD.             Objective:   Physical Exam  wts  11/09/2021        ***  05/07/16 210 lb (95.3 kg)  04/19/16 219 lb (99.3 kg)  01/07/16 235 lb (106.6 kg)     Vital signs reviewed  11/09/2021  - Note at rest 02 sats  ***% on ***   General appearance:    ***     Upper and lower partial dentulures,              Assessment & Plan:

## 2021-11-09 NOTE — Telephone Encounter (Signed)
Correction from note below, pt will increase Rosuvastatin to 40 mg daily.

## 2021-11-10 NOTE — Telephone Encounter (Signed)
Pt has been made aware of her calcium levels and to follow-up with her PCP. Labs have been sent to Dr. Fransico Setters office.

## 2021-11-11 ENCOUNTER — Ambulatory Visit: Payer: Medicare PPO | Admitting: Cardiology

## 2021-11-17 ENCOUNTER — Ambulatory Visit: Payer: Medicare PPO | Admitting: Nurse Practitioner

## 2021-11-18 ENCOUNTER — Other Ambulatory Visit: Payer: Self-pay | Admitting: Physician Assistant

## 2021-12-01 ENCOUNTER — Ambulatory Visit: Payer: Medicare PPO | Admitting: Cardiology

## 2021-12-01 ENCOUNTER — Encounter: Payer: Self-pay | Admitting: Cardiology

## 2021-12-01 ENCOUNTER — Other Ambulatory Visit: Payer: Self-pay

## 2021-12-01 VITALS — BP 110/60 | HR 78 | Ht 67.0 in | Wt 220.2 lb

## 2021-12-01 DIAGNOSIS — I5189 Other ill-defined heart diseases: Secondary | ICD-10-CM | POA: Diagnosis not present

## 2021-12-01 DIAGNOSIS — G4733 Obstructive sleep apnea (adult) (pediatric): Secondary | ICD-10-CM | POA: Diagnosis not present

## 2021-12-01 DIAGNOSIS — I890 Lymphedema, not elsewhere classified: Secondary | ICD-10-CM | POA: Diagnosis not present

## 2021-12-01 NOTE — Patient Instructions (Signed)
Medication Instructions:  ?Your physician recommends that you continue on your current medications as directed. Please refer to the Current Medication list given to you today. ? ?*If you need a refill on your cardiac medications before your next appointment, please call your pharmacy* ? ?Testing/Procedures: ?Your physician has recommended that you have a sleep study. This test records several body functions during sleep, including: brain activity, eye movement, oxygen and carbon dioxide blood levels, heart rate and rhythm, breathing rate and rhythm, the flow of air through your mouth and nose, snoring, body muscle movements, and chest and belly movement. ? ?Follow-Up: ?At [facility], you and your health needs are our priority.  As part of our continuing mission to provide you with exceptional heart care, we have created designated Provider Care Teams.  These Care Teams include your primary Cardiologist (physician) and Advanced Practice Providers (APPs -  Physician Assistants and Nurse Practitioners) who all work together to provide you with the care you need, when you need it. ? ?Follow up with Dr. Radford Pax after sleep study ? ?

## 2021-12-01 NOTE — Progress Notes (Signed)
?Cardiology Office Note:   ? ?Date:  12/02/2021  ? ?ID:  Jessica Yates, DOB 03/22/1951, MRN [id number] ? ?PCP:  Lucianne Lei, MD  ?Cardiologist:  Freada Bergeron, MD   ? ?Referring MD: Lucianne Lei, MD  ? ?Chief Complaint  ?Patient presents with  ? Sleep Apnea  ? ? ?History of Present Illness:   ? ?Jessica Yates is a 71 y.o. female with a hx of asthma, anxiety, chronic diastolic CHF, CKD stage III, diabetes type 2 who was referred for sleep study.  She underwent an MR home sleep study showing mild obstructive sleep apnea with an AHI of 9.7/h, no central sleep apnea and O2 saturations as low as 83% but did not meet criteria for nocturnal hypoxemia.  She is now here to discuss results and further treatment.  She tells me that she does not have any problems with excessive daytime sleepiness and that she does not want to go on CPAP therapy.  She also told me that she did not do her sleep study at night she did it during the day because she was worried she could not figure out at night.  She says she catnap while she wore it. ? ?Past Medical History:  ?Diagnosis Date  ? Allergic rhinitis   ? Anemia 2002  ? Anxiety   ? Arthritis   ? Asthma   ? Blood clot in vein   ? Cellulitis   ? Cellulitis of right lower extremity   ? Chronic diastolic (congestive) heart failure (HCC)   ? Chronic kidney disease, stage 3 (HCC)   ? Coagulation defect ()   ? Cough   ? Depression 02/06/2008  ? Diabetes mellitus, type 2 ([place])   ? DVT (deep venous thrombosis) ([place]) 2002  ? left leg  ? Dysphagia   ? Elevated hemoglobin A1c   ? GERD (gastroesophageal reflux disease)   ? Glaucoma   ? Holosystolic murmur   ? Kidney cysts   ? Leukocytosis   ? Liver cyst   ? Lymphedema   ? Morbid obesity ([place])   ? Overactive bladder   ? Palpitations   ? Peripheral venous insufficiency   ? Primary open angle glaucoma (POAG) of both eyes, mild stage   ? Seasonal allergies   ? Sickle cell trait ([place])   ? Tortuous aorta (HCC)   ? Unstable gait   ? Upper  airway cough syndrome   ? Vitamin D deficiency   ? ? ?Past Surgical History:  ?Procedure Laterality Date  ? COSMETIC SURGERY  1975  ? nasal reconstruction  ? TOTAL ABDOMINAL HYSTERECTOMY  07/2001  ? ? ?Current Medications: ?Current Meds  ?Medication Sig  ? acetaminophen (TYLENOL) 500 MG tablet Take 1,000 mg by mouth every 6 (six) hours as needed for mild pain.   ? aspirin EC 81 MG tablet Take 1 tablet (81 mg total) by mouth daily. Swallow whole.  ? blood glucose meter kit and supplies KIT Dispense based on patient and insurance preference. Use up to four times daily as directed. (FOR ICD-9 250.00, 250.01).  ? cetirizine (ZYRTEC) 10 MG tablet Take 10 mg by mouth daily.  ? famotidine (PEPCID) 40 MG tablet Take 1 tablet (40 mg total) by mouth at bedtime.  ? fluticasone (FLONASE) 50 MCG/ACT nasal spray Place 1-2 sprays into both nostrils daily as needed for allergies or rhinitis.   ? furosemide (LASIX) 40 MG tablet Take 1 tablet by mouth twice a day X's 3 days then go down to  1 tablet by mouth daily  ? glipiZIDE (GLUCOTROL XL) 2.5 MG 24 hr tablet Take 2.5 mg by mouth every morning.  ? potassium chloride SA (K-DUR,KLOR-CON) 20 MEQ tablet Take 20 mEq by mouth daily.   ? RABEprazole (ACIPHEX) 20 MG tablet TAKE 1 TABLET (20 MG TOTAL) BY MOUTH 2 (TWO) TIMES DAILY. TAKE ONE TABLET BEFORE BREAKFAST, AND ONE TABLET BEFORE DINNER  ? rosuvastatin (CRESTOR) 40 MG tablet Take 40 mg by mouth 2 (two) times daily.  ? timolol (TIMOPTIC) 0.5 % ophthalmic solution 1 drop 2 (two) times daily.  ? TRAVATAN Z 0.004 % SOLN ophthalmic solution Place 1 drop into both eyes at bedtime.   ?  ? ?Allergies:   Patient has no known allergies.  ? ?Social History  ? ?Socioeconomic History  ? Marital status: Single  ?  Spouse name: Not on file  ? Number of children: 0  ? Years of education: Not on file  ? Highest education level: Not on file  ?Occupational History  ? Occupation: retired from [employer]  ?Tobacco Use  ? Smoking status:  Never  ? Smokeless tobacco: Never  ?Vaping Use  ? Vaping Use: Never used  ?Substance and Sexual Activity  ? Alcohol use: No  ? Drug use: No  ? Sexual activity: Never  ?Other Topics Concern  ? Not on file  ?Social History Narrative  ? Lives with mother and brother  ?   ?   ? ?Social Determinants of Health  ? ?Financial Resource Strain: Not on file  ?Food Insecurity: Not on file  ?Transportation Needs: Not on file  ?Physical Activity: Not on file  ?Stress: Not on file  ?Social Connections: Not on file  ?  ? ?Family History: ?The patient's family history includes Allergies in her mother and sister; Arthritis in her mother and sister; Asthma in her mother; Birth defects in her maternal grandmother; Breast cancer in her maternal grandmother and mother; Cancer in her mother; Cancer (age of onset: 4) in her father; Diabetes in her mother; Heart disease in her maternal grandmother; Hyperlipidemia in her mother; Hypertension in her mother; Other in her mother. ? ?ROS:   ?Please see the history of present illness.    ?ROS  ?All other systems reviewed and negative.  ? ?EKGs/Labs/Other Studies Reviewed:   ? ?The following studies were reviewed today: ?Home sleep study ? ? ?Recent Labs: ?07/24/2021: TSH 0.988 ?09/25/2021: Hemoglobin 11.9; Platelets 294 ?11/03/2021: ALT 10; BUN 20; Creatinine, Ser 1.09; Potassium 4.6; Sodium 139  ? ?Recent Lipid Panel ?   ?Component Value Date/Time  ? CHOL 166 11/03/2021 1610  ? TRIG 35 11/03/2021 1610  ? HDL 87 11/03/2021 1610  ? CHOLHDL 1.9 11/03/2021 1610  ? [place] 71 11/03/2021 1610  ? LDLDIRECT 71 07/24/2021 1249  ? ?  ? ? ?Physical Exam:   ? ?VS:  BP 110/60 (BP Location: Right Arm, Patient Position: Sitting, Cuff Size: Normal)   Pulse 78   Ht $R'5\' 7"'uE$  (1.702 m)   Wt 220 lb 3.2 oz (99.9 kg)   SpO2 98%   BMI 34.49 kg/m?    ? ?Wt Readings from Last 3 Encounters:  ?12/01/21 220 lb 3.2 oz (99.9 kg)  ?10/26/21 224 lb 3.2 oz (101.7 kg)  ?09/26/21 227 lb (103 kg)  ?  ? ?GEN:  Well nourished, well  developed in no acute distress ?HEENT: Normal ?NECK: No JVD; No carotid bruits ?LYMPHATICS: No lymphadenopathy ?CARDIAC: RRR, no murmurs, rubs, gallops ?RESPIRATORY:  Clear  to auscultation without rales, wheezing or rhonchi  ?ABDOMEN: Soft, non-tender, non-distended ?MUSCULOSKELETAL:  No edema; No deformity  ?SKIN: Warm and dry ?NEUROLOGIC:  Alert and oriented x 3 ?PSYCHIATRIC:  Normal affect  ? ?ASSESSMENT:   ? ?1. OSA (obstructive sleep apnea)   ?2. Diastolic dysfunction   ?3. Lymphedema   ? ?PLAN:   ? ?In order of problems listed above: ? ?Obstructive sleep apnea ?-This was mild at 9.7/h on recent home sleep study with no significant nocturnal hypoxemia ?-I am not sure how accurate this study is as she did not do it at night she did not during the day when she was Napping ?-Given that she only had mild sleep apnea and a normal setting of doing this during normal nighttime sleeping and with lack of excessive daytime sleepiness and the fact that she does not want CPAP I would not recommend further treatment but I cannot assume that the study was accurate as it was not done during nighttime sleeping. ?-I have recommended referring her for an in lab PSG to determine the severity of her obstructive sleep apnea.  I told her if she had moderate to severe sleep apnea then I would recommend undergoing CPAP therapy. ? ?2.  Diastolic dysfunction with chronic lymphedema ?-She was found to have grade 1 diastolic dysfunction with normal EF on echo. ?-She has a history of lower extremity edema related to lymphedema ?-She does not appear volume overloaded on exam today ?-Continue prescription drug therapy with Lasix 40 mg daily with as needed refills ? ?Time Spent: ?20 minutes total time of encounter, including 15 minutes spent in face-to-face patient care on the date of this encounter. This time includes coordination of care and counseling regarding above mentioned problem list. Remainder of non-face-to-face time involved  reviewing chart documents/testing relevant to the patient encounter and documentation in the medical record. I have independently reviewed documentation from referring provider ? ?Medication Adjustments/Labs and

## 2021-12-02 ENCOUNTER — Institutional Professional Consult (permissible substitution): Payer: Medicare PPO | Admitting: Pulmonary Disease

## 2021-12-04 ENCOUNTER — Telehealth: Payer: Self-pay | Admitting: *Deleted

## 2021-12-04 ENCOUNTER — Telehealth: Payer: Self-pay | Admitting: Cardiology

## 2021-12-04 DIAGNOSIS — G4733 Obstructive sleep apnea (adult) (pediatric): Secondary | ICD-10-CM

## 2021-12-04 NOTE — Telephone Encounter (Signed)
-----   Message from Antonieta Iba, RN sent at 12/02/2021  8:17 AM EDT ----- ?FYI - we have decided to cancel repeat Itamar study on this patient and change it to an in-lab PSG study. Please pre-cert and schedule in-lab study instead.  ? ?Thanks! ? ?

## 2021-12-04 NOTE — Telephone Encounter (Signed)
Patient is calling to talk with Dr. Johney Frame or nurse in regards to the increase of her medication rosuvastatin (CRESTOR) 40 MG tablet ?

## 2021-12-04 NOTE — Telephone Encounter (Signed)
TRY LATER MAILBOX IS FULL COULD NOT LEAVE A MESSAGE ./CY ?

## 2021-12-07 ENCOUNTER — Institutional Professional Consult (permissible substitution): Payer: Medicare PPO | Admitting: Internal Medicine

## 2021-12-07 ENCOUNTER — Institutional Professional Consult (permissible substitution): Payer: Medicare PPO | Admitting: Primary Care

## 2021-12-07 MED ORDER — ROSUVASTATIN CALCIUM 40 MG PO TABS: 40.0000 mg | ORAL_TABLET | Freq: Every day | ORAL | 3 refills | Status: DC

## 2021-12-07 NOTE — Telephone Encounter (Signed)
Pt wanted to call and clarify her rosuvastatin dosing Dayna Dunn PA-C advised for her to take at last lab result on 2/22. ?Endorsed to the pt she should be taking crestor 40 mg po daily, as Melina Copa PA-C advised her to at last lab result on 2/22. ?Pt verbalized understanding and agrees with this plan. ?She will have her labs in May as well as see Dr. Johney Frame for follow-up, in which she is aware of both appts and agrees to that plan. ? ? ? ? ? ? ? Charlie Pitter, PA-C  ?11/04/2021  5:48 PM EST   ?  ?Jennifer, LDL still slightly above goal at 71. Given her DM and coronary calcium I suggest we increase Crestor to '40mg'$  daily with repeat fasting lipid/liver at OV in 01/2022 with Dr. Johney Frame. Please let pt know to f/u with her PCP as well for intermittently elevated calcium level - hold calcium supplements for now if taking and recommend she call her PCP to discuss. Will cc to Dr. Johney Frame as well since she saw patient more recently and made med adjustments. Otherwise labs largely stable.  ? ?

## 2021-12-08 ENCOUNTER — Institutional Professional Consult (permissible substitution): Payer: Medicare PPO | Admitting: Internal Medicine

## 2021-12-09 ENCOUNTER — Institutional Professional Consult (permissible substitution): Payer: Medicare PPO | Admitting: Pulmonary Disease

## 2021-12-14 ENCOUNTER — Ambulatory Visit: Payer: Medicare PPO | Admitting: Pulmonary Disease

## 2021-12-14 ENCOUNTER — Ambulatory Visit: Payer: Medicare PPO

## 2021-12-14 ENCOUNTER — Encounter: Payer: Self-pay | Admitting: Pulmonary Disease

## 2021-12-14 ENCOUNTER — Telehealth: Payer: Self-pay | Admitting: Pulmonary Disease

## 2021-12-14 VITALS — BP 134/76 | HR 83 | Temp 97.9°F | Ht 67.0 in | Wt 220.0 lb

## 2021-12-14 DIAGNOSIS — I50810 Right heart failure, unspecified: Secondary | ICD-10-CM | POA: Diagnosis not present

## 2021-12-14 MED ORDER — ALBUTEROL SULFATE HFA 108 (90 BASE) MCG/ACT IN AERS: 2.0000 | INHALATION_SPRAY | Freq: Four times a day (QID) | RESPIRATORY_TRACT | 6 refills | Status: DC | PRN

## 2021-12-14 NOTE — Progress Notes (Signed)
$'@Patient'x$  ID: Jessica Yates, female    DOB: 1950/09/25, 71 y.o.   MRN: [id number]  Chief Complaint  Patient presents with   Consult    Consult today. Had an echocardiogram due to right heart failure and was scheduled to see a pulmonary dr. Abbott Pao states she has issues breathing and some chest tightness.     Referring provider: Charlie Pitter, PA-C  HPI:   71 y.o. with severe diastolic dysfunction, GERD, diabetes, hyperlipidemia presents for evaluation of pulmonary hypertension and dyspnea on exertion.  Most recent cardiology note reviewed.  Patient with chronic dyspnea.  Made worse over the last year or 2.  Worse with inclines or stairs.  No time of day where things are better or worse.  No position makes things better or worse.  No seasonal environmental factors to identify things better or worse.  Has not used inhalers.  No other relieving or exacerbating factors.  Reviewed most recent chest imaging 08/26/2021 CT cardiac scoring is limited views of the lungs which shows clear lungs exception of right basilar atelectasis.  Reviewed echocardiogram 2019 showed normal RV size and function.  Echocardiogram 09/11/2021 reviewed which shows a dilated left atrium, RV was mildly enlarged with mildly reduced function with estimated normal pulmonary artery systolic pressure, trivial mitral valve regurg.  PMH: GERD, diabetes, hyperlipidemia Surgical history: Hysterectomy, nasal reconstruction Family history: Mother with asthma, allergies, arthritis, breast cancer, diabetes, hypertension, father with leukemia Social history: Never smoker, lives in [place] / Pulmonary Flowsheets:   ACT:      View : No data to display.          MMRC:     View : No data to display.          [facility]worth:      View : No data to display.          Tests:   FENO:  Lab Results  Component Value Date   NITRICOXIDE 17 05/07/2016    PFT:     View : No data to display.           WALK:      View : No data to display.          Imaging: No results found. Personally reviewed  Lab Results: Personally reviewed CBC    Component Value Date/Time   WBC 15.5 (H) 09/25/2021 1958   RBC 4.77 09/25/2021 1958   HGB 11.9 (L) 09/25/2021 1958   HGB 11.6 07/24/2021 1249   HGB 12.0 07/17/2008 1449   HCT 38.5 09/25/2021 1958   HCT 36.7 07/24/2021 1249   HCT 35.6 07/17/2008 1449   PLT 294 09/25/2021 1958   PLT 382 07/24/2021 1249   MCV 80.7 09/25/2021 1958   MCV 81 07/24/2021 1249   MCV 83.2 07/17/2008 1449   MCH 24.9 (L) 09/25/2021 1958   MCHC 30.9 09/25/2021 1958   RDW 17.7 (H) 09/25/2021 1958   RDW 14.8 07/24/2021 1249   RDW 14.6 (H) 07/17/2008 1449   LYMPHSABS 1.0 09/25/2021 1958   LYMPHSABS 1.0 07/17/2008 1449   MONOABS 0.4 09/25/2021 1958   MONOABS 0.2 07/17/2008 1449   EOSABS 0.0 09/25/2021 1958   EOSABS 0.1 07/17/2008 1449   BASOSABS 0.0 09/25/2021 1958   BASOSABS 0.0 07/17/2008 1449    BMET    Component Value Date/Time   NA 139 11/03/2021 1610   K 4.6 11/03/2021 1610   CL 100 11/03/2021 1610   CO2 23 11/03/2021  1610   GLUCOSE 182 (H) 11/03/2021 1610   GLUCOSE 114 (H) 09/25/2021 1958   BUN 20 11/03/2021 1610   CREATININE 1.09 (H) 11/03/2021 1610   CALCIUM 11.0 (H) 11/03/2021 1610   GFRNONAA 58 (L) 09/25/2021 1958   GFRAA 66 10/13/2020 1653    BNP No results found for: BNP  ProBNP    Component Value Date/Time   PROBNP 71.8 04/29/2011 1651    Specialty Problems       Pulmonary Problems   ALLERGIC RHINITIS    Qualifier: Diagnosis of  By: Clent Ridges NP, Tammy        Extrinsic asthma    CT 04/16/11- suggested lower esophageal thickening- potential reflux.        Cough   Upper airway cough syndrome    - Allergy profile 05/07/2016 >  Eos 0.1 /  IgE  192 Pos Grass / tree/ ragweed/ dust  - FENO 05/07/2016  =   17 - Max rx for gerd/ 1st gen h1 hs 05/07/2016 >>>       Laryngopharyngeal reflux (LPR)    No Known  Allergies  Immunization History  Administered Date(s) Administered   Influenza Split 07/15/2015   Influenza Whole 10/14/2010   PFIZER(Purple Top)SARS-COV-2 Vaccination 10/04/2019, 10/25/2019    Past Medical History:  Diagnosis Date   Allergic rhinitis    Anemia 2002   Anxiety    Arthritis    Asthma    Blood clot in vein    Cellulitis    Cellulitis of right lower extremity    Chronic diastolic (congestive) heart failure (HCC)    Chronic kidney disease, stage 3 (HCC)    Coagulation defect (HCC)    Cough    Depression 02/06/2008   Diabetes mellitus, type 2 (HCC)    DVT (deep venous thrombosis) (HCC) 2002   left leg   Dysphagia    Elevated hemoglobin A1c    GERD (gastroesophageal reflux disease)    Glaucoma    Holosystolic murmur    Kidney cysts    Leukocytosis    Liver cyst    Lymphedema    Morbid obesity (HCC)    Overactive bladder    Palpitations    Peripheral venous insufficiency    Primary open angle glaucoma (POAG) of both eyes, mild stage    Seasonal allergies    Sickle cell trait (HCC)    Tortuous aorta (HCC)    Unstable gait    Upper airway cough syndrome    Vitamin D deficiency     Tobacco History: Social History   Tobacco Use  Smoking Status Never  Smokeless Tobacco Never   Counseling given: Not Answered   Continue to not smoke  Outpatient Encounter Medications as of 12/14/2021  Medication Sig   acetaminophen (TYLENOL) 500 MG tablet Take 1,000 mg by mouth every 6 (six) hours as needed for mild pain.    albuterol (VENTOLIN HFA) 108 (90 Base) MCG/ACT inhaler Inhale 2 puffs into the lungs every 6 (six) hours as needed for wheezing or shortness of breath.   aspirin EC 81 MG tablet Take 1 tablet (81 mg total) by mouth daily. Swallow whole.   blood glucose meter kit and supplies KIT Dispense based on patient and insurance preference. Use up to four times daily as directed. (FOR ICD-9 250.00, 250.01).   cetirizine (ZYRTEC) 10 MG tablet Take 10 mg by  mouth daily.   famotidine (PEPCID) 40 MG tablet Take 1 tablet (40 mg total) by mouth at bedtime.  fluticasone (FLONASE) 50 MCG/ACT nasal spray Place 1-2 sprays into both nostrils daily as needed for allergies or rhinitis.    furosemide (LASIX) 40 MG tablet Take 1 tablet by mouth twice a day X's 3 days then go down to 1 tablet by mouth daily   glipiZIDE (GLUCOTROL XL) 2.5 MG 24 hr tablet Take 2.5 mg by mouth every morning.   potassium chloride SA (K-DUR,KLOR-CON) 20 MEQ tablet Take 20 mEq by mouth daily.    RABEprazole (ACIPHEX) 20 MG tablet TAKE 1 TABLET (20 MG TOTAL) BY MOUTH 2 (TWO) TIMES DAILY. TAKE ONE TABLET BEFORE BREAKFAST, AND ONE TABLET BEFORE DINNER   rosuvastatin (CRESTOR) 40 MG tablet Take 1 tablet (40 mg total) by mouth daily.   timolol (TIMOPTIC) 0.5 % ophthalmic solution 1 drop 2 (two) times daily.   TRAVATAN Z 0.004 % SOLN ophthalmic solution Place 1 drop into both eyes at bedtime.    [DISCONTINUED] metFORMIN (GLUCOPHAGE) 500 MG tablet Take 1 tablet (500 mg total) by mouth 2 (two) times daily with a meal. (Patient not taking: No sig reported)   No facility-administered encounter medications on file as of 12/14/2021.     Review of Systems  Review of Systems  No chest pain with exertion.  No orthopnea or PND.  Comprehensive review of systems otherwise negative. Physical Exam  BP 134/76 (BP Location: Left Arm, Patient Position: Sitting, Cuff Size: Normal)   Pulse 83   Temp 97.9 F (36.6 C) (Oral)   Ht $R'5\' 7"'lj$  (1.702 m)   Wt 220 lb (99.8 kg)   SpO2 98%   BMI 34.46 kg/m   Wt Readings from Last 5 Encounters:  12/14/21 220 lb (99.8 kg)  12/01/21 220 lb 3.2 oz (99.9 kg)  10/26/21 224 lb 3.2 oz (101.7 kg)  09/26/21 227 lb (103 kg)  07/30/21 227 lb (103 kg)    BMI Readings from Last 5 Encounters:  12/14/21 34.46 kg/m  12/01/21 34.49 kg/m  10/26/21 35.11 kg/m  09/26/21 35.55 kg/m  08/31/21 35.55 kg/m     Physical Exam General: Sitting in chair, no acute  distress Eyes: EOMI, icterus Neck: Supple, no JVP appreciated sitting upright Pulmonary: Clear, good air movement, no wheeze Cardiovascular: Regular rate and rhythm, no murmurs Abdomen: Nondistended, bowel sounds present MSK: No synovitis, joint effusion Neuro: Normal gait, no weakness Psych: Normal mood, full affect   Assessment & Plan:   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 Has OSA on PSG, likely contributing as well. Needs repeat PSG being arranged by cardiology office. PFTs ordered for further evaluation, history of LE DVT, VQ scan ordered for further evaluation.   OSA: recommend aggressive treatment with CPAP given RV dysfunction. Being managed by cardiology office.   Intermittent chest tightness, h/o asthma: not exertional. Possible asthma symptom? Trial albuterol PRN, new prescription today.  PFTs as above.   Return in about 3 months (around 03/15/2022).   Lanier Clam, MD 12/14/2021   This appointment required 60 minutes of patient care (this includes precharting, chart review, review of results, face-to-face care, etc.).

## 2021-12-14 NOTE — Patient Instructions (Signed)
Nice to meet you ? ?For further work-up I recommend pulmonary function tests to see if there is signs of asthma and also see if there is anything contributing to the heart condition ? ?I recommend a perfusion scan, we will get a chest x-ray at the same time in the coming days to weeks.  This will evaluate if there are signs of chronic or long-term clot there.  I expect this will be normal but if there are changes there we need to know about them. ? ?Lastly, I recommend using albuterol as needed for the chest tightness on the right.  If this helps let me know we will consider using a stronger inhaler in the future.  In the past you feel like inhalers have not been helpful. ? ?I think it is very important to have the sleep apnea test done again.  I will contact your cardiologist but I recommend you also contact the cardiologist office to follow-up and find out if this will be scheduled. ? ?Return to clinic in 3 months or sooner as needed with Dr. Silas Flood ?

## 2021-12-14 NOTE — Telephone Encounter (Signed)
This is fine. 30 min first avail or 3 months - whichever she prefers ?

## 2021-12-14 NOTE — Telephone Encounter (Signed)
Please advise if you are ok to change providers. She prefers to see Dr. Chase Caller.  ?

## 2021-12-15 NOTE — Telephone Encounter (Signed)
Tried calling the pt and there was no answer and her VM not available  ?Will sent to front desk pool for f/u on this appt being scheduled, thanks ?

## 2021-12-16 ENCOUNTER — Telehealth: Payer: Self-pay | Admitting: *Deleted

## 2021-12-16 NOTE — Telephone Encounter (Signed)
I am going to forward this to Dr. Theodosia Blender nurse Bethann Berkshire.  ?

## 2021-12-16 NOTE — Telephone Encounter (Signed)
Pt would like to speak with you .Marland Kitchen.. please advise ?

## 2021-12-17 NOTE — Telephone Encounter (Signed)
Patient was returning phone call. Please call back 

## 2021-12-17 NOTE — Telephone Encounter (Signed)
Attempted to call patient. Unable to leave voicemail.  

## 2021-12-17 NOTE — Telephone Encounter (Signed)
Prior Authorization for NPSG sent to [facility] via web portal.  ?[place] [id number] ?

## 2021-12-18 NOTE — Telephone Encounter (Signed)
Pt returning call to nurse. Please advise 

## 2021-12-18 NOTE — Telephone Encounter (Signed)
Pt aware Dr .Theodosia Blender  nurse will follow up with her next week. ?

## 2021-12-21 NOTE — Telephone Encounter (Signed)
Spoke with the patient in regards to sleep study that was ordered. Advised that she will need to go into the lab to have the sleep study done and we are no longer doing a repeat home sleep study. Advised that Jessica Yates would be contacting her soon to get it scheduled. Patient verbalized understanding.  ?

## 2021-12-22 ENCOUNTER — Encounter (HOSPITAL_COMMUNITY): Admission: RE | Admit: 2021-12-22 | Payer: Medicare PPO | Source: Ambulatory Visit

## 2021-12-22 ENCOUNTER — Ambulatory Visit (HOSPITAL_COMMUNITY): Admission: RE | Admit: 2021-12-22 | Payer: Medicare PPO | Source: Ambulatory Visit

## 2021-12-22 ENCOUNTER — Encounter: Payer: Self-pay | Admitting: Podiatry

## 2021-12-22 ENCOUNTER — Ambulatory Visit: Payer: Medicare PPO | Admitting: Podiatry

## 2021-12-22 DIAGNOSIS — M79674 Pain in right toe(s): Secondary | ICD-10-CM

## 2021-12-22 DIAGNOSIS — B351 Tinea unguium: Secondary | ICD-10-CM

## 2021-12-22 DIAGNOSIS — M79675 Pain in left toe(s): Secondary | ICD-10-CM

## 2021-12-23 NOTE — Telephone Encounter (Signed)
Reviewed patient's chart. She has been scheduled to see MR on 02/09/22. Will close this encounter.  ?

## 2021-12-29 ENCOUNTER — Encounter: Payer: Self-pay | Admitting: Podiatry

## 2021-12-29 NOTE — Progress Notes (Signed)
?  Subjective:  ?Patient ID: Jessica Yates, female    DOB: December 31, 1950,  MRN: [id number] ? ?Jessica Yates presents to clinic today for preventative diabetic foot care and painful elongated mycotic toenails 1-5 bilaterally which are tender when wearing enclosed shoe gear. Pain is relieved with periodic professional debridement. ? ?Patient states blood glucose was 124 mg/dl today.  Last HgA1c was 6.4%. ? ?New problem(s): None.  ? ?PCP is Lucianne Lei, MD , and last visit was March, 2023. ? ?No Known Allergies ? ?Review of Systems: Negative except as noted in the HPI. ? ?Objective: No changes noted in today's physical examination. ?General: Patient is a pleasant 71 y.o. African American female obese in NAD. AAO x 3.  ? ?Neurovascular Examination: ?Capillary fill time to digits <3 seconds b/l lower extremities. Faintly palpable DP pulse(s) b/l lower extremities. Nonpalpable PT pulse(s) b/l lower extremities. Pedal hair absent. Lower extremity skin temperature gradient within normal limits. No pain with calf compression b/l. Lymphedema present b/l lower extremities. ? ?Protective sensation intact 5/5 intact bilaterally with 10g monofilament b/l. ? ?Dermatological:  ?No open wounds b/l lower extremities. No interdigital macerations b/l lower extremities. Toenails 1-5 b/l elongated, discolored, dystrophic, thickened, crumbly with subungual debris and tenderness to dorsal palpation.Hyperpigmentation consistent with findings of chronic venous insufficiency is present b/l lower extremities. Skin b/l lower extremities noted to be thickened and brawny consistent with lymphedema.  ? ?Musculoskeletal:  ?Normal muscle strength 5/5 to all lower extremity muscle groups bilaterally. Pes planus deformity noted b/l lower extremities. ? ? ?Assessment/Plan: ?1. Pain due to onychomycosis of toenails of both feet   ?  ?-Examined patient. ?-Continue diabetic foot care principles: inspect feet daily, monitor glucose as recommended by  PCP and/or Endocrinologist, and follow prescribed diet per PCP, Endocrinologist and/or dietician. ?-Mycotic toenails 1-5 bilaterally were debrided in length and girth with sterile nail nippers and dremel without incident. ?-Patient/POA to call should there be question/concern in the interim.  ? ?Return in about 3 months (around 03/23/2022). ? ?Marzetta Board, DPM  ?

## 2022-01-01 ENCOUNTER — Encounter (HOSPITAL_COMMUNITY)
Admission: RE | Admit: 2022-01-01 | Discharge: 2022-01-01 | Disposition: A | Discharge status: Home / Self Care | Payer: Medicare PPO | Source: Ambulatory Visit | Attending: Pulmonary Disease | Admitting: Pulmonary Disease

## 2022-01-01 ENCOUNTER — Ambulatory Visit (HOSPITAL_COMMUNITY)
Admission: RE | Admit: 2022-01-01 | Discharge: 2022-01-01 | Disposition: A | Discharge status: Home / Self Care | Payer: Medicare PPO | Source: Ambulatory Visit | Attending: Pulmonary Disease | Admitting: Pulmonary Disease

## 2022-01-01 DIAGNOSIS — I50810 Right heart failure, unspecified: Secondary | ICD-10-CM | POA: Insufficient documentation

## 2022-01-01 MED ORDER — TECHNETIUM TO 99M ALBUMIN AGGREGATED
4.0000 | Freq: Once | INTRAVENOUS | Status: AC | PRN
  Administered 2022-01-01: 4 via INTRAVENOUS

## 2022-01-07 ENCOUNTER — Telehealth: Payer: Self-pay | Admitting: Pulmonary Disease

## 2022-01-07 NOTE — Telephone Encounter (Signed)
Called patient and got verbal confirmation from Dr Silas Flood that patient chest xray show no abnormalities. Patient verbalized understanding and had no questions. Nothing further needed at this time  ?

## 2022-01-07 NOTE — Progress Notes (Signed)
No evidence of blood clots on perfusion scan.

## 2022-01-13 ENCOUNTER — Telehealth: Payer: Self-pay | Admitting: Cardiology

## 2022-01-13 NOTE — Telephone Encounter (Signed)
Will forward this call to [facility] [place] for further follow-up with the pt, being Melina Copa PA-C ordered labs on this pt.  ?

## 2022-01-13 NOTE — Telephone Encounter (Signed)
Pt would like for nurse to give her a call back to explain what all is being checked as far as her labs for tomorrow. Please advise ?

## 2022-01-14 ENCOUNTER — Other Ambulatory Visit: Payer: Medicare PPO

## 2022-01-14 ENCOUNTER — Other Ambulatory Visit: Payer: Self-pay | Admitting: Nurse Practitioner

## 2022-01-14 DIAGNOSIS — Z79899 Other long term (current) drug therapy: Secondary | ICD-10-CM

## 2022-01-14 DIAGNOSIS — E7800 Pure hypercholesterolemia, unspecified: Secondary | ICD-10-CM

## 2022-01-14 NOTE — Telephone Encounter (Signed)
Spoke with patient and made her aware that Dayna was checking her her liver function and cholesterol. She voiced understanding and stated she will be coming to the office in the next thirty minutes. ?

## 2022-01-14 NOTE — Progress Notes (Deleted)
?Cardiology Office Note:   ? ?Date:  01/18/2022  ? ?ID:  Jessica Yates, DOB 29-Apr-1951, MRN [id number] ? ?PCP:  Lucianne Lei, MD  ?[facility] Cardiologist:  Freada Bergeron, MD  ?[facility] Electrophysiologist:  None  ? ?Referring MD: No ref. provider found  ? ? ?History of Present Illness:   ? ?Jessica Yates is a 71 y.o. female with a hx of asthma, diastolic dysfunction, chronic lymphedema, DMII, CKD stage 3, and DVT who presents to clinic for follow-up. ?  ?Patient has known history of chronic lymphedema (started at age 23) and has recurrent cellulitis managed by vascular. Has difficulty ambulating due to LE swelling and arthritis. Walks with cane, but no exertional symptoms. No known history of CAD. Was told she had "congestive heart failure," but is unsure why she was diagnosed with this. States she has never been hospitalized for fluid in her lungs, worsening SOB, or chest pain. Denies chest pain, SOB on exertion, palpitations, nausea or vomiting, abdominal pain. No orthopnea or PND. For her lymphedema, she does LE wrapping, compression stockings, LE elevation, and lasix. TTE in 2019 with normal BiV function, G1DD, no valvular abnormalities.  ?  ?Last seen in clinic by Melina Copa on 07/24/21 where she was feeling well. Chronic lymphedema persisted. TTE with normal EF, mild RV dysfunction, borderline aorta 63mm. Ca score 18 (54%). ? ?Was last seen in clinic on 10/2021 where she was having LE edema in the setting of not taking her lasix as it made her urinate a lot. We increased her lasix to $Remove'40mg'XZwKelA$  BID x3 days and then daily thereafter.  ? ?Today, *** ? ? ?Past Medical History:  ?Diagnosis Date  ? Allergic rhinitis   ? Anemia 2002  ? Anxiety   ? Arthritis   ? Asthma   ? Blood clot in vein   ? Cellulitis   ? Cellulitis of right lower extremity   ? Chronic diastolic (congestive) heart failure (HCC)   ? Chronic kidney disease, stage 3 (HCC)   ? Coagulation defect ([place])   ? Cough   ? Depression  02/06/2008  ? Diabetes mellitus, type 2 ([place])   ? DVT (deep venous thrombosis) ([place]) 2002  ? left leg  ? Dysphagia   ? Elevated hemoglobin A1c   ? GERD (gastroesophageal reflux disease)   ? Glaucoma   ? Holosystolic murmur   ? Kidney cysts   ? Leukocytosis   ? Liver cyst   ? Lymphedema   ? Morbid obesity ([place])   ? Overactive bladder   ? Palpitations   ? Peripheral venous insufficiency   ? Primary open angle glaucoma (POAG) of both eyes, mild stage   ? Seasonal allergies   ? Sickle cell trait ([place])   ? Tortuous aorta (HCC)   ? Unstable gait   ? Upper airway cough syndrome   ? Vitamin D deficiency   ? ? ?Past Surgical History:  ?Procedure Laterality Date  ? COSMETIC SURGERY  1975  ? nasal reconstruction  ? TOTAL ABDOMINAL HYSTERECTOMY  07/2001  ? ? ?Current Medications: ?Current Meds  ?Medication Sig  ? acetaminophen (TYLENOL) 500 MG tablet Take 1,000 mg by mouth every 6 (six) hours as needed for mild pain.   ? albuterol (VENTOLIN HFA) 108 (90 Base) MCG/ACT inhaler Inhale 2 puffs into the lungs every 6 (six) hours as needed for wheezing or shortness of breath.  ? aspirin EC 81 MG tablet Take 1 tablet (81 mg total) by mouth daily. Swallow  whole.  ? blood glucose meter kit and supplies KIT Dispense based on patient and insurance preference. Use up to four times daily as directed. (FOR ICD-9 250.00, 250.01).  ? cetirizine (ZYRTEC) 10 MG tablet Take 10 mg by mouth daily.  ? famotidine (PEPCID) 40 MG tablet Take 1 tablet (40 mg total) by mouth at bedtime.  ? fluticasone (FLONASE) 50 MCG/ACT nasal spray Place 1-2 sprays into both nostrils daily as needed for allergies or rhinitis.   ? furosemide (LASIX) 40 MG tablet Take 1 tablet by mouth twice a day X's 3 days then go down to 1 tablet by mouth daily  ? glipiZIDE (GLUCOTROL XL) 2.5 MG 24 hr tablet Take 2.5 mg by mouth every morning.  ? potassium chloride SA (K-DUR,KLOR-CON) 20 MEQ tablet Take 20 mEq by mouth daily.   ? RABEprazole (ACIPHEX) 20 MG tablet TAKE 1 TABLET (20 MG  TOTAL) BY MOUTH 2 (TWO) TIMES DAILY. TAKE ONE TABLET BEFORE BREAKFAST, AND ONE TABLET BEFORE DINNER  ? rosuvastatin (CRESTOR) 40 MG tablet Take 1 tablet (40 mg total) by mouth daily.  ? timolol (TIMOPTIC) 0.5 % ophthalmic solution 1 drop 2 (two) times daily.  ? TRAVATAN Z 0.004 % SOLN ophthalmic solution Place 1 drop into both eyes at bedtime.   ?  ? ?Allergies:   Patient has no known allergies.  ? ?Social History  ? ?Socioeconomic History  ? Marital status: Single  ?  Spouse name: Not on file  ? Number of children: 0  ? Years of education: Not on file  ? Highest education level: Not on file  ?Occupational History  ? Occupation: retired from [employer]  ?Tobacco Use  ? Smoking status: Never  ? Smokeless tobacco: Never  ?Vaping Use  ? Vaping Use: Never used  ?Substance and Sexual Activity  ? Alcohol use: No  ? Drug use: No  ? Sexual activity: Never  ?Other Topics Concern  ? Not on file  ?Social History Narrative  ? Lives with mother and brother  ?   ?   ? ?Social Determinants of Health  ? ?Financial Resource Strain: Not on file  ?Food Insecurity: Not on file  ?Transportation Needs: Not on file  ?Physical Activity: Not on file  ?Stress: Not on file  ?Social Connections: Not on file  ?  ? ?Family History: ?The patient's family history includes Allergies in her mother and sister; Arthritis in her mother and sister; Asthma in her mother; Birth defects in her maternal grandmother; Breast cancer in her maternal grandmother and mother; Cancer in her mother; Cancer (age of onset: 14) in her father; Diabetes in her mother; Heart disease in her maternal grandmother; Hyperlipidemia in her mother; Hypertension in her mother; Other in her mother. ? ?ROS:   ?Please see the history of present illness.    ?Review of Systems  ?Constitutional:  Negative for chills and fever.  ?HENT:  Negative for congestion.   ?Eyes:  Negative for blurred vision.  ?Respiratory:  Negative for shortness of breath.   ?Cardiovascular:   Positive for leg swelling. Negative for chest pain, palpitations, orthopnea, claudication and PND.  ?Gastrointestinal:  Negative for nausea and vomiting.  ?Genitourinary:  Negative for hematuria.  ?Musculoskeletal:  Negative for falls.  ?Neurological:  Negative for dizziness and loss of consciousness.  ?Endo/Heme/Allergies:  Negative for polydipsia.  ?Psychiatric/Behavioral:  Negative for substance abuse.   ? ?EKGs/Labs/Other Studies Reviewed:   ? ?The following studies were reviewed today: ?Ca score 08/2021: ?FINDINGS: ?Coronary  arteries: Normal origins. ?  ?Coronary Calcium Score: ?  ?Left main: 0 ?  ?Left anterior descending artery: 18.8 ?  ?Left circumflex artery: 0 ?  ?Right coronary artery: 0 ?  ?Total: 18.8 ?  ?Percentile: 52nd ?  ?Pericardium: Normal. ?  ?Ascending Aorta: Normal caliber. ?  ?Non-cardiac: See separate report from [facility]. ?  ?IMPRESSION: ?Coronary calcium score of 18.8. This was 52nd percentile for age-, ?race-, and sex-matched controls. ? ?TTE 08/2021: ?IMPRESSIONS  ? ? ? 1. Left ventricular ejection fraction, by estimation, is 70 to 75%. The  ?left ventricle has hyperdynamic function. The left ventricle has no  ?regional wall motion abnormalities. Left ventricular diastolic parameters  ?are indeterminate.  ? 2. Right ventricular systolic function is mildly reduced. The right  ?ventricular size is mildly enlarged. There is normal pulmonary artery  ?systolic pressure.  ? 3. Left atrial size was mildly dilated.  ? 4. The mitral valve is normal in structure. Trivial mitral valve  ?regurgitation. No evidence of mitral stenosis.  ? 5. The aortic valve is tricuspid. Aortic valve regurgitation is not  ?visualized. No aortic stenosis is present.  ? 6. Aortic dilatation noted. There is borderline dilatation of the  ?ascending aorta, measuring 36 mm.  ? 7. The inferior vena cava is normal in size with greater than 50%  ?respiratory variability, suggesting right atrial pressure of 3 mmHg.   ? ?07/26/18: ?Study Conclusions  ?- Left ventricle: The cavity size was normal. Wall thickness was  ?  normal. Systolic function was normal. The estimated ejection  ?  fraction was in the range of 55% to 60

## 2022-01-15 LAB — HEPATIC FUNCTION PANEL
ALT: 13 IU/L (ref 0–32)
AST: 14 IU/L (ref 0–40)
Albumin: 4.5 g/dL (ref 3.7–4.7)
Alkaline Phosphatase: 93 IU/L (ref 44–121)
Bilirubin Total: 0.3 mg/dL (ref 0.0–1.2)
Bilirubin, Direct: 0.12 mg/dL (ref 0.00–0.40)
Total Protein: 7.3 g/dL (ref 6.0–8.5)

## 2022-01-15 LAB — LIPID PANEL
Chol/HDL Ratio: 1.6 ratio (ref 0.0–4.4)
Cholesterol, Total: 160 mg/dL (ref 100–199)
HDL: 97 mg/dL (ref >39)
LDL Chol Calc (NIH): 54 mg/dL (ref 0–99)
Triglycerides: 38 mg/dL (ref 0–149)
VLDL Cholesterol Cal: 9 mg/dL (ref 5–40)

## 2022-01-18 ENCOUNTER — Ambulatory Visit (INDEPENDENT_AMBULATORY_CARE_PROVIDER_SITE_OTHER): Payer: Medicare PPO | Admitting: Cardiology

## 2022-01-18 ENCOUNTER — Encounter: Payer: Self-pay | Admitting: Cardiology

## 2022-01-18 ENCOUNTER — Ambulatory Visit: Payer: Medicare PPO | Admitting: Cardiology

## 2022-01-18 VITALS — BP 128/76 | HR 100 | Ht 67.0 in | Wt 227.4 lb

## 2022-01-18 DIAGNOSIS — I872 Venous insufficiency (chronic) (peripheral): Secondary | ICD-10-CM | POA: Diagnosis not present

## 2022-01-18 DIAGNOSIS — G4733 Obstructive sleep apnea (adult) (pediatric): Secondary | ICD-10-CM

## 2022-01-18 DIAGNOSIS — I890 Lymphedema, not elsewhere classified: Secondary | ICD-10-CM

## 2022-01-18 DIAGNOSIS — I5189 Other ill-defined heart diseases: Secondary | ICD-10-CM

## 2022-01-18 DIAGNOSIS — E7800 Pure hypercholesterolemia, unspecified: Secondary | ICD-10-CM

## 2022-01-18 DIAGNOSIS — E119 Type 2 diabetes mellitus without complications: Secondary | ICD-10-CM

## 2022-01-18 NOTE — Progress Notes (Signed)
?Cardiology Office Note:   ? ?Date:  01/18/2022  ? ?ID:  Jessica Yates, DOB 1950-12-09, MRN [id number] ? ?PCP:  Lucianne Lei, MD  ?[facility] Cardiologist:  Freada Bergeron, MD  ?[facility] Electrophysiologist:  None  ? ?Referring MD: No ref. provider found  ? ? ?History of Present Illness:   ? ?Jessica Yates is a 71 y.o. female with a hx of asthma, diastolic dysfunction, chronic lymphedema, DMII, CKD stage 3, and DVT who presents to clinic for follow-up. ?  ?Patient has known history of chronic lymphedema (started at age 10) and has recurrent cellulitis managed by vascular. Has difficulty ambulating due to LE swelling and arthritis. Walks with cane, but no exertional symptoms. No known history of CAD. Was told she had "congestive heart failure," but is unsure why she was diagnosed with this. States she has never been hospitalized for fluid in her lungs, worsening SOB, or chest pain. Denies chest pain, SOB on exertion, palpitations, nausea or vomiting, abdominal pain. No orthopnea or PND. For her lymphedema, she does LE wrapping, compression stockings, LE elevation, and lasix. TTE in 2019 with normal BiV function, G1DD, no valvular abnormalities.  ?  ?Last seen in clinic by Melina Copa on 07/24/21 where she was feeling well. Chronic lymphedema persisted. TTE with normal EF, mild RV dysfunction, borderline aorta 24mm. Ca score 18 (54%). ? ?Was last seen in clinic on 10/2021 where she was having LE edema in the setting of not taking her lasix as it made her urinate a lot. We increased her lasix to $Remove'40mg'HZIUDcX$  BID x3 days and then daily thereafter.  ? ?Today, the patient states that her LE edema is "sometimes better and sometimes worse". Currently she takes lasix daily; sometimes she will miss a dose and take lasix every other day.  ? ?She asks for clarification regarding some findings in her recent testing, which we reviewed in detail. ? ?She has never been a smoker. She endorses asthma. ? ?She denies any  palpitations, chest pain, or peripheral edema. No lightheadedness, headaches, syncope, orthopnea, or PND. ? ?Also she is concerned about her recent sleep study at home, which she believes may not have been completed correctly.  ? ?Past Medical History:  ?Diagnosis Date  ? Allergic rhinitis   ? Anemia 2002  ? Anxiety   ? Arthritis   ? Asthma   ? Blood clot in vein   ? Cellulitis   ? Cellulitis of right lower extremity   ? Chronic diastolic (congestive) heart failure (HCC)   ? Chronic kidney disease, stage 3 (HCC)   ? Coagulation defect ([place])   ? Cough   ? Depression 02/06/2008  ? Diabetes mellitus, type 2 ([place])   ? DVT (deep venous thrombosis) ([place]) 2002  ? left leg  ? Dysphagia   ? Elevated hemoglobin A1c   ? GERD (gastroesophageal reflux disease)   ? Glaucoma   ? Holosystolic murmur   ? Kidney cysts   ? Leukocytosis   ? Liver cyst   ? Lymphedema   ? Morbid obesity ([place])   ? Overactive bladder   ? Palpitations   ? Peripheral venous insufficiency   ? Primary open angle glaucoma (POAG) of both eyes, mild stage   ? Seasonal allergies   ? Sickle cell trait ([place])   ? Tortuous aorta (HCC)   ? Unstable gait   ? Upper airway cough syndrome   ? Vitamin D deficiency   ? ? ?Past Surgical History:  ?Procedure Laterality  Date  ? [place]  ? nasal reconstruction  ? TOTAL ABDOMINAL HYSTERECTOMY  07/2001  ? ? ?Current Medications: ?Current Meds  ?Medication Sig  ? acetaminophen (TYLENOL) 500 MG tablet Take 1,000 mg by mouth every 6 (six) hours as needed for mild pain.   ? albuterol (VENTOLIN HFA) 108 (90 Base) MCG/ACT inhaler Inhale 2 puffs into the lungs every 6 (six) hours as needed for wheezing or shortness of breath.  ? aspirin EC 81 MG tablet Take 1 tablet (81 mg total) by mouth daily. Swallow whole.  ? blood glucose meter kit and supplies KIT Dispense based on patient and insurance preference. Use up to four times daily as directed. (FOR ICD-9 250.00, 250.01).  ? cetirizine (ZYRTEC) 10 MG tablet Take 10 mg by  mouth daily.  ? famotidine (PEPCID) 40 MG tablet Take 1 tablet (40 mg total) by mouth at bedtime.  ? fluticasone (FLONASE) 50 MCG/ACT nasal spray Place 1-2 sprays into both nostrils daily as needed for allergies or rhinitis.   ? furosemide (LASIX) 40 MG tablet Take 1 tablet by mouth twice a day X's 3 days then go down to 1 tablet by mouth daily  ? glipiZIDE (GLUCOTROL XL) 2.5 MG 24 hr tablet Take 2.5 mg by mouth every morning.  ? potassium chloride SA (K-DUR,KLOR-CON) 20 MEQ tablet Take 20 mEq by mouth daily.   ? RABEprazole (ACIPHEX) 20 MG tablet TAKE 1 TABLET (20 MG TOTAL) BY MOUTH 2 (TWO) TIMES DAILY. TAKE ONE TABLET BEFORE BREAKFAST, AND ONE TABLET BEFORE DINNER  ? rosuvastatin (CRESTOR) 40 MG tablet Take 1 tablet (40 mg total) by mouth daily.  ? timolol (TIMOPTIC) 0.5 % ophthalmic solution 1 drop 2 (two) times daily.  ? TRAVATAN Z 0.004 % SOLN ophthalmic solution Place 1 drop into both eyes at bedtime.   ?  ? ?Allergies:   Patient has no known allergies.  ? ?Social History  ? ?Socioeconomic History  ? Marital status: Single  ?  Spouse name: Not on file  ? Number of children: 0  ? Years of education: Not on file  ? Highest education level: Not on file  ?Occupational History  ? Occupation: retired from [employer]  ?Tobacco Use  ? Smoking status: Never  ? Smokeless tobacco: Never  ?Vaping Use  ? Vaping Use: Never used  ?Substance and Sexual Activity  ? Alcohol use: No  ? Drug use: No  ? Sexual activity: Never  ?Other Topics Concern  ? Not on file  ?Social History Narrative  ? Lives with mother and brother  ?   ?   ? ?Social Determinants of Health  ? ?Financial Resource Strain: Not on file  ?Food Insecurity: Not on file  ?Transportation Needs: Not on file  ?Physical Activity: Not on file  ?Stress: Not on file  ?Social Connections: Not on file  ?  ? ?Family History: ?The patient's family history includes Allergies in her mother and sister; Arthritis in her mother and sister; Asthma in her mother;  Birth defects in her maternal grandmother; Breast cancer in her maternal grandmother and mother; Cancer in her mother; Cancer (age of onset: 70) in her father; Diabetes in her mother; Heart disease in her maternal grandmother; Hyperlipidemia in her mother; Hypertension in her mother; Other in her mother. ? ?ROS:   ?Please see the history of present illness.    ?Review of Systems  ?Constitutional:  Negative for chills and fever.  ?HENT:  Negative for congestion.   ?  Eyes:  Negative for blurred vision.  ?Respiratory:  Negative for shortness of breath.   ?Cardiovascular:  Positive for leg swelling. Negative for chest pain, palpitations, orthopnea, claudication and PND.  ?Gastrointestinal:  Negative for nausea and vomiting.  ?Genitourinary:  Negative for hematuria.  ?Musculoskeletal:  Negative for falls.  ?Neurological:  Negative for dizziness and loss of consciousness.  ?Endo/Heme/Allergies:  Negative for polydipsia.  ?Psychiatric/Behavioral:  Negative for substance abuse.   ? ?EKGs/Labs/Other Studies Reviewed:   ? ?The following studies were reviewed today: ? ?NM Pulmonary Perfusion 01/01/2022: ?FINDINGS: ?There is homogeneous perfusion throughout the lungs. No focal ?perfusion defect noted. ?  ?IMPRESSION: ?Normal study. ? ?Left LE Venous Doppler 09/26/2021: ?Summary:  ?RIGHT:  ?- No evidence of common femoral vein obstruction.  ?   ?LEFT:  ?- There is no evidence of deep vein thrombosis in the lower extremity.  ?However, portions of this examination were limited- see technologist  ?comments above.  ?   ?- No cystic structure found in the popliteal fossa. ? ?Ca score 08/2021: ?FINDINGS: ?Coronary arteries: Normal origins. ?  ?Coronary Calcium Score: ?  ?Left main: 0 ?  ?Left anterior descending artery: 18.8 ?  ?Left circumflex artery: 0 ?  ?Right coronary artery: 0 ?  ?Total: 18.8 ?  ?Percentile: 52nd ?  ?Pericardium: Normal. ?  ?Ascending Aorta: Normal caliber. ?  ?Non-cardiac: See separate report from [facility]  Radiology. ?  ?IMPRESSION: ?Coronary calcium score of 18.8. This was 52nd percentile for age-, ?race-, and sex-matched controls. ? ?TTE 08/2021: ?IMPRESSIONS  ? ? 1. Left ventricular ejection fraction, by estima

## 2022-01-18 NOTE — Patient Instructions (Signed)
Medication Instructions:  ?Your physician recommends that you continue on your current medications as directed. Please refer to the Current Medication list given to you today. ? ?*If you need a refill on your cardiac medications before your next appointment, please call your pharmacy* ? ? ?Lab Work: ?none ?If you have labs (blood work) drawn today and your tests are completely normal, you will receive your results only by: ?MyChart Message (if you have MyChart) OR ?A paper copy in the mail ?If you have any lab test that is abnormal or we need to change your treatment, we will call you to review the results. ? ? ?Testing/Procedures: ?none ? ? ?Follow-Up: ?At [facility], you and your health needs are our priority.  As part of our continuing mission to provide you with exceptional heart care, we have created designated Provider Care Teams.  These Care Teams include your primary Cardiologist (physician) and Advanced Practice Providers (APPs -  Physician Assistants and Nurse Practitioners) who all work together to provide you with the care you need, when you need it. ? ?We recommend signing up for the patient portal called "MyChart".  Sign up information is provided on this After Visit Summary.  MyChart is used to connect with patients for Virtual Visits (Telemedicine).  Patients are able to view lab/test results, encounter notes, upcoming appointments, etc.  Non-urgent messages can be sent to your provider as well.   ?To learn more about what you can do with MyChart, go to [email or web address].   ? ?Your next appointment:   ?6 month(s) ? ?The format for your next appointment:   ?In Person ? ?Provider:   ?Robbie Lis, PA-C, Melina Copa, PA-C, Ermalinda Barrios, PA-C, Christen Bame, NP, or Richardson Dopp, PA-C     Then, Freada Bergeron, MD will plan to see you again in 1 year(s).  ? ? ?Other Instructions ? ? ?Important Information About Sugar ? ? ? ? ?   ?

## 2022-01-25 ENCOUNTER — Other Ambulatory Visit: Payer: Self-pay | Admitting: Physician Assistant

## 2022-01-28 ENCOUNTER — Ambulatory Visit (HOSPITAL_BASED_OUTPATIENT_CLINIC_OR_DEPARTMENT_OTHER): Payer: Medicare PPO | Attending: Cardiology | Admitting: Cardiology

## 2022-01-28 DIAGNOSIS — G4733 Obstructive sleep apnea (adult) (pediatric): Secondary | ICD-10-CM | POA: Diagnosis present

## 2022-01-28 DIAGNOSIS — R0683 Snoring: Secondary | ICD-10-CM | POA: Insufficient documentation

## 2022-01-28 DIAGNOSIS — R0681 Apnea, not elsewhere classified: Secondary | ICD-10-CM | POA: Diagnosis not present

## 2022-02-05 NOTE — Procedures (Signed)
   Patient Name: Jessica Yates, Jessica Yates Study Date:01/28/2022 Gender: Female D.O.B: 12/06/1950 Age (years): 36 Referring Provider: Fransico Him MD, ABSM Height (inches): 4 Interpreting Physician: Fransico Him MD, ABSM Weight (lbs): 223 RPSGT: Baxter Flattery BMI: 35 MRN: [id number] Neck Size: 15.00  CLINICAL INFORMATION Sleep Study Type: NPSG  Indication for sleep study: Diabetes, Snoring, Witnesses Apnea / Gasping During Sleep  Epworth Sleepiness Score: 6  SLEEP STUDY TECHNIQUE As per the AASM Manual for the Scoring of Sleep and Associated Events v2.3 (April 2016) with a hypopnea requiring 4% desaturations.  The channels recorded and monitored were frontal, central and occipital EEG, electrooculogram (EOG), submentalis EMG (chin), nasal and oral airflow, thoracic and abdominal wall motion, anterior tibialis EMG, snore microphone, electrocardiogram, and pulse oximetry.  MEDICATIONS Medications self-administered by patient taken the night of the study : N/A  SLEEP ARCHITECTURE The study was initiated at 11:47:41 PM and ended at 5:47:25 AM.  Sleep onset time was 0.3 minutes and the sleep efficiency was 75.3%. The total sleep time was 270.9 minutes.  Stage REM latency was 126.5 minutes.  The patient spent 3.5% of the night in stage N1 sleep, 76.4% in stage N2 sleep, 12.4% in stage N3 and 7.8% in REM.  Alpha intrusion was absent.  Supine sleep was 42.42%.  RESPIRATORY PARAMETERS The overall apnea/hypopnea index (AHI) was 1.6 per hour. There were 0 total apneas, including 0 obstructive, 0 central and 0 mixed apneas. There were 7 hypopneas and 0 RERAs.  The AHI during Stage REM sleep was 8.6 per hour.  AHI while supine was 0.0 per hour.  The mean oxygen saturation was 93.3%. The minimum SpO2 during sleep was 87.0%.  soft snoring was noted during this study.  CARDIAC DATA The 2 lead EKG demonstrated sinus rhythm. The mean heart rate was 69.0 beats per minute. Other EKG  findings include: None.  LEG MOVEMENT DATA The total PLMS were 0 with a resulting PLMS index of 0.0. Associated arousal with leg movement index was 0.0 .  IMPRESSIONS - No significant obstructive sleep apnea occurred during this study (AHI = 1.6/h). - Mild oxygen desaturation was noted during this study (Min O2 = 87.0%). - The patient snored with soft snoring volume. - No cardiac abnormalities were noted during this study. - Clinically significant periodic limb movements did not occur during sleep. No significant associated arousals.  DIAGNOSIS - Normal Study  RECOMMENDATIONS - Avoid alcohol, sedatives and other CNS depressants that may worsen sleep apnea and disrupt normal sleep architecture. - Sleep hygiene should be reviewed to assess factors that may improve sleep quality. - Weight management and regular exercise should be initiated or continued if appropriate.  [Electronically signed] 02/05/2022 06:56 AM  Fransico Him MD, ABSM Diplomate, American Board of Sleep Medicine

## 2022-02-09 ENCOUNTER — Ambulatory Visit (INDEPENDENT_AMBULATORY_CARE_PROVIDER_SITE_OTHER): Payer: Medicare PPO | Admitting: Internal Medicine

## 2022-02-09 ENCOUNTER — Encounter: Payer: Self-pay | Admitting: Internal Medicine

## 2022-02-09 VITALS — BP 122/70 | HR 91 | Temp 98.4°F | Ht 67.0 in | Wt 224.4 lb

## 2022-02-09 DIAGNOSIS — R053 Chronic cough: Secondary | ICD-10-CM | POA: Diagnosis not present

## 2022-02-09 DIAGNOSIS — J329 Chronic sinusitis, unspecified: Secondary | ICD-10-CM | POA: Diagnosis not present

## 2022-02-09 DIAGNOSIS — R9389 Abnormal findings on diagnostic imaging of other specified body structures: Secondary | ICD-10-CM | POA: Diagnosis not present

## 2022-02-09 DIAGNOSIS — Z889 Allergy status to unspecified drugs, medicaments and biological substances status: Secondary | ICD-10-CM | POA: Diagnosis not present

## 2022-02-09 NOTE — Patient Instructions (Addendum)
ICD-10-CM   1. Chronic cough  R05.3     2. History of seasonal allergies  Z88.9     3. Abnormal CT of the chest  R93.89        You have chronic cough - called  cyclical cough/LPR cough or cough neuropathy Need to sort out factors making itt worse or keeping it alive CT chest dec 2022 was abnormal Right lung base   Plan - check cbc with diff, RAST allergy panel and IgE - check HRCT supine and prone - do CT sinus without contrast - check full PFT  - check FeNO test   #Followup - Ireturn to see Tammy Parrett in  4-6 weeks to discuss workup and next steps - any problems call or come sooner

## 2022-02-09 NOTE — Progress Notes (Signed)
04/16/11- 71 year old female never smoker previously followed in this office for asthma and cough, allergic rhinitis, complicated by a history of peripheral venous insufficiency, deep vein thrombosis in the left leg in 2002, sickle cell trait and glaucoma. There was no history of pulmonary embolism. On 03/27/2011 she went to the emergency room because of neck and back pain. Workup included chest x-ray. This was significant for a mildly prominent rounded soft tissue density in the right superior hilar region at the level of the azygos vein. Radiologist differential was between vascular shadow or mass. CT scan was recommended for evaluation. Additionally were seen mild changes of COPD, borderline cardiomegaly, mild pulmonary vascular congestion. She has been noting some persistent dry cough without wheeze or chest pain. She has not noted fever adenopathy or palpitation. She has a history of chronic lymphedema of the legs and a similar family history in her mother. In 2002 she was treated for deep vein thrombosis in the left lower leg, without pulmonary embolism. She remembers taking heparin and then Coumadin.   05/18/11- 71 year old female never smoker previously followed in this office for asthma and cough, allergic rhinitis, complicated by a history of peripheral venous insufficiency, deep vein thrombosis in the left leg in 2002, sickle cell trait and glaucoma. There was no history of pulmonary emboli. Here with sister. CT-04/16/11-NAD, No right hilar mass. Lower esophageal thickening> potential for reflux. Never much awareness of reflux. Sister describes dry cough - discussed. Benzonate may have helped a little.  Minor sneeze. Denies wheeze or heart burn.  05/07/2016 1st [facility] office visit/ [facility]       Chief Complaint  Patient presents with   pulmonary consult      last seen CY in 2012. pt c/o dry cough at times prod with clear mucus & slight chest discomfort with coughing spells X110mo  typically coughs at hs but no that much noct and during the day sporadic x winter of 2017 typically dry, worse with voice use and not clearly correlating with rhinitis symptoms which are actually better than prev years when she didn't have the cough at all.   No obvious other patterns in day to day or daytime variabilty or assoc excess/ purulent sputum or mucus plugs  or cp or chest tightness, subjective wheeze overt sinus or hb symptoms. No unusual exp hx or h/o childhood pna/ asthma or knowledge of premature birth.   Sleeping ok without nocturnal  or early am exacerbation  of respiratory  c/o's or need for noct saba. Also denies any obvious fluctuation of symptoms with weather or environmental changes or other aggravating or alleviating factors except as outlined above   OV 02/09/2022 -transfer of care from Dr. MLarey Daysto Dr. RChase Caller  Subjective:  Patient ID: [name] female , DOB: 107-12-1950, age 71y.o. , MRN: [id number], [address] [place] [id number] BLucianne Lei MD Patient Care Team: BLucianne Lei MD as PCP - General (Family Medicine) PFreada Bergeron MD as PCP - Cardiology (Cardiology) P[place], RRalene Bathe MD as Referring Physician (Internal Medicine)  This Provider for this visit: Treatment Team:  Attending Provider: RBrand Males MD    02/09/2022 -   Chief Complaint  Patient presents with   Follow-up    Pt states she has been doing okay since last visit. States she has been coughing since last visit which she says happens throughout the day and also when she wakes up.     HPI  Jessica Yates 71 y.o. -as a transfer of care from Dr. Larey Days.  Patient is a retired Pharmacist, hospital.  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  She is tells me that 10 years ago she used to see Dr. Baird Lyons for seasonal allergies and asthma but she is feels that this is under remission.  However she  has always had some sort of a cough.  She also feels she cannot take a good breath that she cannot get the breath out.  Is been going on for a few years particularly the cough.  The cough is a laryngeal quality to it.  She rates it as 6-7 out of 10 on a 10 point scale.  It is worse in the daytime less during the night but it does not wake her up.  She does clear her throat a lot.  Early morning also she has some amount of cough.  She feels her nose is chronically congested.  Review of the records indicate she has not had high-resolution CT chest.  She is also not had a CT sinus.  She is on Flonase.  She is not on ACE inhibitor.  RSI cough score is below.  She is willing to get retested for allergies as long as it is not a skin test.   Recently she had sleep study.  I reviewed the records and Dr. Radford Pax is indicated it is normal.  She does not know the result as yet.  I did pass on to her that it was a normal test result but for further interpretation she has to call Dr. Golden Hurter.  Dr Lorenza Cambridge Reflux Symptom Index (> 13-15 suggestive of LPR cough) 0 -> 5  =  none ->severe problem 02/09/2022   Hoarseness of problem with voice 0  Clearing  Of Throat 2  Excess throat mucus or feeling of post nasal drip 1  Difficulty swallowing food, liquid or tablets 1  Cough after eating or lying down 2  Breathing difficulties or choking episodes 2  Troublesome or annoying cough 4  Sensation of something sticking in throat or lump in throat 0  Heartburn, chest pain, indigestion, or stomach acid coming up 1  TOTAL 13         has a past medical history of Allergic rhinitis, Anemia (2002), Anxiety, Arthritis, Asthma, Blood clot in vein, Cellulitis, Cellulitis of right lower extremity, Chronic diastolic (congestive) heart failure ([place]), Chronic kidney disease, stage 3 ([place]), Coagulation defect ([place]), Cough, Depression (02/06/2008), Diabetes mellitus, type 2 (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   reports that she has never smoked. She has never used smokeless tobacco.  Past Surgical History:  Procedure Laterality Date   COSMETIC SURGERY  1975   nasal reconstruction   TOTAL ABDOMINAL HYSTERECTOMY  07/2001    No Known Allergies  Immunization History  Administered Date(s) Administered   Influenza Split 07/15/2015   Influenza Whole 10/14/2010   PFIZER(Purple Top)SARS-COV-2 Vaccination 10/04/2019, 10/25/2019    Family History  Problem Relation Age of Onset   Allergies Mother    Asthma Mother    Other Mother  enlarged heart   Arthritis Mother    Breast cancer Mother    Diabetes Mother    Hypertension Mother    Cancer Mother        breast   Hyperlipidemia Mother    Heart disease Maternal Grandmother    Breast cancer Maternal Grandmother    Birth defects Maternal Grandmother        breast   Cancer Father 27       leukemia   Allergies Sister    Arthritis Sister      Current Outpatient Medications:    acetaminophen (TYLENOL) 500 MG tablet, Take 1,000 mg by mouth every 6 (six) hours as needed for mild pain. , Disp: , Rfl:    albuterol (VENTOLIN HFA) 108 (90 Base) MCG/ACT inhaler, Inhale 2 puffs into the lungs every 6 (six) hours as needed for wheezing or shortness of breath., Disp: 8 g, Rfl: 6   aspirin EC 81 MG tablet, Take 1 tablet (81 mg total) by mouth daily. Swallow whole., Disp: 90 tablet, Rfl: 3   blood glucose meter kit and supplies KIT, Dispense based on patient and insurance preference. Use up to four times daily as  directed. (FOR ICD-9 250.00, 250.01)., Disp: 1 each, Rfl: 0   cetirizine (ZYRTEC) 10 MG tablet, Take 10 mg by mouth daily., Disp: , Rfl:    famotidine (PEPCID) 40 MG tablet, TAKE 1 TABLET BY MOUTH EVERYDAY AT BEDTIME, Disp: 90 tablet, Rfl: 3   fluticasone (FLONASE) 50 MCG/ACT nasal spray, Place 1-2 sprays into both nostrils daily as needed for allergies or rhinitis. , Disp: , Rfl:    furosemide (LASIX) 40 MG tablet, Take 1 tablet by mouth twice a day X's 3 days then go down to 1 tablet by mouth daily, Disp: 96 tablet, Rfl: 3   glipiZIDE (GLUCOTROL XL) 2.5 MG 24 hr tablet, Take 2.5 mg by mouth every morning., Disp: , Rfl:    potassium chloride SA (K-DUR,KLOR-CON) 20 MEQ tablet, Take 20 mEq by mouth daily. , Disp: , Rfl:    RABEprazole (ACIPHEX) 20 MG tablet, TAKE 1 TABLET (20 MG TOTAL) BY MOUTH 2 (TWO) TIMES DAILY. TAKE ONE TABLET BEFORE BREAKFAST, AND ONE TABLET BEFORE DINNER, Disp: 90 tablet, Rfl: 1   rosuvastatin (CRESTOR) 40 MG tablet, Take 1 tablet (40 mg total) by mouth daily., Disp: 90 tablet, Rfl: 3   timolol (TIMOPTIC) 0.5 % ophthalmic solution, 1 drop 2 (two) times daily., Disp: , Rfl:    TRAVATAN Z 0.004 % SOLN ophthalmic solution, Place 1 drop into both eyes at bedtime. , Disp: , Rfl:       Objective:   Vitals:   02/09/22 1549  BP: 122/70  Pulse: 91  Temp: 98.4 F (36.9 C)  TempSrc: Oral  SpO2: 97%  Weight: 224 lb 6.4 oz (101.8 kg)  Height: _0  (1.702 m)    Estimated body mass index is 35.15 kg/m as calculated from the following:   Height as of this encounter: _1  (1.702 m).   Weight as of this encounter: 224 lb 6.4 oz (101.8 kg).  _2 @  Filed Weights   02/09/22 1549  Weight: 224 lb 6.4 oz (101.8 kg)     Physical Exam    General: No distress. Looks well. Obese. Has cane.  Neuro: Alert and Oriented x 3. GCS 15. Speech normal Psych: Pleasant Resp:  Barrel Chest - no.  Wheeze - no, Crackles - no, No overt respiratory distress CVS: Normal heart  sounds. Murmurs -  no Ext: Stigmata of Connective Tissue Disease - no HEENT: Normal upper airway. PEERL +. No post nasal drip. LARYNGEAL qualty to cough        Assessment:       ICD-10-CM   1. Chronic cough  R05.3 CBC with Differential/Platelet    Resp Allergy Profile Regn2DC DE [facility]    IgE    CT Chest High Resolution    CT Maxillofacial LTD WO CM    Pulmonary function test    Nitric oxide    IgE    Resp Allergy Profile Regn2DC DE MD [facility]    CBC with Differential/Platelet    2. History of seasonal allergies  Z88.9 CBC with Differential/Platelet    Resp Allergy Profile Regn2DC DE MD [facility]    IgE    CT Chest High Resolution    CT Maxillofacial LTD WO CM    Pulmonary function test    Nitric oxide    IgE    Resp Allergy Profile Regn2DC DE MD [facility]    CBC with Differential/Platelet    3. Abnormal CT of the chest  R93.89 CT Chest High Resolution    4. Chronic sinusitis, unspecified location  J32.9 CT Maxillofacial LTD WO CM         Plan:     Patient Instructions     ICD-10-CM   1. Chronic cough  R05.3     2. History of seasonal allergies  Z88.9     3. Abnormal CT of the chest  R93.89        You have chronic cough - called  cyclical cough/LPR cough or cough neuropathy Need to sort out factors making itt worse or keeping it alive CT chest dec 2022 was abnormal Right lung base   Plan - check cbc with diff, RAST allergy panel and IgE - check HRCT supine and prone - do CT sinus without contrast - check full PFT  - check FeNO test   #Followup - Ireturn to see Tammy Parrett in  4-6 weeks to discuss workup and next steps - any problems call or come sooner   ( Level 05 visit: Estb 40-54 min in  visit type: on-site physical face to visit  in total care time and counseling or/and coordination of care by this undersigned MD - Dr Brand Males. This includes one or more of the following on this same day 02/09/2022: pre-charting, chart review, note writing,  documentation discussion of test results, diagnostic or treatment recommendations, prognosis, risks and benefits of management options, instructions, education, compliance or risk-factor reduction. It excludes time spent by the [place] or office staff in the care of the patient. Actual time 51 min)   SIGNATURE    Dr. Brand Males, M.D., F.C.C.P,  Pulmonary and Critical Care Medicine Staff Physician, [facility] Director - Interstitial Lung Disease  Program  P[facility] [place], [place], [id number]  Pager: [phone], If no answer or between  15:00h - 7:00h: call [phone] Telephone: [phone]  5:49 PM 02/09/2022

## 2022-02-10 ENCOUNTER — Telehealth: Payer: Self-pay | Admitting: Cardiology

## 2022-02-10 LAB — CBC WITH DIFFERENTIAL/PLATELET
Basophils Absolute: 0 10*3/uL (ref 0.0–0.1)
Basophils Relative: 0.5 % (ref 0.0–3.0)
Eosinophils Absolute: 0.1 10*3/uL (ref 0.0–0.7)
Eosinophils Relative: 1.4 % (ref 0.0–5.0)
HCT: 38.6 % (ref 36.0–46.0)
Hemoglobin: 12.6 g/dL (ref 12.0–15.0)
Lymphocytes Relative: 14.9 % (ref 12.0–46.0)
Lymphs Abs: 1.2 10*3/uL (ref 0.7–4.0)
MCHC: 32.5 g/dL (ref 30.0–36.0)
MCV: 83.1 fl (ref 78.0–100.0)
Monocytes Absolute: 0.3 10*3/uL (ref 0.1–1.0)
Monocytes Relative: 4.3 % (ref 3.0–12.0)
Neutro Abs: 6.3 10*3/uL (ref 1.4–7.7)
Neutrophils Relative %: 78.9 % — ABNORMAL HIGH (ref 43.0–77.0)
Platelets: 180 10*3/uL (ref 150.0–400.0)
RBC: 4.65 Mil/uL (ref 3.87–5.11)
RDW: 16 % — ABNORMAL HIGH (ref 11.5–15.5)
WBC: 8 10*3/uL (ref 4.0–10.5)

## 2022-02-10 NOTE — Telephone Encounter (Signed)
Patient is calling to get the results of her sleep study test

## 2022-02-11 ENCOUNTER — Other Ambulatory Visit: Payer: Self-pay | Admitting: *Deleted

## 2022-02-11 ENCOUNTER — Telehealth: Payer: Self-pay | Admitting: *Deleted

## 2022-02-11 DIAGNOSIS — R053 Chronic cough: Secondary | ICD-10-CM

## 2022-02-11 DIAGNOSIS — J329 Chronic sinusitis, unspecified: Secondary | ICD-10-CM

## 2022-02-11 DIAGNOSIS — Z889 Allergy status to unspecified drugs, medicaments and biological substances status: Secondary | ICD-10-CM

## 2022-02-11 LAB — RESPIRATORY ALLERGY PROFILE REGION II ~~LOC~~
Allergen, A. alternata, m6: <0.1 kU/L
Allergen, Cedar tree, t12: 0.31 kU/L — ABNORMAL HIGH
Allergen, Comm Silver Birch, t9: <0.1 kU/L
Allergen, Cottonwood, t14: <0.1 kU/L
Allergen, D pternoyssinus,d7: 0.59 kU/L — ABNORMAL HIGH
Allergen, Mouse Urine Protein, e78: <0.1 kU/L
Allergen, Mulberry, t76: <0.1 kU/L
Allergen, Oak,t7: <0.1 kU/L
Allergen, P. notatum, m1: <0.1 kU/L
Aspergillus fumigatus, m3: <0.1 kU/L
Bermuda Grass: 1.58 kU/L — ABNORMAL HIGH
Box Elder IgE: <0.1 kU/L
CLADOSPORIUM HERBARUM (M2) IGE: <0.1 kU/L
COMMON RAGWEED (SHORT) (W1) IGE: 0.65 kU/L — ABNORMAL HIGH
Cat Dander: <0.1 kU/L
Class: 0
Class: 0
Class: 0
Class: 0
Class: 0
Class: 0
Class: 0
Class: 0
Class: 0
Class: 0
Class: 0
Class: 0
Class: 0
Class: 0
Class: 0
Class: 1
Class: 1
Class: 1
Class: 2
Class: 2
Class: 2
Class: 3
Class: 3
Cockroach: <0.1 kU/L
D. farinae: 0.56 kU/L — ABNORMAL HIGH
Dog Dander: <0.1 kU/L
Elm IgE: 1.76 kU/L — ABNORMAL HIGH
IgE (Immunoglobulin E), Serum: 164 kU/L — ABNORMAL HIGH (ref <=114)
Johnson Grass: 2.12 kU/L — ABNORMAL HIGH
Pecan/Hickory Tree IgE: 5.43 kU/L — ABNORMAL HIGH
Rough Pigweed  IgE: <0.1 kU/L
Sheep Sorrel IgE: <0.1 kU/L
Timothy Grass: 5.14 kU/L — ABNORMAL HIGH

## 2022-02-11 LAB — INTERPRETATION:

## 2022-02-11 NOTE — Telephone Encounter (Signed)
The patient has been notified of the result and verbalized understanding.  All questions (if any) were answered. Marolyn Hammock, [place] 02/11/2022 5:42 PM    Pt is aware and agreeable to normal results.

## 2022-02-11 NOTE — Telephone Encounter (Signed)
-----   Message from Lauralee Evener, [place] sent at 02/05/2022  8:36 AM EDT -----  ----- Message ----- From: Sueanne Margarita, MD Sent: 02/05/2022   6:59 AM EDT To: Cv Div Sleep Studies  Please let patient know that sleep study showed no significant sleep apnea.

## 2022-02-16 NOTE — Progress Notes (Signed)
Blood IgE adnd blood allergy test positive fo grass, house dust mite. PLAN - keep vist with APP Jessica Yates in July 2023 and go thorough other wirkup.

## 2022-02-19 ENCOUNTER — Other Ambulatory Visit: Payer: Self-pay | Admitting: Physician Assistant

## 2022-02-19 NOTE — Telephone Encounter (Signed)
02/11/22  5:47 PM  The patient has been notified of the result and verbalized understanding.  All questions (if any) were answered. Marolyn Hammock, [place] 02/11/2022 5:42 PM     Pt is aware and agreeable to normal results.

## 2022-02-23 ENCOUNTER — Telehealth: Payer: Self-pay | Admitting: Internal Medicine

## 2022-02-26 NOTE — Telephone Encounter (Signed)
Called and spoke with patient. She had several questions about the CT scan that included what a CT scan is, what is t checking for and how long the test would take. I was able to answer all of her questions for her. She verbalized understanding.   Nothing further needed at time of call.

## 2022-03-22 ENCOUNTER — Ambulatory Visit
Admission: RE | Admit: 2022-03-22 | Discharge: 2022-03-22 | Disposition: A | Discharge status: Home / Self Care | Payer: Medicare PPO | Source: Ambulatory Visit | Attending: Internal Medicine | Admitting: Internal Medicine

## 2022-03-22 DIAGNOSIS — R053 Chronic cough: Secondary | ICD-10-CM

## 2022-03-22 DIAGNOSIS — R9389 Abnormal findings on diagnostic imaging of other specified body structures: Secondary | ICD-10-CM

## 2022-03-22 DIAGNOSIS — I700 Atherosclerosis of aorta: Secondary | ICD-10-CM | POA: Diagnosis not present

## 2022-03-22 DIAGNOSIS — J329 Chronic sinusitis, unspecified: Secondary | ICD-10-CM

## 2022-03-22 DIAGNOSIS — Z889 Allergy status to unspecified drugs, medicaments and biological substances status: Secondary | ICD-10-CM

## 2022-03-22 DIAGNOSIS — R590 Localized enlarged lymph nodes: Secondary | ICD-10-CM | POA: Diagnosis not present

## 2022-03-22 DIAGNOSIS — J984 Other disorders of lung: Secondary | ICD-10-CM | POA: Diagnosis not present

## 2022-03-22 DIAGNOSIS — J9811 Atelectasis: Secondary | ICD-10-CM | POA: Diagnosis not present

## 2022-03-25 DIAGNOSIS — E1169 Type 2 diabetes mellitus with other specified complication: Secondary | ICD-10-CM | POA: Diagnosis not present

## 2022-03-29 ENCOUNTER — Encounter: Payer: Self-pay | Admitting: Podiatry

## 2022-03-29 ENCOUNTER — Ambulatory Visit (INDEPENDENT_AMBULATORY_CARE_PROVIDER_SITE_OTHER): Payer: Medicare PPO | Admitting: Podiatry

## 2022-03-29 ENCOUNTER — Ambulatory Visit: Payer: Medicare PPO | Admitting: Podiatry

## 2022-03-29 DIAGNOSIS — B353 Tinea pedis: Secondary | ICD-10-CM | POA: Diagnosis not present

## 2022-03-29 DIAGNOSIS — Z794 Long term (current) use of insulin: Secondary | ICD-10-CM

## 2022-03-29 DIAGNOSIS — M79674 Pain in right toe(s): Secondary | ICD-10-CM

## 2022-03-29 DIAGNOSIS — E0822 Diabetes mellitus due to underlying condition with diabetic chronic kidney disease: Secondary | ICD-10-CM

## 2022-03-29 DIAGNOSIS — B351 Tinea unguium: Secondary | ICD-10-CM

## 2022-03-29 DIAGNOSIS — N183 Chronic kidney disease, stage 3 unspecified: Secondary | ICD-10-CM

## 2022-03-29 DIAGNOSIS — M79675 Pain in left toe(s): Secondary | ICD-10-CM | POA: Diagnosis not present

## 2022-03-29 MED ORDER — KETOCONAZOLE 2 % EX CREA: TOPICAL_CREAM | CUTANEOUS | 1 refills | Status: DC

## 2022-03-29 NOTE — Patient Instructions (Addendum)
To prevent reinfection, spray shoes with lysol every evening.  Athlete's Foot Athlete's foot (tinea pedis) is a fungal infection of the skin on your feet. It often occurs on the skin that is between or underneath the toes. It can also occur on the soles of your feet. The infection can spread from person to person (is contagious). It can also spread when a person's bare feet come in contact with the fungus on shower floors or on items such as shoes. What are the causes? This condition is caused by a fungus that grows in warm, moist places. You can get athlete's foot by sharing shoes, shower stalls, towels, and wet floors with someone who is infected. Not washing your feet or changing your socks often enough can also lead to athlete's foot. What increases the risk? This condition is more likely to develop in: Men. People who have a weak body defense system (immune system). People who have diabetes. People who use public showers, such as at a gym. People who wear heavy-duty shoes, such as Environmental manager. Seasons with warm, humid weather. What are the signs or symptoms? Symptoms of this condition include: Itchy areas between your toes or on the soles of your feet. White, flaky, or scaly areas between your toes or on the soles of your feet. Very itchy small blisters between your toes or on the soles of your feet. Small cuts in your skin. These cuts can become infected. Thick or discolored toenails. How is this diagnosed? This condition may be diagnosed with a physical exam and a review of your medical history. Your health care provider may also take a skin or toenail sample to examine under a microscope. How is this treated? This condition is treated with antifungal medicines. These may be applied as powders, ointments, or creams. In severe cases, an oral antifungal medicine may be given. Follow these instructions at home: Medicines Apply or take over-the-counter and prescription  medicines only as told by your health care provider. Apply your antifungal medicine as told by your health care provider. Do not stop using the antifungal even if your condition improves. Foot care Do not scratch your feet. Keep your feet dry: Wear cotton or wool socks. Change your socks every day or if they become wet. Wear shoes that allow air to flow, such as sandals or canvas tennis shoes. Wash and dry your feet, including the area between your toes. Also, wash and dry your feet: Every day or as told by your health care provider. After exercising. General instructions Do not let others use towels, shoes, nail clippers, or other personal items that touch your feet. Protect your feet by wearing sandals in wet areas, such as locker rooms and shared showers. Keep all follow-up visits. This is important. If you have diabetes, keep your blood sugar under control. Contact a health care provider if: You have a fever. You have swelling, soreness, warmth, or redness in your foot. Your feet are not getting better with treatment. Your symptoms get worse. You have new symptoms. You have severe pain. Summary Athlete's foot (tinea pedis) is a fungal infection of the skin on your feet. It often occurs on skin that is between or underneath the toes. This condition is caused by a fungus that grows in warm, moist places. Symptoms include white, flaky, or scaly areas between your toes or on the soles of your feet. This condition is treated with antifungal medicines. Keep your feet clean. Always dry them thoroughly. This information  is not intended to replace advice given to you by your health care provider. Make sure you discuss any questions you have with your health care provider. Document Revised: 12/21/2020 Document Reviewed: 12/21/2020 Elsevier Patient Education  [place].

## 2022-03-30 ENCOUNTER — Other Ambulatory Visit: Payer: Self-pay

## 2022-03-30 MED ORDER — POTASSIUM CHLORIDE CRYS ER 20 MEQ PO TBCR: 20.0000 meq | EXTENDED_RELEASE_TABLET | Freq: Every day | ORAL | 3 refills | Status: DC

## 2022-03-30 NOTE — Telephone Encounter (Signed)
Pt's medication was sent to pt's pharmacy as requested. Confirmation received.  °

## 2022-03-30 NOTE — Telephone Encounter (Signed)
Pt calling wanting Dr. Johney Frame to refill her potassium. Would Dr. Johney Frame like to refill this medication? Please address

## 2022-04-04 ENCOUNTER — Encounter: Payer: Self-pay | Admitting: Podiatry

## 2022-04-04 NOTE — Progress Notes (Signed)
  Subjective:  Patient ID: Jessica Yates, female    DOB: April 23, 1951,  MRN: [id number]  Jessica Yates presents to clinic today for preventative diabetic foot care and painful elongated mycotic toenails 1-5 bilaterally which are tender when wearing enclosed shoe gear. Pain is relieved with periodic professional debridement.  Patient states blood glucose was 126 mg/dl today.    Last A1c was 6.3%.  Last known HgA1c was unknown.    New problem(s): None.   PCP is Lucianne Lei, MD , and last visit was  May, 2023.  No Known Allergies  Review of Systems: Negative except as noted in the HPI.  Objective: General: Patient is a pleasant 71 y.o. African American female obese in NAD. AAO x 3.   Neurovascular Examination: Capillary fill time to digits <3 seconds b/l lower extremities. Faintly palpable DP pulse(s) b/l lower extremities. Nonpalpable PT pulse(s) b/l lower extremities. Pedal hair absent. Lower extremity skin temperature gradient within normal limits. No pain with calf compression b/l. Lymphedema present b/l lower extremities.  Protective sensation intact 5/5 intact bilaterally with 10g monofilament b/l.  Dermatological:  No open wounds b/l lower extremities. No interdigital macerations b/l lower extremities. Toenails 1-5 b/l elongated, discolored, dystrophic, thickened, crumbly with subungual debris and tenderness to dorsal palpation.Hyperpigmentation consistent with findings of chronic venous insufficiency is present b/l lower extremities. Skin b/l lower extremities noted to be thickened and brawny consistent with lymphedema.   Diffuse scaling noted peripherally and plantarly b/l feet with mild foot odor.  No interdigital macerations.  No blisters, no weeping. No signs of secondary bacterial infection noted.   Musculoskeletal:  Normal muscle strength 5/5 to all lower extremity muscle groups bilaterally. Pes planus deformity noted b/l lower extremities.  Assessment/Plan: 1.  Pain due to onychomycosis of toenails of both feet   2. Tinea pedis of both feet   3. Diabetes mellitus due to underlying condition with stage 3 chronic kidney disease, with long-term current use of insulin, unspecified whether stage 3a or 3b CKD ([place])     -Patient was evaluated and treated. All patient's and/or POA's questions/concerns answered on today's visit. -Toenails 1-5 b/l were debrided in length and girth with sterile nail nippers and dremel without iatrogenic bleeding.  -To prevent re-infection of tinea pedis, patient/POA/caregiver instructed to spray shoes with Lysol every evening. -For tinea pedis, Rx sent to pharmacy for Ketoconazole Cream 2% to be applied once daily for six weeks. -Patient/POA to call should there be question/concern in the interim.   Return in about 3 months (around 06/29/2022).  Marzetta Board, DPM

## 2022-04-05 ENCOUNTER — Ambulatory Visit: Payer: Medicare PPO | Admitting: Adult Health

## 2022-04-19 DIAGNOSIS — N1831 Chronic kidney disease, stage 3a: Secondary | ICD-10-CM | POA: Diagnosis not present

## 2022-04-26 ENCOUNTER — Other Ambulatory Visit: Payer: Self-pay

## 2022-04-26 MED ORDER — FUROSEMIDE 40 MG PO TABS: ORAL_TABLET | ORAL | 2 refills | Status: DC

## 2022-05-04 DIAGNOSIS — E1122 Type 2 diabetes mellitus with diabetic chronic kidney disease: Secondary | ICD-10-CM | POA: Diagnosis not present

## 2022-05-04 DIAGNOSIS — N1831 Chronic kidney disease, stage 3a: Secondary | ICD-10-CM | POA: Diagnosis not present

## 2022-05-04 DIAGNOSIS — I890 Lymphedema, not elsewhere classified: Secondary | ICD-10-CM | POA: Diagnosis not present

## 2022-05-04 DIAGNOSIS — R3129 Other microscopic hematuria: Secondary | ICD-10-CM | POA: Diagnosis not present

## 2022-05-04 DIAGNOSIS — Q6102 Congenital multiple renal cysts: Secondary | ICD-10-CM | POA: Diagnosis not present

## 2022-05-14 ENCOUNTER — Ambulatory Visit: Payer: Medicare PPO | Admitting: Adult Health

## 2022-05-28 DIAGNOSIS — N1831 Chronic kidney disease, stage 3a: Secondary | ICD-10-CM | POA: Diagnosis not present

## 2022-05-29 DIAGNOSIS — E1165 Type 2 diabetes mellitus with hyperglycemia: Secondary | ICD-10-CM | POA: Diagnosis not present

## 2022-06-28 DIAGNOSIS — Z6834 Body mass index (BMI) 34.0-34.9, adult: Secondary | ICD-10-CM | POA: Diagnosis not present

## 2022-06-28 DIAGNOSIS — E782 Mixed hyperlipidemia: Secondary | ICD-10-CM | POA: Diagnosis not present

## 2022-06-28 DIAGNOSIS — I890 Lymphedema, not elsewhere classified: Secondary | ICD-10-CM | POA: Diagnosis not present

## 2022-06-28 DIAGNOSIS — I10 Essential (primary) hypertension: Secondary | ICD-10-CM | POA: Diagnosis not present

## 2022-06-28 DIAGNOSIS — E1169 Type 2 diabetes mellitus with other specified complication: Secondary | ICD-10-CM | POA: Diagnosis not present

## 2022-07-02 ENCOUNTER — Ambulatory Visit (INDEPENDENT_AMBULATORY_CARE_PROVIDER_SITE_OTHER): Payer: Medicare PPO | Admitting: Adult Health

## 2022-07-02 ENCOUNTER — Ambulatory Visit (INDEPENDENT_AMBULATORY_CARE_PROVIDER_SITE_OTHER): Payer: Medicare PPO | Admitting: Internal Medicine

## 2022-07-02 ENCOUNTER — Encounter: Payer: Self-pay | Admitting: Adult Health

## 2022-07-02 VITALS — BP 100/60 | HR 75 | Temp 97.7°F | Ht 67.0 in | Wt 224.0 lb

## 2022-07-02 DIAGNOSIS — R053 Chronic cough: Secondary | ICD-10-CM

## 2022-07-02 DIAGNOSIS — J309 Allergic rhinitis, unspecified: Secondary | ICD-10-CM

## 2022-07-02 DIAGNOSIS — Z23 Encounter for immunization: Secondary | ICD-10-CM

## 2022-07-02 DIAGNOSIS — K029 Dental caries, unspecified: Secondary | ICD-10-CM | POA: Diagnosis not present

## 2022-07-02 DIAGNOSIS — K219 Gastro-esophageal reflux disease without esophagitis: Secondary | ICD-10-CM

## 2022-07-02 DIAGNOSIS — Z889 Allergy status to unspecified drugs, medicaments and biological substances status: Secondary | ICD-10-CM

## 2022-07-02 LAB — PULMONARY FUNCTION TEST
DL/VA % pred: 133 %
DL/VA: 5.41 ml/min/mmHg/L
DLCO cor % pred: 120 %
DLCO cor: 25.91 ml/min/mmHg
DLCO unc % pred: 120 %
DLCO unc: 25.91 ml/min/mmHg
RV % pred: 107 %
RV: 2.53 L
TLC % pred: 101 %
TLC: 5.56 L

## 2022-07-02 MED ORDER — CETIRIZINE HCL 10 MG PO TABS: 10.0000 mg | ORAL_TABLET | Freq: Every day | ORAL | 5 refills | Status: DC

## 2022-07-02 MED ORDER — FLUTICASONE PROPIONATE 50 MCG/ACT NA SUSP: 2.0000 | Freq: Every day | NASAL | 5 refills | Status: AC

## 2022-07-02 MED ORDER — BENZONATATE 200 MG PO CAPS: 200.0000 mg | ORAL_CAPSULE | Freq: Three times a day (TID) | ORAL | 1 refills | Status: DC | PRN

## 2022-07-02 NOTE — Assessment & Plan Note (Signed)
Restart Zyrtec and Flonase.

## 2022-07-02 NOTE — Assessment & Plan Note (Addendum)
  Chronic cough with suspected upper airway irritation from postnasal drainage and GERD. High-resolution CT chest get it for IPF or acute process.  CT sinus negative.  Allergy profile did show elevated IgE and allergens.  Recommend restarting Zyrtec and Flonase for trigger prevention. Unable to do PFTs today.  May have a component of reactive airways or mild intermittent asthma/cough variant asthma.  Would use albuterol as needed.  On return visit could consider adding Singulair if cough continues.  Plan Patient Instructions  Restart Zyrtec '10mg'$  daily  Restart Flonase 2 puffs daily  Continue on Aciphex Twice daily   Deslym 2 tsp Twice daily  for cough, as needed  Tessalon Three times a day  for cough As needed   Albuterol inhaler As needed   Establish with dentist .  Follow up with Dr. Chase Caller in 3 months and As needed   Please contact office for sooner follow up if symptoms do not improve or worsen or seek emergency care

## 2022-07-02 NOTE — Patient Instructions (Addendum)
Restart Zyrtec '10mg'$  daily  Restart Flonase 2 puffs daily  Continue on Aciphex Twice daily   Deslym 2 tsp Twice daily  for cough, as needed  Tessalon Three times a day  for cough As needed   Albuterol inhaler As needed   Establish with dentist .  Follow up with Dr. Chase Caller in 3 months and As needed   Please contact office for sooner follow up if symptoms do not improve or worsen or seek emergency care

## 2022-07-02 NOTE — Progress Notes (Signed)
$'@Patient'w$  ID: Jessica Yates, female    DOB: 06/25/1951, 71 y.o.   MRN: [id number]  Chief Complaint  Patient presents with   Follow-up    Referring provider: Lucianne Lei, MD  HPI: 70 year old female followed for chronic cough and chronic rhinitis History of asthma  TEST/EVENTS :   07/02/2022 Follow up: Chronic cough , CR Patient returns for 2-month follow-up.  Patient was seen for chronic cough has been going on for years.  Does have some associated postnasal drainage intermittent throat clearing and nasal congestion.  She is currently not taking any Flonase or Zyrtec but was previously on this.  She is taking Aciphex twice daily for GERD. Last visit she was set up for high-resolution CT chest that showed no evidence of interstitial lung disease.  Showed rounded atelectasis in the right lower lobe and by apical scarring.  CT sinuses were negative. Lab work showed allergy panel positive to dust, elm tree hickory tree [place] grass and IgE at 164.  Absolute eosinophil count was 100 patient says she is not using anything currently for cough..  We discussed her test results in detail.  She did go for PFT today.  She was unable to a perform.  Diffusing capacity did show at 120%.   No Known Allergies Putting the Immunization History  Administered Date(s) Administered   Fluad Quad(high Dose 65+) 07/02/2022   Influenza Split 07/15/2015   Influenza Whole 10/14/2010   PFIZER(Purple Top)SARS-COV-2 Vaccination 10/04/2019, 10/25/2019    Past Medical History:  Diagnosis Date   Allergic rhinitis    Anemia 2002   Anxiety    Arthritis    Asthma    Blood clot in vein    Cellulitis    Cellulitis of right lower extremity    Chronic diastolic (congestive) heart failure (HCC)    Chronic kidney disease, stage 3 (HCC)    Coagulation defect (HCC)    Cough    Depression 02/06/2008   Diabetes mellitus, type 2 (HCC)    DVT (deep venous thrombosis) ([place]) 2002   left leg   Dysphagia     Elevated hemoglobin A1c    GERD (gastroesophageal reflux disease)    Glaucoma    Holosystolic murmur    Kidney cysts    Leukocytosis    Liver cyst    Lymphedema    Morbid obesity ([place])    Overactive bladder    Palpitations    Peripheral venous insufficiency    Primary open angle glaucoma (POAG) of both eyes, mild stage    Seasonal allergies    Sickle cell trait ([place])    Tortuous aorta (HCC)    Unstable gait    Upper airway cough syndrome    Vitamin D deficiency     Tobacco History: Social History   Tobacco Use  Smoking Status Never  Smokeless Tobacco Never   Counseling given: Not Answered   Outpatient Medications Prior to Visit  Medication Sig Dispense Refill   acetaminophen (TYLENOL) 500 MG tablet Take 1,000 mg by mouth every 6 (six) hours as needed for mild pain.      albuterol (VENTOLIN HFA) 108 (90 Base) MCG/ACT inhaler Inhale 2 puffs into the lungs every 6 (six) hours as needed for wheezing or shortness of breath. 8 g 6   aspirin 81 MG chewable tablet Chew 1 tablet every day by oral route.     blood glucose meter kit and supplies KIT Dispense based on patient and insurance preference. Use up to four times daily  as directed. (FOR ICD-9 250.00, 250.01). 1 each 0   Cholecalciferol 25 MCG (1000 UT) capsule TAKE 1 CAPSULE (1,000 UNITS TOTAL) BY MOUTH DAILY.     dapagliflozin propanediol (FARXIGA) 10 MG TABS tablet Take 10 mg by mouth daily.     famotidine (PEPCID) 40 MG tablet TAKE 1 TABLET BY MOUTH EVERYDAY AT BEDTIME 90 tablet 3   furosemide (LASIX) 40 MG tablet Take 1 tablet by mouth twice a day X's 3 days then go down to 1 tablet by mouth daily 90 tablet 2   ketoconazole (NIZORAL) 2 % cream Apply to both feet and between toes once daily for 6 weeks. 60 g 1   losartan (COZAAR) 25 MG tablet Take 12.5 mg by mouth daily.     RABEprazole (ACIPHEX) 20 MG tablet TAKE 1 TABLET (20 MG TOTAL) BY MOUTH 2 (TWO) TIMES DAILY. TAKE ONE TABLET BEFORE BREAKFAST, AND ONE TABLET BEFORE  DINNER 180 tablet 1   rosuvastatin (CRESTOR) 40 MG tablet Take 1 tablet (40 mg total) by mouth daily. 90 tablet 3   timolol (TIMOPTIC) 0.5 % ophthalmic solution 1 drop 2 (two) times daily.     TRAVATAN Z 0.004 % SOLN ophthalmic solution Place 1 drop into both eyes at bedtime.      triamcinolone cream (KENALOG) 0.1 % APPLY A THIN LAYER TO THE AFFECTED AREA(S) 2 TIMES PER DAY AS NEEDED     cetirizine (ZYRTEC) 10 MG tablet Take 10 mg by mouth daily as needed.     ipratropium (ATROVENT) 0.03 % nasal spray 2 PUFFS PER NOSTRIL EACH DAY (Patient not taking: Reported on 07/02/2022)     atorvastatin (LIPITOR) 10 MG tablet  (Patient not taking: Reported on 07/02/2022)     benzonatate (TESSALON) 200 MG capsule TAKE 1 CAPSULE (200 MG) BY MOUTH  3 TIMES PER DAY AS NEEDED FOR COUGH FOR 20 DAYS (Patient not taking: Reported on 07/02/2022)     celecoxib (CELEBREX) 100 MG capsule TAKE ONE CAPSULE BY MOUTH 1 TO 2 TIMES PER DAY (Patient not taking: Reported on 07/02/2022)     Cholecalciferol 125 MCG (5000 UT) capsule Take 1 tablet by mouth daily. (Patient not taking: Reported on 07/02/2022)     clotrimazole (LOTRIMIN) 1 % cream APPLY TO BOTH FEET AND BETWEEN TOES TWICE DAILY FOR 4 WEEKS. (Patient not taking: Reported on 07/02/2022)     diclofenac Sodium (VOLTAREN) 1 % GEL APPLY 4 GRAMS TOPICALLY 4 TIMES A DAY. (Patient not taking: Reported on 07/02/2022)     doxycycline (VIBRAMYCIN) 100 MG capsule TAKE 1 CAPSULE BY MOUTH TWICE A DAY FOR 10 DAYS (Patient not taking: Reported on 07/02/2022)     famotidine (PEPCID) 20 MG tablet TAKE 1 TABLET (20 MG) BY MOUTH 2 TIMES A DAY FOR ACID REFLUX (Patient not taking: Reported on 07/02/2022)     fluticasone (FLONASE) 50 MCG/ACT nasal spray Place 1-2 sprays into both nostrils daily as needed for allergies or rhinitis.  (Patient not taking: Reported on 07/02/2022)     gabapentin (NEURONTIN) 100 MG capsule Take 1 capsule by mouth 2 (two) times daily. (Patient not taking: Reported on  07/02/2022)     glipiZIDE (GLUCOTROL XL) 2.5 MG 24 hr tablet Take 2.5 mg by mouth every morning. (Patient not taking: Reported on 07/02/2022)     metFORMIN (GLUCOPHAGE) 500 MG tablet Take 1 tablet by mouth 2 (two) times daily with a meal. (Patient not taking: Reported on 07/02/2022)     metolazone (ZAROXOLYN) 2.5 MG tablet TAKE 1  TABLET BY MOUTH  DAILY FOR 7 DAYS (Patient not taking: Reported on 07/02/2022)     mirabegron ER (MYRBETRIQ) 25 MG TB24 tablet  (Patient not taking: Reported on 07/02/2022)     omeprazole (PRILOSEC) 40 MG capsule  (Patient not taking: Reported on 07/02/2022)     pantoprazole (PROTONIX) 20 MG tablet  (Patient not taking: Reported on 07/02/2022)     potassium chloride SA (KLOR-CON M) 20 MEQ tablet Take 1 tablet (20 mEq total) by mouth daily. (Patient not taking: Reported on 07/02/2022) 90 tablet 3   traMADol (ULTRAM) 50 MG tablet TAKE 1 TABLET (50 MG) BY MOUTH  EVERY 8 HOURS AS NEEDED FOR PAIN FOR 30  DAYS (Patient not taking: Reported on 07/02/2022)     No facility-administered medications prior to visit.     Review of Systems:   Constitutional:   No  weight loss, night sweats,  Fevers, chills,  +fatigue, or  lassitude.  HEENT:   No headaches,  Difficulty swallowing,  Tooth/dental problems, or  Sore throat,                No sneezing, itching, ear ache,  +nasal congestion, post nasal drip,   CV:  No chest pain,  Orthopnea, PND, swelling in lower extremities, anasarca, dizziness, palpitations, syncope.   GI  No heartburn, indigestion, abdominal pain, nausea, vomiting, diarrhea, change in bowel habits, loss of appetite, bloody stools.   Resp:  No chest wall deformity  Skin: no rash or lesions.  GU: no dysuria, change in color of urine, no urgency or frequency.  No flank pain, no hematuria   MS:  No joint pain or swelling.  No decreased range of motion.  No back pain.    Physical Exam  BP 100/60 (BP Location: Left Arm, Patient Position: Sitting, Cuff Size:  Large)   Pulse 75   Temp 97.7 F (36.5 C) (Oral)   Ht 5\' 7"  (1.702 m)   Wt 224 lb (101.6 kg)   SpO2 97%   BMI 35.08 kg/m   GEN: A/Ox3; pleasant , NAD, well nourished    HEENT:  [place]/AT,  NOSE-clear, THROAT-clear, no lesions, no postnasal drip or exudate noted.  Poor denition.   NECK:  Supple w/ fair ROM; no JVD; normal carotid impulses w/o bruits; no thyromegaly or nodules palpated; no lymphadenopathy.    RESP  Clear  P & A; w/o, wheezes/ rales/ or rhonchi. no accessory muscle use, no dullness to percussion  CARD:  RRR, no m/r/g, no peripheral edema, pulses intact, no cyanosis or clubbing.  GI:   Soft & nt; nml bowel sounds; no organomegaly or masses detected.   Musco: Warm bil, no deformities or joint swelling noted.   Neuro: alert, no focal deficits noted.    Skin: Warm, no lesions or rashes    Lab Results:  CBC    Component Value Date/Time   WBC 8.0 02/09/2022 1631   RBC 4.65 02/09/2022 1631   HGB 12.6 02/09/2022 1631   HGB 11.6 07/24/2021 1249   HGB 12.0 07/17/2008 1449   HCT 38.6 02/09/2022 1631   HCT 36.7 07/24/2021 1249   HCT 35.6 07/17/2008 1449   PLT 180.0 02/09/2022 1631   PLT 382 07/24/2021 1249   MCV 83.1 02/09/2022 1631   MCV 81 07/24/2021 1249   MCV 83.2 07/17/2008 1449   MCH 24.9 (L) 09/25/2021 1958   MCHC 32.5 02/09/2022 1631   RDW 16.0 (H) 02/09/2022 1631   RDW 14.8 07/24/2021 1249  RDW 14.6 (H) 07/17/2008 1449   LYMPHSABS 1.2 02/09/2022 1631   LYMPHSABS 1.0 07/17/2008 1449   MONOABS 0.3 02/09/2022 1631   MONOABS 0.2 07/17/2008 1449   EOSABS 0.1 02/09/2022 1631   EOSABS 0.1 07/17/2008 1449   BASOSABS 0.0 02/09/2022 1631   BASOSABS 0.0 07/17/2008 1449    BMET    Component Value Date/Time   NA 139 11/03/2021 1610   K 4.6 11/03/2021 1610   CL 100 11/03/2021 1610   CO2 23 11/03/2021 1610   GLUCOSE 182 (H) 11/03/2021 1610   GLUCOSE 114 (H) 09/25/2021 1958   BUN 20 11/03/2021 1610   CREATININE 1.09 (H) 11/03/2021 1610   CALCIUM 11.0  (H) 11/03/2021 1610   GFRNONAA 58 (L) 09/25/2021 1958   GFRAA 66 10/13/2020 1653    BNP No results found for: "BNP"  ProBNP    Component Value Date/Time   PROBNP 71.8 04/29/2011 1651    Imaging: No results found.       Latest Ref Rng & Units 07/02/2022    3:16 PM  PFT Results  DLCO uncorrected ml/min/mmHg 25.91  P  DLCO UNC% % 120  P  DLCO corrected ml/min/mmHg 25.91  P  DLCO COR %Predicted % 120  P  DLVA Predicted % 133  P  TLC L 5.56  P  TLC % Predicted % 101  P  RV % Predicted % 107  P    P Preliminary result    Lab Results  Component Value Date   NITRICOXIDE 17 05/07/2016        Assessment & Plan:   Cough  Chronic cough with suspected upper airway irritation from postnasal drainage and GERD. High-resolution CT chest get it for IPF or acute process.  CT sinus negative.  Allergy profile did show elevated IgE and allergens.  Recommend restarting Zyrtec and Flonase for trigger prevention. Unable to do PFTs today.  May have a component of reactive airways or mild intermittent asthma/cough variant asthma.  Would use albuterol as needed.  On return visit could consider adding Singulair if cough continues.  Plan Patient Instructions  Restart Zyrtec $RemoveBefore'10mg'[id number]$  daily  Restart Flonase 2 puffs daily  Continue on Aciphex Twice daily   Deslym 2 tsp Twice daily  for cough, as needed  Tessalon Three times a day  for cough As needed   Albuterol inhaler As needed   Establish with dentist .  Follow up with Dr. Chase Caller in 3 months and As needed   Please contact office for sooner follow up if symptoms do not improve or worsen or seek emergency care         Laryngopharyngeal reflux (LPR) Continue on Aciphex twice daily.  GERD diet  Allergic rhinitis Restart Zyrtec and Flonase.  Dental caries Patient needs to establish with dentistry     Rexene Edison, NP 07/02/2022

## 2022-07-02 NOTE — Assessment & Plan Note (Signed)
Continue on Aciphex twice daily.  GERD diet

## 2022-07-02 NOTE — Assessment & Plan Note (Signed)
Patient needs to establish with dentistry

## 2022-07-02 NOTE — Patient Instructions (Signed)
DLCO and Pleth completed;patient not able to complete spirometry without errors.

## 2022-07-02 NOTE — Progress Notes (Signed)
Patient was not able to complete spirometry without error. DLCO and Pleth completed.

## 2022-07-06 ENCOUNTER — Ambulatory Visit: Payer: Medicare PPO | Admitting: Podiatry

## 2022-07-09 NOTE — Progress Notes (Unsigned)
Cardiology Office Note:    Date:  07/12/2022   ID:  MAHOGANY TORRANCE, DOB Mar 06, 1951, MRN [id number]  PCP:  Lucianne Lei, MD  [facility] Cardiologist:  Freada Bergeron, MD  [facility] Electrophysiologist:  None   Referring MD: Lucianne Lei, MD    History of Present Illness:    Jessica Yates is a 71 y.o. female with a hx of asthma, diastolic dysfunction, chronic lymphedema, DMII, CKD stage 3, and DVT who presents to clinic for follow-up.   Patient has known history of chronic lymphedema (started at age 49) and has recurrent cellulitis managed by vascular. Has difficulty ambulating due to LE swelling and arthritis. Walks with cane, but no exertional symptoms. No known history of CAD. Was told she had "congestive heart failure," but is unsure why she was diagnosed with this. States she has never been hospitalized for fluid in her lungs, worsening SOB, or chest pain. Denies chest pain, SOB on exertion, palpitations, nausea or vomiting, abdominal pain. No orthopnea or PND. For her lymphedema, she does LE wrapping, compression stockings, LE elevation, and lasix. TTE in 2019 with normal BiV function, G1DD, no valvular abnormalities.    Last seen in clinic by Melina Copa on 07/24/21 where she was feeling well. Chronic lymphedema persisted. TTE with normal EF, mild RV dysfunction, borderline aorta 60m. Ca score 18 (54%).  Was seen in clinic on 10/2021 where she was having LE edema in the setting of not taking her lasix as it made her urinate a lot. We increased her lasix to 466mBID x3 days and then daily thereafter.   Was last seen on 01/2022 where she was stable from a CV standpoint.  Today, ***   Past Medical History:  Diagnosis Date   Allergic rhinitis    Anemia 2002   Anxiety    Arthritis    Asthma    Blood clot in vein    Cellulitis    Cellulitis of right lower extremity    Chronic diastolic (congestive) heart failure (HCC)    Chronic kidney disease, stage 3 (HCC)     Coagulation defect (HCC)    Cough    Depression 02/06/2008   Diabetes mellitus, type 2 (HCC)    DVT (deep venous thrombosis) ([facility]   left leg   Dysphagia    Elevated hemoglobin A1c    GERD (gastroesophageal reflux disease)    Glaucoma    Holosystolic murmur    Kidney cysts    Leukocytosis    Liver cyst    Lymphedema    Morbid obesity ([facility]   Overactive bladder    Palpitations    Peripheral venous insufficiency    Primary open angle glaucoma (POAG) of both eyes, mild stage    Seasonal allergies    Sickle cell trait (HCC)    Tortuous aorta (HCC)    Unstable gait    Upper airway cough syndrome    Vitamin D deficiency     Past Surgical History:  Procedure Laterality Date   COSMETIC SURGERY  1975   nasal reconstruction   TOTAL ABDOMINAL HYSTERECTOMY  07/2001    Current Medications: Current Meds  Medication Sig   acetaminophen (TYLENOL) 500 MG tablet Take 1,000 mg by mouth every 6 (six) hours as needed for mild pain.    albuterol (VENTOLIN HFA) 108 (90 Base) MCG/ACT inhaler Inhale 2 puffs into the lungs every 6 (six) hours as needed for wheezing or shortness of breath.   aspirin 81 MG  chewable tablet Chew 1 tablet every day by oral route.   benzonatate (TESSALON) 200 MG capsule Take 1 capsule (200 mg total) by mouth 3 (three) times daily as needed.   blood glucose meter kit and supplies KIT Dispense based on patient and insurance preference. Use up to four times daily as directed. (FOR ICD-9 250.00, 250.01).   cetirizine (ZYRTEC) 10 MG tablet Take 1 tablet (10 mg total) by mouth daily.   Cholecalciferol 25 MCG (1000 UT) capsule TAKE 1 CAPSULE (1,000 UNITS TOTAL) BY MOUTH DAILY.   dapagliflozin propanediol (FARXIGA) 10 MG TABS tablet Take 10 mg by mouth daily.   famotidine (PEPCID) 40 MG tablet TAKE 1 TABLET BY MOUTH EVERYDAY AT BEDTIME   fluticasone (FLONASE) 50 MCG/ACT nasal spray Place 2 sprays into both nostrils daily.   furosemide (LASIX) 40 MG tablet Take 1  tablet by mouth twice a day X's 3 days then go down to 1 tablet by mouth daily   ipratropium (ATROVENT) 0.03 % nasal spray    ketoconazole (NIZORAL) 2 % cream Apply to both feet and between toes once daily for 6 weeks.   losartan (COZAAR) 25 MG tablet Take 12.5 mg by mouth daily.   RABEprazole (ACIPHEX) 20 MG tablet TAKE 1 TABLET (20 MG TOTAL) BY MOUTH 2 (TWO) TIMES DAILY. TAKE ONE TABLET BEFORE BREAKFAST, AND ONE TABLET BEFORE DINNER   rosuvastatin (CRESTOR) 40 MG tablet Take 1 tablet (40 mg total) by mouth daily.   timolol (TIMOPTIC) 0.5 % ophthalmic solution 1 drop 2 (two) times daily.   TRAVATAN Z 0.004 % SOLN ophthalmic solution Place 1 drop into both eyes at bedtime.      Allergies:   Patient has no known allergies.   Social History   Socioeconomic History   Marital status: Single    Spouse name: Not on file   Number of children: 0   Years of education: Not on file   Highest education level: Not on file  Occupational History   Occupation: retired from [employer]  Tobacco Use   Smoking status: Never   Smokeless tobacco: Never  Vaping Use   Vaping Use: Never used  Substance and Sexual Activity   Alcohol use: No   Drug use: No   Sexual activity: Never  Other Topics Concern   Not on file  Social History Narrative   Lives with mother and brother         Social Determinants of Health   Financial Resource Strain: Not on file  Food Insecurity: Not on file  Transportation Needs: Not on file  Physical Activity: Not on file  Stress: Not on file  Social Connections: Not on file     Family History: The patient's family history includes Allergies in her mother and sister; Arthritis in her mother and sister; Asthma in her mother; Birth defects in her maternal grandmother; Breast cancer in her maternal grandmother and mother; Cancer in her mother; Cancer (age of onset: 69) in her father; Diabetes in her mother; Heart disease in her maternal grandmother;  Hyperlipidemia in her mother; Hypertension in her mother; Other in her mother.  ROS:   Please see the history of present illness.    Review of Systems  Constitutional:  Negative for chills and fever.  HENT:  Negative for congestion.   Eyes:  Negative for blurred vision.  Respiratory:  Negative for shortness of breath.   Cardiovascular:  Positive for leg swelling. Negative for chest pain, palpitations, orthopnea, claudication and  PND.  Gastrointestinal:  Negative for nausea and vomiting.  Genitourinary:  Negative for hematuria.  Musculoskeletal:  Negative for falls.  Neurological:  Negative for dizziness and loss of consciousness.  Endo/Heme/Allergies:  Negative for polydipsia.  Psychiatric/Behavioral:  Negative for substance abuse.     EKGs/Labs/Other Studies Reviewed:    The following studies were reviewed today:  NM Pulmonary Perfusion 01/01/2022: FINDINGS: There is homogeneous perfusion throughout the lungs. No focal perfusion defect noted.   IMPRESSION: Normal study.  Left LE Venous Doppler 09/26/2021: Summary:  RIGHT:  - No evidence of common femoral vein obstruction.     LEFT:  - There is no evidence of deep vein thrombosis in the lower extremity.  However, portions of this examination were limited- see technologist  comments above.     - No cystic structure found in the popliteal fossa.  Ca score 08/2021: FINDINGS: Coronary arteries: Normal origins.   Coronary Calcium Score:   Left main: 0   Left anterior descending artery: 18.8   Left circumflex artery: 0   Right coronary artery: 0   Total: 18.8   Percentile: 52nd   Pericardium: Normal.   Ascending Aorta: Normal caliber.   Non-cardiac: See separate report from [facility].   IMPRESSION: Coronary calcium score of 18.8. This was 52nd percentile for age-, race-, and sex-matched controls.  TTE 08/2021: IMPRESSIONS    1. Left ventricular ejection fraction, by estimation, is 70 to 75%.  The  left ventricle has hyperdynamic function. The left ventricle has no  regional wall motion abnormalities. Left ventricular diastolic parameters  are indeterminate.   2. Right ventricular systolic function is mildly reduced. The right  ventricular size is mildly enlarged. There is normal pulmonary artery  systolic pressure.   3. Left atrial size was mildly dilated.   4. The mitral valve is normal in structure. Trivial mitral valve  regurgitation. No evidence of mitral stenosis.   5. The aortic valve is tricuspid. Aortic valve regurgitation is not  visualized. No aortic stenosis is present.   6. Aortic dilatation noted. There is borderline dilatation of the  ascending aorta, measuring 36 mm.   7. The inferior vena cava is normal in size with greater than 50%  respiratory variability, suggesting right atrial pressure of 3 mmHg.   07/26/18: Study Conclusions  - Left ventricle: The cavity size was normal. Wall thickness was    normal. Systolic function was normal. The estimated ejection    fraction was in the range of 55% to 60%. Wall motion was normal;    there were no regional wall motion abnormalities. Doppler    parameters are consistent with abnormal left ventricular    relaxation (grade 1 diastolic dysfunction). The E/e&' ratio is    between 8-15, suggesting inderminate LV filling pressure.  - Mitral valve: Mildly thickened leaflets . There was trivial    regurgitation.  - Left atrium: The atrium was normal in size.  - Inferior vena cava: The vessel was normal in size. The    respirophasic diameter changes were in the normal range (>= 50%),    consistent with normal central venous pressure.   Impressions:   - LVEF 55-60%, normal wall thickness, normal wall motion, grade 1    DD, indeterminate LV filling pressure, trivial MR, normal LA    size, normal IVC.   LE doppler ultrasound 08/06/19:  Summary:  Right: There is no evidence of deep vein thrombosis in the lower   extremity. No cystic structure found in the  popliteal fossa.  Left: No evidence of common femoral vein obstruction.   CTA 12/28/15: FINDINGS: There are no filling defects within the pulmonary arteries to suggest pulmonary embolus.   Mild multi chamber cardiomegaly. Thoracic aorta is normal in caliber, tortuous in its course. No dissection or aneurysm. Conventional branching pattern from the aortic arch. No mediastinal or hilar adenopathy. No pericardial effusion.   No pleural effusion. Dependent linear atelectasis in both lower lobes. Mild biapical pleural parenchymal scarring. No consolidation, pulmonary mass or suspicious nodule.   No acute abnormality in the included upper abdomen.   There are no acute or suspicious osseous abnormalities. Unchanged exaggerated thoracic kyphosis with associated degenerative change.   Review of the MIP images confirms the above findings.   IMPRESSION: 1. No pulmonary embolus. 2. Mild multi chamber cardiomegaly without pulmonary edema.   EKG:  EKG is personally reviewed. 01/18/2022: EKG was not ordered. 07/24/2021 (Dayna Dunn, PA-C): sinus tach 112bpm, nonspecific diffuse TWI inferiorly, V3-V6   Recent Labs: 07/24/2021: TSH 0.988 11/03/2021: BUN 20; Creatinine, Ser 1.09; Potassium 4.6; Sodium 139 01/14/2022: ALT 13 02/09/2022: Hemoglobin 12.6; Platelets 180.0   Recent Lipid Panel    Component Value Date/Time   CHOL 160 01/14/2022 1258   TRIG 38 01/14/2022 1258   HDL 97 01/14/2022 1258   CHOLHDL 1.6 01/14/2022 1258   LDLCALC 54 01/14/2022 1258   LDLDIRECT 71 07/24/2021 1249     Physical Exam:    VS:  BP 92/60   Pulse 90   Ht _0  (1.702 m)   Wt 220 lb (99.8 kg)   SpO2 97%   BMI 34.46 kg/m     Wt Readings from Last 3 Encounters:  07/12/22 220 lb (99.8 kg)  07/02/22 224 lb (101.6 kg)  02/09/22 224 lb 6.4 oz (101.8 kg)     GEN:  Well nourished, well developed in no acute distress HEENT: Normal NECK: No JVD; No carotid  bruits CARDIAC: RRR, 1/6 systolic murmur. No rubs or gallops RESPIRATORY:  Clear to auscultation without rales, wheezing or rhonchi  ABDOMEN: Soft, non-tender, non-distended MUSCULOSKELETAL:  Significant lymphedema with L>R (chronic) SKIN: Warm and dry NEUROLOGIC:  Alert and oriented x 3 PSYCHIATRIC:  Normal affect   ASSESSMENT:    1. Lymphedema   2. Chronic venous insufficiency   3. Pure hypercholesterolemia   4. Type 2 diabetes mellitus without complication, without long-term current use of insulin ([place])   5. Diastolic dysfunction      PLAN:    In order of problems listed above:  #Chronic Lymphedema #Chronic Venous Insufficiency Diagnosed at age 53. Patient with severe, bilateral LE with L>>R. Followed by vascular and currently using compression stockings, lasix, feet elevation with plans for pneumatic compression devices. Has had complications with cellulitis in the past. -Continue follow-up with vascular as scheduled -Continue compression stockings, feet elevation -Continue lasix 00TM daily   #Diastolic dysfunction: Patient with normal EF and G1DD on TTE. Suspect she was diagnosed with chronic diastolic HF in the setting of LE edema which is truly secondary to lymphedema. Per patient, she has never been hospitalized for HF or had pulmonary edema. Will continue to watch her blood pressure closely, but has been wall controlled. Will continue to monitor for symptoms -Continue lasix 104m daily -Continue K 246m with each dose of lasix -Encouraged compliance with lasix   #DMII: Managed by PCP -Continue farxiga 1083maily   #HLD: Ca score 18 (54%). Tolerating crestor and LDL at goal at 54. -Continue crestor 32m27mily  #  OSA: -Mild on sleep study; not currently on CPAP  Follow-up:  6 months.  Medication Adjustments/Labs and Tests Ordered: Current medicines are reviewed at length with the patient today.  Concerns regarding medicines are outlined above.   No orders of  the defined types were placed in this encounter.  No orders of the defined types were placed in this encounter.  There are no Patient Instructions on file for this visit.  I,Mathew Stumpf,acting as a Education administrator for Freada Bergeron, MD.,have documented all relevant documentation on the behalf of Freada Bergeron, MD,as directed by  Freada Bergeron, MD while in the presence of Freada Bergeron, MD.  I, Freada Bergeron, MD, have reviewed all documentation for this visit. The documentation on 07/12/22 for the exam, diagnosis, procedures, and orders are all accurate and complete.    Signed, Freada Bergeron, MD  07/12/2022 4:40 PM    [facility]

## 2022-07-12 ENCOUNTER — Ambulatory Visit: Payer: Medicare PPO | Attending: Nurse Practitioner | Admitting: Cardiology

## 2022-07-12 ENCOUNTER — Ambulatory Visit: Payer: Medicare PPO | Admitting: Nurse Practitioner

## 2022-07-12 ENCOUNTER — Encounter: Payer: Self-pay | Admitting: Cardiology

## 2022-07-12 VITALS — BP 92/60 | HR 90 | Ht 67.0 in | Wt 220.0 lb

## 2022-07-12 DIAGNOSIS — I872 Venous insufficiency (chronic) (peripheral): Secondary | ICD-10-CM

## 2022-07-12 DIAGNOSIS — I5189 Other ill-defined heart diseases: Secondary | ICD-10-CM | POA: Diagnosis not present

## 2022-07-12 DIAGNOSIS — I890 Lymphedema, not elsewhere classified: Secondary | ICD-10-CM

## 2022-07-12 DIAGNOSIS — E119 Type 2 diabetes mellitus without complications: Secondary | ICD-10-CM | POA: Diagnosis not present

## 2022-07-12 DIAGNOSIS — E7800 Pure hypercholesterolemia, unspecified: Secondary | ICD-10-CM | POA: Diagnosis not present

## 2022-07-12 NOTE — Patient Instructions (Signed)
Medication Instructions:   Your physician recommends that you continue on your current medications as directed. Please refer to the Current Medication list given to you today.  *If you need a refill on your cardiac medications before your next appointment, please call your pharmacy*    Follow-Up: At [facility], you and your health needs are our priority.  As part of our continuing mission to provide you with exceptional heart care, we have created designated Provider Care Teams.  These Care Teams include your primary Cardiologist (physician) and Advanced Practice Providers (APPs -  Physician Assistants and Nurse Practitioners) who all work together to provide you with the care you need, when you need it.  We recommend signing up for the patient portal called "MyChart".  Sign up information is provided on this After Visit Summary.  MyChart is used to connect with patients for Virtual Visits (Telemedicine).  Patients are able to view lab/test results, encounter notes, upcoming appointments, etc.  Non-urgent messages can be sent to your provider as well.   To learn more about what you can do with MyChart, go to [email or web address].    Your next appointment:   6 month(s)  The format for your next appointment:   In Person  Provider:   Robbie Lis, PA-C, Nicholes Rough, PA-C, Melina Copa, PA-C, Ambrose Pancoast, NP, Cecilie Kicks, NP, Ermalinda Barrios, PA-C, Christen Bame, NP, or Richardson Dopp, PA-C          Important Information About Sugar

## 2022-07-12 NOTE — Progress Notes (Signed)
Cardiology Office Note:    Date:  07/12/2022   ID:  Jessica Yates, DOB 1951/08/02, MRN [id number]  PCP:  Jessica Lei, MD  [facility] Cardiologist:  Jessica Bergeron, MD  [facility] Electrophysiologist:  None   Referring MD: Jessica Lei, MD    History of Present Illness:    Jessica Yates is a 71 y.o. female with a hx of asthma, diastolic dysfunction, chronic lymphedema, DMII, CKD stage 3, and DVT who presents to clinic for follow-up.   Patient has known history of chronic lymphedema (started at age 4) and has recurrent cellulitis managed by vascular. Has difficulty ambulating due to LE swelling and arthritis. Walks with cane, but no exertional symptoms. No known history of CAD. Was told she had "congestive heart failure," but is unsure why she was diagnosed with this. States she has never been hospitalized for fluid in her lungs, worsening SOB, or chest pain. Denies chest pain, SOB on exertion, palpitations, nausea or vomiting, abdominal pain. No orthopnea or PND. For her lymphedema, she does LE wrapping, compression stockings, LE elevation, and lasix. TTE in 2019 with normal BiV function, G1DD, no valvular abnormalities.    Last seen in clinic by Jessica Yates on 07/24/21 where she was feeling well. Chronic lymphedema persisted. TTE with normal EF, mild RV dysfunction, borderline aorta 67m. Ca score 18 (54%).  Was seen in clinic on 10/2021 where she was having LE edema in the setting of not taking her lasix as it made her urinate a lot. We increased her lasix to 476mBID x3 days and then daily thereafter.   Was last seen on 01/2022 where she was stable from a CV standpoint.  Today, she is accompanied by her brother, MeDalma Panchalwho is also a patient of mine. They both have appointments scheduled today and wish to be seen together.   The patient states that since yesterday she has noticed some LLE weeping. Her lymphedema is chronic but the weeping is a new  development. Last night she took the trash out, and believes she may have bumped her left leg. Generally she is compliant with her 40 mg Lasix once daily. Once in a while she will take Lasix every other day. Most of the time she does wear her compression socks. She denies feeling any dizziness, palpitations, or chest pain.  Last night she felt like she was experiencing mild asthmatic symptoms, but this did not require the use of her inhaler.  Recently she saw her nephrologist who stopped her potassium and instructed her to take 1/2 tablet of 25 mg losartan. In clinic today her blood pressure is 92/60. She denies any lightheadedness, dizziness or syncope.  She reports that her sleep study was negative for sleep apnea.  She denies any headaches, syncope, orthopnea, or PND.   Past Medical History:  Diagnosis Date   Allergic rhinitis    Anemia 2002   Anxiety    Arthritis    Asthma    Blood clot in vein    Cellulitis    Cellulitis of right lower extremity    Chronic diastolic (congestive) heart failure (HCC)    Chronic kidney disease, stage 3 (HCC)    Coagulation defect (HCC)    Cough    Depression 02/06/2008   Diabetes mellitus, type 2 (HCCary   DVT (deep venous thrombosis) ([place]2002   left leg   Dysphagia    Elevated hemoglobin A1c    GERD (gastroesophageal reflux disease)  Glaucoma    Holosystolic murmur    Kidney cysts    Leukocytosis    Liver cyst    Lymphedema    Morbid obesity (HCC)    Overactive bladder    Palpitations    Peripheral venous insufficiency    Primary open angle glaucoma (POAG) of both eyes, mild stage    Seasonal allergies    Sickle cell trait (HCC)    Tortuous aorta (HCC)    Unstable gait    Upper airway cough syndrome    Vitamin D deficiency     Past Surgical History:  Procedure Laterality Date   COSMETIC SURGERY  1975   nasal reconstruction   TOTAL ABDOMINAL HYSTERECTOMY  07/2001    Current Medications: Current Meds  Medication Sig    acetaminophen (TYLENOL) 500 MG tablet Take 1,000 mg by mouth every 6 (six) hours as needed for mild pain.    albuterol (VENTOLIN HFA) 108 (90 Base) MCG/ACT inhaler Inhale 2 puffs into the lungs every 6 (six) hours as needed for wheezing or shortness of breath.   aspirin 81 MG chewable tablet Chew 1 tablet every day by oral route.   benzonatate (TESSALON) 200 MG capsule Take 1 capsule (200 mg total) by mouth 3 (three) times daily as needed.   blood glucose meter kit and supplies KIT Dispense based on patient and insurance preference. Use up to four times daily as directed. (FOR ICD-9 250.00, 250.01).   cetirizine (ZYRTEC) 10 MG tablet Take 1 tablet (10 mg total) by mouth daily.   Cholecalciferol 25 MCG (1000 UT) capsule TAKE 1 CAPSULE (1,000 UNITS TOTAL) BY MOUTH DAILY.   dapagliflozin propanediol (FARXIGA) 10 MG TABS tablet Take 10 mg by mouth daily.   famotidine (PEPCID) 40 MG tablet TAKE 1 TABLET BY MOUTH EVERYDAY AT BEDTIME   fluticasone (FLONASE) 50 MCG/ACT nasal spray Place 2 sprays into both nostrils daily.   furosemide (LASIX) 40 MG tablet Take 1 tablet by mouth twice a day X's 3 days then go down to 1 tablet by mouth daily   ipratropium (ATROVENT) 0.03 % nasal spray    ketoconazole (NIZORAL) 2 % cream Apply to both feet and between toes once daily for 6 weeks.   losartan (COZAAR) 25 MG tablet Take 12.5 mg by mouth daily.   RABEprazole (ACIPHEX) 20 MG tablet TAKE 1 TABLET (20 MG TOTAL) BY MOUTH 2 (TWO) TIMES DAILY. TAKE ONE TABLET BEFORE BREAKFAST, AND ONE TABLET BEFORE DINNER   rosuvastatin (CRESTOR) 40 MG tablet Take 1 tablet (40 mg total) by mouth daily.   timolol (TIMOPTIC) 0.5 % ophthalmic solution 1 drop 2 (two) times daily.   TRAVATAN Z 0.004 % SOLN ophthalmic solution Place 1 drop into both eyes at bedtime.      Allergies:   Patient has no known allergies.   Social History   Socioeconomic History   Marital status: Single    Spouse name: Not on file   Number of children: 0    Years of education: Not on file   Highest education level: Not on file  Occupational History   Occupation: retired from [employer]  Tobacco Use   Smoking status: Never   Smokeless tobacco: Never  Vaping Use   Vaping Use: Never used  Substance and Sexual Activity   Alcohol use: No   Drug use: No   Sexual activity: Never  Other Topics Concern   Not on file  Social History Narrative   Lives with mother and brother  Social Determinants of Health   Financial Resource Strain: Not on file  Food Insecurity: Not on file  Transportation Needs: Not on file  Physical Activity: Not on file  Stress: Not on file  Social Connections: Not on file     Family History: The patient's family history includes Allergies in her mother and sister; Arthritis in her mother and sister; Asthma in her mother; Birth defects in her maternal grandmother; Breast cancer in her maternal grandmother and mother; Cancer in her mother; Cancer (age of onset: 49) in her father; Diabetes in her mother; Heart disease in her maternal grandmother; Hyperlipidemia in her mother; Hypertension in her mother; Other in her mother.  ROS:   Please see the history of present illness.    Review of Systems  Constitutional:  Negative for chills and fever.  HENT:  Negative for congestion.   Eyes:  Negative for blurred vision.  Respiratory:  Negative for shortness of breath.   Cardiovascular:  Positive for leg swelling. Negative for chest pain, palpitations, orthopnea, claudication and PND.  Gastrointestinal:  Negative for nausea and vomiting.  Genitourinary:  Negative for hematuria.  Musculoskeletal:  Negative for falls.  Neurological:  Negative for dizziness and loss of consciousness.  Endo/Heme/Allergies:  Negative for polydipsia.  Psychiatric/Behavioral:  Negative for substance abuse.     EKGs/Labs/Other Studies Reviewed:    The following studies were reviewed today:  CT Chest   03/22/2022: IMPRESSION: 1. No evidence of interstitial lung disease. 2. Round atelectasis in the posteromedial right lower lobe. 3. Aortic atherosclerosis (ICD10-I70.0). Coronary artery calcification. 4. Enlarged pulmonic trunk, indicative of pulmonary arterial hypertension.  NM Pulmonary Perfusion 01/01/2022: FINDINGS: There is homogeneous perfusion throughout the lungs. No focal perfusion defect noted.   IMPRESSION: Normal study.  Left LE Venous Doppler 09/26/2021: Summary:  RIGHT:  - No evidence of common femoral vein obstruction.     LEFT:  - There is no evidence of deep vein thrombosis in the lower extremity.  However, portions of this examination were limited- see technologist  comments above.     - No cystic structure found in the popliteal fossa.  Ca score 08/2021: FINDINGS: Coronary arteries: Normal origins.   Coronary Calcium Score:   Left main: 0   Left anterior descending artery: 18.8   Left circumflex artery: 0   Right coronary artery: 0   Total: 18.8   Percentile: 52nd   Pericardium: Normal.   Ascending Aorta: Normal caliber.   Non-cardiac: See separate report from [facility].   IMPRESSION: Coronary calcium score of 18.8. This was 52nd percentile for age-, race-, and sex-matched controls.  TTE 08/2021: IMPRESSIONS    1. Left ventricular ejection fraction, by estimation, is 70 to 75%. The  left ventricle has hyperdynamic function. The left ventricle has no  regional wall motion abnormalities. Left ventricular diastolic parameters  are indeterminate.   2. Right ventricular systolic function is mildly reduced. The right  ventricular size is mildly enlarged. There is normal pulmonary artery  systolic pressure.   3. Left atrial size was mildly dilated.   4. The mitral valve is normal in structure. Trivial mitral valve  regurgitation. No evidence of mitral stenosis.   5. The aortic valve is tricuspid. Aortic valve regurgitation is  not  visualized. No aortic stenosis is present.   6. Aortic dilatation noted. There is borderline dilatation of the  ascending aorta, measuring 36 mm.   7. The inferior vena cava is normal in size with greater than 50%  respiratory  variability, suggesting right atrial pressure of 3 mmHg.   07/26/18: Study Conclusions  - Left ventricle: The cavity size was normal. Wall thickness was    normal. Systolic function was normal. The estimated ejection    fraction was in the range of 55% to 60%. Wall motion was normal;    there were no regional wall motion abnormalities. Doppler    parameters are consistent with abnormal left ventricular    relaxation (grade 1 diastolic dysfunction). The E/e&' ratio is    between 8-15, suggesting inderminate LV filling pressure.  - Mitral valve: Mildly thickened leaflets . There was trivial    regurgitation.  - Left atrium: The atrium was normal in size.  - Inferior vena cava: The vessel was normal in size. The    respirophasic diameter changes were in the normal range (>= 50%),    consistent with normal central venous pressure.   Impressions:   - LVEF 55-60%, normal wall thickness, normal wall motion, grade 1    DD, indeterminate LV filling pressure, trivial MR, normal LA    size, normal IVC.   LE doppler ultrasound 08/06/19:  Summary:  Right: There is no evidence of deep vein thrombosis in the lower  extremity. No cystic structure found in the popliteal fossa.  Left: No evidence of common femoral vein obstruction.   CTA 12/28/15: FINDINGS: There are no filling defects within the pulmonary arteries to suggest pulmonary embolus.   Mild multi chamber cardiomegaly. Thoracic aorta is normal in caliber, tortuous in its course. No dissection or aneurysm. Conventional branching pattern from the aortic arch. No mediastinal or hilar adenopathy. No pericardial effusion.   No pleural effusion. Dependent linear atelectasis in both lower lobes. Mild biapical  pleural parenchymal scarring. No consolidation, pulmonary mass or suspicious nodule.   No acute abnormality in the included upper abdomen.   There are no acute or suspicious osseous abnormalities. Unchanged exaggerated thoracic kyphosis with associated degenerative change.   Review of the MIP images confirms the above findings.   IMPRESSION: 1. No pulmonary embolus. 2. Mild multi chamber cardiomegaly without pulmonary edema.   EKG:  EKG is personally reviewed. 07/12/2022:  EKG was not ordered. 01/18/2022: EKG was not ordered. 07/24/2021 (Dayna Dunn, PA-C): sinus tach 112bpm, nonspecific diffuse TWI inferiorly, V3-V6   Recent Labs: 07/24/2021: TSH 0.988 11/03/2021: BUN 20; Creatinine, Ser 1.09; Potassium 4.6; Sodium 139 01/14/2022: ALT 13 02/09/2022: Hemoglobin 12.6; Platelets 180.0   Recent Lipid Panel    Component Value Date/Time   CHOL 160 01/14/2022 1258   TRIG 38 01/14/2022 1258   HDL 97 01/14/2022 1258   CHOLHDL 1.6 01/14/2022 1258   LDLCALC 54 01/14/2022 1258   LDLDIRECT 71 07/24/2021 1249     Physical Exam:    VS:  BP 92/60   Pulse 90   Ht 5' 7" (1.702 m)   Wt 220 lb (99.8 kg)   SpO2 97%   BMI 34.46 kg/m     Wt Readings from Last 3 Encounters:  07/12/22 220 lb (99.8 kg)  07/02/22 224 lb (101.6 kg)  02/09/22 224 lb 6.4 oz (101.8 kg)     GEN:  Well nourished, well developed in no acute distress HEENT: Normal NECK: No JVD; No carotid bruits CARDIAC: RRR, 1/6 systolic murmur. No rubs or gallops RESPIRATORY:  Clear to auscultation without rales, wheezing or rhonchi  ABDOMEN: Soft, non-tender, non-distended MUSCULOSKELETAL:  Significant lymphedema with L>R (chronic), LLE weeping but no significant erythema SKIN: Warm and dry NEUROLOGIC:  Alert and  oriented x 3 PSYCHIATRIC:  Normal affect   ASSESSMENT:    1. Lymphedema   2. Chronic venous insufficiency   3. Pure hypercholesterolemia   4. Type 2 diabetes mellitus without complication, without long-term  current use of insulin ([place])   5. Diastolic dysfunction     PLAN:    In order of problems listed above:  #Chronic Lymphedema #Chronic Venous Insufficiency #Cellulitis: Diagnosed at age 31. Patient with severe, bilateral LE with L>>R. Followed by vascular and currently using compression stockings, lasix, feet elevation with plans for pneumatic compression devices. Has had complications with cellulitis in the past. -Continue follow-up with vascular as scheduled -Continue compression stockings, feet elevation -Continue lasix 23FT daily   #Diastolic dysfunction: Patient with normal EF and G1DD on TTE. Suspect she was diagnosed with chronic diastolic HF in the setting of LE edema which is truly secondary to lymphedema. Per patient, she has never been hospitalized for HF or had pulmonary edema. Will continue to watch her blood pressure closely, but has been wall controlled. Will continue to monitor for symptoms -Continue lasix 59m daily -Continue K 273m with each dose of lasix -Encouraged compliance with lasix   #DMII: Managed by PCP -Continue glipizide   #HLD: Ca score 18 (54%). Tolerating crestor and LDL at goal at 54. -Continue crestor 4058maily  #OSA: -Mild on sleep study; deemed that CPAP is not needed at this time  Follow-up:  6 months.  Medication Adjustments/Labs and Tests Ordered: Current medicines are reviewed at length with the patient today.  Concerns regarding medicines are outlined above.   No orders of the defined types were placed in this encounter.  No orders of the defined types were placed in this encounter.  Patient Instructions  Medication Instructions:   Your physician recommends that you continue on your current medications as directed. Please refer to the Current Medication list given to you today.  *If you need a refill on your cardiac medications before your next appointment, please call your pharmacy*    Follow-Up: [facility].  As part of our continuing mission to provide you with exceptional heart care, we have created designated Provider Care Teams.  These Care Teams include your primary Cardiologist (physician) and Advanced Practice Providers (APPs -  Physician Assistants and Nurse Practitioners) who all work together to provide you with the care you need, when you need it.  We recommend signing up for the patient portal called "MyChart".  Sign up information is provided on this After Visit Summary.  MyChart is used to connect with patients for Virtual Visits (Telemedicine).  Patients are able to view lab/test results, encounter notes, upcoming appointments, etc.  Non-urgent messages can be sent to your provider as well.   To learn more about what you can do with MyChart, go to [email or web address]  Your next appointment:   6 month(s)  The format for your next appointment:   In Person  Provider:   VinRobbie LisA-C, TesNicholes RoughA-C, DayMelina CopaA-C, ErnAmbrose PancoastP, LauCecilie KicksP, MicErmalinda BarriosA-C, MicChristen BameP, or ScoRichardson DoppA-C          Important Information About Sugar        I,Mathew Stumpf,acting as a scribe for HeaFreada BergeronD.,have documented all relevant documentation on the behalf of HeaFreada BergeronD,as directed by  HeaFreada BergeronD while in the presence of HeaFreada BergeronD.  I, Jessica Bergeron, MD, have reviewed all documentation for this visit. The documentation on 07/12/22 for the exam, diagnosis, procedures, and orders are all accurate and complete.    Signed, Jessica Bergeron, MD  07/12/2022 5:35 PM    [place]

## 2022-07-13 ENCOUNTER — Ambulatory Visit (INDEPENDENT_AMBULATORY_CARE_PROVIDER_SITE_OTHER): Payer: Medicare PPO | Admitting: Podiatry

## 2022-07-13 ENCOUNTER — Telehealth: Payer: Self-pay

## 2022-07-13 DIAGNOSIS — B351 Tinea unguium: Secondary | ICD-10-CM

## 2022-07-13 DIAGNOSIS — N183 Chronic kidney disease, stage 3 unspecified: Secondary | ICD-10-CM | POA: Diagnosis not present

## 2022-07-13 DIAGNOSIS — Z794 Long term (current) use of insulin: Secondary | ICD-10-CM | POA: Diagnosis not present

## 2022-07-13 DIAGNOSIS — M79675 Pain in left toe(s): Secondary | ICD-10-CM | POA: Diagnosis not present

## 2022-07-13 DIAGNOSIS — M79674 Pain in right toe(s): Secondary | ICD-10-CM | POA: Diagnosis not present

## 2022-07-13 DIAGNOSIS — E0822 Diabetes mellitus due to underlying condition with diabetic chronic kidney disease: Secondary | ICD-10-CM

## 2022-07-13 DIAGNOSIS — I890 Lymphedema, not elsewhere classified: Secondary | ICD-10-CM

## 2022-07-13 NOTE — Progress Notes (Signed)
  Subjective:  Patient ID: Jessica Yates, female    DOB: 10/11/1950,  MRN: [id number]  Jessica Yates presents to clinic today for preventative diabetic foot care and painful elongated mycotic toenails 1-5 bilaterally which are tender when wearing enclosed shoe gear. Pain is relieved with periodic professional debridement. She has h/o lymphedema and has a pump at home. She states she has not used it consistently.  Chief Complaint  Patient presents with   Nail Problem    DFC BG - 124  A1C - pt does not recall PCP - Dr Criss Rosales    She has h/o lymphedema b/l LE. Patient states she might have bumped her left leg while taking out the trash. She has scheduled an appointment with Dr. Scot Dock for evaluation on Thursday.  PCP is Lucianne Lei, MD.  No Known Allergies  Review of Systems: Negative except as noted in the HPI.  Objective:  MALAYAH Yates is a pleasant 71 y.o. female obese in NAD. AAO x 3.  Neurovascular Examination: Capillary fill time to digits <3 seconds b/l lower extremities. Faintly palpable DP pulse(s) b/l lower extremities. Nonpalpable PT pulse(s) b/l lower extremities. Pedal hair absent. Lower extremity skin temperature gradient within normal limits. No pain with calf compression b/l. Lymphedema present b/l lower extremities.  Protective sensation intact 5/5 intact bilaterally with 10g monofilament b/l.  Dermatological:  No open wounds b/l lower extremities. No interdigital macerations b/l lower extremities. Toenails 1-5 b/l elongated, discolored, dystrophic, thickened, crumbly with subungual debris and tenderness to dorsal palpation.  Hyperpigmentation consistent with findings of chronic venous insufficiency is present b/l lower extremities. Skin b/l lower extremities noted to be thickened and brawny consistent with lymphedema.   Resolved tinea pedis b/l feet.  Musculoskeletal:  Normal muscle strength 5/5 to all lower extremity muscle groups bilaterally. Pes  planus deformity noted b/l lower extremities. Utilizes cane for ambulation assistance.  Assessment/Plan: 1. Pain due to onychomycosis of toenails of both feet   2. Diabetes mellitus due to underlying condition with stage 3 chronic kidney disease, with long-term current use of insulin, unspecified whether stage 3a or 3b CKD ([place])     No orders of the defined types were placed in this encounter.   -Consent given for treatment as described below: -Examined patient. -Continue supportive shoe gear daily. -Mycotic toenails 1-5 bilaterally were debrided in length and girth with sterile nail nippers and dremel without incident. -Patient/POA to call should there be question/concern in the interim.   Return in about 3 months (around 10/13/2022).  Marzetta Board, DPM

## 2022-07-13 NOTE — Telephone Encounter (Signed)
Pt called requesting an appt d/t LLE drainage for the past couple of days.  Reviewed pt's chart, returned call for clarification, two identifiers used. Pt was due for her 1 year f/u. Pt states she is covering draining areas with gauze. Asked if she should wear her compressions and she was advised to do so, also keeping her weeping areas covered. Appts scheduled per recall & symptoms. Confirmed understanding.

## 2022-07-15 ENCOUNTER — Ambulatory Visit: Payer: Medicare PPO | Admitting: Vascular Surgery

## 2022-07-15 ENCOUNTER — Encounter: Payer: Self-pay | Admitting: Vascular Surgery

## 2022-07-15 ENCOUNTER — Ambulatory Visit (HOSPITAL_COMMUNITY)
Admission: RE | Admit: 2022-07-15 | Discharge: 2022-07-15 | Disposition: A | Discharge status: Home / Self Care | Payer: Medicare PPO | Source: Ambulatory Visit | Attending: Vascular Surgery | Admitting: Vascular Surgery

## 2022-07-15 VITALS — BP 123/73 | HR 76 | Temp 97.9°F | Ht 67.0 in | Wt 223.6 lb

## 2022-07-15 DIAGNOSIS — I890 Lymphedema, not elsewhere classified: Secondary | ICD-10-CM | POA: Diagnosis not present

## 2022-07-15 DIAGNOSIS — I872 Venous insufficiency (chronic) (peripheral): Secondary | ICD-10-CM | POA: Diagnosis not present

## 2022-07-15 NOTE — Progress Notes (Signed)
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  MEDICAL ISSUES:   COMBINED CHRONIC VENOUS INSUFFICIENCY AND LYMPHEDEMA: I think her leg swelling on both sides is actually improved significantly over the last year.  However she still has significant leg swelling.  I think this is predominantly secondary to lymphedema.  Her noninvasive study today does show some deep venous reflux in a small amount of superficial venous reflux but nothing amenable to laser angioplasty.  We have again discussed all the measures she can take to help control her swelling.  We discussed importance of leg elevation and the proper positioning for this.  I have encouraged her to continue to use her compression stockings.  I have encouraged her to continue to use her lymphedema pump 2 hours a day if possible.  She should avoid prolonged sitting and standing.  We have discussed importance of exercise specifically walking and water aerobics.  We also discussed the importance of maintaining a healthy weight.  She would like to be seen back in 1 year and so I will see her in September before I retire.    HPI:   Jessica Yates is a pleasant 71 y.o. female who I last saw on 07/30/2021.  She has combined chronic venous insufficiency and lymphedema.  The swelling in her legs at that time had been fairly stable but was significant.  She does have a lymphedema pump but at that time was not using it.  I encouraged her to use it 2 hours a day.  Likewise she had not been elevating her legs as much as she could.  Based on her previous noninvasive study she was not a candidate for laser ablation.  She comes in for a 1 year follow-up visit.  Since I saw her last, she has been elevating her legs more and wearing her compression stockings.  She does use her lymphedema pump some.  There have been no significant changes in her medical history.  She remains active.  Past Medical History:  Diagnosis  Date   Allergic rhinitis    Anemia 2002   Anxiety    Arthritis    Asthma    Blood clot in vein    Cellulitis    Cellulitis of right lower extremity    Chronic diastolic (congestive) heart failure (HCC)    Chronic kidney disease, stage 3 (HCC)    Coagulation defect (HCC)    Cough    Depression 02/06/2008   Diabetes mellitus, type 2 (HCC)    DVT (deep venous thrombosis) ([place]) 2002   left leg   Dysphagia    Elevated hemoglobin A1c    GERD (gastroesophageal reflux disease)    Glaucoma    Holosystolic murmur    Kidney cysts    Leukocytosis    Liver cyst    Lymphedema    Morbid obesity ([place])    Overactive bladder    Palpitations    Peripheral venous insufficiency    Primary open angle glaucoma (POAG) of both eyes, mild stage    Seasonal allergies    Sickle cell trait ([place])    Tortuous aorta (HCC)    Unstable gait    Upper airway cough syndrome    Vitamin D deficiency     Family History  Problem Relation Age of Onset   Allergies Mother    Asthma Mother    Other Mother        enlarged heart   Arthritis Mother  Breast cancer Mother    Diabetes Mother    Hypertension Mother    Cancer Mother        breast   Hyperlipidemia Mother    Heart disease Maternal Grandmother    Breast cancer Maternal Grandmother    Birth defects Maternal Grandmother        breast   Cancer Father 107       leukemia   Allergies Sister    Arthritis Sister     SOCIAL HISTORY: Social History   Tobacco Use   Smoking status: Never   Smokeless tobacco: Never  Substance Use Topics   Alcohol use: No    No Known Allergies  Current Outpatient Medications  Medication Sig Dispense Refill   acetaminophen (TYLENOL) 500 MG tablet Take 1,000 mg by mouth every 6 (six) hours as needed for mild pain.      albuterol (VENTOLIN HFA) 108 (90 Base) MCG/ACT inhaler Inhale 2 puffs into the lungs every 6 (six) hours as needed for wheezing or shortness of breath. 8 g 6   aspirin 81 MG chewable tablet Chew  1 tablet every day by oral route.     benzonatate (TESSALON) 200 MG capsule Take 1 capsule (200 mg total) by mouth 3 (three) times daily as needed. 45 capsule 1   blood glucose meter kit and supplies KIT Dispense based on patient and insurance preference. Use up to four times daily as directed. (FOR ICD-9 250.00, 250.01). 1 each 0   cetirizine (ZYRTEC) 10 MG tablet Take 1 tablet (10 mg total) by mouth daily. 30 tablet 5   Cholecalciferol 25 MCG (1000 UT) capsule TAKE 1 CAPSULE (1,000 UNITS TOTAL) BY MOUTH DAILY.     dapagliflozin propanediol (FARXIGA) 10 MG TABS tablet Take 10 mg by mouth daily.     famotidine (PEPCID) 40 MG tablet TAKE 1 TABLET BY MOUTH EVERYDAY AT BEDTIME 90 tablet 3   fluticasone (FLONASE) 50 MCG/ACT nasal spray Place 2 sprays into both nostrils daily. 1 g 5   furosemide (LASIX) 40 MG tablet Take 1 tablet by mouth twice a day X's 3 days then go down to 1 tablet by mouth daily 90 tablet 2   ipratropium (ATROVENT) 0.03 % nasal spray      ketoconazole (NIZORAL) 2 % cream Apply to both feet and between toes once daily for 6 weeks. 60 g 1   losartan (COZAAR) 25 MG tablet Take 12.5 mg by mouth daily.     RABEprazole (ACIPHEX) 20 MG tablet TAKE 1 TABLET (20 MG TOTAL) BY MOUTH 2 (TWO) TIMES DAILY. TAKE ONE TABLET BEFORE BREAKFAST, AND ONE TABLET BEFORE DINNER 180 tablet 1   rosuvastatin (CRESTOR) 40 MG tablet Take 1 tablet (40 mg total) by mouth daily. 90 tablet 3   timolol (TIMOPTIC) 0.5 % ophthalmic solution 1 drop 2 (two) times daily.     TRAVATAN Z 0.004 % SOLN ophthalmic solution Place 1 drop into both eyes at bedtime.      No current facility-administered medications for this visit.    REVIEW OF SYSTEMS:  _0  denotes positive finding, _1  denotes negative finding Cardiac  Comments:  Chest pain or chest pressure:    Shortness of breath upon exertion:    Short of breath when lying flat:    Irregular heart rhythm:        Vascular    Pain in calf, thigh, or hip brought on  by ambulation:    Pain in feet at night that wakes you  up from your sleep:     Blood clot in your veins:    Leg swelling:  x       Pulmonary    Oxygen at home:    Productive cough:     Wheezing:         Neurologic    Sudden weakness in arms or legs:     Sudden numbness in arms or legs:     Sudden onset of difficulty speaking or slurred speech:    Temporary loss of vision in one eye:     Problems with dizziness:         Gastrointestinal    Blood in stool:     Vomited blood:         Genitourinary    Burning when urinating:     Blood in urine:        Psychiatric    Major depression:         Hematologic    Bleeding problems:    Problems with blood clotting too easily:        Skin    Rashes or ulcers:        Constitutional    Fever or chills:     PHYSICAL EXAM:   Vitals:   07/15/22 1025  BP: 123/73  Pulse: 76  Temp: 97.9 F (36.6 C)  TempSrc: Temporal  SpO2: 96%  Weight: 223 lb 9.6 oz (101.4 kg)  Height: _0  (1.702 m)    GENERAL: The patient is a well-nourished female, in no acute distress. The vital signs are documented above. CARDIAC: There is a regular rate and rhythm.  VASCULAR: I do not detect carotid bruits. She has significant swelling bilaterally more significantly on the left side as document in the photograph below.  She has peau d'orange and some hyperpigmentation.  Because of the leg swelling, I cannot palpate pedal pulses.  However she has a biphasic dorsalis pedis and posterior tibial signal bilaterally with the Doppler. PULMONARY: There is good air exchange bilaterally without wheezing or rales. ABDOMEN: Soft and non-tender with normal pitched bowel sounds.  MUSCULOSKELETAL: There are no major deformities or cyanosis. NEUROLOGIC: No focal weakness or paresthesias are detected. SKIN: There are no ulcers or rashes noted. PSYCHIATRIC: The patient has a normal affect.  DATA:    VENOUS DUPLEX: I have independently interpreted her venous duplex  scan today.  This was of the left lower extremity only.  There was no evidence of DVT.  There was deep venous reflux in the common femoral vein.  There was minimal superficial venous reflux.  There was some reflux in the great saphenous vein where it was outside the fascia in the distal thigh for a short segment.  There was some reflux in the small saphenous vein in the proximal calf only.  Maximum diameter of the vein here was 4.1 mm.  The results of this study are summarized in the diagram below.    Deitra Mayo [facility] [phone]

## 2022-07-18 ENCOUNTER — Encounter: Payer: Self-pay | Admitting: Podiatry

## 2022-07-20 ENCOUNTER — Other Ambulatory Visit: Payer: Self-pay

## 2022-07-20 DIAGNOSIS — I872 Venous insufficiency (chronic) (peripheral): Secondary | ICD-10-CM

## 2022-08-02 DIAGNOSIS — E1169 Type 2 diabetes mellitus with other specified complication: Secondary | ICD-10-CM | POA: Diagnosis not present

## 2022-08-02 DIAGNOSIS — Z7189 Other specified counseling: Secondary | ICD-10-CM | POA: Diagnosis not present

## 2022-08-17 DIAGNOSIS — M170 Bilateral primary osteoarthritis of knee: Secondary | ICD-10-CM | POA: Diagnosis not present

## 2022-09-07 ENCOUNTER — Other Ambulatory Visit: Payer: Self-pay | Admitting: Physician Assistant

## 2022-09-09 DIAGNOSIS — I10 Essential (primary) hypertension: Secondary | ICD-10-CM | POA: Diagnosis not present

## 2022-09-09 DIAGNOSIS — E669 Obesity, unspecified: Secondary | ICD-10-CM | POA: Diagnosis not present

## 2022-09-09 DIAGNOSIS — E782 Mixed hyperlipidemia: Secondary | ICD-10-CM | POA: Diagnosis not present

## 2022-09-09 DIAGNOSIS — E1169 Type 2 diabetes mellitus with other specified complication: Secondary | ICD-10-CM | POA: Diagnosis not present

## 2022-09-09 DIAGNOSIS — I890 Lymphedema, not elsewhere classified: Secondary | ICD-10-CM | POA: Diagnosis not present

## 2022-09-30 DIAGNOSIS — I10 Essential (primary) hypertension: Secondary | ICD-10-CM | POA: Diagnosis not present

## 2022-09-30 DIAGNOSIS — E1169 Type 2 diabetes mellitus with other specified complication: Secondary | ICD-10-CM | POA: Diagnosis not present

## 2022-10-10 ENCOUNTER — Other Ambulatory Visit: Payer: Self-pay | Admitting: Physician Assistant

## 2022-10-12 DIAGNOSIS — H1132 Conjunctival hemorrhage, left eye: Secondary | ICD-10-CM | POA: Diagnosis not present

## 2022-10-29 DIAGNOSIS — E1169 Type 2 diabetes mellitus with other specified complication: Secondary | ICD-10-CM | POA: Diagnosis not present

## 2022-11-09 ENCOUNTER — Ambulatory Visit: Payer: No Typology Code available for payment source | Admitting: Podiatry

## 2022-11-09 VITALS — BP 113/75

## 2022-11-09 DIAGNOSIS — M2141 Flat foot [pes planus] (acquired), right foot: Secondary | ICD-10-CM

## 2022-11-09 DIAGNOSIS — N183 Chronic kidney disease, stage 3 unspecified: Secondary | ICD-10-CM | POA: Insufficient documentation

## 2022-11-09 DIAGNOSIS — B351 Tinea unguium: Secondary | ICD-10-CM | POA: Diagnosis not present

## 2022-11-09 DIAGNOSIS — M79675 Pain in left toe(s): Secondary | ICD-10-CM

## 2022-11-09 DIAGNOSIS — M2012 Hallux valgus (acquired), left foot: Secondary | ICD-10-CM | POA: Diagnosis not present

## 2022-11-09 DIAGNOSIS — M2142 Flat foot [pes planus] (acquired), left foot: Secondary | ICD-10-CM | POA: Diagnosis not present

## 2022-11-09 DIAGNOSIS — M2011 Hallux valgus (acquired), right foot: Secondary | ICD-10-CM

## 2022-11-09 DIAGNOSIS — Z794 Long term (current) use of insulin: Secondary | ICD-10-CM

## 2022-11-09 DIAGNOSIS — E0822 Diabetes mellitus due to underlying condition with diabetic chronic kidney disease: Secondary | ICD-10-CM | POA: Diagnosis not present

## 2022-11-09 DIAGNOSIS — M79674 Pain in right toe(s): Secondary | ICD-10-CM

## 2022-11-09 DIAGNOSIS — E119 Type 2 diabetes mellitus without complications: Secondary | ICD-10-CM

## 2022-11-09 NOTE — Progress Notes (Unsigned)
ANNUAL DIABETIC FOOT EXAM  Subjective: Jessica Yates presents today 72 {jgcomplaint:23593}.  Chief Complaint  Patient presents with   Nail Problem    DFC BS-139 A1C-6.8 PCP-Bland, Veita PCP VST- 2 months ago      Patient confirms h/o diabetes.  Patient relates 72 year h/o diabetes.  Patient denies any h/o foot wounds.  Patient has h/o foot ulcer of {jgPodToeLocator:23637}, which healed via help of ***.  Patient has h/o amputation(s): {jgamp:23617}.  Patient endorses symptoms of foot numbness.   Patient endorses symptoms of foot tingling.  Patient endorses symptoms of burning in feet.  Patient endorses symptoms of pins/needles sensation in feet.  Patient denies any numbness, tingling, burning, or pins/needle sensation in feet.  Patient has been diagnosed with neuropathy and it is managed with {JGNEUROPATHYMEDS:27053}.  Risk factors: {jgriskfactors:24044}.  Lucianne Lei, MD is patient's PCP. Last visit was {Time; dates multiple:15870}***.  Past Medical History:  Diagnosis Date   Allergic rhinitis    Anemia 2002   Anxiety    Arthritis    Asthma    Blood clot in vein    Cellulitis    Cellulitis of right lower extremity    Chronic diastolic (congestive) heart failure (HCC)    Chronic kidney disease, stage 3 (HCC)    Coagulation defect (HCC)    Cough    Depression 02/06/2008   Diabetes mellitus, type 2 (HCC)    DVT (deep venous thrombosis) ([place]) 2002   left leg   Dysphagia    Elevated hemoglobin A1c    GERD (gastroesophageal reflux disease)    Glaucoma    Holosystolic murmur    Kidney cysts    Leukocytosis    Liver cyst    Lymphedema    Morbid obesity ([place])    Overactive bladder    Palpitations    Peripheral venous insufficiency    Primary open angle glaucoma (POAG) of both eyes, mild stage    Seasonal allergies    Sickle cell trait ([place])    Tortuous aorta ([place])    Unstable gait    Upper airway cough syndrome    Vitamin D  deficiency    Patient Active Problem List   Diagnosis Date Noted   Diabetes mellitus due to underlying condition with stage 3 chronic kidney disease, with long-term current use of insulin ([place]) 11/09/2022   Pain due to onychomycosis of toenails of both feet 11/09/2022   Dental caries 07/02/2022   Osteoarthritis of left knee 04/25/2020   Cellulitis 05/13/2019   Cellulitis of right lower extremity 05/12/2019   CKD (chronic kidney disease) stage 3, GFR 30-59 ml/min (HCC) 05/12/2019   Normochromic normocytic anemia 05/12/2019   Cellulitis, leg 05/12/2019   Impacted cerumen of left ear 02/22/2019   Laryngopharyngeal reflux (LPR) 02/22/2019   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   Pain in left knee 11/13/2018   Pain in right knee 11/13/2018   Cellulitis and abscess of left leg 07/22/2018   Primary open angle glaucoma of both eyes, mild stage 05/08/2018   Cough 05/07/2016   Upper airway cough syndrome 05/07/2016   Sebaceous cyst of labia 04/19/2016   OAB (overactive bladder) 04/19/2016   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   Lymphedema 05/03/2012   CHF (congestive heart failure) ([place]) 04/16/2011   SICKLE-CELL TRAIT 08/01/2007   UNSPECIFIED GLAUCOMA 08/01/2007   DVT 08/01/2007   VENOUS INSUFFICIENCY 08/01/2007   Allergic rhinitis 08/01/2007   Extrinsic asthma 08/01/2007   Past Surgical History:  Procedure Laterality Date  COSMETIC SURGERY  1975   nasal reconstruction   TOTAL ABDOMINAL HYSTERECTOMY  07/2001   Current Outpatient Medications on File Prior to Visit  Medication Sig Dispense Refill   acetaminophen (TYLENOL) 500 MG tablet Take 1,000 mg by mouth every 6 (six) hours as needed for mild pain.      albuterol (VENTOLIN HFA) 108 (90 Base) MCG/ACT inhaler Inhale 2 puffs into the lungs every 6 (six) hours as needed for wheezing or shortness of breath. 8 g 6   aspirin 81 MG chewable tablet Chew 1 tablet every day by oral route.     benzonatate  (TESSALON) 200 MG capsule Take 1 capsule (200 mg total) by mouth 3 (three) times daily as needed. 45 capsule 1   blood glucose meter kit and supplies KIT Dispense based on patient and insurance preference. Use up to four times daily as directed. (FOR ICD-9 250.00, 250.01). 1 each 0   cetirizine (ZYRTEC) 10 MG tablet Take 1 tablet (10 mg total) by mouth daily. 30 tablet 5   Cholecalciferol 25 MCG (1000 UT) capsule TAKE 1 CAPSULE (1,000 UNITS TOTAL) BY MOUTH DAILY.     dapagliflozin propanediol (FARXIGA) 10 MG TABS tablet Take 10 mg by mouth daily.     famotidine (PEPCID) 40 MG tablet TAKE 1 TABLET BY MOUTH EVERYDAY AT BEDTIME 90 tablet 3   fluticasone (FLONASE) 50 MCG/ACT nasal spray Place 2 sprays into both nostrils daily. 1 g 5   furosemide (LASIX) 40 MG tablet Take 1 tablet by mouth twice a day X's 3 days then go down to 1 tablet by mouth daily 90 tablet 2   ipratropium (ATROVENT) 0.03 % nasal spray      ketoconazole (NIZORAL) 2 % cream Apply to both feet and between toes once daily for 6 weeks. 60 g 1   losartan (COZAAR) 25 MG tablet Take 12.5 mg by mouth daily.     RABEprazole (ACIPHEX) 20 MG tablet Take 1 tablet (20 mg total) by mouth 2 (two) times daily. Take one tablet before breakfast, and one tablet before dinner. Please call [phone] to schedule an office visit for more refills 180 tablet 0   rosuvastatin (CRESTOR) 40 MG tablet TAKE 1 TABLET BY MOUTH EVERY DAY 90 tablet 2   timolol (TIMOPTIC) 0.5 % ophthalmic solution 1 drop 2 (two) times daily.     TRAVATAN Z 0.004 % SOLN ophthalmic solution Place 1 drop into both eyes at bedtime.      No current facility-administered medications on file prior to visit.    No Known Allergies Social History   Occupational History   Occupation: retired from [employer]  Tobacco Use   Smoking status: Never   Smokeless tobacco: Never  Vaping Use   Vaping Use: Never used  Substance and Sexual Activity   Alcohol use: No   Drug  use: No   Sexual activity: Never   Family History  Problem Relation Age of Onset   Allergies Mother    Asthma Mother    Other Mother        enlarged heart   Arthritis Mother    Breast cancer Mother    Diabetes Mother    Hypertension Mother    Cancer Mother        breast   Hyperlipidemia Mother    Heart disease Maternal Grandmother    Breast cancer Maternal Grandmother    Birth defects Maternal Grandmother        breast   Cancer  Father 5       leukemia   Allergies Sister    Arthritis Sister    Immunization History  Administered Date(s) Administered   Fluad Quad(high Dose 65+) 07/02/2022   Influenza Split 07/15/2015   Influenza Whole 10/14/2010   PFIZER(Purple Top)SARS-COV-2 Vaccination 10/04/2019, 10/25/2019     Review of Systems: Negative except as noted in the HPI.   Objective: Vitals:   11/09/22 1630  BP: 113/75    RENAY SHEROUSE is a pleasant 72 y.o. female in NAD. AAO X 3.  Vascular Examination: {jgvascular:23595}  Dermatological Examination: {jgderm:23598}  Neurological Examination: {jgneuro:23601::"Protective sensation intact 5/5 intact bilaterally with 10g monofilament b/l.","Vibratory sensation intact b/l.","Proprioception intact bilaterally."}  Musculoskeletal Examination: {jgmsk:23600}  Footwear Assessment: Does the patient wear appropriate shoes? {Yes,No}. Does the patient need inserts/orthotics? {Yes,No}.  Lab Results  Component Value Date   HGBA1C 6.9 (H) 05/11/2019   No results found. ADA Risk Categorization: Low Risk :  Patient has all of the following: Intact protective sensation No prior foot ulcer  No severe deformity Pedal pulses present  High Risk  Patient has one or more of the following: Loss of protective sensation Absent pedal pulses Severe Foot deformity History of foot ulcer  Assessment: 1. Pain due to onychomycosis of toenails of both feet   2. Diabetes mellitus due to underlying condition with stage 3  chronic kidney disease, with long-term current use of insulin, unspecified whether stage 3a or 3b CKD ([place])      Plan: No orders of the defined types were placed in this encounter.   No orders of the defined types were placed in this encounter.   None  {jgplan:23602::"-Patient/POA to call should there be question/concern in the interim."} No follow-ups on file.  Marzetta Board, DPM

## 2022-11-11 ENCOUNTER — Encounter: Payer: Self-pay | Admitting: Podiatry

## 2022-11-16 DIAGNOSIS — N3281 Overactive bladder: Secondary | ICD-10-CM | POA: Diagnosis not present

## 2022-11-16 DIAGNOSIS — E782 Mixed hyperlipidemia: Secondary | ICD-10-CM | POA: Diagnosis not present

## 2022-11-16 DIAGNOSIS — I10 Essential (primary) hypertension: Secondary | ICD-10-CM | POA: Diagnosis not present

## 2022-11-16 DIAGNOSIS — E1169 Type 2 diabetes mellitus with other specified complication: Secondary | ICD-10-CM | POA: Diagnosis not present

## 2022-11-16 DIAGNOSIS — J301 Allergic rhinitis due to pollen: Secondary | ICD-10-CM | POA: Diagnosis not present

## 2022-11-16 DIAGNOSIS — I890 Lymphedema, not elsewhere classified: Secondary | ICD-10-CM | POA: Diagnosis not present

## 2022-11-16 DIAGNOSIS — E669 Obesity, unspecified: Secondary | ICD-10-CM | POA: Diagnosis not present

## 2022-11-17 ENCOUNTER — Ambulatory Visit (HOSPITAL_COMMUNITY)
Admission: EM | Admit: 2022-11-17 | Discharge: 2022-11-17 | Disposition: A | Discharge status: Home / Self Care | Payer: No Typology Code available for payment source | Attending: Family Medicine | Admitting: Family Medicine

## 2022-11-17 ENCOUNTER — Emergency Department (HOSPITAL_COMMUNITY): Payer: No Typology Code available for payment source

## 2022-11-17 ENCOUNTER — Encounter (HOSPITAL_COMMUNITY): Payer: Self-pay

## 2022-11-17 ENCOUNTER — Encounter (HOSPITAL_COMMUNITY): Payer: Self-pay | Admitting: Internal Medicine

## 2022-11-17 ENCOUNTER — Other Ambulatory Visit: Payer: Self-pay

## 2022-11-17 ENCOUNTER — Inpatient Hospital Stay (HOSPITAL_COMMUNITY)
Admission: EM | Admit: 2022-11-17 | Discharge: 2022-11-22 | DRG: 872 | Disposition: A | Discharge status: Home / Self Care | Payer: No Typology Code available for payment source | Attending: Internal Medicine | Admitting: Internal Medicine

## 2022-11-17 DIAGNOSIS — N183 Chronic kidney disease, stage 3 unspecified: Secondary | ICD-10-CM | POA: Diagnosis not present

## 2022-11-17 DIAGNOSIS — R52 Pain, unspecified: Secondary | ICD-10-CM | POA: Diagnosis not present

## 2022-11-17 DIAGNOSIS — I5032 Chronic diastolic (congestive) heart failure: Secondary | ICD-10-CM | POA: Diagnosis present

## 2022-11-17 DIAGNOSIS — F32A Depression, unspecified: Secondary | ICD-10-CM | POA: Diagnosis present

## 2022-11-17 DIAGNOSIS — R0602 Shortness of breath: Secondary | ICD-10-CM | POA: Diagnosis not present

## 2022-11-17 DIAGNOSIS — Z1152 Encounter for screening for COVID-19: Secondary | ICD-10-CM | POA: Diagnosis not present

## 2022-11-17 DIAGNOSIS — A419 Sepsis, unspecified organism: Secondary | ICD-10-CM | POA: Diagnosis present

## 2022-11-17 DIAGNOSIS — Z8249 Family history of ischemic heart disease and other diseases of the circulatory system: Secondary | ICD-10-CM

## 2022-11-17 DIAGNOSIS — I808 Phlebitis and thrombophlebitis of other sites: Secondary | ICD-10-CM | POA: Diagnosis not present

## 2022-11-17 DIAGNOSIS — L03115 Cellulitis of right lower limb: Secondary | ICD-10-CM | POA: Diagnosis not present

## 2022-11-17 DIAGNOSIS — J309 Allergic rhinitis, unspecified: Secondary | ICD-10-CM | POA: Diagnosis not present

## 2022-11-17 DIAGNOSIS — E669 Obesity, unspecified: Secondary | ICD-10-CM | POA: Diagnosis present

## 2022-11-17 DIAGNOSIS — R000 Tachycardia, unspecified: Secondary | ICD-10-CM | POA: Diagnosis not present

## 2022-11-17 DIAGNOSIS — M79661 Pain in right lower leg: Secondary | ICD-10-CM | POA: Diagnosis not present

## 2022-11-17 DIAGNOSIS — F419 Anxiety disorder, unspecified: Secondary | ICD-10-CM | POA: Diagnosis not present

## 2022-11-17 DIAGNOSIS — D631 Anemia in chronic kidney disease: Secondary | ICD-10-CM | POA: Diagnosis present

## 2022-11-17 DIAGNOSIS — R6510 Systemic inflammatory response syndrome (SIRS) of non-infectious origin without acute organ dysfunction: Secondary | ICD-10-CM | POA: Diagnosis not present

## 2022-11-17 DIAGNOSIS — E119 Type 2 diabetes mellitus without complications: Secondary | ICD-10-CM | POA: Diagnosis not present

## 2022-11-17 DIAGNOSIS — Z86718 Personal history of other venous thrombosis and embolism: Secondary | ICD-10-CM

## 2022-11-17 DIAGNOSIS — R0902 Hypoxemia: Secondary | ICD-10-CM | POA: Diagnosis not present

## 2022-11-17 DIAGNOSIS — R079 Chest pain, unspecified: Secondary | ICD-10-CM

## 2022-11-17 DIAGNOSIS — M7989 Other specified soft tissue disorders: Secondary | ICD-10-CM | POA: Diagnosis not present

## 2022-11-17 DIAGNOSIS — M1990 Unspecified osteoarthritis, unspecified site: Secondary | ICD-10-CM | POA: Diagnosis not present

## 2022-11-17 DIAGNOSIS — Z833 Family history of diabetes mellitus: Secondary | ICD-10-CM

## 2022-11-17 DIAGNOSIS — D696 Thrombocytopenia, unspecified: Secondary | ICD-10-CM | POA: Diagnosis not present

## 2022-11-17 DIAGNOSIS — T801XXA Vascular complications following infusion, transfusion and therapeutic injection, initial encounter: Secondary | ICD-10-CM | POA: Diagnosis not present

## 2022-11-17 DIAGNOSIS — E8809 Other disorders of plasma-protein metabolism, not elsewhere classified: Secondary | ICD-10-CM | POA: Diagnosis present

## 2022-11-17 DIAGNOSIS — H401131 Primary open-angle glaucoma, bilateral, mild stage: Secondary | ICD-10-CM | POA: Diagnosis not present

## 2022-11-17 DIAGNOSIS — I130 Hypertensive heart and chronic kidney disease with heart failure and stage 1 through stage 4 chronic kidney disease, or unspecified chronic kidney disease: Secondary | ICD-10-CM | POA: Diagnosis present

## 2022-11-17 DIAGNOSIS — I872 Venous insufficiency (chronic) (peripheral): Secondary | ICD-10-CM | POA: Diagnosis not present

## 2022-11-17 DIAGNOSIS — Z90710 Acquired absence of both cervix and uterus: Secondary | ICD-10-CM

## 2022-11-17 DIAGNOSIS — L538 Other specified erythematous conditions: Secondary | ICD-10-CM | POA: Diagnosis not present

## 2022-11-17 DIAGNOSIS — E785 Hyperlipidemia, unspecified: Secondary | ICD-10-CM | POA: Diagnosis present

## 2022-11-17 DIAGNOSIS — Z8261 Family history of arthritis: Secondary | ICD-10-CM

## 2022-11-17 DIAGNOSIS — E1122 Type 2 diabetes mellitus with diabetic chronic kidney disease: Secondary | ICD-10-CM | POA: Diagnosis present

## 2022-11-17 DIAGNOSIS — R059 Cough, unspecified: Secondary | ICD-10-CM | POA: Diagnosis not present

## 2022-11-17 DIAGNOSIS — Y848 Other medical procedures as the cause of abnormal reaction of the patient, or of later complication, without mention of misadventure at the time of the procedure: Secondary | ICD-10-CM | POA: Diagnosis not present

## 2022-11-17 DIAGNOSIS — Z79899 Other long term (current) drug therapy: Secondary | ICD-10-CM

## 2022-11-17 DIAGNOSIS — M79604 Pain in right leg: Secondary | ICD-10-CM | POA: Diagnosis not present

## 2022-11-17 DIAGNOSIS — D573 Sickle-cell trait: Secondary | ICD-10-CM | POA: Diagnosis present

## 2022-11-17 DIAGNOSIS — K219 Gastro-esophageal reflux disease without esophagitis: Secondary | ICD-10-CM | POA: Diagnosis present

## 2022-11-17 DIAGNOSIS — I890 Lymphedema, not elsewhere classified: Secondary | ICD-10-CM | POA: Diagnosis present

## 2022-11-17 DIAGNOSIS — E876 Hypokalemia: Secondary | ICD-10-CM | POA: Diagnosis present

## 2022-11-17 DIAGNOSIS — Z7982 Long term (current) use of aspirin: Secondary | ICD-10-CM

## 2022-11-17 DIAGNOSIS — Z6833 Body mass index (BMI) 33.0-33.9, adult: Secondary | ICD-10-CM

## 2022-11-17 DIAGNOSIS — Z6835 Body mass index (BMI) 35.0-35.9, adult: Secondary | ICD-10-CM

## 2022-11-17 DIAGNOSIS — Z0389 Encounter for observation for other suspected diseases and conditions ruled out: Secondary | ICD-10-CM | POA: Diagnosis not present

## 2022-11-17 DIAGNOSIS — Z713 Dietary counseling and surveillance: Secondary | ICD-10-CM

## 2022-11-17 DIAGNOSIS — J9811 Atelectasis: Secondary | ICD-10-CM | POA: Diagnosis not present

## 2022-11-17 LAB — CBC WITH DIFFERENTIAL/PLATELET
Abs Immature Granulocytes: 0.14 10*3/uL — ABNORMAL HIGH (ref 0.00–0.07)
Basophils Absolute: 0 10*3/uL (ref 0.0–0.1)
Basophils Relative: 0 %
Eosinophils Absolute: 0 10*3/uL (ref 0.0–0.5)
Eosinophils Relative: 0 %
HCT: 40.1 % (ref 36.0–46.0)
Hemoglobin: 12.8 g/dL (ref 12.0–15.0)
Immature Granulocytes: 1 %
Lymphocytes Relative: 3 %
Lymphs Abs: 0.4 10*3/uL — ABNORMAL LOW (ref 0.7–4.0)
MCH: 26.7 pg (ref 26.0–34.0)
MCHC: 31.9 g/dL (ref 30.0–36.0)
MCV: 83.5 fL (ref 80.0–100.0)
Monocytes Absolute: 0.5 10*3/uL (ref 0.1–1.0)
Monocytes Relative: 4 %
Neutro Abs: 11.8 10*3/uL — ABNORMAL HIGH (ref 1.7–7.7)
Neutrophils Relative %: 92 %
Platelets: 163 10*3/uL (ref 150–400)
RBC: 4.8 MIL/uL (ref 3.87–5.11)
RDW: 15 % (ref 11.5–15.5)
WBC: 12.8 10*3/uL — ABNORMAL HIGH (ref 4.0–10.5)
nRBC: 0 % (ref 0.0–0.2)

## 2022-11-17 LAB — TROPONIN I (HIGH SENSITIVITY)
Troponin I (High Sensitivity): 4 ng/L (ref <18)
Troponin I (High Sensitivity): 5 ng/L (ref <18)

## 2022-11-17 LAB — URINALYSIS, W/ REFLEX TO CULTURE (INFECTION SUSPECTED)
Bilirubin Urine: NEGATIVE
Glucose, UA: >=500 mg/dL — AB
Ketones, ur: NEGATIVE mg/dL
Leukocytes,Ua: NEGATIVE
Nitrite: POSITIVE — AB
Protein, ur: NEGATIVE mg/dL
Specific Gravity, Urine: 1.018 (ref 1.005–1.030)
pH: 7 (ref 5.0–8.0)

## 2022-11-17 LAB — LACTIC ACID, PLASMA
Lactic Acid, Venous: 1.4 mmol/L (ref 0.5–1.9)
Lactic Acid, Venous: 1.5 mmol/L (ref 0.5–1.9)

## 2022-11-17 LAB — COMPREHENSIVE METABOLIC PANEL
ALT: 15 U/L (ref 0–44)
AST: 18 U/L (ref 15–41)
Albumin: 4.1 g/dL (ref 3.5–5.0)
Alkaline Phosphatase: 76 U/L (ref 38–126)
Anion gap: 9 (ref 5–15)
BUN: 17 mg/dL (ref 8–23)
CO2: 26 mmol/L (ref 22–32)
Calcium: 10 mg/dL (ref 8.9–10.3)
Chloride: 103 mmol/L (ref 98–111)
Creatinine, Ser: 1.14 mg/dL — ABNORMAL HIGH (ref 0.44–1.00)
GFR, Estimated: 51 mL/min — ABNORMAL LOW (ref >60)
Glucose, Bld: 206 mg/dL — ABNORMAL HIGH (ref 70–99)
Potassium: 3.8 mmol/L (ref 3.5–5.1)
Sodium: 138 mmol/L (ref 135–145)
Total Bilirubin: 1 mg/dL (ref 0.3–1.2)
Total Protein: 7.9 g/dL (ref 6.5–8.1)

## 2022-11-17 LAB — RESP PANEL BY RT-PCR (RSV, FLU A&B, COVID)  RVPGX2
Influenza A by PCR: NEGATIVE
Influenza B by PCR: NEGATIVE
Resp Syncytial Virus by PCR: NEGATIVE
SARS Coronavirus 2 by RT PCR: NEGATIVE

## 2022-11-17 LAB — APTT: aPTT: 28 seconds (ref 24–36)

## 2022-11-17 LAB — PROTIME-INR
INR: 1 (ref 0.8–1.2)
Prothrombin Time: 13.1 seconds (ref 11.4–15.2)

## 2022-11-17 MED ORDER — ACETAMINOPHEN 325 MG PO TABS
650.0000 mg | ORAL_TABLET | Freq: Once | ORAL | Status: AC
  Administered 2022-11-17: 650 mg via ORAL
  Filled 2022-11-17: qty 2

## 2022-11-17 MED ORDER — ONDANSETRON HCL 4 MG/2ML IJ SOLN: 4.0000 mg | Freq: Four times a day (QID) | INTRAMUSCULAR | Status: DC | PRN

## 2022-11-17 MED ORDER — VANCOMYCIN HCL IN DEXTROSE 1-5 GM/200ML-% IV SOLN
1000.0000 mg | Freq: Once | INTRAVENOUS | Status: AC
  Administered 2022-11-17: 1000 mg via INTRAVENOUS
  Filled 2022-11-17: qty 200

## 2022-11-17 MED ORDER — ENOXAPARIN SODIUM 40 MG/0.4ML IJ SOSY
40.0000 mg | PREFILLED_SYRINGE | INTRAMUSCULAR | Status: DC
  Administered 2022-11-18 – 2022-11-21 (×4): 40 mg via SUBCUTANEOUS
  Filled 2022-11-17 (×5): qty 0.4

## 2022-11-17 MED ORDER — IOHEXOL 350 MG/ML SOLN
80.0000 mL | Freq: Once | INTRAVENOUS | Status: AC | PRN
  Administered 2022-11-17: 80 mL via INTRAVENOUS

## 2022-11-17 MED ORDER — ACETAMINOPHEN 650 MG RE SUPP: 650.0000 mg | Freq: Four times a day (QID) | RECTAL | Status: DC | PRN

## 2022-11-17 MED ORDER — MELATONIN 3 MG PO TABS: 3.0000 mg | ORAL_TABLET | Freq: Every evening | ORAL | Status: DC | PRN

## 2022-11-17 MED ORDER — SODIUM CHLORIDE 0.9 % IV BOLUS
1000.0000 mL | Freq: Once | INTRAVENOUS | Status: AC
  Administered 2022-11-17: 1000 mL via INTRAVENOUS

## 2022-11-17 MED ORDER — NALOXONE HCL 0.4 MG/ML IJ SOLN: 0.4000 mg | INTRAMUSCULAR | Status: DC | PRN

## 2022-11-17 MED ORDER — PIPERACILLIN-TAZOBACTAM 3.375 G IVPB 30 MIN
3.3750 g | Freq: Once | INTRAVENOUS | Status: AC
  Administered 2022-11-17: 3.375 g via INTRAVENOUS
  Filled 2022-11-17: qty 50

## 2022-11-17 MED ORDER — ACETAMINOPHEN 325 MG PO TABS
650.0000 mg | ORAL_TABLET | Freq: Four times a day (QID) | ORAL | Status: DC | PRN
  Administered 2022-11-18 – 2022-11-21 (×5): 650 mg via ORAL
  Filled 2022-11-17 (×5): qty 2

## 2022-11-17 MED ORDER — FENTANYL CITRATE PF 50 MCG/ML IJ SOSY: 25.0000 ug | PREFILLED_SYRINGE | INTRAMUSCULAR | Status: DC | PRN

## 2022-11-17 NOTE — ED Notes (Signed)
Patient is being discharged from the Urgent Care and sent to the Emergency Department via car . Per Dr. Mannie Stabile, patient is in need of higher level of care due to rule out DVT. Patient is aware and verbalizes understanding of plan of care.  Vitals:   11/17/22 1934  BP: 104/65  Pulse: (!) 115  Resp: 18  Temp: 98.2 F (36.8 C)  SpO2: 90%

## 2022-11-17 NOTE — ED Provider Notes (Signed)
[facility] Provider Note   CSN: [id number] Arrival date & time: 11/17/22  2039     History  Chief Complaint  Patient presents with   Leg Pain    Jessica Yates is a 72 y.o. female history of diabetes, hypertension, here presenting with leg pain.  Patient had a previous DVT and also leg cellulitis but is not on any blood thinners.  Patient states that since yesterday she noticed right leg pain and redness.  She states that today she noticed the redness spreading to her calf and thigh.  She also has subjective shortness of breath.  She went to urgent care and was sent here for cellulitis versus DVT versus PE.  The history is provided by the patient.       Home Medications Prior to Admission medications   Medication Sig Start Date End Date Taking? Authorizing Provider  acetaminophen (TYLENOL) 500 MG tablet Take 1,000 mg by mouth every 6 (six) hours as needed for mild pain.     [provider]  albuterol (VENTOLIN HFA) 108 (90 Base) MCG/ACT inhaler Inhale 2 puffs into the lungs every 6 (six) hours as needed for wheezing or shortness of breath. 12/14/21   Hunsucker, Bonna Gains, MD  aspirin 81 MG chewable tablet Chew 1 tablet every day by oral route.    [provider]  blood glucose meter kit and supplies KIT Dispense based on patient and insurance preference. Use up to four times daily as directed. (FOR ICD-9 250.00, 250.01). 05/20/19   Kayleen Memos, DO  cetirizine (ZYRTEC) 10 MG tablet Take 1 tablet (10 mg total) by mouth daily. 07/02/22   Parrett, Fonnie Mu, NP  Cholecalciferol 25 MCG (1000 UT) capsule TAKE 1 CAPSULE (1,000 UNITS TOTAL) BY MOUTH DAILY.    [provider]  dapagliflozin propanediol (FARXIGA) 10 MG TABS tablet Take 10 mg by mouth daily.    [provider]  famotidine (PEPCID) 40 MG tablet TAKE 1 TABLET BY MOUTH EVERYDAY AT BEDTIME 01/25/22   Esterwood, Amy S, PA-C  fluticasone (FLONASE) 50  MCG/ACT nasal spray Place 2 sprays into both nostrils daily. 07/02/22   Parrett, Fonnie Mu, NP  furosemide (LASIX) 40 MG tablet Take 1 tablet by mouth twice a day X's 3 days then go down to 1 tablet by mouth daily 04/26/22   Freada Bergeron, MD  ipratropium (ATROVENT) 0.03 % nasal spray     [provider]  ketoconazole (NIZORAL) 2 % cream Apply to both feet and between toes once daily for 6 weeks. 03/29/22   Marzetta Board, DPM  losartan (COZAAR) 25 MG tablet Take 12.5 mg by mouth daily. 05/04/22   [provider]  RABEprazole (ACIPHEX) 20 MG tablet Take 1 tablet (20 mg total) by mouth 2 (two) times daily. Take one tablet before breakfast, and one tablet before dinner. Please call [phone] to schedule an office visit for more refills 09/08/22   Esterwood, Amy S, PA-C  rosuvastatin (CRESTOR) 40 MG tablet TAKE 1 TABLET BY MOUTH EVERY DAY 10/11/22   Dunn, Dayna N, PA-C  timolol (TIMOPTIC) 0.5 % ophthalmic solution 1 drop 2 (two) times daily. 07/16/21   [provider]  TRAVATAN Z 0.004 % SOLN ophthalmic solution Place 1 drop into both eyes at bedtime.  03/14/11   [provider]      Allergies    Patient has no known allergies.    Review of Systems   Review  of Systems  Musculoskeletal:        R leg pain   All other systems reviewed and are negative.   Physical Exam Updated Vital Signs BP 112/70   Pulse (!) 108   Temp (!) 100.9 F (38.3 C) (Oral)   Resp 15   Ht '5\' 7"'$  (1.702 m)   Wt 98 kg   SpO2 100%   BMI 33.83 kg/m  Physical Exam Vitals and nursing note reviewed.  Constitutional:      Appearance: Normal appearance.  HENT:     Head: Normocephalic.     Nose: Nose normal.     Mouth/Throat:     Mouth: Mucous membranes are moist.  Eyes:     Extraocular Movements: Extraocular movements intact.     Pupils: Pupils are equal, round, and reactive to light.  Cardiovascular:     Rate and Rhythm: Regular rhythm. Tachycardia present.     Pulses:  Normal pulses.     Heart sounds: Normal heart sounds.  Pulmonary:     Effort: Pulmonary effort is normal.     Breath sounds: Normal breath sounds.  Abdominal:     General: Abdomen is flat.     Palpations: Abdomen is soft.  Musculoskeletal:     Cervical back: Normal range of motion and neck supple.     Comments: Cellulitis of the right calf area.  Patient has calf tenderness and also tenderness in the right thigh.  Skin:    Capillary Refill: Capillary refill takes less than 2 seconds.     Comments: Cellulitis of the right thigh  Neurological:     General: No focal deficit present.     Mental Status: She is alert and oriented to person, place, and time.  Psychiatric:        Mood and Affect: Mood normal.        Behavior: Behavior normal.     ED Results / Procedures / Treatments   Labs (all labs ordered are listed, but only abnormal results are displayed) Labs Reviewed  CULTURE, BLOOD (ROUTINE X 2)  CULTURE, BLOOD (ROUTINE X 2)  LACTIC ACID, PLASMA  LACTIC ACID, PLASMA  COMPREHENSIVE METABOLIC PANEL  CBC WITH DIFFERENTIAL/PLATELET  PROTIME-INR  APTT  URINALYSIS, W/ REFLEX TO CULTURE (INFECTION SUSPECTED)  I-STAT CHEM 8, ED  TROPONIN I (HIGH SENSITIVITY)    EKG EKG Interpretation  Date/Time:  Wednesday November 17 2022 20:59:07 EST Ventricular Rate:  104 PR Interval:  166 QRS Duration: 80 QT Interval:  320 QTC Calculation: 421 R Axis:   46 Text Interpretation: Sinus tachycardia Borderline repolarization abnormality No significant change since last tracing Confirmed by Wandra Arthurs [id number]) on 11/17/2022 9:34:57 PM  Radiology DG Chest Port 1 View  Result Date: 11/17/2022 CLINICAL DATA:  Questionable sepsis. EXAM: PORTABLE CHEST 1 VIEW COMPARISON:  Chest CT dated 03/22/2022. FINDINGS: No focal consolidation, pleural effusion, pneumothorax. Top-normal cardiac size. No acute osseous pathology. IMPRESSION: No active disease. Electronically Signed   By: Anner Crete M.D.    On: 11/17/2022 21:28    Procedures Procedures    Medications Ordered in ED Medications  acetaminophen (TYLENOL) tablet 650 mg (has no administration in time range)  vancomycin (VANCOCIN) IVPB 1000 mg/200 mL premix (has no administration in time range)  piperacillin-tazobactam (ZOSYN) IVPB 3.375 g (has no administration in time range)  sodium chloride 0.9 % bolus 1,000 mL (has no administration in time range)    ED Course/ Medical Decision Making/ A&P  Medical Decision Making LATECHIA SPERDUTO is a 72 y.o. female here presenting with right leg pain and fever and shortness of breath.  Patient is tachycardic and febrile.  She has obvious cellulitis.  Patient meets sepsis criteria.  Will get CBC CMP and lactate and cultures.  Will also get CTA chest to rule out PE.  Unfortunately there is no vascular tech to do DVT studies tonight.  11:09 PM White blood cell count is 12.8.  Patient is still tachycardic.  X-ray did not show any free air.  Patient CTA chest did not show any PE.  Patient meets SIRS criteria for cellulitis.  At this point we will admit for observation.  Problems Addressed: Cellulitis of right lower extremity: acute illness or injury SIRS (systemic inflammatory response syndrome) ([place]): acute illness or injury  Amount and/or Complexity of Data Reviewed Labs: ordered. Decision-making details documented in ED Course. Radiology: ordered and independent interpretation performed. Decision-making details documented in ED Course. ECG/medicine tests: ordered and independent interpretation performed. Decision-making details documented in ED Course.  Risk OTC drugs. Prescription drug management. Decision regarding hospitalization.    Final Clinical Impression(s) / ED Diagnoses Final diagnoses:  None    Rx / DC Orders ED Discharge Orders     None         Drenda Freeze, MD 11/17/22 2310

## 2022-11-17 NOTE — ED Triage Notes (Signed)
Pt sent here from Urgent Care to be worked up for DVT in right leg. Pt states that she noticed pain, swelling and warmth in right lower leg x 1 day. Pt also reports having a cough and chest congestion x 2 days. She is febrile in triage

## 2022-11-17 NOTE — ED Provider Triage Note (Signed)
Emergency Medicine Provider Triage Evaluation Note  Jessica Yates , a 72 y.o. female  was evaluated in triage.  Pt complains of right leg pain and swelling since today.  Patient also mentions she has had some intermittent shortness of breath with cough and cold symptoms for the past 3 to 4 days as well.  She has had a history of DVT in the past.  She reports that her leg looks like the last time she has cellulitis though.  Review of Systems  Positive:  Negative:   Physical Exam  BP 112/70   Pulse (!) 108   Temp (!) 100.9 F (38.3 C) (Oral)   Resp 15   Ht '5\' 7"'$  (1.702 m)   Wt 98 kg   SpO2 100%   BMI 33.83 kg/m  Gen:   Awake, no distress   Resp:  Normal effort  MSK:   Moves extremities without difficulty  Other:  Chronic lymphedema noted bilaterally.  Patient's right leg is warm and erythematous.  Medical Decision Making  Medically screening exam initiated at 9:00 PM.  Appropriate orders placed.  [name] was informed that the remainder of the evaluation will be completed by another provider, this initial triage assessment does not replace that evaluation, and the importance of remaining in the ED until their evaluation is complete.  Patient is febrile and tachycardic.  ED sepsis ordered.  Patient is being roomed now   Sherrell Puller, [place] 11/17/22 2102

## 2022-11-17 NOTE — Discharge Instructions (Signed)
I believe you have a cellulitis (skin infection) of your right lower leg but with associated chest tightness and an elevated heart rate I cannot rule out a blood clot in your leg or a blood clot that has gone to your lung. Therefore you need to go to the Emergency Department now for further evaluation.

## 2022-11-17 NOTE — ED Triage Notes (Signed)
Pt is here for right leg pain with swelling since today

## 2022-11-18 ENCOUNTER — Encounter (HOSPITAL_COMMUNITY): Payer: Self-pay | Admitting: Internal Medicine

## 2022-11-18 ENCOUNTER — Inpatient Hospital Stay (HOSPITAL_COMMUNITY): Payer: No Typology Code available for payment source

## 2022-11-18 DIAGNOSIS — K219 Gastro-esophageal reflux disease without esophagitis: Secondary | ICD-10-CM | POA: Diagnosis not present

## 2022-11-18 DIAGNOSIS — E119 Type 2 diabetes mellitus without complications: Secondary | ICD-10-CM

## 2022-11-18 DIAGNOSIS — M7989 Other specified soft tissue disorders: Secondary | ICD-10-CM

## 2022-11-18 DIAGNOSIS — I5032 Chronic diastolic (congestive) heart failure: Secondary | ICD-10-CM | POA: Diagnosis present

## 2022-11-18 DIAGNOSIS — J309 Allergic rhinitis, unspecified: Secondary | ICD-10-CM

## 2022-11-18 DIAGNOSIS — L03115 Cellulitis of right lower limb: Secondary | ICD-10-CM | POA: Diagnosis not present

## 2022-11-18 DIAGNOSIS — L538 Other specified erythematous conditions: Secondary | ICD-10-CM

## 2022-11-18 DIAGNOSIS — R52 Pain, unspecified: Secondary | ICD-10-CM | POA: Diagnosis not present

## 2022-11-18 DIAGNOSIS — A419 Sepsis, unspecified organism: Secondary | ICD-10-CM | POA: Diagnosis present

## 2022-11-18 HISTORY — DX: Type 2 diabetes mellitus without complications: E11.9

## 2022-11-18 LAB — CBC WITH DIFFERENTIAL/PLATELET
Abs Immature Granulocytes: 0.26 10*3/uL — ABNORMAL HIGH (ref 0.00–0.07)
Basophils Absolute: 0 10*3/uL (ref 0.0–0.1)
Basophils Relative: 0 %
Eosinophils Absolute: 0 10*3/uL (ref 0.0–0.5)
Eosinophils Relative: 0 %
HCT: 33.4 % — ABNORMAL LOW (ref 36.0–46.0)
Hemoglobin: 10.8 g/dL — ABNORMAL LOW (ref 12.0–15.0)
Immature Granulocytes: 2 %
Lymphocytes Relative: 4 %
Lymphs Abs: 0.5 10*3/uL — ABNORMAL LOW (ref 0.7–4.0)
MCH: 27 pg (ref 26.0–34.0)
MCHC: 32.3 g/dL (ref 30.0–36.0)
MCV: 83.5 fL (ref 80.0–100.0)
Monocytes Absolute: 0.4 10*3/uL (ref 0.1–1.0)
Monocytes Relative: 3 %
Neutro Abs: 12 10*3/uL — ABNORMAL HIGH (ref 1.7–7.7)
Neutrophils Relative %: 91 %
Platelets: 142 10*3/uL — ABNORMAL LOW (ref 150–400)
RBC: 4 MIL/uL (ref 3.87–5.11)
RDW: 15.2 % (ref 11.5–15.5)
WBC: 13.1 10*3/uL — ABNORMAL HIGH (ref 4.0–10.5)
nRBC: 0 % (ref 0.0–0.2)

## 2022-11-18 LAB — COMPREHENSIVE METABOLIC PANEL
ALT: 18 U/L (ref 0–44)
AST: 29 U/L (ref 15–41)
Albumin: 3.2 g/dL — ABNORMAL LOW (ref 3.5–5.0)
Alkaline Phosphatase: 59 U/L (ref 38–126)
Anion gap: 8 (ref 5–15)
BUN: 15 mg/dL (ref 8–23)
CO2: 22 mmol/L (ref 22–32)
Calcium: 9.4 mg/dL (ref 8.9–10.3)
Chloride: 108 mmol/L (ref 98–111)
Creatinine, Ser: 1.22 mg/dL — ABNORMAL HIGH (ref 0.44–1.00)
GFR, Estimated: 47 mL/min — ABNORMAL LOW (ref >60)
Glucose, Bld: 210 mg/dL — ABNORMAL HIGH (ref 70–99)
Potassium: 3.4 mmol/L — ABNORMAL LOW (ref 3.5–5.1)
Sodium: 138 mmol/L (ref 135–145)
Total Bilirubin: 0.8 mg/dL (ref 0.3–1.2)
Total Protein: 6.2 g/dL — ABNORMAL LOW (ref 6.5–8.1)

## 2022-11-18 LAB — GLUCOSE, CAPILLARY
Glucose-Capillary: 122 mg/dL — ABNORMAL HIGH (ref 70–99)
Glucose-Capillary: 160 mg/dL — ABNORMAL HIGH (ref 70–99)

## 2022-11-18 LAB — MAGNESIUM
Magnesium: 2 mg/dL (ref 1.7–2.4)
Magnesium: 2.1 mg/dL (ref 1.7–2.4)

## 2022-11-18 LAB — CBG MONITORING, ED
Glucose-Capillary: 148 mg/dL — ABNORMAL HIGH (ref 70–99)
Glucose-Capillary: 238 mg/dL — ABNORMAL HIGH (ref 70–99)

## 2022-11-18 LAB — BRAIN NATRIURETIC PEPTIDE: B Natriuretic Peptide: 155.7 pg/mL — ABNORMAL HIGH (ref 0.0–100.0)

## 2022-11-18 MED ORDER — PANTOPRAZOLE SODIUM 40 MG PO TBEC
40.0000 mg | DELAYED_RELEASE_TABLET | Freq: Every day | ORAL | Status: DC
  Administered 2022-11-18 – 2022-11-22 (×5): 40 mg via ORAL
  Filled 2022-11-18 (×5): qty 1

## 2022-11-18 MED ORDER — ROSUVASTATIN CALCIUM 20 MG PO TABS
40.0000 mg | ORAL_TABLET | Freq: Every day | ORAL | Status: DC
  Administered 2022-11-18 – 2022-11-22 (×5): 40 mg via ORAL
  Filled 2022-11-18 (×5): qty 2

## 2022-11-18 MED ORDER — FLUTICASONE PROPIONATE 50 MCG/ACT NA SUSP
2.0000 | Freq: Every day | NASAL | Status: DC
  Administered 2022-11-20 – 2022-11-22 (×3): 2 via NASAL
  Filled 2022-11-18 (×2): qty 16

## 2022-11-18 MED ORDER — FAMOTIDINE 20 MG PO TABS
40.0000 mg | ORAL_TABLET | Freq: Every day | ORAL | Status: DC
  Administered 2022-11-18 – 2022-11-21 (×4): 40 mg via ORAL
  Filled 2022-11-18 (×4): qty 2

## 2022-11-18 MED ORDER — ALBUTEROL SULFATE (2.5 MG/3ML) 0.083% IN NEBU: 3.0000 mL | INHALATION_SOLUTION | Freq: Four times a day (QID) | RESPIRATORY_TRACT | Status: DC | PRN

## 2022-11-18 MED ORDER — ASPIRIN 81 MG PO CHEW
81.0000 mg | CHEWABLE_TABLET | Freq: Every day | ORAL | Status: DC
  Administered 2022-11-18 – 2022-11-22 (×5): 81 mg via ORAL
  Filled 2022-11-18 (×6): qty 1

## 2022-11-18 MED ORDER — VANCOMYCIN HCL IN DEXTROSE 1-5 GM/200ML-% IV SOLN
1000.0000 mg | INTRAVENOUS | Status: DC
  Administered 2022-11-18 – 2022-11-19 (×2): 1000 mg via INTRAVENOUS
  Filled 2022-11-18 (×2): qty 200

## 2022-11-18 MED ORDER — INSULIN ASPART 100 UNIT/ML IJ SOLN
0.0000 [IU] | Freq: Three times a day (TID) | INTRAMUSCULAR | Status: DC
  Administered 2022-11-18: 1 [IU] via SUBCUTANEOUS
  Administered 2022-11-18: 3 [IU] via SUBCUTANEOUS
  Administered 2022-11-19: 2 [IU] via SUBCUTANEOUS
  Administered 2022-11-20: 1 [IU] via SUBCUTANEOUS
  Administered 2022-11-20 – 2022-11-22 (×5): 2 [IU] via SUBCUTANEOUS
  Filled 2022-11-18: qty 0.09

## 2022-11-18 MED ORDER — SODIUM CHLORIDE 0.9 % IV SOLN
2.0000 g | Freq: Two times a day (BID) | INTRAVENOUS | Status: DC
  Administered 2022-11-18 – 2022-11-19 (×3): 2 g via INTRAVENOUS
  Filled 2022-11-18 (×3): qty 12.5

## 2022-11-18 MED ORDER — LORATADINE 10 MG PO TABS
10.0000 mg | ORAL_TABLET | Freq: Every day | ORAL | Status: DC
  Administered 2022-11-18 – 2022-11-22 (×5): 10 mg via ORAL
  Filled 2022-11-18 (×5): qty 1

## 2022-11-18 MED ORDER — POTASSIUM CHLORIDE CRYS ER 20 MEQ PO TBCR
40.0000 meq | EXTENDED_RELEASE_TABLET | Freq: Two times a day (BID) | ORAL | Status: DC
  Administered 2022-11-18: 40 meq via ORAL
  Filled 2022-11-18: qty 2

## 2022-11-18 NOTE — Progress Notes (Deleted)
Patient discharging home.  IV and telemetry box removed and temetry box returned to nurses station.  Discharge instructions reviewed with patient and mother.  All questions answerered.  Patient verbalized understanding.  Patient escorted off unit via wheelchair by NT.

## 2022-11-18 NOTE — H&P (Signed)
History and Physical      Jessica Yates [id number] DOB: 1951/08/25 DOA: 11/17/2022  PCP: Lucianne Lei, MD  Patient coming from: home   I have personally briefly reviewed patient's old medical records in [place]  Chief Complaint: Right lower extremity erythema  HPI: Jessica Yates is a 72 y.o. female with medical history significant for chronic diastolic heart failure, type 2 diabetes mellitus, left lower extremity DVT in 2002, right lower extremity DVT in 2019, who is admitted to [facility] on 11/17/2022 with sepsis due to severe cellulitis of the right lower extremity after presenting from home to [facility] ED complaining of right lower extremity erythema.   The patient reports 2 days of progressive right lower extremity erythema at the level of the right lower extremity a few inches proximal to the right ankle, with extension proximally to the level proximal to the right knee, and associated with tenderness, increased warmth to the touch, swelling.  Denies any associated drainage, including no purulent drainage.  No associated any acute numbness, paresthesias, or acute right lower extremity weakness.  She also notes associated subjective fever in the absence of chills, full body rigors, or generalized myalgias.  No recent preceding trauma to the right lower extremity.  Denies any associated chest pain, shortness of breath, cough, rash or any other location.  Denies any associated headache, neck stiffness, abdominal pain, diarrhea, or dysuria.  Medical history notable for left lower extremity DVT in 2002.  The patient also reports a history of right lower extremity DVT 4 to 5 years ago, for which she underwent a course of anticoagulation.  She notes that she is no longer on formal anticoagulation, but rather conveys that she is on a daily baby aspirin as an outpatient.      ED Course:  Vital signs in the ED were notable for the following: Temperature max one 1.6; heart  rate 108; initial blood pressure 112/70; respiratory rate 15, oxygen saturation 100% on room air.  Labs were notable for the following: CMP notable for the following: Sodium 138, creatinine 1.14, glucose 206, liver enzymes within normal limits.  Initial lactate 1.4, with repeat value noted to be 1.5.  CBC notable for white cell count 12,800 with 90% neutrophils.  INR 1.0.  Urinalysis notable for no white blood cells and leukocyte esterase negative.  Blood cultures x 2 were collected prior to initiation of IV antibiotics.  COVID, influenza, RSV PCR all negative.  Per my interpretation, EKG in ED demonstrated the following: EKG shows sinus tachycardia with heart rate 104, normal intervals, and nonspecific T wave inversion in leads III, aVF, V3 through V5, will demonstrated no evidence of ST changes, clearness of ST elevation.  Imaging and additional notable ED work-up: Plain films of the right tib-fib showed no evidence of acute osseous abnormality, and no evidence of subcutaneous gas.  CTA chest showed no evidence of acute cardiopulmonary process, including no evidence of acute pulmonary embolism nor any evidence of infiltrative edema, effusion, or pneumothorax.  While in the ED, the following were administered: Acetaminophen 650 mg p.o. x 1, normal symptoms will intervals, IV vancomycin, Zosyn.  Subsequently, the patient was admitted for further evaluation management of presenting sepsis due to severe cellulitis of the right lower extremity.     Review of Systems: As per HPI otherwise 10 point review of systems negative.   Past Medical History:  Diagnosis Date   Allergic rhinitis    Anemia 2002   Anxiety  Arthritis    Asthma    Blood clot in vein    Cellulitis    Cellulitis of right lower extremity    Chronic diastolic (congestive) heart failure (HCC)    Chronic kidney disease, stage 3 (HCC)    Coagulation defect (HCC)    Cough    Depression 02/06/2008   Diabetes mellitus, type 2  (HCC)    DVT (deep venous thrombosis) ([place]) 2002   left leg   Dysphagia    Elevated hemoglobin A1c    GERD (gastroesophageal reflux disease)    Glaucoma    Holosystolic murmur    Kidney cysts    Leukocytosis    Liver cyst    Lymphedema    Morbid obesity ([place])    Overactive bladder    Palpitations    Peripheral venous insufficiency    Primary open angle glaucoma (POAG) of both eyes, mild stage    Seasonal allergies    Sickle cell trait ([place])    Tortuous aorta (HCC)    Unstable gait    Upper airway cough syndrome    Vitamin D deficiency     Past Surgical History:  Procedure Laterality Date   COSMETIC SURGERY  1975   nasal reconstruction   TOTAL ABDOMINAL HYSTERECTOMY  07/2001    Social History:  reports that she has never smoked. She has never used smokeless tobacco. She reports that she does not drink alcohol and does not use drugs.   No Known Allergies  Family History  Problem Relation Age of Onset   Allergies Mother    Asthma Mother    Other Mother        enlarged heart   Arthritis Mother    Breast cancer Mother    Diabetes Mother    Hypertension Mother    Cancer Mother        breast   Hyperlipidemia Mother    Heart disease Maternal Grandmother    Breast cancer Maternal Grandmother    Birth defects Maternal Grandmother        breast   Cancer Father 66       leukemia   Allergies Sister    Arthritis Sister     Family history reviewed and not pertinent    Prior to Admission medications   Medication Sig Start Date End Date Taking? Authorizing Provider  acetaminophen (TYLENOL) 500 MG tablet Take 1,000 mg by mouth every 6 (six) hours as needed for mild pain.     [provider]  albuterol (VENTOLIN HFA) 108 (90 Base) MCG/ACT inhaler Inhale 2 puffs into the lungs every 6 (six) hours as needed for wheezing or shortness of breath. 12/14/21   Hunsucker, Bonna Gains, MD  aspirin 81 MG chewable tablet Chew 1 tablet every day by oral route.    [provider]  blood glucose meter kit and supplies KIT Dispense based on patient and insurance preference. Use up to four times daily as directed. (FOR ICD-9 250.00, 250.01). 05/20/19   Kayleen Memos, DO  cetirizine (ZYRTEC) 10 MG tablet Take 1 tablet (10 mg total) by mouth daily. 07/02/22   Parrett, Fonnie Mu, NP  Cholecalciferol 25 MCG (1000 UT) capsule TAKE 1 CAPSULE (1,000 UNITS TOTAL) BY MOUTH DAILY.    [provider]  dapagliflozin propanediol (FARXIGA) 10 MG TABS tablet Take 10 mg by mouth daily.    [provider]  famotidine (PEPCID) 40 MG tablet TAKE 1 TABLET BY MOUTH EVERYDAY AT BEDTIME 01/25/22   Esterwood,  Amy S, PA-C  fluticasone (FLONASE) 50 MCG/ACT nasal spray Place 2 sprays into both nostrils daily. 07/02/22   Parrett, Fonnie Mu, NP  furosemide (LASIX) 40 MG tablet Take 1 tablet by mouth twice a day X's 3 days then go down to 1 tablet by mouth daily 04/26/22   Freada Bergeron, MD  ipratropium (ATROVENT) 0.03 % nasal spray     [provider]  ketoconazole (NIZORAL) 2 % cream Apply to both feet and between toes once daily for 6 weeks. 03/29/22   Marzetta Board, DPM  losartan (COZAAR) 25 MG tablet Take 12.5 mg by mouth daily. 05/04/22   [provider]  RABEprazole (ACIPHEX) 20 MG tablet Take 1 tablet (20 mg total) by mouth 2 (two) times daily. Take one tablet before breakfast, and one tablet before dinner. Please call [phone] to schedule an office visit for more refills 09/08/22   Esterwood, Amy S, PA-C  rosuvastatin (CRESTOR) 40 MG tablet TAKE 1 TABLET BY MOUTH EVERY DAY 10/11/22   Dunn, Dayna N, PA-C  timolol (TIMOPTIC) 0.5 % ophthalmic solution 1 drop 2 (two) times daily. 07/16/21   [provider]  TRAVATAN Z 0.004 % SOLN ophthalmic solution Place 1 drop into both eyes at bedtime.  03/14/11   [provider]     Objective    Physical Exam: Vitals:   11/17/22 2051 11/17/22 2052  BP: 112/70   Pulse: (!) 108   Resp:  15   Temp: (!) 100.9 F (38.3 C)   TempSrc: Oral   SpO2: 100%   Weight:  98 kg  Height:  '5\' 7"'$  (1.702 m)    General: appears to be stated age; alert, oriented Skin: warm, dry, right lower extremity erythema associated with increased warmth, tenderness, swelling, in the absence of associated drainage. Head:  AT/[place] Mouth:  Oral mucosa membranes appear moist, normal dentition Neck: supple; trachea midline Heart:  RRR; did not appreciate any M/R/G Lungs: CTAB, did not appreciate any wheezes, rales, or rhonchi Abdomen: + BS; soft, ND, NT Vascular: 2+ pedal pulses b/l; 2+ radial pulses b/l Extremities: no muscle wasting; right lower extremity erythema associated with tenderness, increased warmth, swelling, as further detailed above Neuro: strength and sensation intact in upper and lower extremities b/l     Labs on Admission: I have personally reviewed following labs and imaging studies  CBC: Recent Labs  Lab 11/17/22 2138  WBC 12.8*  NEUTROABS 11.8*  HGB 12.8  HCT 40.1  MCV 83.5  PLT [id number]   Basic Metabolic Panel: Recent Labs  Lab 11/17/22 2138 11/17/22 2317  NA 138  --   K 3.8  --   CL 103  --   CO2 26  --   GLUCOSE 206*  --   BUN 17  --   CREATININE 1.14*  --   CALCIUM 10.0  --   MG  --  2.0   GFR: Estimated Creatinine Clearance: 53.7 mL/min (A) (by C-G formula based on SCr of 1.14 mg/dL (H)). Liver Function Tests: Recent Labs  Lab 11/17/22 2138  AST 18  ALT 15  ALKPHOS 76  BILITOT 1.0  PROT 7.9  ALBUMIN 4.1   No results for input(s): "LIPASE", "AMYLASE" in the last 168 hours. No results for input(s): "AMMONIA" in the last 168 hours. Coagulation Profile: Recent Labs  Lab 11/17/22 2138  INR 1.0   Cardiac Enzymes: No results for input(s): "CKTOTAL", "CKMB", "CKMBINDEX", "TROPONINI" in the last 168 hours. BNP (last 3 results) No  results for input(s): "PROBNP" in the last 8760 hours. HbA1C: No results for input(s): "HGBA1C" in the last 72  hours. CBG: No results for input(s): "GLUCAP" in the last 168 hours. Lipid Profile: No results for input(s): "CHOL", "HDL", "LDLCALC", "TRIG", "CHOLHDL", "LDLDIRECT" in the last 72 hours. Thyroid Function Tests: No results for input(s): "TSH", "T4TOTAL", "FREET4", "T3FREE", "THYROIDAB" in the last 72 hours. Anemia Panel: No results for input(s): "VITAMINB12", "FOLATE", "FERRITIN", "TIBC", "IRON", "RETICCTPCT" in the last 72 hours. Urine analysis:    Component Value Date/Time   COLORURINE STRAW (A) 11/17/2022 2303   APPEARANCEUR CLEAR 11/17/2022 2303   LABSPEC 1.018 11/17/2022 2303   PHURINE 7.0 11/17/2022 2303   GLUCOSEU >=500 (A) 11/17/2022 2303   HGBUR SMALL (A) 11/17/2022 2303   BILIRUBINUR NEGATIVE 11/17/2022 2303   KETONESUR NEGATIVE 11/17/2022 2303   PROTEINUR NEGATIVE 11/17/2022 2303   UROBILINOGEN 0.2 09/10/2020 2034   NITRITE POSITIVE (A) 11/17/2022 2303   LEUKOCYTESUR NEGATIVE 11/17/2022 2303    Radiological Exams on Admission: CT Angio Chest PE W and/or Wo Contrast  Result Date: 11/17/2022 CLINICAL DATA:  Right lower extremity pain and swelling with coughing, shortness of breath and prior history of DVT. EXAM: CT ANGIOGRAPHY CHEST WITH CONTRAST TECHNIQUE: Multidetector CT imaging of the chest was performed using the standard protocol during bolus administration of intravenous contrast. Multiplanar CT image reconstructions and MIPs were obtained to evaluate the vascular anatomy. RADIATION DOSE REDUCTION: This exam was performed according to the departmental dose-optimization program which includes automated exposure control, adjustment of the mA and/or kV according to patient size and/or use of iterative reconstruction technique. CONTRAST:  17m OMNIPAQUE IOHEXOL 350 MG/ML SOLN COMPARISON:  Portable chest today, PA and lateral chest 01/01/2022, chest CT without contrast 03/22/2022, and CTA chest 12/28/2015. FINDINGS: Cardiovascular: Mild panchamber cardiomegaly. No pericardial  effusion. The pulmonary veins mildly distended but no more than previously. There is chronic prominence of the pulmonary trunk which measures 3.3 cm indicating arterial hypertension. No arterial embolic filling defect is seen. There is mild aortic atherosclerosis and tortuosity without aneurysm, stenosis or dissection. The great vessels are clear with tortuosity. There is scattered calcification LAD coronary artery only. Mediastinum/Nodes: There is no intrathoracic or axillary adenopathy. There is no thyroid mass. There is retained versus refluxed fluid with in the distal thoracic esophagus but no esophageal wall thickening no hiatal hernia. The trachea and central airways are clear. Lungs/Pleura: Chronic biapical pleural-parenchymal scarring. Rounded atelectasis again noted in the posterior basal right lower lobe. There are scattered bilateral basal linear scar-like opacities. There is no pleural effusion or pneumothorax. There is artifact from respiratory motion. No focal pneumonia or mass is seen. Upper Abdomen: No acute abnormality. Musculoskeletal: Moderate to severe thoracic kyphosis approaching 90 degrees kyphotic angle. Mild chronic wedging in the midthoracic spine is unchanged. Osteopenia and thoracic spondylosis. No acute or other significant osseous findings. No chest wall mass is seen. Review of the MIP images confirms the above findings. IMPRESSION: 1. No acute chest CT or CTA findings. 2. Cardiomegaly with mildly distended pulmonary veins but no more than previously. 3. Chronic prominence of the pulmonary trunk indicating arterial hypertension. 4. Aortic and coronary artery atherosclerosis. 5. Retained versus refluxed fluid in the distal thoracic esophagus. 6. Osteopenia and degenerative change with moderate to severe thoracic kyphosis. Aortic Atherosclerosis (ICD10-I70.0). Electronically Signed   By: KTelford NabM.D.   On: 11/17/2022 23:01   DG Tibia/Fibula Right Port  Result Date:  11/17/2022 CLINICAL DATA:  Right leg pain and  swelling, initial encounter EXAM: PORTABLE RIGHT TIBIA AND FIBULA - 2 VIEW COMPARISON:  None Available. FINDINGS: Degenerative changes of the knee joint are seen. No acute fracture or dislocation is noted. No soft tissue abnormality is seen. IMPRESSION: No acute abnormality noted. Electronically Signed   By: Inez Catalina M.D.   On: 11/17/2022 22:27   DG Chest Port 1 View  Result Date: 11/17/2022 CLINICAL DATA:  Questionable sepsis. EXAM: PORTABLE CHEST 1 VIEW COMPARISON:  Chest CT dated 03/22/2022. FINDINGS: No focal consolidation, pleural effusion, pneumothorax. Top-normal cardiac size. No acute osseous pathology. IMPRESSION: No active disease. Electronically Signed   By: Anner Crete M.D.   On: 11/17/2022 21:28      Assessment/Plan    Principal Problem:   Cellulitis of right lower extremity Active Problems:   Allergic rhinitis   GERD (gastroesophageal reflux disease)   Sepsis (HCC)   Chronic diastolic CHF (congestive heart failure) (HCC)   DM2 (diabetes mellitus, type 2) ([place])      #) sepsis due to severe cellulitis of right lower extremity: dx on basis of 2 days of progressive right lower extremity extremity erythema associated with tenderness, increased warmth to the touch, swelling with imaging in the form of plain films of the right tib-fib demonstrating demonstrate no evidence of acute osseous abnormality..  Denies any associated purulent discharge, rendering MRSA to be less likely. No evidence of crepitus on exam or evidence of subcutaneous gas on imaging to suggest necrotizing fasciitis.  No evidence of bone demineralization on imaging to suggest underlying osteomyelitis. Of note, associated extremity is found to be neurovascularly intact.   Risk factors for development of the patient's cellulitis include documentation of chronic venous insufficiency lending itself to chronic venous stasis, compounded by a documented history of  chronic right-sided systolic heart failure.  Of note, no overt report of any recent injury to the right lower extremity serving as a portal of entry.  As she does have a history of multiple prior DVTs to the lower extremities, will also pursue venous ultrasound of the right lower extremity.  SIRS criteria met via presenting objective fever, leukocytosis, tachycardia. Of note, in the absence of any evidence of end organ damage, including a non-elevated presenting LA of 1.4, pt's sepsis does not meet criteria to be considered severe in nature. Also, in the absence of LA level greater than or equal to 4.0, and in the absence of any associated hypotension refractory to IVF's, there are no indications for administration of a 30 mL/kg IVF bolus at this time.  No other source of underlying infectious process identified at this time.  Blood cultures x 2 were collected prior to initiation of IV vancomycin and Zosyn.  presentation meets criteria to be considered of severe cellulitis due to associated sepsis. Overall, in the setting of presenting severe non-purulent cellulitis, will initiate IV Vanc and Cefepime per recommendation by antibiotic stewardship committee.    Plan: Abx in form of IV vancomycin and cefepime, as above. Follow for results of blood cultures x2 collected today. repeat CBC with diff in AM. I have placed nursing communication orders asking that affected extremity be elevated to help decrease associated swelling.  Venous ultrasound of the right lower extremity..  IV fentanyl.  As needed acetaminophen for fever.  Monitor on telemetry.  Hold home losartan for now.                #) Chronic diastolic heart failure: documented history of such, with most recent echocardiogram performed December  2022, which is notable for LVEF 70 to 75%, indeterminate diastolic parameters, as well as mildly reduced right ventricular systolic function, with the latter notable in the setting of presenting  right lower extremity cellulitis. No clinical or radiographic evidence to suggest acutely decompensated heart failure at this time. home diuretic regimen reportedly consists of the following: Lasix 40 mg p.o. daily.  Overall, the patient appears relatively euvolemic at this time.  Therefore, we will hold next dose of Lasix, and reevaluate volume status in the morning in order to determine appropriate timing of resumption of home diuretic regimen.   Plan: monitor strict I's & O's and daily weights. Repeat CMP in AM. Check serum mag level.  Hold next dose of Lasix, as above.  Add on BMP.               #) Type 2 Diabetes Mellitus: documented history of such. Home insulin regimen: None. Home oral hypoglycemic agents: Dapagliflozin. presenting blood sugar: 206. Most recent A1c noted to be 6.9% in August 2020.   Plan: accuchecks QAC and HS with low dose SSI.  Add on hemoglobin A1c. hold home oral hypoglycemic agents during this hospitalization.              #) Allergic Rhinitis: documented h/o such, on scheduled intranasal Flonase and Zyrtec as outpatient.    Plan: cont home Flonase and Zyrtec.                #) GERD: documented h/o such; on Robe resolved as well as Pepcid as outpatient.   Plan: continue home PPI and H2 blocker.         DVT prophylaxis: Lovenox 40 mg SQ daily Code Status: Full code Family Communication: none Disposition Plan: Per Rounding Team Consults called: none;  Admission status: Inpatient    I SPENT GREATER THAN 75  MINUTES IN CLINICAL CARE TIME/MEDICAL DECISION-MAKING IN COMPLETING THIS ADMISSION.     [place] DO [facility] [place]   11/18/2022, 12:19 AM

## 2022-11-18 NOTE — Progress Notes (Signed)
RLE venous duplex has been completed.   Results can be found under chart review under CV PROC. 11/18/2022 9:51 AM Tanis Hensarling RVT, RDMS

## 2022-11-18 NOTE — ED Notes (Signed)
Assumed care of patient, pt lying on stretcher, awake and alert, NAD. Assisted to commode.  Awaiting bed assignment

## 2022-11-18 NOTE — Progress Notes (Signed)
PROGRESS NOTE    Jessica Yates  [id number] DOB: December 21, 1950 DOA: 11/17/2022 PCP: Lucianne Lei, MD   Brief Narrative:  Patient is a 72 year old obese African-American female with past medical history significant for but not limited to chronic diastolic CHF, diabetes mellitus type 2, left lower extremity DVT in 2002, right lower extremity DVT in 2019 as well as other comorbidities who presents to [facility] complaining of right lower extremity swelling and erythema.  She started having progressive right lower extremity erythema and noticed that it progressively got worse and was more tender to touch and had more swelling.  She had no associated drainage and no acute numbness, paresthesias.  Because of her worsening leg she presented for further evaluation and has had cellulitis in the past.  Upon admission she was found to be septic from the Right Lower Extremity.  Assessment and Plan: No notes have been filed under this hospital service. Service: Hospitalist  Sepsis 2/2 Severe Cellulitis of Right Lower Extremity -Met SIRS criteria on Admission with Fever, Leukocytosis, Tachycardia and Source of Infection with right lower extremity erythema and tenderness -DG x-ray of the leg shows no evidence of acute osseous abnormality and she does not have any acute purulent discharge -WBC Trend and Lactic Acid Trend Recent Labs  Lab 11/17/22 2138 11/17/22 2313 11/18/22 0503  WBC 12.8*  --  13.1*  LATICACIDVEN 1.4 1.5  --   -Patient did not receive the 30 mL/kg IV fluid boluses given underlying CHF Blood cultures x 2 obtained prior to initiation of broad-spectrum antibiotics of IV vancomycin and Zosyn in the ED -Now we will continue IV vancomycin and IV cefepime -Continue monitor for signs and symptoms infection follow cultures and temperature curve -Lower extremity venous duplex negative for DVT -Continue to monitor on telemetry and continue supportive care with acetaminophen for  fevers  Chronic Diastolic CHF -Most recent echocardiogram was done in December 2022 with an LVEF of 70 to 75% with indeterminate diastolic parameters as well as mildly reduced right ventricular systolic function -BNP on Admission was 155 -Currently does not appear clinically decompensated -Continue home regimen of oral Lasix 40 mg p.o. daily -Strict I's and O's and daily weights -Continue monitor for signs and symptoms of volume overload  Diabetes Mellitus Type 2 -Continue to hold her oral hypoglycemic agents including Dapagliflozin -Last hemoglobin A1c was 6.9 in August 2020 -Add sensitive NovoLog/scale insulin before meals and at bedtime -Recheck hemoglobin A1c -Continue monitor CBGs per protocol and if necessary will adjust insulin regimen as necessary  Allergic rhinitis -Continue home fluticasone and loratadine  HLD -C/w Rosuvastatin 40 mg po Daily   Hypokalemia -K+ Trend: Recent Labs  Lab 11/17/22 2138 11/18/22 0503  K 3.8 3.4*  -Replete with po Kcl 40 mEQ BID x2 -Continue to Monitor and Replete as Necessary  -Repeat CMP in the AM   Thrombocytopenia -Platelet Count Trend: Recent Labs  Lab 11/17/22 2138 11/18/22 0503  PLT 163 142*  -Continue to Monitor for S/Sx of Bleeding; No overt bleeding noted -Repeat CBC in the AM  Normocytic Anemia -Hgb/Hct Trend: Recent Labs  Lab 11/17/22 2138 11/18/22 0503  HGB 12.8 10.8*  HCT 40.1 33.4*  MCV 83.5 83.5  -Check Anemia Panel in the AM  -Continue to Monitor S/Sx of Bleeding; No overt bleeding noted -Repeat CBC in the AM  Hypoalbuminemia -Patient's Albumin Trend: Recent Labs  Lab 11/17/22 2138 11/18/22 0503  ALBUMIN 4.1 3.2*  -Continue to Monitor and Trend and repeat CMP in the AM  GERD/GI Prophylaxis -C/w PPI Pantoprazole 40 mg Daily  Obesity -Complicates overall prognosis and care -Estimated body mass index is 35.7 kg/m as calculated from the following:   Height as of this encounter: '5\' 7"'$  (1.702  m).   Weight as of this encounter: 103.4 kg.  -Weight Loss and Dietary Counseling given  DVT prophylaxis: enoxaparin (LOVENOX) injection 40 mg Start: 11/18/22 0800    Code Status: Full Code Family Communication: No family currently at bedside  Disposition Plan:  Level of care: Progressive Status is: Inpatient Remains inpatient appropriate because: Needs further clinical improvement in the right lower extremity  Consultants:  None  Procedures:  As delineated as above  Antimicrobials:  Anti-infectives (From admission, onward)    Start     Dose/Rate Route Frequency Ordered Stop   11/18/22 1200  vancomycin (VANCOCIN) IVPB 1000 mg/200 mL premix        1,000 mg 200 mL/hr over 60 Minutes Intravenous Every 24 hours 11/18/22 0006     11/18/22 1000  ceFEPIme (MAXIPIME) 2 g in sodium chloride 0.9 % 100 mL IVPB        2 g 200 mL/hr over 30 Minutes Intravenous Every 12 hours 11/18/22 0006     11/17/22 2130  vancomycin (VANCOCIN) IVPB 1000 mg/200 mL premix        1,000 mg 200 mL/hr over 60 Minutes Intravenous  Once 11/17/22 2122 11/17/22 2306   11/17/22 2130  piperacillin-tazobactam (ZOSYN) IVPB 3.375 g        3.375 g 100 mL/hr over 30 Minutes Intravenous  Once 11/17/22 2122 11/17/22 2306       Subjective: Seen and examined at bedside she is complaining of some right lower extremity pain and swelling.  States that is also warm and erythematous.  No nausea or vomiting.  Denies any lightheadedness or dizziness.  No other concerns or complaints this time.  Objective: Vitals:   11/18/22 0742 11/18/22 1158 11/18/22 1202 11/18/22 1420  BP: (!) 95/52 (!) 110/58 (!) 106/55 112/68  Pulse: 86  96 99  Resp: 19 (!) 22 19 (!) 22  Temp: 99.5 F (37.5 C)  98.5 F (36.9 C) 99.1 F (37.3 C)  TempSrc: Oral  Oral Oral  SpO2: 99% 97% 96% 98%  Weight:    103.4 kg  Height:    '5\' 7"'$  (1.702 m)    Intake/Output Summary (Last 24 hours) at 11/18/2022 1546 Last data filed at 11/18/2022 1314 Gross per  24 hour  Intake 2186.82 ml  Output --  Net 2186.82 ml   Filed Weights   11/17/22 2052 11/18/22 1420  Weight: 98 kg 103.4 kg   Examination: Physical Exam:  Constitutional: WN/WD obese African-American female in mild distress appears little uncomfortable Respiratory: Diminished to auscultation bilaterally with coarse breath sounds, no wheezing, rales, rhonchi or crackles. Normal respiratory effort and patient is not tachypenic. No accessory muscle use.  Unlabored breathing Cardiovascular: RRR, no murmurs / rubs / gallops. S1 and S2 auscultated.  Has some right lower extremity swelling and erythema Abdomen: Soft, non-tender, distended second to body habitus.  Bowel sounds positive.  GU: Deferred. Musculoskeletal: No clubbing / cyanosis of digits/nails. No joint deformity upper and lower extremities. Skin: Right lower extremity is swollen and erythematous and painful to palpate compared to the left Neurologic: CN 2-12 grossly intact with no focal deficits. Romberg sign and cerebellar reflexes not assessed.  Psychiatric: Normal judgment and insight. Alert and oriented x 3. Normal mood and appropriate affect.   Data Reviewed: I  have personally reviewed following labs and imaging studies  CBC: Recent Labs  Lab 11/17/22 2138 11/18/22 0503  WBC 12.8* 13.1*  NEUTROABS 11.8* 12.0*  HGB 12.8 10.8*  HCT 40.1 33.4*  MCV 83.5 83.5  PLT 163 [id number]*   Basic Metabolic Panel: Recent Labs  Lab 11/17/22 2138 11/17/22 2317 11/18/22 0503  NA 138  --  138  K 3.8  --  3.4*  CL 103  --  108  CO2 26  --  22  GLUCOSE 206*  --  210*  BUN 17  --  15  CREATININE 1.14*  --  1.22*  CALCIUM 10.0  --  9.4  MG  --  2.0 2.1   GFR: Estimated Creatinine Clearance: 51.5 mL/min (A) (by C-G formula based on SCr of 1.22 mg/dL (H)). Liver Function Tests: Recent Labs  Lab 11/17/22 2138 11/18/22 0503  AST 18 29  ALT 15 18  ALKPHOS 76 59  BILITOT 1.0 0.8  PROT 7.9 6.2*  ALBUMIN 4.1 3.2*   No results  for input(s): "LIPASE", "AMYLASE" in the last 168 hours. No results for input(s): "AMMONIA" in the last 168 hours. Coagulation Profile: Recent Labs  Lab 11/17/22 2138  INR 1.0   Cardiac Enzymes: No results for input(s): "CKTOTAL", "CKMB", "CKMBINDEX", "TROPONINI" in the last 168 hours. BNP (last 3 results) No results for input(s): "PROBNP" in the last 8760 hours. HbA1C: No results for input(s): "HGBA1C" in the last 72 hours. CBG: Recent Labs  Lab 11/18/22 0747 11/18/22 1201  GLUCAP 148* 238*   Lipid Profile: No results for input(s): "CHOL", "HDL", "LDLCALC", "TRIG", "CHOLHDL", "LDLDIRECT" in the last 72 hours. Thyroid Function Tests: No results for input(s): "TSH", "T4TOTAL", "FREET4", "T3FREE", "THYROIDAB" in the last 72 hours. Anemia Panel: No results for input(s): "VITAMINB12", "FOLATE", "FERRITIN", "TIBC", "IRON", "RETICCTPCT" in the last 72 hours. Sepsis Labs: Recent Labs  Lab 11/17/22 2138 11/17/22 2313  LATICACIDVEN 1.4 1.5    Recent Results (from the past 240 hour(s))  Blood Culture (routine x 2)     Status: None (Preliminary result)   Collection Time: 11/17/22  9:38 PM   Specimen: BLOOD LEFT ARM  Result Value Ref Range Status   Specimen Description   Final    BLOOD LEFT ARM Performed at [facility] [address] [place], [place] [address]    Special Requests   Final    BOTTLES DRAWN AEROBIC AND ANAEROBIC Blood Culture adequate volume Performed at [facility] [address] [place], [place] [address]    Culture   Final    NO GROWTH < 12 HOURS Performed at [facility] [address], [place], [place] [address]    Report Status PENDING  Incomplete  Blood Culture (routine x 2)     Status: None (Preliminary result)   Collection Time: 11/17/22  9:59 PM   Specimen: BLOOD  Result Value Ref Range Status   Specimen Description   Final    BLOOD BLOOD LEFT ARM Performed at [facility] [address], [place], [place] [address]    Special Requests   Final    BOTTLES DRAWN AEROBIC AND ANAEROBIC Blood Culture adequate volume Performed at [facility] [address], [place], [place] [address]    Culture   Final    NO GROWTH < 12 HOURS Performed at [facility] [address] [place], [place] [address]    Report Status PENDING  Incomplete  Resp panel by RT-PCR (RSV, Flu A&B, Covid) Anterior Nasal Swab     Status: None   Collection Time: 11/17/22  9:59 PM   Specimen: Anterior Nasal Swab  Result Value Ref Range Status   SARS Coronavirus 2 by RT PCR NEGATIVE NEGATIVE Final    Comment: (NOTE) SARS-CoV-2 target nucleic acids are NOT DETECTED.  The SARS-CoV-2 RNA is generally detectable in upper respiratory specimens during the acute phase of infection. The lowest concentration of SARS-CoV-2 viral copies this assay can detect is 138 copies/mL. A negative result does not preclude SARS-Cov-2 infection and should not be used as the sole basis for treatment or other patient management decisions. A negative result may occur with  improper specimen collection/handling, submission of specimen other than nasopharyngeal swab, presence of viral mutation(s) within the areas targeted by this assay, and inadequate number of viral copies(<138 copies/mL). A negative result must be combined with clinical observations, patient history, and epidemiological information. The expected result is Negative.  Fact Sheet for Patients:  EntrepreneurPulse.com.au  Fact Sheet for Healthcare Providers:  IncredibleEmployment.be  This test is no t yet approved or cleared by the [place] FDA and  has been authorized for detection and/or diagnosis of SARS-CoV-2 by FDA under an Emergency Use Authorization (EUA). This EUA will remain  in effect (meaning this test can be used) for the duration of the COVID-19 declaration under Section 564(b)(1) of the  Act, 21 U.S.C.section 360bbb-3(b)(1), unless the authorization is terminated  or revoked sooner.       Influenza A by PCR NEGATIVE NEGATIVE Final   Influenza B by PCR NEGATIVE NEGATIVE Final    Comment: (NOTE) The Xpert Xpress SARS-CoV-2/FLU/RSV plus assay is intended as an aid in the diagnosis of influenza from Nasopharyngeal swab specimens and should not be used as a sole basis for treatment. Nasal washings and aspirates are unacceptable for Xpert Xpress SARS-CoV-2/FLU/RSV testing.  Fact Sheet for Patients: EntrepreneurPulse.com.au  Fact Sheet for Healthcare Providers: IncredibleEmployment.be  This test is not yet approved or cleared by the [place] FDA and has been authorized for detection and/or diagnosis of SARS-CoV-2 by FDA under an Emergency Use Authorization (EUA). This EUA will remain in effect (meaning this test can be used) for the duration of the COVID-19 declaration under Section 564(b)(1) of the Act, 21 U.S.C. section 360bbb-3(b)(1), unless the authorization is terminated or revoked.     Resp Syncytial Virus by PCR NEGATIVE NEGATIVE Final    Comment: (NOTE) Fact Sheet for Patients: EntrepreneurPulse.com.au  Fact Sheet for Healthcare Providers: IncredibleEmployment.be  This test is not yet approved or cleared by the [place] FDA and has been authorized for detection and/or diagnosis of SARS-CoV-2 by FDA under an Emergency Use Authorization (EUA). This EUA will remain in effect (meaning this test can be used) for the duration of the COVID-19 declaration under Section 564(b)(1) of the Act, 21 U.S.C. section 360bbb-3(b)(1), unless the authorization is terminated or revoked.  Performed at [facility], [place] [address], [place], [place] [address]     Radiology Studies: VAS [place] LOWER EXTREMITY VENOUS (DVT)  Result Date: 11/18/2022  Lower Venous DVT Study Patient  Name:  ARYON KIBE [facility]  Date of Exam:   11/18/2022 Medical Rec #: [id number]          Accession #:    [id number] Date of Birth: October 23, 1950          Patient Gender: F Patient Age:   30 years Exam Location:  [facility]  Procedure:      VAS [place] LOWER EXTREMITY VENOUS (DVT) Referring Phys: Babs Bertin --------------------------------------------------------------------------------  Indications: Pain, Swelling, and Erythema (cellulitis).  Risk Factors: HX of DVT. Limitations: Poor ultrasound/tissue interface. Comparison Study: Previous exam (reflux) on 07/24/21 was negative for DVT Performing Technologist: Rogelia Rohrer RVT, RDMS  Examination Guidelines: A complete evaluation includes B-mode imaging, spectral Doppler, color Doppler, and power Doppler as needed of all accessible portions of each vessel. Bilateral testing is considered an integral part of a complete examination. Limited examinations for reoccurring indications may be performed as noted. The reflux portion of the exam is performed with the patient in reverse Trendelenburg.  +---------+---------------+---------+-----------+----------+-------------------+ RIGHT    CompressibilityPhasicitySpontaneityPropertiesThrombus Aging      +---------+---------------+---------+-----------+----------+-------------------+ CFV      Full           Yes      Yes                                      +---------+---------------+---------+-----------+----------+-------------------+ SFJ      Full                                                             +---------+---------------+---------+-----------+----------+-------------------+ FV Prox  Full           Yes      Yes                                      +---------+---------------+---------+-----------+----------+-------------------+ FV Mid   Full           Yes      Yes                                      +---------+---------------+---------+-----------+----------+-------------------+ FV  DistalFull           Yes      Yes                                      +---------+---------------+---------+-----------+----------+-------------------+ PFV      Full                                                             +---------+---------------+---------+-----------+----------+-------------------+ POP      Full           Yes      Yes                                      +---------+---------------+---------+-----------+----------+-------------------+ PTV      Full  Not well visualized +---------+---------------+---------+-----------+----------+-------------------+ PERO     Full                                         Not well visualized +---------+---------------+---------+-----------+----------+-------------------+   +----+---------------+---------+-----------+----------+--------------+ LEFTCompressibilityPhasicitySpontaneityPropertiesThrombus Aging +----+---------------+---------+-----------+----------+--------------+ CFV Full           Yes      Yes                                 +----+---------------+---------+-----------+----------+--------------+    Summary: RIGHT: - There is no evidence of deep vein thrombosis in the lower extremity.  - No cystic structure found in the popliteal fossa. Subcutaneous edema seen in area of calf and ankle.  LEFT: - No evidence of common femoral vein obstruction.  *See table(s) above for measurements and observations.    Preliminary    CT Angio Chest PE W and/or Wo Contrast  Result Date: 11/17/2022 CLINICAL DATA:  Right lower extremity pain and swelling with coughing, shortness of breath and prior history of DVT. EXAM: CT ANGIOGRAPHY CHEST WITH CONTRAST TECHNIQUE: Multidetector CT imaging of the chest was performed using the standard protocol during bolus administration of intravenous contrast. Multiplanar CT image reconstructions and MIPs were obtained to evaluate the vascular  anatomy. RADIATION DOSE REDUCTION: This exam was performed according to the departmental dose-optimization program which includes automated exposure control, adjustment of the mA and/or kV according to patient size and/or use of iterative reconstruction technique. CONTRAST:  93m OMNIPAQUE IOHEXOL 350 MG/ML SOLN COMPARISON:  Portable chest today, PA and lateral chest 01/01/2022, chest CT without contrast 03/22/2022, and CTA chest 12/28/2015. FINDINGS: Cardiovascular: Mild panchamber cardiomegaly. No pericardial effusion. The pulmonary veins mildly distended but no more than previously. There is chronic prominence of the pulmonary trunk which measures 3.3 cm indicating arterial hypertension. No arterial embolic filling defect is seen. There is mild aortic atherosclerosis and tortuosity without aneurysm, stenosis or dissection. The great vessels are clear with tortuosity. There is scattered calcification LAD coronary artery only. Mediastinum/Nodes: There is no intrathoracic or axillary adenopathy. There is no thyroid mass. There is retained versus refluxed fluid with in the distal thoracic esophagus but no esophageal wall thickening no hiatal hernia. The trachea and central airways are clear. Lungs/Pleura: Chronic biapical pleural-parenchymal scarring. Rounded atelectasis again noted in the posterior basal right lower lobe. There are scattered bilateral basal linear scar-like opacities. There is no pleural effusion or pneumothorax. There is artifact from respiratory motion. No focal pneumonia or mass is seen. Upper Abdomen: No acute abnormality. Musculoskeletal: Moderate to severe thoracic kyphosis approaching 90 degrees kyphotic angle. Mild chronic wedging in the midthoracic spine is unchanged. Osteopenia and thoracic spondylosis. No acute or other significant osseous findings. No chest wall mass is seen. Review of the MIP images confirms the above findings. IMPRESSION: 1. No acute chest CT or CTA findings. 2.  Cardiomegaly with mildly distended pulmonary veins but no more than previously. 3. Chronic prominence of the pulmonary trunk indicating arterial hypertension. 4. Aortic and coronary artery atherosclerosis. 5. Retained versus refluxed fluid in the distal thoracic esophagus. 6. Osteopenia and degenerative change with moderate to severe thoracic kyphosis. Aortic Atherosclerosis (ICD10-I70.0). Electronically Signed   By: KTelford NabM.D.   On: 11/17/2022 23:01   DG Tibia/Fibula Right Port  Result Date: 11/17/2022 CLINICAL DATA:  Right leg pain and  swelling, initial encounter EXAM: PORTABLE RIGHT TIBIA AND FIBULA - 2 VIEW COMPARISON:  None Available. FINDINGS: Degenerative changes of the knee joint are seen. No acute fracture or dislocation is noted. No soft tissue abnormality is seen. IMPRESSION: No acute abnormality noted. Electronically Signed   By: Inez Catalina M.D.   On: 11/17/2022 22:27   DG Chest Port 1 View  Result Date: 11/17/2022 CLINICAL DATA:  Questionable sepsis. EXAM: PORTABLE CHEST 1 VIEW COMPARISON:  Chest CT dated 03/22/2022. FINDINGS: No focal consolidation, pleural effusion, pneumothorax. Top-normal cardiac size. No acute osseous pathology. IMPRESSION: No active disease. Electronically Signed   By: Anner Crete M.D.   On: 11/17/2022 21:28    Scheduled Meds:  aspirin  81 mg Oral Daily   enoxaparin (LOVENOX) injection  40 mg Subcutaneous Q24H   famotidine  40 mg Oral QHS   fluticasone  2 spray Each Nare Daily   insulin aspart  0-9 Units Subcutaneous TID WC   loratadine  10 mg Oral Daily   pantoprazole  40 mg Oral Daily   rosuvastatin  40 mg Oral Daily   Continuous Infusions:  ceFEPime (MAXIPIME) IV Stopped (11/18/22 1314)   vancomycin 1,000 mg (11/18/22 1230)    LOS: 1 day   Raiford Noble, DO [facility] Available via Epic secure chat 7am-7pm After these hours, please refer to coverage provider listed on [email or web address] 11/18/2022, 3:46 PM

## 2022-11-18 NOTE — Progress Notes (Signed)
Pharmacy Antibiotic Note  Jessica Yates is a 72 y.o. female admitted on 11/17/2022 with history of diabetes, hypertension, here presenting with leg pain. Patient states that since yesterday she noticed right leg pain and redness. She states that today she noticed the redness spreading to her calf and thigh.  Pharmacy has been consulted to dose vancomycin and cefepime for cellulitis.  Vanc and zosyn given in ED x1  Plan: Vancomycin 1gm IV q24h (AUC 505.1, Scr 1.14) Cefepime 2gm IV q12h Follow renal function, cultures and clinical course  Height: '5\' 7"'$  (170.2 cm) Weight: 98 kg (216 lb) IBW/kg (Calculated) : 61.6  Temp (24hrs), Avg:99.6 F (37.6 C), Min:98.2 F (36.8 C), Max:100.9 F (38.3 C)  Recent Labs  Lab 11/17/22 2138 11/17/22 2313  WBC 12.8*  --   CREATININE 1.14*  --   LATICACIDVEN 1.4 1.5    Estimated Creatinine Clearance: 53.7 mL/min (A) (by C-G formula based on SCr of 1.14 mg/dL (H)).    No Known Allergies  Antimicrobials this admission: 3/6 vanc >> 3/6 zosyn x 1 3/7 cefepime >>  Dose adjustments this admission:   Microbiology results: 3/6 BCx:   Thank you for allowing pharmacy to be a part of this patient's care.  Dolly Rias RPh 11/18/2022, 12:05 AM

## 2022-11-19 DIAGNOSIS — J309 Allergic rhinitis, unspecified: Secondary | ICD-10-CM | POA: Diagnosis not present

## 2022-11-19 DIAGNOSIS — I5032 Chronic diastolic (congestive) heart failure: Secondary | ICD-10-CM | POA: Diagnosis not present

## 2022-11-19 DIAGNOSIS — L03115 Cellulitis of right lower limb: Secondary | ICD-10-CM | POA: Diagnosis not present

## 2022-11-19 DIAGNOSIS — A419 Sepsis, unspecified organism: Secondary | ICD-10-CM | POA: Diagnosis not present

## 2022-11-19 LAB — MAGNESIUM: Magnesium: 2.1 mg/dL (ref 1.7–2.4)

## 2022-11-19 LAB — COMPREHENSIVE METABOLIC PANEL
ALT: 20 U/L (ref 0–44)
AST: 21 U/L (ref 15–41)
Albumin: 3.4 g/dL — ABNORMAL LOW (ref 3.5–5.0)
Alkaline Phosphatase: 75 U/L (ref 38–126)
Anion gap: 9 (ref 5–15)
BUN: 11 mg/dL (ref 8–23)
CO2: 22 mmol/L (ref 22–32)
Calcium: 9.8 mg/dL (ref 8.9–10.3)
Chloride: 108 mmol/L (ref 98–111)
Creatinine, Ser: 0.94 mg/dL (ref 0.44–1.00)
GFR, Estimated: >60 mL/min (ref >60)
Glucose, Bld: 111 mg/dL — ABNORMAL HIGH (ref 70–99)
Potassium: 4.2 mmol/L (ref 3.5–5.1)
Sodium: 139 mmol/L (ref 135–145)
Total Bilirubin: 0.6 mg/dL (ref 0.3–1.2)
Total Protein: 6.7 g/dL (ref 6.5–8.1)

## 2022-11-19 LAB — HEMOGLOBIN A1C
Hgb A1c MFr Bld: 7.1 % — ABNORMAL HIGH (ref 4.8–5.6)
Mean Plasma Glucose: 157 mg/dL

## 2022-11-19 LAB — CBC WITH DIFFERENTIAL/PLATELET
Abs Immature Granulocytes: 0.11 10*3/uL — ABNORMAL HIGH (ref 0.00–0.07)
Basophils Absolute: 0 10*3/uL (ref 0.0–0.1)
Basophils Relative: 0 %
Eosinophils Absolute: 0.1 10*3/uL (ref 0.0–0.5)
Eosinophils Relative: 1 %
HCT: 35.6 % — ABNORMAL LOW (ref 36.0–46.0)
Hemoglobin: 11.3 g/dL — ABNORMAL LOW (ref 12.0–15.0)
Immature Granulocytes: 1 %
Lymphocytes Relative: 7 %
Lymphs Abs: 0.8 10*3/uL (ref 0.7–4.0)
MCH: 26.5 pg (ref 26.0–34.0)
MCHC: 31.7 g/dL (ref 30.0–36.0)
MCV: 83.4 fL (ref 80.0–100.0)
Monocytes Absolute: 0.7 10*3/uL (ref 0.1–1.0)
Monocytes Relative: 6 %
Neutro Abs: 9.4 10*3/uL — ABNORMAL HIGH (ref 1.7–7.7)
Neutrophils Relative %: 85 %
Platelets: 137 10*3/uL — ABNORMAL LOW (ref 150–400)
RBC: 4.27 MIL/uL (ref 3.87–5.11)
RDW: 15.4 % (ref 11.5–15.5)
WBC: 11 10*3/uL — ABNORMAL HIGH (ref 4.0–10.5)
nRBC: 0 % (ref 0.0–0.2)

## 2022-11-19 LAB — GLUCOSE, CAPILLARY
Glucose-Capillary: 110 mg/dL — ABNORMAL HIGH (ref 70–99)
Glucose-Capillary: 174 mg/dL — ABNORMAL HIGH (ref 70–99)
Glucose-Capillary: 116 mg/dL — ABNORMAL HIGH (ref 70–99)
Glucose-Capillary: 231 mg/dL — ABNORMAL HIGH (ref 70–99)

## 2022-11-19 LAB — PHOSPHORUS: Phosphorus: 1.8 mg/dL — ABNORMAL LOW (ref 2.5–4.6)

## 2022-11-19 MED ORDER — SODIUM CHLORIDE 0.9 % IV SOLN
2.0000 g | Freq: Three times a day (TID) | INTRAVENOUS | Status: DC
  Administered 2022-11-19 – 2022-11-20 (×2): 2 g via INTRAVENOUS
  Filled 2022-11-19 (×2): qty 12.5

## 2022-11-19 MED ORDER — K PHOS MONO-SOD PHOS DI & MONO 155-852-130 MG PO TABS
500.0000 mg | ORAL_TABLET | Freq: Two times a day (BID) | ORAL | Status: AC
  Administered 2022-11-19 (×2): 500 mg via ORAL
  Filled 2022-11-19 (×2): qty 2

## 2022-11-19 MED ORDER — LATANOPROST 0.005 % OP SOLN
1.0000 [drp] | Freq: Every day | OPHTHALMIC | Status: DC
  Administered 2022-11-19 – 2022-11-21 (×3): 1 [drp] via OPHTHALMIC
  Filled 2022-11-19: qty 2.5

## 2022-11-19 MED ORDER — TIMOLOL MALEATE 0.5 % OP SOLN
1.0000 [drp] | Freq: Two times a day (BID) | OPHTHALMIC | Status: DC
  Administered 2022-11-19 – 2022-11-22 (×7): 1 [drp] via OPHTHALMIC
  Filled 2022-11-19: qty 5

## 2022-11-19 NOTE — Evaluation (Signed)
Physical Therapy One Time Evaluation Patient Details Name: Jessica Yates MRN: [id number] DOB: Jan 28, 1951 Today's Date: 11/19/2022  History of Present Illness  Pt is 72 yo who presents with RLE erythema. Found to have sepsis due to severe cellulitis of the RLE. PMH includes chronic diastolic heart failure, type 2 diabetes mellitus, left lower extremity DVT in 2002, right lower extremity DVT in 2019.  Clinical Impression  Patient evaluated by Physical Therapy with no further acute PT needs identified. All education has been completed and the patient has no further questions.  Pt able to ambulate with RW short distance in hallway and has DME at home.  Pt reports she can move but just now has Right LE pain so encouraged use of UEs through RW for pain control.  Pt would benefit from mobilizing with staff during remainder of acute stay.  See below for any follow-up Physical Therapy or equipment needs. PT is signing off. Thank you for this referral.         Recommendations for follow up therapy are one component of a multi-disciplinary discharge planning process, led by the attending physician.  Recommendations may be updated based on patient status, additional functional criteria and insurance authorization.  Follow Up Recommendations No PT follow up      Assistance Recommended at Discharge PRN  Patient can return home with the following  Help with stairs or ramp for entrance    Equipment Recommendations None recommended by PT  Recommendations for Other Services       Functional Status Assessment Patient has not had a recent decline in their functional status     Precautions / Restrictions Precautions Precautions: Fall Restrictions Weight Bearing Restrictions: No      Mobility  Bed Mobility Overal bed mobility: Needs Assistance Bed Mobility: Supine to Sit     Supine to sit: HOB elevated, Supervision     General bed mobility comments: Pt self-assisting RLE over EOB with UEs.  Used rail to pull up to full EOB position. Effortful but no physical assist needed    Transfers Overall transfer level: Needs assistance Equipment used: Rolling walker (2 wheels) Transfers: Sit to/from Stand Sit to Stand: Min guard, Supervision           General transfer comment: no physical assist required    Ambulation/Gait Ambulation/Gait assistance: Min guard, Supervision Gait Distance (Feet): 40 Feet Assistive device: Rolling walker (2 wheels) Gait Pattern/deviations: Step-through pattern, Decreased stride length Gait velocity: decr     General Gait Details: encouraged more weight bearing on UEs as pt reports R LE pain, distance per pt preference  Stairs            Wheelchair Mobility    Modified Rankin (Stroke Patients Only)       Balance           Standing balance support: Bilateral upper extremity supported, During functional activity Standing balance-Leahy Scale: Poor Standing balance comment: UE support; typically uses cane but more recently walker due to right leg pain                             Pertinent Vitals/Pain Pain Assessment Pain Assessment: Faces Faces Pain Scale: Hurts little more Pain Location: RLE Pain Descriptors / Indicators: Grimacing, Guarding, Sore Pain Intervention(s): Repositioned, Monitored during session    Home Living Family/patient expects to be discharged to:: Private residence Living Arrangements: Other relatives (brother) Available Help at Discharge: Family Type of Home:  House Home Access: Stairs to enter Entrance Stairs-Rails: None Entrance Stairs-Number of Steps: 1   Home Layout: One level Home Equipment: Cane - single point;Rollator (4 wheels);Shower seat      Prior Function Prior Level of Function : Independent/Modified Independent               ADLs Comments: mod I (cane). usually drives. goes shopping, out ot eat etc     Hand Dominance   Dominant Hand: Right     Extremity/Trunk Assessment   Upper Extremity Assessment Upper Extremity Assessment: Generalized weakness;Overall WFL for tasks assessed    Lower Extremity Assessment Lower Extremity Assessment: RLE deficits/detail RLE Deficits / Details: swelling, redness - cellulitis, pt assisting with lifting R LE with UEs at this time (pt reports leg feels "heavy")       Communication   Communication: No difficulties  Cognition Arousal/Alertness: Awake/alert Behavior During Therapy: WFL for tasks assessed/performed Overall Cognitive Status: Within Functional Limits for tasks assessed                                          General Comments General comments (skin integrity, edema, etc.): Pt reports her only fall occurred recently.  She was outside carrying things in from the car.    Exercises     Assessment/Plan    PT Assessment Patient does not need any further PT services  PT Problem List         PT Treatment Interventions      PT Goals (Current goals can be found in the Care Plan section)  Acute Rehab PT Goals PT Goal Formulation: All assessment and education complete, DC therapy    Frequency       Co-evaluation PT/OT/SLP Co-Evaluation/Treatment: Yes Reason for Co-Treatment: For patient/therapist safety PT goals addressed during session: Mobility/safety with mobility OT goals addressed during session: ADL's and self-care       AM-PAC PT "6 Clicks" Mobility  Outcome Measure Help needed turning from your back to your side while in a flat bed without using bedrails?: A Little Help needed moving from lying on your back to sitting on the side of a flat bed without using bedrails?: A Little Help needed moving to and from a bed to a chair (including a wheelchair)?: A Little Help needed standing up from a chair using your arms (e.g., wheelchair or bedside chair)?: A Little Help needed to walk in hospital room?: A Little Help needed climbing 3-5 steps with a  railing? : A Little 6 Click Score: 18    End of Session   Activity Tolerance: Patient tolerated treatment well Patient left: in chair;with call bell/phone within reach;with chair alarm set Nurse Communication: Mobility status PT Visit Diagnosis: Difficulty in walking, not elsewhere classified (R26.2)    Time: [id number] PT Time Calculation (min) (ACUTE ONLY): 18 min   Charges:   PT Evaluation $PT Eval Low Complexity: 1 Low         Kati PT, DPT Physical Therapist Acute Rehabilitation Services Preferred contact method: Secure Chat Weekend Pager Only: [phone] Office: [place] 11/19/2022, 10:59 AM

## 2022-11-19 NOTE — Evaluation (Signed)
Occupational Therapy Evaluation Patient Details Name: Jessica Yates MRN: [id number] DOB: 1951-02-16 Today's Date: 11/19/2022   History of Present Illness Pt is 72 yo who presents with RLE erythema. Found to have sepsis due to severe cellulitis of the RLE. PMH includes chronic diastolic heart failure, type 2 diabetes mellitus, left lower extremity DVT in 2002, right lower extremity DVT in 2019.   Clinical Impression   Pt admitted with the above diagnoses and presents with below problem list. Pt will benefit from continued acute OT to address the below listed deficits and maximize independence with basic ADLs prior to d/c home. At baseline, pt is mod I with ADLs (cane). Pt currently needs up to min guard assist with LB ADLs, functional mobility, and toilet/functional transfers; up to min A for tub shower transfer. Pt completed household distance mobility this session. Up in recliner at end of session, feet elevated.       Recommendations for follow up therapy are one component of a multi-disciplinary discharge planning process, led by the attending physician.  Recommendations may be updated based on patient status, additional functional criteria and insurance authorization.   Follow Up Recommendations  No OT follow up     Assistance Recommended at Discharge PRN  Patient can return home with the following A little help with bathing/dressing/bathroom;Other (comment) (tub shower)    Functional Status Assessment  Patient has had a recent decline in their functional status and demonstrates the ability to make significant improvements in function in a reasonable and predictable amount of time.  Equipment Recommendations  None recommended by OT    Recommendations for Other Services       Precautions / Restrictions Precautions Precautions: Fall Restrictions Weight Bearing Restrictions: No      Mobility Bed Mobility Overal bed mobility: Needs Assistance Bed Mobility: Supine to Sit      Supine to sit: Min guard, HOB elevated     General bed mobility comments: Pt self-assisting RLE across and off the bed. Used rail to pull up to full EOB position. Effortful but no physical assist needed    Transfers Overall transfer level: Needs assistance Equipment used: Rolling walker (2 wheels) Transfers: Sit to/from Stand Sit to Stand: Min guard           General transfer comment: to/from EOB and recliner.      Balance Overall balance assessment: Needs assistance Sitting-balance support: Feet supported, Single extremity supported Sitting balance-Leahy Scale: Fair     Standing balance support: Bilateral upper extremity supported, During functional activity Standing balance-Leahy Scale: Poor Standing balance comment: rw for balance and RLE pain management                           ADL either performed or assessed with clinical judgement   ADL Overall ADL's : Needs assistance/impaired Eating/Feeding: Set up;Sitting   Grooming: Set up;Sitting   Upper Body Bathing: Set up;Sitting   Lower Body Bathing: Min guard;Sit to/from stand   Upper Body Dressing : Set up;Sitting   Lower Body Dressing: Min guard;Sit to/from stand   Toilet Transfer: Min guard;Ambulation;Rolling walker (2 wheels)   Toileting- Clothing Manipulation and Hygiene: Min guard;Sit to/from stand   Tub/ Shower Transfer: Tub transfer;Min guard;Ambulation;Minimal assistance   Functional mobility during ADLs: Min guard;Rolling walker (2 wheels) General ADL Comments: Pt completed household distance functional mobility. Up in recliner at end of session. Slow, guarded movements. no physical assist. needed BUE support for dynamic standing.  Vision         Perception     Praxis      Pertinent Vitals/Pain Pain Assessment Pain Assessment: Faces Faces Pain Scale: Hurts little more Pain Location: RLE Pain Descriptors / Indicators: Grimacing, Guarding, Sore Pain Intervention(s):  Limited activity within patient's tolerance, Monitored during session, Repositioned     Hand Dominance Right   Extremity/Trunk Assessment Upper Extremity Assessment Upper Extremity Assessment: Generalized weakness;Overall [facility] for tasks assessed   Lower Extremity Assessment Lower Extremity Assessment: Defer to PT evaluation       Communication Communication Communication: No difficulties   Cognition Arousal/Alertness: Awake/alert Behavior During Therapy: WFL for tasks assessed/performed Overall Cognitive Status: Within Functional Limits for tasks assessed                                       General Comments       Exercises     Shoulder Instructions      Home Living Family/patient expects to be discharged to:: Private residence Living Arrangements: Other relatives (brother) Available Help at Discharge: Family Type of Home: House Home Access: Stairs to enter Technical brewer of Steps: 1 Entrance Stairs-Rails: None Home Layout: One level     Bathroom Shower/Tub: Tub/shower unit         Home Equipment: Cane - single point;Rollator (4 wheels);Shower seat          Prior Functioning/Environment Prior Level of Function : Independent/Modified Independent               ADLs Comments: mod I (cane). usually drives. goes shopping, out ot eat etc        OT Problem List: Decreased activity tolerance;Impaired balance (sitting and/or standing);Decreased knowledge of use of DME or AE;Decreased knowledge of precautions;Pain;Increased edema      OT Treatment/Interventions: Self-care/ADL training;DME and/or AE instruction;Therapeutic exercise;Therapeutic activities;Patient/family education;Balance training    OT Goals(Current goals can be found in the care plan section) Acute Rehab OT Goals Patient Stated Goal: reduce pain and swelling in RLE OT Goal Formulation: With patient Potential to Achieve Goals: Good ADL Goals Pt Will Perform  Grooming: with modified independence;standing Pt Will Perform Tub/Shower Transfer: Tub transfer;with modified independence;ambulating;shower seat;rolling walker  OT Frequency: Min 2X/week    Co-evaluation PT/OT/SLP Co-Evaluation/Treatment: Yes Reason for Co-Treatment: For patient/therapist safety   OT goals addressed during session: ADL's and self-care      AM-PAC OT "6 Clicks" Daily Activity     Outcome Measure Help from another person eating meals?: None Help from another person taking care of personal grooming?: None Help from another person toileting, which includes using toliet, bedpan, or urinal?: A Little Help from another person bathing (including washing, rinsing, drying)?: A Little Help from another person to put on and taking off regular upper body clothing?: None Help from another person to put on and taking off regular lower body clothing?: A Little 6 Click Score: 21   End of Session Equipment Utilized During Treatment: Rolling walker (2 wheels) Nurse Communication: Other (comment) (NT: chair alarm needs new batteries)  Activity Tolerance: Patient tolerated treatment well;Patient limited by pain Patient left: in chair;with call bell/phone within reach  OT Visit Diagnosis: Unsteadiness on feet (R26.81);Pain Pain - Right/Left: Right Pain - part of body: Leg                Time: [id number] OT Time Calculation (min): 16 min Charges:  OT  General Charges $OT Visit: 1 Visit OT Evaluation $OT Eval Moderate Complexity: [place], OT Acute Rehabilitation Services Office: [phone]   Hortencia Pilar 11/19/2022, 10:20 AM

## 2022-11-19 NOTE — Progress Notes (Cosign Needed)
  Transition of Care [facility]) Screening Note   Patient Details  Name: Jessica Yates Date of Birth: [birth year]   Transition of Care [facility]) CM/SW Contact:    Dessa Phi, RN Phone Number: 11/19/2022, 2:05 PM    Transition of Care Department [facility]) has reviewed patient and no TOC needs have been identified at this time. We will continue to monitor patient advancement through interdisciplinary progression rounds. If new patient transition needs arise, please place a TOC consult.

## 2022-11-19 NOTE — Progress Notes (Signed)
PROGRESS NOTE    Jessica Yates  [id number] DOB: 1950-10-30 DOA: 11/17/2022 PCP: Lucianne Lei, MD   Brief Narrative:  Patient is a 72 year old obese African-American female with past medical history significant for but not limited to chronic diastolic CHF, diabetes mellitus type 2, left lower extremity DVT in 2002, right lower extremity DVT in 2019 as well as other comorbidities who presents to [facility] complaining of right lower extremity swelling and erythema.  She started having progressive right lower extremity erythema and noticed that it progressively got worse and was more tender to touch and had more swelling.  She had no associated drainage and no acute numbness, paresthesias.  Because of her worsening leg she presented for further evaluation and has had cellulitis in the past.  Upon admission she was found to be septic from the Right Lower Extremity Cellulitis.  Remains swollen but cellulitis is slowly improving and is not as erythematous or warm.  She needs to continue to elevate it.  Will continue IV vancomycin and IV cefepime for now given that she was febrile overnight again.   Assessment and Plan: No notes have been filed under this hospital service. Service: Hospitalist  Sepsis 2/2 Severe Cellulitis of Right Lower Extremity -Met SIRS criteria on Admission with Fever, Leukocytosis, Tachycardia and Source of Infection with right lower extremity erythema and tenderness[ Overnight patient was Febrile to 100.6 -DG x-ray of the leg shows no evidence of acute osseous abnormality and she does not have any acute purulent discharge -WBC Trend and Lactic Acid Trend Recent Labs  Lab 11/17/22 2138 11/17/22 2313 11/18/22 0503 11/19/22 0539  WBC 12.8*  --  13.1* 11.0*  LATICACIDVEN 1.4 1.5  --   --   -Patient did not receive the 30 mL/kg IV fluid boluses given underlying CHF Blood cultures x 2 obtained prior to initiation of broad-spectrum antibiotics of IV vancomycin and Zosyn in  the ED; Blood Cx x2 showed NGTD at 2 Days  -Now we will continue IV vancomycin and IV cefepime -Continue monitor for signs and symptoms infection follow cultures and temperature curve -Lower extremity venous duplex negative for DVT -Continue to monitor on telemetry and continue supportive care with acetaminophen for fevers -PT OT recommending no follow-up   Chronic Diastolic CHF -Most recent echocardiogram was done in December 2022 with an LVEF of 70 to 75% with indeterminate diastolic parameters as well as mildly reduced right ventricular systolic function -BNP on Admission was 155 -Currently does not appear clinically decompensated -Continue home regimen of oral Lasix 40 mg p.o. daily -Strict I's and O's and daily weights Intake/Output Summary (Last 24 hours) at 11/19/2022 0851 Last data filed at 11/19/2022 0600 Gross per 24 hour  Intake 2418.83 ml  Output 1875 ml  Net 543.83 ml  -Continue monitor for signs and symptoms of volume overload   Diabetes Mellitus Type 2 -Continue to hold her oral hypoglycemic agents including Dapagliflozin -Last hemoglobin A1c was 6.9 in August 2020 -Add sensitive NovoLog/scale insulin before meals and at bedtime -Recheck hemoglobin A1c -Continue monitor CBGs per protocol and if necessary will adjust insulin regimen as necessary -CBGs ranging from 110-238   Allergic Rhinitis -Continue home Fluticasone 2 spray Each Nare Daily and Loratadine 10 mg po Daily   HLD -C/w Rosuvastatin 40 mg po Daily    Hypokalemia -K+ Trend: Recent Labs  Lab 11/17/22 2138 11/18/22 0503 11/19/22 0539  K 3.8 3.4* 4.2  -Currently getting po K Phos Neutral 500 mg BID x2 -Continue to Monitor  and Replete as Necessary  -Repeat CMP in the AM   Hypophosphatemia -Patient's Phos Level Trend: Recent Labs  Lab 11/19/22 0539  PHOS 1.8*  -Replete with po K Phos Neutral 500 mg po BID x2 -Continue to Monitor and Trend and Repeat Phos Level in the AM    Thrombocytopenia -Platelet Count Trend: Recent Labs  Lab 11/17/22 2138 11/18/22 0503 11/19/22 0539  PLT 163 142* 137*  -Continue to Monitor for S/Sx of Bleeding; No overt bleeding noted -Repeat CBC in the AM   Normocytic Anemia -Hgb/Hct Trend: Recent Labs  Lab 11/17/22 2138 11/18/22 0503 11/19/22 0539  HGB 12.8 10.8* 11.3*  HCT 40.1 33.4* 35.6*  MCV 83.5 83.5 83.4  -Check Anemia Panel in the AM  -Continue to Monitor S/Sx of Bleeding; No overt bleeding noted -Repeat CBC in the AM   Hypoalbuminemia -Patient's Albumin Trend: Recent Labs  Lab 11/17/22 2138 11/18/22 0503 11/19/22 0539  ALBUMIN 4.1 3.2* 3.4*  -Continue to Monitor and Trend and repeat CMP in the AM   GERD/GI Prophylaxis -C/w PPI Pantoprazole 40 mg Daily   Obesity -Complicates overall prognosis and care -Estimated body mass index is 34.46 kg/m as calculated from the following:   Height as of this encounter: '5\' 7"'$  (1.702 m).   Weight as of this encounter: 99.8 kg.  -Weight Loss and Dietary Counseling given  Glaucoma  -C/w Timolol 1 drop in Both Eyes BID daily and Travoprost substitution with Latanoprost 0.005% Ophthalmic Solution 1 drop Both Eyes qHS  DVT prophylaxis: enoxaparin (LOVENOX) injection 40 mg Start: 11/18/22 0800    Code Status: Full Code Family Communication: No family present at bedside   Disposition Plan:  Level of care: Progressive Status is: Inpatient Remains inpatient appropriate because: Needs further clinical improvement anticipating discharging in the next 24 to 48 hours if she is afebrile   Consultants:  None  Procedures:  As delineated as above   Antimicrobials:  Anti-infectives (From admission, onward)    Start     Dose/Rate Route Frequency Ordered Stop   11/18/22 1200  vancomycin (VANCOCIN) IVPB 1000 mg/200 mL premix        1,000 mg 200 mL/hr over 60 Minutes Intravenous Every 24 hours 11/18/22 0006     11/18/22 1000  ceFEPIme (MAXIPIME) 2 g in sodium chloride  0.9 % 100 mL IVPB        2 g 200 mL/hr over 30 Minutes Intravenous Every 12 hours 11/18/22 0006     11/17/22 2130  vancomycin (VANCOCIN) IVPB 1000 mg/200 mL premix        1,000 mg 200 mL/hr over 60 Minutes Intravenous  Once 11/17/22 2122 11/17/22 2306   11/17/22 2130  piperacillin-tazobactam (ZOSYN) IVPB 3.375 g        3.375 g 100 mL/hr over 30 Minutes Intravenous  Once 11/17/22 2122 11/17/22 2306       Subjective: Seen and examined at bedside he was sitting in the chair and thinks her leg is nonswollen and not as erythematous but still a little painful.  Denies any chest pain or shortness of breath.  No lightheadedness or dizziness.  Feels okay.  Objective: Vitals:   11/18/22 2200 11/19/22 0153 11/19/22 0602 11/19/22 0647  BP: 120/60 120/61 116/61   Pulse: 83 86 95   Resp: '20 20 16   '$ Temp: 98.7 F (37.1 C) 98.5 F (36.9 C) (!) 100.6 F (38.1 C) 98.9 F (37.2 C)  TempSrc: Oral Oral Oral Oral  SpO2: 100% 100% 99%  Weight:    99.8 kg  Height:        Intake/Output Summary (Last 24 hours) at 11/19/2022 0850 Last data filed at 11/19/2022 0600 Gross per 24 hour  Intake 2418.83 ml  Output 1875 ml  Net 543.83 ml   Filed Weights   11/17/22 2052 11/18/22 1420 11/19/22 0647  Weight: 98 kg 103.4 kg 99.8 kg   Examination: Physical Exam:  Constitutional: WN/WD obese African-American female and no acute distress sitting in a chair Respiratory: Diminished to auscultation bilaterally with coarse breath sounds, no wheezing, rales, rhonchi or crackles. Normal respiratory effort and patient is not tachypenic. No accessory muscle use.  Unlabored breathing Cardiovascular: RRR, no murmurs / rubs / gallops. S1 and S2 auscultated.  Has some right extremity swelling and erythema still but is improving slowly Abdomen: Soft, non-tender, distended secondary to body habitus. Bowel sounds positive.  GU: Deferred. Musculoskeletal: No clubbing / cyanosis of digits/nails. No joint deformity upper and  lower extremities.  Skin: No rashes, lesions, ulcers on a limited skin evaluation. No induration; Warm and dry.  Neurologic: CN 2-12 grossly intact with no focal deficits. Romberg sign and cerebellar reflexes not assessed.  Psychiatric: Normal judgment and insight. Alert and oriented x 3. Normal mood and appropriate affect.   Data Reviewed: I have personally reviewed following labs and imaging studies  CBC: Recent Labs  Lab 11/17/22 2138 11/18/22 0503 11/19/22 0539  WBC 12.8* 13.1* 11.0*  NEUTROABS 11.8* 12.0* 9.4*  HGB 12.8 10.8* 11.3*  HCT 40.1 33.4* 35.6*  MCV 83.5 83.5 83.4  PLT 163 142* 0000000*   Basic Metabolic Panel: Recent Labs  Lab 11/17/22 2138 11/17/22 2317 11/18/22 0503 11/19/22 0539  NA 138  --  138 139  K 3.8  --  3.4* 4.2  CL 103  --  108 108  CO2 26  --  22 22  GLUCOSE 206*  --  210* 111*  BUN 17  --  15 11  CREATININE 1.14*  --  1.22* 0.94  CALCIUM 10.0  --  9.4 9.8  MG  --  2.0 2.1 2.1  PHOS  --   --   --  1.8*   GFR: Estimated Creatinine Clearance: 65.7 mL/min (by C-G formula based on SCr of 0.94 mg/dL). Liver Function Tests: Recent Labs  Lab 11/17/22 2138 11/18/22 0503 11/19/22 0539  AST '18 29 21  '$ ALT '15 18 20  '$ ALKPHOS 76 59 75  BILITOT 1.0 0.8 0.6  PROT 7.9 6.2* 6.7  ALBUMIN 4.1 3.2* 3.4*   No results for input(s): "LIPASE", "AMYLASE" in the last 168 hours. No results for input(s): "AMMONIA" in the last 168 hours. Coagulation Profile: Recent Labs  Lab 11/17/22 2138  INR 1.0   Cardiac Enzymes: No results for input(s): "CKTOTAL", "CKMB", "CKMBINDEX", "TROPONINI" in the last 168 hours. BNP (last 3 results) No results for input(s): "PROBNP" in the last 8760 hours. HbA1C: Recent Labs    11/17/22 2138  HGBA1C 7.1*   CBG: Recent Labs  Lab 11/18/22 0747 11/18/22 1201 11/18/22 1736 11/18/22 2121 11/19/22 0734  GLUCAP 148* 238* 122* 160* 110*   Lipid Profile: No results for input(s): "CHOL", "HDL", "LDLCALC", "TRIG", "CHOLHDL",  "LDLDIRECT" in the last 72 hours. Thyroid Function Tests: No results for input(s): "TSH", "T4TOTAL", "FREET4", "T3FREE", "THYROIDAB" in the last 72 hours. Anemia Panel: No results for input(s): "VITAMINB12", "FOLATE", "FERRITIN", "TIBC", "IRON", "RETICCTPCT" in the last 72 hours. Sepsis Labs: Recent Labs  Lab 11/17/22 2138 11/17/22 2313  LATICACIDVEN 1.4  1.5    Recent Results (from the past 240 hour(s))  Blood Culture (routine x 2)     Status: None (Preliminary result)   Collection Time: 11/17/22  9:38 PM   Specimen: BLOOD LEFT ARM  Result Value Ref Range Status   Specimen Description   Final    BLOOD LEFT ARM Performed at [facility], [address] [place], [place] [address]    Special Requests   Final    BOTTLES DRAWN AEROBIC AND ANAEROBIC Blood Culture adequate volume Performed at [facility] [address] [place], [place] [address]    Culture   Final    NO GROWTH 2 DAYS Performed at [facility] [address], [place], [place] [address]    Report Status PENDING  Incomplete  Blood Culture (routine x 2)     Status: None (Preliminary result)   Collection Time: 11/17/22  9:59 PM   Specimen: BLOOD  Result Value Ref Range Status   Specimen Description   Final    BLOOD BLOOD LEFT ARM Performed at [facility] [address], [place], [place] [address]    Special Requests   Final    BOTTLES DRAWN AEROBIC AND ANAEROBIC Blood Culture adequate volume Performed at [facility] [address], [place], [place] [address]    Culture   Final    NO GROWTH 2 DAYS Performed at [facility] [address], [place], [place] [address]    Report Status PENDING  Incomplete  Resp panel by RT-PCR (RSV, Flu A&B, Covid) Anterior Nasal Swab     Status: None   Collection Time: 11/17/22  9:59 PM   Specimen: Anterior Nasal Swab  Result Value Ref Range Status   SARS Coronavirus 2 by RT PCR NEGATIVE NEGATIVE  Final    Comment: (NOTE) SARS-CoV-2 target nucleic acids are NOT DETECTED.  The SARS-CoV-2 RNA is generally detectable in upper respiratory specimens during the acute phase of infection. The lowest concentration of SARS-CoV-2 viral copies this assay can detect is 138 copies/mL. A negative result does not preclude SARS-Cov-2 infection and should not be used as the sole basis for treatment or other patient management decisions. A negative result may occur with  improper specimen collection/handling, submission of specimen other than nasopharyngeal swab, presence of viral mutation(s) within the areas targeted by this assay, and inadequate number of viral copies(<138 copies/mL). A negative result must be combined with clinical observations, patient history, and epidemiological information. The expected result is Negative.  Fact Sheet for Patients:  EntrepreneurPulse.com.au  Fact Sheet for Healthcare Providers:  IncredibleEmployment.be  This test is no t yet approved or cleared by the [place] FDA and  has been authorized for detection and/or diagnosis of SARS-CoV-2 by FDA under an Emergency Use Authorization (EUA). This EUA will remain  in effect (meaning this test can be used) for the duration of the COVID-19 declaration under Section 564(b)(1) of the Act, 21 U.S.C.section 360bbb-3(b)(1), unless the authorization is terminated  or revoked sooner.       Influenza A by PCR NEGATIVE NEGATIVE Final   Influenza B by PCR NEGATIVE NEGATIVE Final    Comment: (NOTE) The Xpert Xpress SARS-CoV-2/FLU/RSV plus assay is intended as an aid in the diagnosis of influenza from Nasopharyngeal swab specimens and should not be used as a sole basis for treatment. Nasal washings and aspirates are unacceptable for Xpert Xpress SARS-CoV-2/FLU/RSV testing.  Fact Sheet for Patients: EntrepreneurPulse.com.au  Fact Sheet for Healthcare  Providers: IncredibleEmployment.be  This test is not yet approved or cleared by the [place] FDA and has been authorized for detection and/or diagnosis of SARS-CoV-2 by FDA under an Emergency Use Authorization (EUA). This EUA will remain in effect (meaning this test can be used) for the duration of the COVID-19 declaration under Section 564(b)(1) of the Act, 21 U.S.C. section 360bbb-3(b)(1), unless the authorization is terminated or revoked.     Resp Syncytial Virus by PCR NEGATIVE NEGATIVE Final    Comment: (NOTE) Fact Sheet for Patients: EntrepreneurPulse.com.au  Fact Sheet for Healthcare Providers: IncredibleEmployment.be  This test is not yet approved or cleared by the [place] FDA and has been authorized for detection and/or diagnosis of SARS-CoV-2 by FDA under an Emergency Use Authorization (EUA). This EUA will remain in effect (meaning this test can be used) for the duration of the COVID-19 declaration under Section 564(b)(1) of the Act, 21 U.S.C. section 360bbb-3(b)(1), unless the authorization is terminated or revoked.  Performed at [facility], [place] [address], [place], [place] [address]     Radiology Studies: VAS [place] LOWER EXTREMITY VENOUS (DVT)  Result Date: 11/18/2022  Lower Venous DVT Study Patient Name:  Jessica Yates [facility]  Date of Exam:   11/18/2022 Medical Rec #: [id number]          Accession #:    [id number] Date of Birth: [birth year]          Patient Gender: F Patient Age:   89 years Exam Location:  [facility] Procedure:      VAS [place] LOWER EXTREMITY VENOUS (DVT) Referring Phys: Babs Bertin --------------------------------------------------------------------------------  Indications: Pain, Swelling, and Erythema (cellulitis).  Risk Factors: HX of DVT. Limitations: Poor ultrasound/tissue interface. Comparison Study: Previous exam (reflux) on 07/24/21 was negative for DVT  Performing Technologist: Rogelia Rohrer RVT, RDMS  Examination Guidelines: A complete evaluation includes B-mode imaging, spectral Doppler, color Doppler, and power Doppler as needed of all accessible portions of each vessel. Bilateral testing is considered an integral part of a complete examination. Limited examinations for reoccurring indications may be performed as noted. The reflux portion of the exam is performed with the patient in reverse Trendelenburg.  +---------+---------------+---------+-----------+----------+-------------------+ RIGHT    CompressibilityPhasicitySpontaneityPropertiesThrombus Aging      +---------+---------------+---------+-----------+----------+-------------------+ CFV      Full           Yes      Yes                                      +---------+---------------+---------+-----------+----------+-------------------+ SFJ      Full                                                             +---------+---------------+---------+-----------+----------+-------------------+ FV Prox  Full           Yes      Yes                                      +---------+---------------+---------+-----------+----------+-------------------+ FV Mid   Full           Yes  Yes                                      +---------+---------------+---------+-----------+----------+-------------------+ FV DistalFull           Yes      Yes                                      +---------+---------------+---------+-----------+----------+-------------------+ PFV      Full                                                             +---------+---------------+---------+-----------+----------+-------------------+ POP      Full           Yes      Yes                                      +---------+---------------+---------+-----------+----------+-------------------+ PTV      Full                                         Not well visualized  +---------+---------------+---------+-----------+----------+-------------------+ PERO     Full                                         Not well visualized +---------+---------------+---------+-----------+----------+-------------------+   +----+---------------+---------+-----------+----------+--------------+ LEFTCompressibilityPhasicitySpontaneityPropertiesThrombus Aging +----+---------------+---------+-----------+----------+--------------+ CFV Full           Yes      Yes                                 +----+---------------+---------+-----------+----------+--------------+     Summary: RIGHT: - There is no evidence of deep vein thrombosis in the lower extremity.  - No cystic structure found in the popliteal fossa. Subcutaneous edema seen in area of calf and ankle.  LEFT: - No evidence of common femoral vein obstruction.  *See table(s) above for measurements and observations. Electronically signed by Orlie Pollen on 11/18/2022 at 4:22:29 PM.    Final    CT Angio Chest PE W and/or Wo Contrast  Result Date: 11/17/2022 CLINICAL DATA:  Right lower extremity pain and swelling with coughing, shortness of breath and prior history of DVT. EXAM: CT ANGIOGRAPHY CHEST WITH CONTRAST TECHNIQUE: Multidetector CT imaging of the chest was performed using the standard protocol during bolus administration of intravenous contrast. Multiplanar CT image reconstructions and MIPs were obtained to evaluate the vascular anatomy. RADIATION DOSE REDUCTION: This exam was performed according to the departmental dose-optimization program which includes automated exposure control, adjustment of the mA and/or kV according to patient size and/or use of iterative reconstruction technique. CONTRAST:  21m OMNIPAQUE IOHEXOL 350 MG/ML SOLN COMPARISON:  Portable chest today, PA and lateral chest 01/01/2022, chest CT without contrast 03/22/2022, and CTA chest 12/28/2015. FINDINGS: Cardiovascular: Mild panchamber cardiomegaly. No  pericardial effusion. The pulmonary  veins mildly distended but no more than previously. There is chronic prominence of the pulmonary trunk which measures 3.3 cm indicating arterial hypertension. No arterial embolic filling defect is seen. There is mild aortic atherosclerosis and tortuosity without aneurysm, stenosis or dissection. The great vessels are clear with tortuosity. There is scattered calcification LAD coronary artery only. Mediastinum/Nodes: There is no intrathoracic or axillary adenopathy. There is no thyroid mass. There is retained versus refluxed fluid with in the distal thoracic esophagus but no esophageal wall thickening no hiatal hernia. The trachea and central airways are clear. Lungs/Pleura: Chronic biapical pleural-parenchymal scarring. Rounded atelectasis again noted in the posterior basal right lower lobe. There are scattered bilateral basal linear scar-like opacities. There is no pleural effusion or pneumothorax. There is artifact from respiratory motion. No focal pneumonia or mass is seen. Upper Abdomen: No acute abnormality. Musculoskeletal: Moderate to severe thoracic kyphosis approaching 90 degrees kyphotic angle. Mild chronic wedging in the midthoracic spine is unchanged. Osteopenia and thoracic spondylosis. No acute or other significant osseous findings. No chest wall mass is seen. Review of the MIP images confirms the above findings. IMPRESSION: 1. No acute chest CT or CTA findings. 2. Cardiomegaly with mildly distended pulmonary veins but no more than previously. 3. Chronic prominence of the pulmonary trunk indicating arterial hypertension. 4. Aortic and coronary artery atherosclerosis. 5. Retained versus refluxed fluid in the distal thoracic esophagus. 6. Osteopenia and degenerative change with moderate to severe thoracic kyphosis. Aortic Atherosclerosis (ICD10-I70.0). Electronically Signed   By: Telford Nab M.D.   On: 11/17/2022 23:01   DG Tibia/Fibula Right Port  Result Date:  11/17/2022 CLINICAL DATA:  Right leg pain and swelling, initial encounter EXAM: PORTABLE RIGHT TIBIA AND FIBULA - 2 VIEW COMPARISON:  None Available. FINDINGS: Degenerative changes of the knee joint are seen. No acute fracture or dislocation is noted. No soft tissue abnormality is seen. IMPRESSION: No acute abnormality noted. Electronically Signed   By: Inez Catalina M.D.   On: 11/17/2022 22:27   DG Chest Port 1 View  Result Date: 11/17/2022 CLINICAL DATA:  Questionable sepsis. EXAM: PORTABLE CHEST 1 VIEW COMPARISON:  Chest CT dated 03/22/2022. FINDINGS: No focal consolidation, pleural effusion, pneumothorax. Top-normal cardiac size. No acute osseous pathology. IMPRESSION: No active disease. Electronically Signed   By: Anner Crete M.D.   On: 11/17/2022 21:28    Scheduled Meds:  aspirin  81 mg Oral Daily   enoxaparin (LOVENOX) injection  40 mg Subcutaneous Q24H   famotidine  40 mg Oral QHS   fluticasone  2 spray Each Nare Daily   insulin aspart  0-9 Units Subcutaneous TID WC   loratadine  10 mg Oral Daily   pantoprazole  40 mg Oral Daily   phosphorus  500 mg Oral BID   rosuvastatin  40 mg Oral Daily   Continuous Infusions:  ceFEPime (MAXIPIME) IV 2 g (11/18/22 2214)   vancomycin Stopped (11/18/22 1414)    LOS: 2 days   Raiford Noble, DO [facility] Available via Epic secure chat 7am-7pm After these hours, please refer to coverage provider listed on [email or web address] 11/19/2022, 8:50 AM

## 2022-11-19 NOTE — Progress Notes (Signed)
   11/19/22 1355  Spiritual Encounters  Type of Visit Initial  Care provided to: Patient  Referral source Nurse (RN/NT/LPN)  Reason for visit Advance directives  OnCall Visit Yes  Spiritual Framework  Presenting Themes Goals in life/care;Significant life change   Chaplain responded to a spiritual consult request for advanced directive education.  I went over the documents with the patient, Jessica Yates. I also went over next steps once she completes the documents identifying how she would like her care to look and who she would like to represent her. I also shared with her that if she has any questions to let her nurse know. A chaplain would return and work with her.   Trish Sanmina-SCI [phone]

## 2022-11-19 NOTE — Progress Notes (Signed)
PHARMACY NOTE -  Cefepime  Pharmacy has been assisting with dosing of cefepime for cellulitis. Will adjust dose to 2g IV q8 hr with improved CrCl; further renal adjustments per institutional Pharmacy antibiotic protocol  Pharmacy will sign off, following peripherally for culture results, dose adjustments, and length of therapy. Please reconsult if a change in clinical status warrants re-evaluation of dosage.  Reuel Boom, PharmD, BCPS [phone] 11/19/2022, 4:48 PM

## 2022-11-20 DIAGNOSIS — J309 Allergic rhinitis, unspecified: Secondary | ICD-10-CM | POA: Diagnosis not present

## 2022-11-20 DIAGNOSIS — I5032 Chronic diastolic (congestive) heart failure: Secondary | ICD-10-CM | POA: Diagnosis not present

## 2022-11-20 DIAGNOSIS — L03115 Cellulitis of right lower limb: Secondary | ICD-10-CM | POA: Diagnosis not present

## 2022-11-20 DIAGNOSIS — A419 Sepsis, unspecified organism: Secondary | ICD-10-CM | POA: Diagnosis not present

## 2022-11-20 LAB — COMPREHENSIVE METABOLIC PANEL
ALT: 22 U/L (ref 0–44)
AST: 24 U/L (ref 15–41)
Albumin: 3 g/dL — ABNORMAL LOW (ref 3.5–5.0)
Alkaline Phosphatase: 76 U/L (ref 38–126)
Anion gap: 6 (ref 5–15)
BUN: 14 mg/dL (ref 8–23)
CO2: 23 mmol/L (ref 22–32)
Calcium: 9 mg/dL (ref 8.9–10.3)
Chloride: 106 mmol/L (ref 98–111)
Creatinine, Ser: 0.83 mg/dL (ref 0.44–1.00)
GFR, Estimated: >60 mL/min (ref >60)
Glucose, Bld: 134 mg/dL — ABNORMAL HIGH (ref 70–99)
Potassium: 3.7 mmol/L (ref 3.5–5.1)
Sodium: 135 mmol/L (ref 135–145)
Total Bilirubin: 0.4 mg/dL (ref 0.3–1.2)
Total Protein: 6.2 g/dL — ABNORMAL LOW (ref 6.5–8.1)

## 2022-11-20 LAB — GLUCOSE, CAPILLARY
Glucose-Capillary: 131 mg/dL — ABNORMAL HIGH (ref 70–99)
Glucose-Capillary: 187 mg/dL — ABNORMAL HIGH (ref 70–99)
Glucose-Capillary: 103 mg/dL — ABNORMAL HIGH (ref 70–99)
Glucose-Capillary: 287 mg/dL — ABNORMAL HIGH (ref 70–99)

## 2022-11-20 LAB — CBC WITH DIFFERENTIAL/PLATELET
Abs Immature Granulocytes: 0.03 10*3/uL (ref 0.00–0.07)
Basophils Absolute: 0 10*3/uL (ref 0.0–0.1)
Basophils Relative: 0 %
Eosinophils Absolute: 0.2 10*3/uL (ref 0.0–0.5)
Eosinophils Relative: 2 %
HCT: 33.5 % — ABNORMAL LOW (ref 36.0–46.0)
Hemoglobin: 10.7 g/dL — ABNORMAL LOW (ref 12.0–15.0)
Immature Granulocytes: 0 %
Lymphocytes Relative: 10 %
Lymphs Abs: 0.9 10*3/uL (ref 0.7–4.0)
MCH: 26.6 pg (ref 26.0–34.0)
MCHC: 31.9 g/dL (ref 30.0–36.0)
MCV: 83.1 fL (ref 80.0–100.0)
Monocytes Absolute: 0.6 10*3/uL (ref 0.1–1.0)
Monocytes Relative: 6 %
Neutro Abs: 7.3 10*3/uL (ref 1.7–7.7)
Neutrophils Relative %: 82 %
Platelets: 124 10*3/uL — ABNORMAL LOW (ref 150–400)
RBC: 4.03 MIL/uL (ref 3.87–5.11)
RDW: 15.4 % (ref 11.5–15.5)
WBC: 9 10*3/uL (ref 4.0–10.5)
nRBC: 0 % (ref 0.0–0.2)

## 2022-11-20 LAB — MAGNESIUM: Magnesium: 2.1 mg/dL (ref 1.7–2.4)

## 2022-11-20 LAB — PHOSPHORUS: Phosphorus: 3 mg/dL (ref 2.5–4.6)

## 2022-11-20 MED ORDER — FUROSEMIDE 40 MG PO TABS: 40.0000 mg | ORAL_TABLET | ORAL | Status: DC

## 2022-11-20 MED ORDER — POTASSIUM CHLORIDE 20 MEQ PO PACK
40.0000 meq | PACK | Freq: Once | ORAL | Status: AC
  Administered 2022-11-20: 40 meq via ORAL
  Filled 2022-11-20: qty 2

## 2022-11-20 MED ORDER — FUROSEMIDE 10 MG/ML IJ SOLN
40.0000 mg | Freq: Once | INTRAMUSCULAR | Status: AC
  Administered 2022-11-20: 40 mg via INTRAVENOUS
  Filled 2022-11-20: qty 4

## 2022-11-20 MED ORDER — CEFAZOLIN SODIUM-DEXTROSE 2-4 GM/100ML-% IV SOLN
2.0000 g | Freq: Three times a day (TID) | INTRAVENOUS | Status: DC
  Administered 2022-11-20 – 2022-11-22 (×6): 2 g via INTRAVENOUS
  Filled 2022-11-20 (×7): qty 100

## 2022-11-20 MED ORDER — FUROSEMIDE 40 MG PO TABS
40.0000 mg | ORAL_TABLET | Freq: Every day | ORAL | Status: DC
  Administered 2022-11-21 – 2022-11-22 (×2): 40 mg via ORAL
  Filled 2022-11-20 (×2): qty 1

## 2022-11-20 NOTE — Progress Notes (Signed)
Pharmacy Antibiotic Note  Jessica Yates is a 72 y.o. female admitted on 11/17/2022 with cellulitis.  Pt reported right leg pain and redness spreading to her calf and thigh.  She was initially started on Vancomycin and Cefepime.  Pharmacy has been consulted to narrow antibiotics to Cefazolin dosing.  Plan: Cefazolin 2g IV q8h Pharmacy will sign off consult, but will follow up peripherally for renal dosing if needed.    Height: '5\' 7"'$  (170.2 cm) Weight: 98.1 kg (216 lb 4.3 oz) IBW/kg (Calculated) : 61.6  Temp (24hrs), Avg:98.8 F (37.1 C), Min:98.3 F (36.8 C), Max:99.6 F (37.6 C)  Recent Labs  Lab 11/17/22 2138 11/17/22 2313 11/18/22 0503 11/19/22 0539 11/20/22 0542  WBC 12.8*  --  13.1* 11.0* 9.0  CREATININE 1.14*  --  1.22* 0.94 0.83  LATICACIDVEN 1.4 1.5  --   --   --     Estimated Creatinine Clearance: 73.7 mL/min (by C-G formula based on SCr of 0.83 mg/dL).    No Known Allergies  Antimicrobials this admission: 3/6 zosyn x 1 3/6 vanc >> 3/9 3/7 cefepime >> 3/9 3/9 Cefazolin >>     Microbiology results: 3/6 BCx: ngtd 3/6 Resp panel: negative  Thank you for allowing pharmacy to be a part of this patient's care.  Gretta Arab PharmD, BCPS [facility] main pharmacy [phone] 11/20/2022 10:22 AM

## 2022-11-20 NOTE — ED Provider Notes (Addendum)
[place]   [id number] 11/17/22 Arrival Time: [place]:  1. Pain and swelling of right lower leg   2. Tachycardia   3. Chest pain, unspecified type   4. Hypoxia   5. Cellulitis of leg, right    I'm sure she has cellulitis of leg. Cannot r/o DVT and/or PE in setting of reported SOB, low SpO2, tachycardia. Discussed need for ED workup. She agrees. Will likely need hospitalization to control cellulitis. Does not appear septic.  To ED via POV. Stable upon discharge.  Recommend:  Follow-up Information     Go to  [facility] Emergency Department at [facility].   Specialty: Emergency Medicine Contact information: [address] [id number] [place] [phone]                Reviewed expectations re: course of current medical issues. Questions answered. Outlined signs and symptoms indicating need for more acute intervention. Patient verbalized understanding. After Visit Summary given.  SUBJECTIVE: History from: patient. Jessica Yates is a 72 y.o. female with h/o DM, HTN who reports R lower leg pain; x 1 day. Denies fever. Standing/waling is painful. Denies trauma. Also reports miild SOB upon arrival and "chest tightness a little". Takes no blood thinners. Erythema of R lower leg has spread per her report.  Past Surgical History:  Procedure Laterality Date   COSMETIC SURGERY  1975   nasal reconstruction   TOTAL ABDOMINAL HYSTERECTOMY  07/2001      OBJECTIVE:  Vitals:   11/17/22 1934  BP: 104/65  Pulse: (!) 115  Resp: 18  Temp: 98.2 F (36.8 C)  TempSrc: Oral  SpO2: 90%    General appearance: alert; no distress HEENT: [place]; AT Neck: supple with FROM Resp: unlabored respirations Extremities: RLE: large area of hot and red skin over most of anterior lower leg; TTP; no specific calf swelling Skin: warm and dry; no visible rashes Neurologic: gait normal; normal sensation and strength of  bilateral LE Psychological: alert and cooperative; normal mood and affect   No Known Allergies  Past Medical History:  Diagnosis Date   Allergic rhinitis    Anemia 2002   Anxiety    Arthritis    Asthma    Blood clot in vein    Cellulitis    Cellulitis of right lower extremity    Chronic diastolic (congestive) heart failure (HCC)    Chronic kidney disease, stage 3 (HCC)    Coagulation defect (HCC)    Cough    Depression 02/06/2008   Diabetes mellitus, type 2 (HCC)    DM2 (diabetes mellitus, type 2) ([place]) 11/18/2022   DVT (deep venous thrombosis) ([place]) 2002   left leg   Dysphagia    Elevated hemoglobin A1c    GERD (gastroesophageal reflux disease)    Glaucoma    Holosystolic murmur    Kidney cysts    Leukocytosis    Liver cyst    Lymphedema    Morbid obesity (HCC)    Overactive bladder    Palpitations    Peripheral venous insufficiency    Primary open angle glaucoma (POAG) of both eyes, mild stage    Seasonal allergies    Sickle cell trait (HCC)    Tortuous aorta (HCC)    Unstable gait    Upper airway cough syndrome    Vitamin D deficiency    Social History   Socioeconomic History   Marital status: Single    Spouse name: Not  on file   Number of children: 0   Years of education: Not on file   Highest education level: Not on file  Occupational History   Occupation: retired from [employer]  Tobacco Use   Smoking status: Never   Smokeless tobacco: Never  Vaping Use   Vaping Use: Never used  Substance and Sexual Activity   Alcohol use: No   Drug use: No   Sexual activity: Never  Other Topics Concern   Not on file  Social History Narrative   Lives with mother and brother         Social Determinants of Health   Financial Resource Strain: Not on file  Food Insecurity: No Food Insecurity (11/18/2022)   Hunger Vital Sign    Worried About Running Out of Food in the Last Year: Never true    Ran Out of Food in the Last Year: Never true   Transportation Needs: No Transportation Needs (11/18/2022)   PRAPARE - Hydrologist (Medical): No    Lack of Transportation (Non-Medical): No  Physical Activity: Not on file  Stress: Not on file  Social Connections: Not on file   Family History  Problem Relation Age of Onset   Allergies Mother    Asthma Mother    Other Mother        enlarged heart   Arthritis Mother    Breast cancer Mother    Diabetes Mother    Hypertension Mother    Cancer Mother        breast   Hyperlipidemia Mother    Heart disease Maternal Grandmother    Breast cancer Maternal Grandmother    Birth defects Maternal Grandmother        breast   Cancer Father 49       leukemia   Allergies Sister    Arthritis Sister    Past Surgical History:  Procedure Laterality Date   COSMETIC SURGERY  1975   nasal reconstruction   TOTAL ABDOMINAL HYSTERECTOMY  07/2001       Vanessa Kick, MD 11/20/22 1037    Vanessa Kick, MD 11/20/22 1038

## 2022-11-20 NOTE — Progress Notes (Signed)
PROGRESS NOTE    Jessica Yates  [id number] DOB: 10-08-1950 DOA: 11/17/2022 PCP: Lucianne Lei, MD   Brief Narrative:  Patient is a 72 year old obese African-American female with past medical history significant for but not limited to chronic diastolic CHF, diabetes mellitus type 2, left lower extremity DVT in 2002, right lower extremity DVT in 2019 as well as other comorbidities who presents to [facility] complaining of right lower extremity swelling and erythema.  She started having progressive right lower extremity erythema and noticed that it progressively got worse and was more tender to touch and had more swelling.  She had no associated drainage and no acute numbness, paresthesias.  Because of her worsening leg she presented for further evaluation and has had cellulitis in the past.  Upon admission she was found to be septic from the Right Lower Extremity Cellulitis.  Remains swollen but cellulitis is slowly improving and is not as erythematous or warm.  She needs to continue to elevate it.  Will continue IV vancomycin and IV cefepime and she is febrile the night before last but is now improved.  Will de-escalate antibiotics to IV cefazolin given that she is improving.  Legs remain swollen so we will give her dose of IV Lasix today and resume home Lasix in the AM.  Anticipating discharge in the next 24 to 48 hours given that she continues to improve slowly  Assessment and Plan:  Sepsis 2/2 Severe Cellulitis of Right Lower Extremity, slowly improving -Met SIRS criteria on Admission with Fever, Leukocytosis, Tachycardia and Source of Infection with right lower extremity erythema and tenderness; patient was Febrile to 100.6 the night before last  -DG x-ray of the leg shows no evidence of acute osseous abnormality and she does not have any acute purulent discharge -WBC Trend and Lactic Acid Trend Recent Labs  Lab 11/17/22 2138 11/17/22 2313 11/18/22 0503 11/19/22 0539 11/20/22 0542   WBC 12.8*  --  13.1* 11.0* 9.0  LATICACIDVEN 1.4 1.5  --   --   --   -Patient did not receive the 30 mL/kg IV fluid boluses given underlying CHF Blood cultures x 2 obtained prior to initiation of broad-spectrum antibiotics of IV vancomycin and Zosyn in the ED; Blood Cx x2 showed NGTD at 3 Days  -Continued IV vancomycin and IV cefepime but will de-escalate to IV Cefazolin  -Continue monitor for signs and symptoms infection follow cultures and temperature curve -Lower extremity venous duplex negative for DVT but legs still swollen -Continue to monitor on telemetry and continue supportive care with acetaminophen for fevers -PT OT recommending no follow-up   Chronic Diastolic CHF -Most recent echocardiogram was done in December 2022 with an LVEF of 70 to 75% with indeterminate diastolic parameters as well as mildly reduced right ventricular systolic function -BNP on Admission was 155 -Currently does not appear clinically decompensated but does have leg swelling in the setting of Cellulitis (Right worse than Left)   -Continue home regimen of oral Lasix 40 mg p.o. EOD in the AM and give her a dose of IV Lasix 40 mg today given continued Leg swelling   -Strict I's and O's and daily weights  Intake/Output Summary (Last 24 hours) at 11/20/2022 1557 Last data filed at 11/20/2022 1333 Gross per 24 hour  Intake 1030.53 ml  Output 3900 ml  Net -2869.47 ml   -Continue monitor for signs and symptoms of volume overload   Diabetes Mellitus Type 2 -Continue to hold her oral hypoglycemic agents including Dapagliflozin -Last  hemoglobin A1c was 6.9 in August 2020 -Add sensitive NovoLog/scale insulin before meals and at bedtime -Hemoglobin A1c was 7.1 -Continue monitor CBGs per protocol and if necessary will adjust insulin regimen as necessary -CBGs ranging from 116-231   Allergic Rhinitis -Continue home Fluticasone 2 spray Each Nare Daily and Loratadine 10 mg po Daily   HLD -C/w Rosuvastatin 40 mg po  Daily    Hypokalemia -K+ Trend: Recent Labs  Lab 11/17/22 2138 11/18/22 0503 11/19/22 0539 11/20/22 0542  K 3.8 3.4* 4.2 3.7  -Replete with po Kcl 40 mEQ Packet given that she is about to get IV Lasix  -Continue to Monitor and Replete as Necessary  -Repeat CMP in the AM    Hypophosphatemia -Patient's Phos Level Trend: Recent Labs  Lab 11/19/22 0539 11/20/22 0542  PHOS 1.8* 3.0  -Replete with po K Phos Neutral 500 mg po BID x2 yesterday -Continue to Monitor and Trend and Repeat Phos Level in the AM   Thrombocytopenia -Platelet Count Trend: Recent Labs  Lab 11/17/22 2138 11/18/22 0503 11/19/22 0539 11/20/22 0542  PLT 163 142* 137* 124*  -Continue to Monitor for S/Sx of Bleeding; No overt bleeding noted -Repeat CBC in the AM   Normocytic Anemia -Hgb/Hct Trend: Recent Labs  Lab 11/17/22 2138 11/18/22 0503 11/19/22 0539 11/20/22 0542  HGB 12.8 10.8* 11.3* 10.7*  HCT 40.1 33.4* 35.6* 33.5*  MCV 83.5 83.5 83.4 83.1  -Check Anemia Panel in the AM  -Continue to Monitor S/Sx of Bleeding; No overt bleeding noted -Repeat CBC in the AM   Hypoalbuminemia -Patient's Albumin Trend: Recent Labs  Lab 11/17/22 2138 11/18/22 0503 11/19/22 0539 11/20/22 0542  ALBUMIN 4.1 3.2* 3.4* 3.0*  -Continue to Monitor and Trend and repeat CMP in the AM   GERD/GI Prophylaxis -C/w PPI Pantoprazole 40 mg Daily   Obesity -Complicates overall prognosis and care -Estimated body mass index is 33.87 kg/m as calculated from the following:   Height as of this encounter: '5\' 7"'$  (1.702 m).   Weight as of this encounter: 98.1 kg.  -Weight Loss and Dietary Counseling given   Glaucoma  -C/w Timolol 1 drop in Both Eyes BID daily and Travoprost substitution with Latanoprost 0.005% Ophthalmic Solution 1 drop Both Eyes qHS  DVT prophylaxis: enoxaparin (LOVENOX) injection 40 mg Start: 11/18/22 0800    Code Status: Full Code Family Communication: No family currently at  bedside  Disposition Plan:  Level of care: Progressive Status is: Inpatient Remains inpatient appropriate because: Needs further clinical improvement is slowly improving.  Will give her a dose of diuresis given her legs remain swollen.  Anticipate discharging home in next 24 to 48 hours   Consultants:  None  Procedures:  As delineated as above  Antimicrobials:  Anti-infectives (From admission, onward)    Start     Dose/Rate Route Frequency Ordered Stop   11/20/22 1400  ceFAZolin (ANCEF) IVPB 2g/100 mL premix        2 g 200 mL/hr over 30 Minutes Intravenous Every 8 hours 11/20/22 1027     11/19/22 2200  ceFEPIme (MAXIPIME) 2 g in sodium chloride 0.9 % 100 mL IVPB  Status:  Discontinued        2 g 200 mL/hr over 30 Minutes Intravenous Every 8 hours 11/19/22 1648 11/20/22 1017   11/18/22 1200  vancomycin (VANCOCIN) IVPB 1000 mg/200 mL premix  Status:  Discontinued        1,000 mg 200 mL/hr over 60 Minutes Intravenous Every 24 hours 11/18/22 0006  11/20/22 1017   11/18/22 1000  ceFEPIme (MAXIPIME) 2 g in sodium chloride 0.9 % 100 mL IVPB  Status:  Discontinued        2 g 200 mL/hr over 30 Minutes Intravenous Every 12 hours 11/18/22 0006 11/19/22 1648   11/17/22 2130  vancomycin (VANCOCIN) IVPB 1000 mg/200 mL premix        1,000 mg 200 mL/hr over 60 Minutes Intravenous  Once 11/17/22 2122 11/17/22 2306   11/17/22 2130  piperacillin-tazobactam (ZOSYN) IVPB 3.375 g        3.375 g 100 mL/hr over 30 Minutes Intravenous  Once 11/17/22 2122 11/17/22 2306       Subjective: Seen and examined at bedside and still complains of her "leg feeling heavy" especially when she tries to ambulate.  Walking fine with the mobility tech.  No nausea or vomiting.  Legs remain swollen.  No chest pain or shortness of breath.  Happy that her WBC is now normal.  No other concerns or close at this time.  Objective: Vitals:   11/19/22 2059 11/20/22 0554 11/20/22 0600 11/20/22 1328  BP: 120/78 124/83  123/79   Pulse: 92 83  90  Resp:  20  16  Temp: 98.4 F (36.9 C) 99.6 F (37.6 C)  98 F (36.7 C)  TempSrc: Oral Oral  Oral  SpO2: 98% 99%  99%  Weight:   98.1 kg   Height:        Intake/Output Summary (Last 24 hours) at 11/20/2022 1551 Last data filed at 11/20/2022 1333 Gross per 24 hour  Intake 1030.53 ml  Output 3900 ml  Net -2869.47 ml   Filed Weights   11/18/22 1420 11/19/22 0647 11/20/22 0600  Weight: 103.4 kg 99.8 kg 98.1 kg   Examination: Physical Exam:  Constitutional: WN/WD obese African-American female currently no acute distress appears calm laying in the bed Respiratory: Diminished to auscultation bilaterally with coarse breath sounds, no wheezing, rales, rhonchi or crackles. Normal respiratory effort and patient is not tachypenic. No accessory muscle use.  Unlabored breathing Cardiovascular: RRR, no murmurs / rubs / gallops. S1 and S2 auscultated.  Has 1+ lower extremity edema worse on the right compared to left with some erythema on the right. Abdomen: Soft, non-tender, distended secondary body habitus.  Bowel sounds positive.  GU: Deferred. Musculoskeletal: No clubbing / cyanosis of digits/nails. No joint deformity upper and lower extremities. Skin: Right leg is warm and erythematous and swollen but erythema is improving and not as warm as yesterday.   Neurologic: CN 2-12 grossly intact with no focal deficits. Romberg sign and cerebellar reflexes not assessed.  Psychiatric: Normal judgment and insight. Alert and oriented x 3. Normal mood and appropriate affect.   Data Reviewed: I have personally reviewed following labs and imaging studies  CBC: Recent Labs  Lab 11/17/22 2138 11/18/22 0503 11/19/22 0539 11/20/22 0542  WBC 12.8* 13.1* 11.0* 9.0  NEUTROABS 11.8* 12.0* 9.4* 7.3  HGB 12.8 10.8* 11.3* 10.7*  HCT 40.1 33.4* 35.6* 33.5*  MCV 83.5 83.5 83.4 83.1  PLT 163 142* 137* [id number]*   Basic Metabolic Panel: Recent Labs  Lab 11/17/22 2138 11/17/22 2317  11/18/22 0503 11/19/22 0539 11/20/22 0542  NA 138  --  138 139 135  K 3.8  --  3.4* 4.2 3.7  CL 103  --  108 108 106  CO2 26  --  '22 22 23  '$ GLUCOSE 206*  --  210* 111* 134*  BUN 17  --  15 11  14  CREATININE 1.14*  --  1.22* 0.94 0.83  CALCIUM 10.0  --  9.4 9.8 9.0  MG  --  2.0 2.1 2.1 2.1  PHOS  --   --   --  1.8* 3.0   GFR: Estimated Creatinine Clearance: 73.7 mL/min (by C-G formula based on SCr of 0.83 mg/dL). Liver Function Tests: Recent Labs  Lab 11/17/22 2138 11/18/22 0503 11/19/22 0539 11/20/22 0542  AST '18 29 21 24  '$ ALT '15 18 20 22  '$ ALKPHOS 76 59 75 76  BILITOT 1.0 0.8 0.6 0.4  PROT 7.9 6.2* 6.7 6.2*  ALBUMIN 4.1 3.2* 3.4* 3.0*   No results for input(s): "LIPASE", "AMYLASE" in the last 168 hours. No results for input(s): "AMMONIA" in the last 168 hours. Coagulation Profile: Recent Labs  Lab 11/17/22 2138  INR 1.0   Cardiac Enzymes: No results for input(s): "CKTOTAL", "CKMB", "CKMBINDEX", "TROPONINI" in the last 168 hours. BNP (last 3 results) No results for input(s): "PROBNP" in the last 8760 hours. HbA1C: Recent Labs    11/17/22 2138  HGBA1C 7.1*   CBG: Recent Labs  Lab 11/19/22 1150 11/19/22 1714 11/19/22 2202 11/20/22 0733 11/20/22 1122  GLUCAP 174* 116* 231* 131* 187*   Lipid Profile: No results for input(s): "CHOL", "HDL", "LDLCALC", "TRIG", "CHOLHDL", "LDLDIRECT" in the last 72 hours. Thyroid Function Tests: No results for input(s): "TSH", "T4TOTAL", "FREET4", "T3FREE", "THYROIDAB" in the last 72 hours. Anemia Panel: No results for input(s): "VITAMINB12", "FOLATE", "FERRITIN", "TIBC", "IRON", "RETICCTPCT" in the last 72 hours. Sepsis Labs: Recent Labs  Lab 11/17/22 2138 11/17/22 2313  LATICACIDVEN 1.4 1.5    Recent Results (from the past 240 hour(s))  Blood Culture (routine x 2)     Status: None (Preliminary result)   Collection Time: 11/17/22  9:38 PM   Specimen: BLOOD LEFT ARM  Result Value Ref Range Status   Specimen  Description   Final    BLOOD LEFT ARM Performed at [facility] [address] [place], [place] [address]    Special Requests   Final    BOTTLES DRAWN AEROBIC AND ANAEROBIC Blood Culture adequate volume Performed at [facility] [address] [place], [place] [address]    Culture   Final    NO GROWTH 3 DAYS Performed at [facility], [place] [address], [place], [place] [address]    Report Status PENDING  Incomplete  Blood Culture (routine x 2)     Status: None (Preliminary result)   Collection Time: 11/17/22  9:59 PM   Specimen: BLOOD  Result Value Ref Range Status   Specimen Description   Final    BLOOD BLOOD LEFT ARM Performed at [facility] [address], [place], [place] [address]    Special Requests   Final    BOTTLES DRAWN AEROBIC AND ANAEROBIC Blood Culture adequate volume Performed at [facility] [address] [place], [place] [address]    Culture   Final    NO GROWTH 3 DAYS Performed at [facility], [place] [address] [place],  [address]    Report Status PENDING  Incomplete  Resp panel by RT-PCR (RSV, Flu A&B, Covid) Anterior Nasal Swab     Status: None   Collection Time: 11/17/22  9:59 PM   Specimen: Anterior Nasal Swab  Result Value Ref Range Status   SARS Coronavirus 2 by RT PCR NEGATIVE NEGATIVE Final    Comment: (NOTE) SARS-CoV-2 target nucleic acids  are NOT DETECTED.  The SARS-CoV-2 RNA is generally detectable in upper respiratory specimens during the acute phase of infection. The lowest concentration of SARS-CoV-2 viral copies this assay can detect is 138 copies/mL. A negative result does not preclude SARS-Cov-2 infection and should not be used as the sole basis for treatment or other patient management decisions. A negative result may occur with  improper specimen collection/handling, submission of specimen other than nasopharyngeal swab, presence of viral mutation(s)  within the areas targeted by this assay, and inadequate number of viral copies(<138 copies/mL). A negative result must be combined with clinical observations, patient history, and epidemiological information. The expected result is Negative.  Fact Sheet for Patients:  EntrepreneurPulse.com.au  Fact Sheet for Healthcare Providers:  IncredibleEmployment.be  This test is no t yet approved or cleared by the [place] FDA and  has been authorized for detection and/or diagnosis of SARS-CoV-2 by FDA under an Emergency Use Authorization (EUA). This EUA will remain  in effect (meaning this test can be used) for the duration of the COVID-19 declaration under Section 564(b)(1) of the Act, 21 U.S.C.section 360bbb-3(b)(1), unless the authorization is terminated  or revoked sooner.       Influenza A by PCR NEGATIVE NEGATIVE Final   Influenza B by PCR NEGATIVE NEGATIVE Final    Comment: (NOTE) The Xpert Xpress SARS-CoV-2/FLU/RSV plus assay is intended as an aid in the diagnosis of influenza from Nasopharyngeal swab specimens and should not be used as a sole basis for treatment. Nasal washings and aspirates are unacceptable for Xpert Xpress SARS-CoV-2/FLU/RSV testing.  Fact Sheet for Patients: EntrepreneurPulse.com.au  Fact Sheet for Healthcare Providers: IncredibleEmployment.be  This test is not yet approved or cleared by the [place] FDA and has been authorized for detection and/or diagnosis of SARS-CoV-2 by FDA under an Emergency Use Authorization (EUA). This EUA will remain in effect (meaning this test can be used) for the duration of the COVID-19 declaration under Section 564(b)(1) of the Act, 21 U.S.C. section 360bbb-3(b)(1), unless the authorization is terminated or revoked.     Resp Syncytial Virus by PCR NEGATIVE NEGATIVE Final    Comment: (NOTE) Fact Sheet for  Patients: EntrepreneurPulse.com.au  Fact Sheet for Healthcare Providers: IncredibleEmployment.be  This test is not yet approved or cleared by the [place] FDA and has been authorized for detection and/or diagnosis of SARS-CoV-2 by FDA under an Emergency Use Authorization (EUA). This EUA will remain in effect (meaning this test can be used) for the duration of the COVID-19 declaration under Section 564(b)(1) of the Act, 21 U.S.C. section 360bbb-3(b)(1), unless the authorization is terminated or revoked.  Performed at [facility], [place] [address], [place], [place] [address]     Radiology Studies: No results found.  Scheduled Meds:  aspirin  81 mg Oral Daily   enoxaparin (LOVENOX) injection  40 mg Subcutaneous Q24H   famotidine  40 mg Oral QHS   fluticasone  2 spray Each Nare Daily   insulin aspart  0-9 Units Subcutaneous TID WC   latanoprost  1 drop Both Eyes QHS   loratadine  10 mg Oral Daily   pantoprazole  40 mg Oral Daily   rosuvastatin  40 mg Oral Daily   timolol  1 drop Both Eyes BID   Continuous Infusions:   ceFAZolin (ANCEF) IV 2 g (11/20/22 1328)    LOS: 3 days   Raiford Noble, DO [facility] Available via Epic secure chat 7am-7pm After these hours, please refer to coverage provider  listed on [email or web address] 11/20/2022, 3:51 PM

## 2022-11-21 DIAGNOSIS — A419 Sepsis, unspecified organism: Secondary | ICD-10-CM | POA: Diagnosis not present

## 2022-11-21 DIAGNOSIS — L03115 Cellulitis of right lower limb: Secondary | ICD-10-CM | POA: Diagnosis not present

## 2022-11-21 DIAGNOSIS — I5032 Chronic diastolic (congestive) heart failure: Secondary | ICD-10-CM | POA: Diagnosis not present

## 2022-11-21 DIAGNOSIS — J309 Allergic rhinitis, unspecified: Secondary | ICD-10-CM | POA: Diagnosis not present

## 2022-11-21 LAB — COMPREHENSIVE METABOLIC PANEL
ALT: 22 U/L (ref 0–44)
AST: 22 U/L (ref 15–41)
Albumin: 3 g/dL — ABNORMAL LOW (ref 3.5–5.0)
Alkaline Phosphatase: 98 U/L (ref 38–126)
Anion gap: 7 (ref 5–15)
BUN: 15 mg/dL (ref 8–23)
CO2: 23 mmol/L (ref 22–32)
Calcium: 9.3 mg/dL (ref 8.9–10.3)
Chloride: 106 mmol/L (ref 98–111)
Creatinine, Ser: 0.84 mg/dL (ref 0.44–1.00)
GFR, Estimated: >60 mL/min (ref >60)
Glucose, Bld: 161 mg/dL — ABNORMAL HIGH (ref 70–99)
Potassium: 3.6 mmol/L (ref 3.5–5.1)
Sodium: 136 mmol/L (ref 135–145)
Total Bilirubin: 0.6 mg/dL (ref 0.3–1.2)
Total Protein: 6.8 g/dL (ref 6.5–8.1)

## 2022-11-21 LAB — RETICULOCYTES
Immature Retic Fract: 11.7 % (ref 2.3–15.9)
RBC.: 3.95 MIL/uL (ref 3.87–5.11)
Retic Count, Absolute: 28.8 10*3/uL (ref 19.0–186.0)
Retic Ct Pct: 0.7 % (ref 0.4–3.1)

## 2022-11-21 LAB — IRON AND TIBC
Iron: 25 ug/dL — ABNORMAL LOW (ref 28–170)
Saturation Ratios: 9 % — ABNORMAL LOW (ref 10.4–31.8)
TIBC: 273 ug/dL (ref 250–450)
UIBC: 248 ug/dL

## 2022-11-21 LAB — CBC WITH DIFFERENTIAL/PLATELET
Abs Immature Granulocytes: 0.03 10*3/uL (ref 0.00–0.07)
Basophils Absolute: 0 10*3/uL (ref 0.0–0.1)
Basophils Relative: 0 %
Eosinophils Absolute: 0.3 10*3/uL (ref 0.0–0.5)
Eosinophils Relative: 3 %
HCT: 34.3 % — ABNORMAL LOW (ref 36.0–46.0)
Hemoglobin: 10.9 g/dL — ABNORMAL LOW (ref 12.0–15.0)
Immature Granulocytes: 0 %
Lymphocytes Relative: 15 %
Lymphs Abs: 1.2 10*3/uL (ref 0.7–4.0)
MCH: 26.4 pg (ref 26.0–34.0)
MCHC: 31.8 g/dL (ref 30.0–36.0)
MCV: 83.1 fL (ref 80.0–100.0)
Monocytes Absolute: 0.7 10*3/uL (ref 0.1–1.0)
Monocytes Relative: 9 %
Neutro Abs: 5.8 10*3/uL (ref 1.7–7.7)
Neutrophils Relative %: 73 %
Platelets: 165 10*3/uL (ref 150–400)
RBC: 4.13 MIL/uL (ref 3.87–5.11)
RDW: 15.5 % (ref 11.5–15.5)
WBC: 8 10*3/uL (ref 4.0–10.5)
nRBC: 0 % (ref 0.0–0.2)

## 2022-11-21 LAB — GLUCOSE, CAPILLARY
Glucose-Capillary: 194 mg/dL — ABNORMAL HIGH (ref 70–99)
Glucose-Capillary: 155 mg/dL — ABNORMAL HIGH (ref 70–99)
Glucose-Capillary: 154 mg/dL — ABNORMAL HIGH (ref 70–99)
Glucose-Capillary: 244 mg/dL — ABNORMAL HIGH (ref 70–99)

## 2022-11-21 LAB — VITAMIN B12: Vitamin B-12: 261 pg/mL (ref 180–914)

## 2022-11-21 LAB — FOLATE: Folate: 5.8 ng/mL — ABNORMAL LOW (ref >5.9)

## 2022-11-21 LAB — MAGNESIUM: Magnesium: 2.1 mg/dL (ref 1.7–2.4)

## 2022-11-21 LAB — FERRITIN: Ferritin: 143 ng/mL (ref 11–307)

## 2022-11-21 LAB — PHOSPHORUS: Phosphorus: 2.5 mg/dL (ref 2.5–4.6)

## 2022-11-21 MED ORDER — FUROSEMIDE 10 MG/ML IJ SOLN
40.0000 mg | Freq: Once | INTRAMUSCULAR | Status: AC
  Administered 2022-11-21: 40 mg via INTRAVENOUS
  Filled 2022-11-21: qty 4

## 2022-11-21 MED ORDER — FOLIC ACID 1 MG PO TABS
1.0000 mg | ORAL_TABLET | Freq: Every day | ORAL | Status: DC
  Administered 2022-11-21 – 2022-11-22 (×2): 1 mg via ORAL
  Filled 2022-11-21 (×2): qty 1

## 2022-11-21 MED ORDER — POLYSACCHARIDE IRON COMPLEX 150 MG PO CAPS
150.0000 mg | ORAL_CAPSULE | Freq: Every day | ORAL | Status: DC
  Administered 2022-11-21 – 2022-11-22 (×2): 150 mg via ORAL
  Filled 2022-11-21 (×2): qty 1

## 2022-11-21 MED ORDER — ORAL CARE MOUTH RINSE: 15.0000 mL | OROMUCOSAL | Status: DC | PRN

## 2022-11-21 NOTE — Progress Notes (Addendum)
PROGRESS NOTE    EMMILYNN Yates  [id number] DOB: 02/27/1951 DOA: 11/17/2022 PCP: Lucianne Lei, MD   Brief Narrative:  Patient is a 72 year old obese African-American female with past medical history significant for but not limited to chronic diastolic CHF, diabetes mellitus type 2, left lower extremity DVT in 2002, right lower extremity DVT in 2019 as well as other comorbidities who presents to [facility] complaining of right lower extremity swelling and erythema.  She started having progressive right lower extremity erythema and noticed that it progressively got worse and was more tender to touch and had more swelling.  She had no associated drainage and no acute numbness, paresthesias.  Because of her worsening leg she presented for further evaluation and has had cellulitis in the past.  Upon admission she was found to be septic from the Right Lower Extremity Cellulitis.  Remains swollen but cellulitis is slowly improving and is not as erythematous or warm.  She needs to continue to elevate it.  Will continue IV vancomycin and IV cefepime and she is febrile the night before last but is now improved.  Will de-escalate antibiotics to IV cefazolin given that she is improving.  Legs remain swollen so we will give her dose of IV Lasix today and resume home Lasix in the AM.   Anticipating discharge in the next 24 to 48 hours given that she continues to improve slowly.  Will continue IV Lasix today and overnight her IV was bothering her so her left arm IV was removed and she ended up getting a phlebitis.  We have ordered her warm compresses.  Assessment and Plan:  Sepsis 2/2 Severe Cellulitis of Right Lower Extremity, slowly improving -Met SIRS criteria on Admission with Fever, Leukocytosis, Tachycardia and Source of Infection with right lower extremity erythema and tenderness; he was febrile few days ago but fever seems to have improved and resolved -DG x-ray of the leg shows no evidence of acute  osseous abnormality and she does not have any acute purulent discharge -WBC Trend and Lactic Acid Trend Last Labs         Recent Labs  Lab 11/17/22 2138 11/17/22 2313 11/18/22 0503 11/19/22 0539 11/20/22 0542  WBC 12.8*  --  13.1* 11.0* 9.0  LATICACIDVEN 1.4 1.5  --   --   --     -Patient did not receive the 30 mL/kg IV fluid boluses given underlying CHF Blood cultures x 2 obtained prior to initiation of broad-spectrum antibiotics of IV vancomycin and Zosyn in the ED; Blood Cx x2 showed NGTD at 4 Days  -Continued IV vancomycin and IV cefepime but will de-escalate to IV Cefazolin  -Continue monitor for signs and symptoms infection follow cultures and temperature curve -Lower extremity venous duplex negative for DVT but legs still swollen -Continue to monitor on telemetry and continue supportive care with acetaminophen for fevers -PT OT recommending no follow-up   Chronic Diastolic CHF -Most recent echocardiogram was done in December 2022 with an LVEF of 70 to 75% with indeterminate diastolic parameters as well as mildly reduced right ventricular systolic function -BNP on Admission was 155 -Currently does not appear clinically decompensated but does have leg swelling in the setting of Cellulitis (Right worse than Left)   -Resumed home regimen of oral Lasix 40 mg p.o. but instead of every other day mated daily and have given her an additional  dose of IV Lasix 40 mg today given continued Leg swelling   -Strict I's and O's and daily weights  Intake/Output Summary (Last 24 hours) at 11/21/2022 1348 Last data filed at 11/21/2022 1202 Gross per 24 hour  Intake 1040 ml  Output 3200 ml  Net -2160 ml  -Patient is -2.663 L since admission -Continue monitor for signs and symptoms of volume overload   Diabetes Mellitus Type 2 -Continue to hold her oral hypoglycemic agents including Dapagliflozin -Last hemoglobin A1c was 6.9 in August 2020 -Add sensitive NovoLog/scale insulin before meals and  at bedtime -Hemoglobin A1c was 7.1 -Continue monitor CBGs per protocol and if necessary will adjust insulin regimen as necessary -CBGs ranging from 103-194  Left arm phlebitis -In the setting where her IV was -Continue with IV antibiotics with IV cefazolin and then compresses and if necessary will obtain ultrasound   Allergic Rhinitis -Continue home Fluticasone 2 spray Each Nare Daily and Loratadine 10 mg po Daily   HLD -C/w Rosuvastatin 40 mg po Daily    Hypokalemia -K+ Trend: Recent Labs  Lab 11/17/22 2138 11/18/22 0503 11/19/22 0539 11/20/22 0542 11/21/22 0538  K 3.8 3.4* 4.2 3.7 3.6  -Replete with po Kcl 40 mEQ Packet given that she is about to get IV Lasix  -Continue to Monitor and Replete as Necessary  -Repeat CMP in the AM    Hypophosphatemia -Patient's Phos Level Trend: Recent Labs  Lab 11/19/22 0539 11/20/22 0542 11/21/22 0538  PHOS 1.8* 3.0 2.5  -Replete with po K Phos Neutral 500 mg po BID x2 yesterday -Continue to Monitor and Trend and Repeat Phos Level in the AM   Thrombocytopenia -Platelet Count Trend: Recent Labs  Lab 11/17/22 2138 11/18/22 0503 11/19/22 0539 11/20/22 0542 11/21/22 0844  PLT 163 142* 137* 124* 165  -Continue to Monitor for S/Sx of Bleeding; No overt bleeding noted -Repeat CBC in the AM   Normocytic Anemia -Hgb/Hct Trend: Recent Labs  Lab 11/17/22 2138 11/18/22 0503 11/19/22 0539 11/20/22 0542 11/21/22 0844  HGB 12.8 10.8* 11.3* 10.7* 10.9*  HCT 40.1 33.4* 35.6* 33.5* 34.3*  MCV 83.5 83.5 83.4 83.1 83.1  -Checked anemia panel showed an iron level of 25, UIBC of 248, TIBC of 273, saturation ratios of 9%, ferritin level 143, folate level 5.8, vitamin B12 level 261 -Will start p.o. Niferex and folic acid supplementation -Continue to Monitor S/Sx of Bleeding; No overt bleeding noted -Repeat CBC in the AM   Hypoalbuminemia -Patient's Albumin Trend: Recent Labs  Lab 11/17/22 2138 11/18/22 0503 11/19/22 0539  11/20/22 0542 11/21/22 0538  ALBUMIN 4.1 3.2* 3.4* 3.0* 3.0*  -Continue to Monitor and Trend and repeat CMP in the AM   GERD/GI Prophylaxis -C/w PPI Pantoprazole 40 mg Daily   Obesity -Complicates overall prognosis and care -Estimated body mass index is 34.18 kg/m as calculated from the following:   Height as of this encounter: '5\' 7"'$  (1.702 m).   Weight as of this encounter: 99 kg.  -Weight Loss and Dietary Counseling given  Glaucoma  -C/w Timolol 1 drop in Both Eyes BID daily and Travoprost substitution with Latanoprost 0.005% Ophthalmic Solution 1 drop Both Eyes qHS  DVT prophylaxis: enoxaparin (LOVENOX) injection 40 mg Start: 11/18/22 0800    Code Status: Full Code Family Communication: No family currently at bedside  Disposition Plan:  Level of care: Progressive Status is: Inpatient Remains inpatient appropriate because: Leg remains swollen and still edematous but is improving and anticipating discharging home in the next 24 hours   Consultants:  None  Procedures:  As delineated as above  Antimicrobials:  Anti-infectives (From admission, onward)  Start     Dose/Rate Route Frequency Ordered Stop   11/20/22 1400  ceFAZolin (ANCEF) IVPB 2g/100 mL premix        2 g 200 mL/hr over 30 Minutes Intravenous Every 8 hours 11/20/22 1027     11/19/22 2200  ceFEPIme (MAXIPIME) 2 g in sodium chloride 0.9 % 100 mL IVPB  Status:  Discontinued        2 g 200 mL/hr over 30 Minutes Intravenous Every 8 hours 11/19/22 1648 11/20/22 1017   11/18/22 1200  vancomycin (VANCOCIN) IVPB 1000 mg/200 mL premix  Status:  Discontinued        1,000 mg 200 mL/hr over 60 Minutes Intravenous Every 24 hours 11/18/22 0006 11/20/22 1017   11/18/22 1000  ceFEPIme (MAXIPIME) 2 g in sodium chloride 0.9 % 100 mL IVPB  Status:  Discontinued        2 g 200 mL/hr over 30 Minutes Intravenous Every 12 hours 11/18/22 0006 11/19/22 1648   11/17/22 2130  vancomycin (VANCOCIN) IVPB 1000 mg/200 mL premix         1,000 mg 200 mL/hr over 60 Minutes Intravenous  Once 11/17/22 2122 11/17/22 2306   11/17/22 2130  piperacillin-tazobactam (ZOSYN) IVPB 3.375 g        3.375 g 100 mL/hr over 30 Minutes Intravenous  Once 11/17/22 2122 11/17/22 2306       Subjective: Seen and examined at bedside she thinks her leg is doing little bit better.  Was complaining of some left arm brachial pain.  Denies any chest pain or shortness of breath.  No other concerns or complaints at this time.  Objective: Vitals:   11/20/22 2125 11/21/22 0546 11/21/22 0555 11/21/22 1246  BP: 117/71 110/64  128/79  Pulse: 98 74  82  Resp: '18 17  20  '$ Temp: 98.6 F (37 C) 98.7 F (37.1 C)  97.9 F (36.6 C)  TempSrc: Oral Oral  Oral  SpO2: 100% 98%  100%  Weight:   99 kg   Height:        Intake/Output Summary (Last 24 hours) at 11/21/2022 1345 Last data filed at 11/21/2022 1202 Gross per 24 hour  Intake 1040 ml  Output 3200 ml  Net -2160 ml   Filed Weights   11/19/22 0647 11/20/22 0600 11/21/22 0555  Weight: 99.8 kg 98.1 kg 99 kg   Examination: Physical Exam:  Constitutional: WN/WD obese African-American female currently no acute distress appears calm and appears little bit better than yesterday but has some left arm pain. Respiratory: Diminished to auscultation bilaterally with coarse breath sounds, no wheezing, rales, rhonchi or crackles. Normal respiratory effort and patient is not tachypenic. No accessory muscle use.  Unlabored breathing and not wearing supplemental oxygen nasal cannula Cardiovascular: RRR, no murmurs / rubs / gallops. S1 and S2 auscultated.  Has 1+ lower extremity pitting edema worse on the right compared to left with some erythema and warmth Abdomen: Soft, non-tender, distended secondary body habitus. Bowel sounds positive.  GU: Deferred. Musculoskeletal: No clubbing / cyanosis of digits/nails. No joint deformity upper and lower extremities.  Skin: Right leg swelling and erythema and some slight  warmth improving Neurologic: CN 2-12 grossly intact with no focal deficits. Romberg sign and cerebellar reflexes not assessed.  Psychiatric: Normal judgment and insight. Alert and oriented x 3. Normal mood and appropriate affect.   Data Reviewed: I have personally reviewed following labs and imaging studies  CBC: Recent Labs  Lab 11/17/22 2138 11/18/22 0503 11/19/22 0539 11/20/22  0542 11/21/22 0844  WBC 12.8* 13.1* 11.0* 9.0 8.0  NEUTROABS 11.8* 12.0* 9.4* 7.3 5.8  HGB 12.8 10.8* 11.3* 10.7* 10.9*  HCT 40.1 33.4* 35.6* 33.5* 34.3*  MCV 83.5 83.5 83.4 83.1 83.1  PLT 163 142* 137* 124* [id number]   Basic Metabolic Panel: Recent Labs  Lab 11/17/22 2138 11/17/22 2317 11/18/22 0503 11/19/22 0539 11/20/22 0542 11/21/22 0538  NA 138  --  138 139 135 136  K 3.8  --  3.4* 4.2 3.7 3.6  CL 103  --  108 108 106 106  CO2 26  --  '22 22 23 23  '$ GLUCOSE 206*  --  210* 111* 134* 161*  BUN 17  --  '15 11 14 15  '$ CREATININE 1.14*  --  1.22* 0.94 0.83 0.84  CALCIUM 10.0  --  9.4 9.8 9.0 9.3  MG  --  2.0 2.1 2.1 2.1 2.1  PHOS  --   --   --  1.8* 3.0 2.5   GFR: Estimated Creatinine Clearance: 73.2 mL/min (by C-G formula based on SCr of 0.84 mg/dL). Liver Function Tests: Recent Labs  Lab 11/17/22 2138 11/18/22 0503 11/19/22 0539 11/20/22 0542 11/21/22 0538  AST '18 29 21 24 22  '$ ALT '15 18 20 22 22  '$ ALKPHOS 76 59 75 76 98  BILITOT 1.0 0.8 0.6 0.4 0.6  PROT 7.9 6.2* 6.7 6.2* 6.8  ALBUMIN 4.1 3.2* 3.4* 3.0* 3.0*   No results for input(s): "LIPASE", "AMYLASE" in the last 168 hours. No results for input(s): "AMMONIA" in the last 168 hours. Coagulation Profile: Recent Labs  Lab 11/17/22 2138  INR 1.0   Cardiac Enzymes: No results for input(s): "CKTOTAL", "CKMB", "CKMBINDEX", "TROPONINI" in the last 168 hours. BNP (last 3 results) No results for input(s): "PROBNP" in the last 8760 hours. HbA1C: No results for input(s): "HGBA1C" in the last 72 hours. CBG: Recent Labs  Lab  11/20/22 1122 11/20/22 1720 11/20/22 2124 11/21/22 0826 11/21/22 1119  GLUCAP 187* 103* 287* 194* 155*   Lipid Profile: No results for input(s): "CHOL", "HDL", "LDLCALC", "TRIG", "CHOLHDL", "LDLDIRECT" in the last 72 hours. Thyroid Function Tests: No results for input(s): "TSH", "T4TOTAL", "FREET4", "T3FREE", "THYROIDAB" in the last 72 hours. Anemia Panel: Recent Labs    11/21/22 0538  VITAMINB12 261  FOLATE 5.8*  FERRITIN 143  TIBC 273  IRON 25*  RETICCTPCT 0.7   Sepsis Labs: Recent Labs  Lab 11/17/22 2138 11/17/22 2313  LATICACIDVEN 1.4 1.5   Recent Results (from the past 240 hour(s))  Blood Culture (routine x 2)     Status: None (Preliminary result)   Collection Time: 11/17/22  9:38 PM   Specimen: BLOOD LEFT ARM  Result Value Ref Range Status   Specimen Description   Final    BLOOD LEFT ARM Performed at [facility] [address] [place], [place] [address]    Special Requests   Final    BOTTLES DRAWN AEROBIC AND ANAEROBIC Blood Culture adequate volume Performed at [facility] [address], [place], [place] [address]    Culture   Final    NO GROWTH 4 DAYS Performed at [facility], [place] [address], [place], [place] [address]    Report Status PENDING  Incomplete  Blood Culture (routine x 2)     Status: None (Preliminary result)   Collection Time: 11/17/22  9:59 PM   Specimen: BLOOD  Result Value Ref Range Status   Specimen Description   Final  BLOOD BLOOD LEFT ARM Performed at [facility] [address] [place], [place] [address]    Special Requests   Final    BOTTLES DRAWN AEROBIC AND ANAEROBIC Blood Culture adequate volume Performed at [facility] [address] [place], [place] [address]    Culture   Final    NO GROWTH 4 DAYS Performed at [facility], [place] [address] [place], [place] [address]    Report Status PENDING  Incomplete  Resp panel by RT-PCR (RSV,  Flu A&B, Covid) Anterior Nasal Swab     Status: None   Collection Time: 11/17/22  9:59 PM   Specimen: Anterior Nasal Swab  Result Value Ref Range Status   SARS Coronavirus 2 by RT PCR NEGATIVE NEGATIVE Final    Comment: (NOTE) SARS-CoV-2 target nucleic acids are NOT DETECTED.  The SARS-CoV-2 RNA is generally detectable in upper respiratory specimens during the acute phase of infection. The lowest concentration of SARS-CoV-2 viral copies this assay can detect is 138 copies/mL. A negative result does not preclude SARS-Cov-2 infection and should not be used as the sole basis for treatment or other patient management decisions. A negative result may occur with  improper specimen collection/handling, submission of specimen other than nasopharyngeal swab, presence of viral mutation(s) within the areas targeted by this assay, and inadequate number of viral copies(<138 copies/mL). A negative result must be combined with clinical observations, patient history, and epidemiological information. The expected result is Negative.  Fact Sheet for Patients:  EntrepreneurPulse.com.au  Fact Sheet for Healthcare Providers:  IncredibleEmployment.be  This test is no t yet approved or cleared by the [place] FDA and  has been authorized for detection and/or diagnosis of SARS-CoV-2 by FDA under an Emergency Use Authorization (EUA). This EUA will remain  in effect (meaning this test can be used) for the duration of the COVID-19 declaration under Section 564(b)(1) of the Act, 21 U.S.C.section 360bbb-3(b)(1), unless the authorization is terminated  or revoked sooner.       Influenza A by PCR NEGATIVE NEGATIVE Final   Influenza B by PCR NEGATIVE NEGATIVE Final    Comment: (NOTE) The Xpert Xpress SARS-CoV-2/FLU/RSV plus assay is intended as an aid in the diagnosis of influenza from Nasopharyngeal swab specimens and should not be used as a sole basis for  treatment. Nasal washings and aspirates are unacceptable for Xpert Xpress SARS-CoV-2/FLU/RSV testing.  Fact Sheet for Patients: EntrepreneurPulse.com.au  Fact Sheet for Healthcare Providers: IncredibleEmployment.be  This test is not yet approved or cleared by the [place] FDA and has been authorized for detection and/or diagnosis of SARS-CoV-2 by FDA under an Emergency Use Authorization (EUA). This EUA will remain in effect (meaning this test can be used) for the duration of the COVID-19 declaration under Section 564(b)(1) of the Act, 21 U.S.C. section 360bbb-3(b)(1), unless the authorization is terminated or revoked.     Resp Syncytial Virus by PCR NEGATIVE NEGATIVE Final    Comment: (NOTE) Fact Sheet for Patients: EntrepreneurPulse.com.au  Fact Sheet for Healthcare Providers: IncredibleEmployment.be  This test is not yet approved or cleared by the [place] FDA and has been authorized for detection and/or diagnosis of SARS-CoV-2 by FDA under an Emergency Use Authorization (EUA). This EUA will remain in effect (meaning this test can be used) for the duration of the COVID-19 declaration under Section 564(b)(1) of the Act, 21 U.S.C. section 360bbb-3(b)(1), unless the authorization is terminated or revoked.  Performed at [facility], [place]  [address], [place], [place] [address]     Radiology Studies: No results found.  Scheduled Meds:  aspirin  81 mg Oral Daily   enoxaparin (LOVENOX) injection  40 mg Subcutaneous Q24H   famotidine  40 mg Oral QHS   fluticasone  2 spray Each Nare Daily   furosemide  40 mg Oral Daily   insulin aspart  0-9 Units Subcutaneous TID WC   latanoprost  1 drop Both Eyes QHS   loratadine  10 mg Oral Daily   pantoprazole  40 mg Oral Daily   rosuvastatin  40 mg Oral Daily   timolol  1 drop Both Eyes BID   Continuous Infusions:   ceFAZolin (ANCEF)  IV 2 g (11/21/22 1306)    LOS: 4 days   Raiford Noble, DO [facility] Available via Epic secure chat 7am-7pm After these hours, please refer to coverage provider listed on [email or web address] 11/21/2022, 1:45 PM

## 2022-11-22 DIAGNOSIS — J309 Allergic rhinitis, unspecified: Secondary | ICD-10-CM | POA: Diagnosis not present

## 2022-11-22 DIAGNOSIS — L03115 Cellulitis of right lower limb: Secondary | ICD-10-CM | POA: Diagnosis not present

## 2022-11-22 DIAGNOSIS — E119 Type 2 diabetes mellitus without complications: Secondary | ICD-10-CM | POA: Diagnosis not present

## 2022-11-22 DIAGNOSIS — I5032 Chronic diastolic (congestive) heart failure: Secondary | ICD-10-CM | POA: Diagnosis not present

## 2022-11-22 LAB — COMPREHENSIVE METABOLIC PANEL
ALT: 16 U/L (ref 0–44)
AST: 20 U/L (ref 15–41)
Albumin: 2.9 g/dL — ABNORMAL LOW (ref 3.5–5.0)
Alkaline Phosphatase: 91 U/L (ref 38–126)
Anion gap: 9 (ref 5–15)
BUN: 16 mg/dL (ref 8–23)
CO2: 26 mmol/L (ref 22–32)
Calcium: 9.6 mg/dL (ref 8.9–10.3)
Chloride: 100 mmol/L (ref 98–111)
Creatinine, Ser: 0.62 mg/dL (ref 0.44–1.00)
GFR, Estimated: >60 mL/min (ref >60)
Glucose, Bld: 200 mg/dL — ABNORMAL HIGH (ref 70–99)
Potassium: 3.5 mmol/L (ref 3.5–5.1)
Sodium: 135 mmol/L (ref 135–145)
Total Bilirubin: 0.5 mg/dL (ref 0.3–1.2)
Total Protein: 6.9 g/dL (ref 6.5–8.1)

## 2022-11-22 LAB — CULTURE, BLOOD (ROUTINE X 2)
Culture: NO GROWTH
Culture: NO GROWTH
Special Requests: ADEQUATE
Special Requests: ADEQUATE

## 2022-11-22 LAB — CBC WITH DIFFERENTIAL/PLATELET
Abs Immature Granulocytes: 0.05 10*3/uL (ref 0.00–0.07)
Basophils Absolute: 0 10*3/uL (ref 0.0–0.1)
Basophils Relative: 0 %
Eosinophils Absolute: 0.3 10*3/uL (ref 0.0–0.5)
Eosinophils Relative: 3 %
HCT: 33.2 % — ABNORMAL LOW (ref 36.0–46.0)
Hemoglobin: 10.7 g/dL — ABNORMAL LOW (ref 12.0–15.0)
Immature Granulocytes: 1 %
Lymphocytes Relative: 17 %
Lymphs Abs: 1.3 10*3/uL (ref 0.7–4.0)
MCH: 26.4 pg (ref 26.0–34.0)
MCHC: 32.2 g/dL (ref 30.0–36.0)
MCV: 81.8 fL (ref 80.0–100.0)
Monocytes Absolute: 0.7 10*3/uL (ref 0.1–1.0)
Monocytes Relative: 9 %
Neutro Abs: 5.4 10*3/uL (ref 1.7–7.7)
Neutrophils Relative %: 70 %
Platelets: 191 10*3/uL (ref 150–400)
RBC: 4.06 MIL/uL (ref 3.87–5.11)
RDW: 15.2 % (ref 11.5–15.5)
WBC: 7.7 10*3/uL (ref 4.0–10.5)
nRBC: 0 % (ref 0.0–0.2)

## 2022-11-22 LAB — MAGNESIUM: Magnesium: 2.1 mg/dL (ref 1.7–2.4)

## 2022-11-22 LAB — PHOSPHORUS: Phosphorus: 2.9 mg/dL (ref 2.5–4.6)

## 2022-11-22 LAB — GLUCOSE, CAPILLARY
Glucose-Capillary: 175 mg/dL — ABNORMAL HIGH (ref 70–99)
Glucose-Capillary: 244 mg/dL — ABNORMAL HIGH (ref 70–99)

## 2022-11-22 MED ORDER — AMOXICILLIN-POT CLAVULANATE 875-125 MG PO TABS: 1.0000 | ORAL_TABLET | Freq: Two times a day (BID) | ORAL | Status: DC

## 2022-11-22 MED ORDER — FOLIC ACID 1 MG PO TABS: 1.0000 mg | ORAL_TABLET | Freq: Every day | ORAL | 0 refills | Status: DC

## 2022-11-22 MED ORDER — POTASSIUM CHLORIDE 20 MEQ PO PACK
40.0000 meq | PACK | Freq: Two times a day (BID) | ORAL | Status: DC
  Administered 2022-11-22: 40 meq via ORAL
  Filled 2022-11-22: qty 2

## 2022-11-22 MED ORDER — POTASSIUM CHLORIDE 20 MEQ PO PACK: 40.0000 meq | PACK | Freq: Two times a day (BID) | ORAL | Status: DC

## 2022-11-22 MED ORDER — POTASSIUM CHLORIDE CRYS ER 20 MEQ PO TBCR
40.0000 meq | EXTENDED_RELEASE_TABLET | Freq: Two times a day (BID) | ORAL | Status: DC
  Filled 2022-11-22: qty 2

## 2022-11-22 MED ORDER — AMOXICILLIN-POT CLAVULANATE 875-125 MG PO TABS: 1.0000 | ORAL_TABLET | Freq: Two times a day (BID) | ORAL | 0 refills | Status: AC

## 2022-11-22 MED ORDER — POLYSACCHARIDE IRON COMPLEX 150 MG PO CAPS: 150.0000 mg | ORAL_CAPSULE | Freq: Every day | ORAL | 0 refills | Status: DC

## 2022-11-22 NOTE — Progress Notes (Signed)
Patient discharged safely. Sister will transport her home.

## 2022-11-22 NOTE — Plan of Care (Signed)

## 2022-11-22 NOTE — Progress Notes (Signed)
AVS instructions were reviewed with patient, and patient verbalizes understanding. All questions were answered.

## 2022-11-22 NOTE — TOC Transition Note (Signed)
Transition of Care [facility]) - CM/SW Discharge Note   Patient Details  Name: YOCHEVED OLIVOS MRN: [id number] Date of Birth: Sep 21, 1950  Transition of Care [facility]) CM/SW Contact:  Dessa Phi, RN Phone Number: 11/22/2022, 1:56 PM   Clinical Narrative: spoke to patient about d/c plans. Home,has pcp,pharmacy,has script coverage,has transport home. Referralf or med asst-has script coverage-has obligated co pay.No further CM needs.      Final next level of care: Home/Self Care Barriers to Discharge: No Barriers Identified   Patient Goals and CMS Choice      Discharge Placement                         Discharge Plan and Services Additional resources added to the After Visit Summary for                                       Social Determinants of Health (SDOH) Interventions SDOH Screenings   Food Insecurity: No Food Insecurity (11/18/2022)  Housing: Low Risk  (11/18/2022)  Transportation Needs: No Transportation Needs (11/18/2022)  Utilities: Not At Risk (11/18/2022)  Tobacco Use: Low Risk  (11/18/2022)     Readmission Risk Interventions     No data to display

## 2022-11-22 NOTE — Discharge Summary (Signed)
Physician Discharge Summary   Patient: Jessica Yates MRN: [id number] DOB: 1951-09-09  Admit date:     11/17/2022  Discharge date: 11/22/2022  Discharge Physician: Raiford Noble, DO   PCP: Lucianne Lei, MD   Recommendations at discharge:   Follow-up with PCP within 1 to 2 weeks and repeat CBC, CMP, mag, Phos within 1 week  Discharge Diagnoses: Principal Problem:   Cellulitis of right lower extremity Active Problems:   Allergic rhinitis   GERD (gastroesophageal reflux disease)   Sepsis (HCC)   Chronic diastolic CHF (congestive heart failure) (HCC)   DM2 (diabetes mellitus, type 2) ([place])  Resolved Problems:   * No resolved hospital problems. [facility] Course: The Patient is a 72 year old obese African-American female with past medical history significant for but not limited to chronic diastolic CHF, diabetes mellitus type 2, left lower extremity DVT in 2002, right lower extremity DVT in 2019 as well as other comorbidities who presents to [facility] complaining of right lower extremity swelling and erythema.  She started having progressive right lower extremity erythema and noticed that it progressively got worse and was more tender to touch and had more swelling.  She had no associated drainage and no acute numbness, paresthesias.  Because of her worsening leg she presented for further evaluation and has had cellulitis in the past.  Upon admission she was found to be septic from the Right Lower Extremity Cellulitis.  Remains swollen but cellulitis is slowly improving and is not as erythematous or warm.  She needs to continue to elevate it.  Will continue IV vancomycin and IV cefepime and she is febrile the night before last but is now improved.  Will de-escalate antibiotics to IV cefazolin given that she is improving.  Legs remain swollen so we will give her dose of IV Lasix today and resume home Lasix in the AM.   She steadily improved and received IV Lasix yesterday.  Arm improved as  well.  She is medically stable for discharge and will need to follow-up with PCP within 1 to 2 weeks and continue Augmentin to complete the 10-day course of antibiotics.  Assessment and Plan:  Sepsis 2/2 Severe Cellulitis of Right Lower Extremity, slowly improving -Met SIRS criteria on Admission with Fever, Leukocytosis, Tachycardia and Source of Infection with right lower extremity erythema and tenderness; he was febrile few days ago but fever seems to have improved and resolved -DG x-ray of the leg shows no evidence of acute osseous abnormality and she does not have any acute purulent discharge -WBC Trend and Lactic Acid Trend Recent Labs  Lab 11/17/22 2138 11/17/22 2313 11/18/22 0503 11/19/22 0539 11/20/22 0542 11/21/22 0844 11/22/22 0419  WBC 12.8*  --  13.1* 11.0* 9.0 8.0 7.7  LATICACIDVEN 1.4 1.5  --   --   --   --   --   -Patient did not receive the 30 mL/kg IV fluid boluses given underlying CHF Blood cultures x 2 obtained prior to initiation of broad-spectrum antibiotics of IV vancomycin and Zosyn in the ED; Blood Cx x2 showed NGTD at 5 Days  -Continued IV vancomycin and IV cefepime but will de-escalate to IV Cefazolin and change to Augmentin for D/C -Continue monitor for signs and symptoms infection follow cultures and temperature curve -Lower extremity venous duplex negative for DVT but legs still swollen -Continue to monitor on telemetry and continue supportive care with acetaminophen for fevers -PT OT recommending no follow-up   Chronic Diastolic CHF -Most recent echocardiogram was  done in December 2022 with an LVEF of 70 to 75% with indeterminate diastolic parameters as well as mildly reduced right ventricular systolic function -BNP on Admission was 155 -Currently does not appear clinically decompensated but does have leg swelling in the setting of Cellulitis (Right worse than Left)   -Resumed home regimen of oral Lasix 40 mg  -Strict I's and O's and daily weights   Intake/Output Summary (Last 24 hours) at 11/22/2022 1706 Last data filed at 11/22/2022 1130 Gross per 24 hour  Intake 711.11 ml  Output 1400 ml  Net -688.89 ml   -Patient is -3.652 L since admission -Continue monitor for signs and symptoms of volume overload   Diabetes Mellitus Type 2 -Continue to hold her oral hypoglycemic agents including Dapagliflozin -Last hemoglobin A1c was 6.9 in August 2020 -Add sensitive NovoLog/scale insulin before meals and at bedtime -Hemoglobin A1c was 7.1 -Continue monitor CBGs per protocol and if necessary will adjust insulin regimen as necessary -CBGs ranging from 154-244   Left arm phlebitis -In the setting where her IV was -Continue with IV antibiotics with IV cefazolin and then compresses and if necessary will obtain ultrasound   Allergic Rhinitis -Continue home Fluticasone 2 spray Each Nare Daily and Loratadine 10 mg po Daily   HLD -C/w Rosuvastatin 40 mg po Daily    Hypokalemia -K+ Trend: Recent Labs  Lab 11/17/22 2138 11/18/22 0503 11/19/22 0539 11/20/22 0542 11/21/22 0538 11/22/22 0419  K 3.8 3.4* 4.2 3.7 3.6 3.5  -Replete with po Kcl 40 mEQ Packet prior to D/C  -Continue to Monitor and Replete as Necessary  -Repeat CMP within 1 week    Hypophosphatemia -Patient's Phos Level Trend: Recent Labs  Lab 11/19/22 0539 11/20/22 0542 11/21/22 0538 11/22/22 0419  PHOS 1.8* 3.0 2.5 2.9  -Continue to Monitor and Trend and Repeat Phos Level within 1 week   Thrombocytopenia -Platelet Count Trend: Recent Labs  Lab 11/17/22 2138 11/18/22 0503 11/19/22 0539 11/20/22 0542 11/21/22 0844 11/22/22 0419  PLT 163 142* 137* 124* 165 191  -Continue to Monitor for S/Sx of Bleeding; No overt bleeding noted -Repeat CBC in the AM   Normocytic Anemia -Hgb/Hct Trend: Recent Labs  Lab 11/17/22 2138 11/18/22 0503 11/19/22 0539 11/20/22 0542 11/21/22 0844 11/22/22 0419  HGB 12.8 10.8* 11.3* 10.7* 10.9* 10.7*  HCT 40.1 33.4* 35.6*  33.5* 34.3* 33.2*  MCV 83.5 83.5 83.4 83.1 83.1 81.8  -Checked anemia panel showed an iron level of 25, UIBC of 248, TIBC of 273, saturation ratios of 9%, ferritin level 143, folate level 5.8, vitamin B12 level 261 -Will start p.o. Niferex and folic acid supplementation -Continue to Monitor S/Sx of Bleeding; No overt bleeding noted -Repeat CBC within 1 week   Hypoalbuminemia -Patient's Albumin Trend: Recent Labs  Lab 11/17/22 2138 11/18/22 0503 11/19/22 0539 11/20/22 0542 11/21/22 0538 11/22/22 0419  ALBUMIN 4.1 3.2* 3.4* 3.0* 3.0* 2.9*  -Continue to Monitor and Trend and repeat CMP in the AM   GERD/GI Prophylaxis -C/w PPI Pantoprazole 40 mg Daily   Obesity -Complicates overall prognosis and care -Estimated body mass index is 33.77 kg/m as calculated from the following:   Height as of this encounter: '5\' 7"'$  (1.702 m).   Weight as of this encounter: 97.8 kg.  -Weight Loss and Dietary Counseling given   Glaucoma  -C/w Timolol 1 drop in Both Eyes BID daily and Travoprost substitution with Latanoprost 0.005% Ophthalmic Solution 1 drop Both Eyes qHS while hospitalized   Consultants: None Procedures performed: As  delineated as above Disposition: Home Diet recommendation:  Cardiac and Carb modified diet DISCHARGE MEDICATION: Allergies as of 11/22/2022   No Known Allergies      Medication List     TAKE these medications    acetaminophen 500 MG tablet Commonly known as: TYLENOL Take 1,000 mg by mouth every 6 (six) hours as needed for mild pain.   albuterol 108 (90 Base) MCG/ACT inhaler Commonly known as: VENTOLIN HFA Inhale 2 puffs into the lungs every 6 (six) hours as needed for wheezing or shortness of breath.   amoxicillin-clavulanate 875-125 MG tablet Commonly known as: AUGMENTIN Take 1 tablet by mouth every 12 (twelve) hours for 5 days.   aspirin 81 MG chewable tablet Chew 81 mg by mouth at bedtime.   benzonatate 200 MG capsule Commonly known as:  TESSALON Take 200 mg by mouth 3 (three) times daily as needed for cough.   blood glucose meter kit and supplies Kit Dispense based on patient and insurance preference. Use up to four times daily as directed. (FOR ICD-9 250.00, 250.01).   cetirizine 10 MG tablet Commonly known as: ZYRTEC Take 1 tablet (10 mg total) by mouth daily.   famotidine 40 MG tablet Commonly known as: PEPCID TAKE 1 TABLET BY MOUTH EVERYDAY AT BEDTIME What changed: See the new instructions.   Farxiga 10 MG Tabs tablet Generic drug: dapagliflozin propanediol Take 10 mg by mouth daily before breakfast.   fluticasone 50 MCG/ACT nasal spray Commonly known as: FLONASE Place 2 sprays into both nostrils daily. What changed:  when to take this reasons to take this   folic acid 1 MG tablet Commonly known as: FOLVITE Take 1 tablet (1 mg total) by mouth daily. Start taking on: November 23, 2022   furosemide 40 MG tablet Commonly known as: LASIX Take 1 tablet by mouth twice a day X's 3 days then go down to 1 tablet by mouth daily What changed:  how much to take how to take this when to take this additional instructions   Gemtesa 75 MG Tabs Generic drug: Vibegron Take 75 mg by mouth daily.   iron polysaccharides 150 MG capsule Commonly known as: NIFEREX Take 1 capsule (150 mg total) by mouth daily. Start taking on: November 23, 2022   ketoconazole 2 % cream Commonly known as: NIZORAL Apply to both feet and between toes once daily for 6 weeks.   losartan 25 MG tablet Commonly known as: COZAAR Take 12.5 mg by mouth See admin instructions. Take 12.5 mg by mouth only when taking Furosemide   RABEprazole 20 MG tablet Commonly known as: ACIPHEX Take 1 tablet (20 mg total) by mouth 2 (two) times daily. Take one tablet before breakfast, and one tablet before dinner. Please call [phone] to schedule an office visit for more refills What changed:  when to take this additional instructions   rosuvastatin 40  MG tablet Commonly known as: CRESTOR TAKE 1 TABLET BY MOUTH EVERY DAY What changed: when to take this   timolol 0.5 % ophthalmic solution Commonly known as: TIMOPTIC Place 1 drop into both eyes See admin instructions. Instill 1 drop into both eyes in the morning and evening   Travatan Z 0.004 % Soln ophthalmic solution Generic drug: Travoprost (BAK Free) Place 1 drop into both eyes at bedtime.   Vitamin D3 25 MCG (1000 UT) capsule Generic drug: Cholecalciferol Take 1,000 Units by mouth 3 (three) times a week.        Discharge Exam: [employer]  11/20/22 0600 11/21/22 0555 11/22/22 0620  Weight: 98.1 kg 99 kg 97.8 kg   Vitals:   11/22/22 0505 11/22/22 1328  BP: 104/88 123/75  Pulse: 78 84  Resp: 17 18  Temp: 98.5 F (36.9 C) 98.5 F (36.9 C)  SpO2: 97% 100%   Examination: Physical Exam:  Constitutional: WN/WD obese African-American female, in no acute distress Respiratory: Diminished to auscultation bilaterally, no wheezing, rales, rhonchi or crackles. Normal respiratory effort and patient is not tachypenic. No accessory muscle use.  Unlabored breathing Cardiovascular: RRR, no murmurs / rubs / gallops. S1 and S2 auscultated.  Has 1+ lower extremity edema on the right Abdomen: Soft, non-tender, distended secondary body habitus.  Bowel sounds positive.  GU: Deferred. Musculoskeletal: No clubbing / cyanosis of digits/nails. No joint deformity upper and lower extremities.  Skin: Right leg is a little swollen and not as erythematous.  Not as warm to palpate and cellulitic appearance is improving. Neurologic: CN 2-12 grossly intact with no focal deficits. Romberg sign and cerebellar reflexes not assessed.  Psychiatric: Normal judgment and insight. Alert and oriented x 3. Normal mood and appropriate affect.   Condition at discharge: stable  The results of significant diagnostics from this hospitalization (including imaging, microbiology, ancillary and laboratory) are  listed below for reference.   Imaging Studies: VAS [place] LOWER EXTREMITY VENOUS (DVT)  Result Date: 11/18/2022  Lower Venous DVT Study Patient Name:  Jessica Yates [facility]  Date of Exam:   11/18/2022 Medical Rec #: [id number]          Accession #:    [id number] Date of Birth: 02-02-1951          Patient Gender: F Patient Age:   75 years Exam Location:  [facility] Procedure:      VAS [place] LOWER EXTREMITY VENOUS (DVT) Referring Phys: Babs Bertin --------------------------------------------------------------------------------  Indications: Pain, Swelling, and Erythema (cellulitis).  Risk Factors: HX of DVT. Limitations: Poor ultrasound/tissue interface. Comparison Study: Previous exam (reflux) on 07/24/21 was negative for DVT Performing Technologist: Rogelia Rohrer RVT, RDMS  Examination Guidelines: A complete evaluation includes B-mode imaging, spectral Doppler, color Doppler, and power Doppler as needed of all accessible portions of each vessel. Bilateral testing is considered an integral part of a complete examination. Limited examinations for reoccurring indications may be performed as noted. The reflux portion of the exam is performed with the patient in reverse Trendelenburg.  +---------+---------------+---------+-----------+----------+-------------------+ RIGHT    CompressibilityPhasicitySpontaneityPropertiesThrombus Aging      +---------+---------------+---------+-----------+----------+-------------------+ CFV      Full           Yes      Yes                                      +---------+---------------+---------+-----------+----------+-------------------+ SFJ      Full                                                             +---------+---------------+---------+-----------+----------+-------------------+ FV Prox  Full           Yes      Yes                                      +---------+---------------+---------+-----------+----------+-------------------+  FV Mid   Full            Yes      Yes                                      +---------+---------------+---------+-----------+----------+-------------------+ FV DistalFull           Yes      Yes                                      +---------+---------------+---------+-----------+----------+-------------------+ PFV      Full                                                             +---------+---------------+---------+-----------+----------+-------------------+ POP      Full           Yes      Yes                                      +---------+---------------+---------+-----------+----------+-------------------+ PTV      Full                                         Not well visualized +---------+---------------+---------+-----------+----------+-------------------+ PERO     Full                                         Not well visualized +---------+---------------+---------+-----------+----------+-------------------+   +----+---------------+---------+-----------+----------+--------------+ LEFTCompressibilityPhasicitySpontaneityPropertiesThrombus Aging +----+---------------+---------+-----------+----------+--------------+ CFV Full           Yes      Yes                                 +----+---------------+---------+-----------+----------+--------------+     Summary: RIGHT: - There is no evidence of deep vein thrombosis in the lower extremity.  - No cystic structure found in the popliteal fossa. Subcutaneous edema seen in area of calf and ankle.  LEFT: - No evidence of common femoral vein obstruction.  *See table(s) above for measurements and observations. Electronically signed by Orlie Pollen on 11/18/2022 at 4:22:29 PM.    Final    CT Angio Chest PE W and/or Wo Contrast  Result Date: 11/17/2022 CLINICAL DATA:  Right lower extremity pain and swelling with coughing, shortness of breath and prior history of DVT. EXAM: CT ANGIOGRAPHY CHEST WITH CONTRAST TECHNIQUE: Multidetector CT  imaging of the chest was performed using the standard protocol during bolus administration of intravenous contrast. Multiplanar CT image reconstructions and MIPs were obtained to evaluate the vascular anatomy. RADIATION DOSE REDUCTION: This exam was performed according to the departmental dose-optimization program which includes automated exposure control, adjustment of the mA and/or kV according to patient size and/or use of iterative reconstruction technique. CONTRAST:  29m OMNIPAQUE IOHEXOL 350 MG/ML SOLN COMPARISON:  Portable chest today, PA and lateral  chest 01/01/2022, chest CT without contrast 03/22/2022, and CTA chest 12/28/2015. FINDINGS: Cardiovascular: Mild panchamber cardiomegaly. No pericardial effusion. The pulmonary veins mildly distended but no more than previously. There is chronic prominence of the pulmonary trunk which measures 3.3 cm indicating arterial hypertension. No arterial embolic filling defect is seen. There is mild aortic atherosclerosis and tortuosity without aneurysm, stenosis or dissection. The great vessels are clear with tortuosity. There is scattered calcification LAD coronary artery only. Mediastinum/Nodes: There is no intrathoracic or axillary adenopathy. There is no thyroid mass. There is retained versus refluxed fluid with in the distal thoracic esophagus but no esophageal wall thickening no hiatal hernia. The trachea and central airways are clear. Lungs/Pleura: Chronic biapical pleural-parenchymal scarring. Rounded atelectasis again noted in the posterior basal right lower lobe. There are scattered bilateral basal linear scar-like opacities. There is no pleural effusion or pneumothorax. There is artifact from respiratory motion. No focal pneumonia or mass is seen. Upper Abdomen: No acute abnormality. Musculoskeletal: Moderate to severe thoracic kyphosis approaching 90 degrees kyphotic angle. Mild chronic wedging in the midthoracic spine is unchanged. Osteopenia and thoracic  spondylosis. No acute or other significant osseous findings. No chest wall mass is seen. Review of the MIP images confirms the above findings. IMPRESSION: 1. No acute chest CT or CTA findings. 2. Cardiomegaly with mildly distended pulmonary veins but no more than previously. 3. Chronic prominence of the pulmonary trunk indicating arterial hypertension. 4. Aortic and coronary artery atherosclerosis. 5. Retained versus refluxed fluid in the distal thoracic esophagus. 6. Osteopenia and degenerative change with moderate to severe thoracic kyphosis. Aortic Atherosclerosis (ICD10-I70.0). Electronically Signed   By: Telford Nab M.D.   On: 11/17/2022 23:01   DG Tibia/Fibula Right Port  Result Date: 11/17/2022 CLINICAL DATA:  Right leg pain and swelling, initial encounter EXAM: PORTABLE RIGHT TIBIA AND FIBULA - 2 VIEW COMPARISON:  None Available. FINDINGS: Degenerative changes of the knee joint are seen. No acute fracture or dislocation is noted. No soft tissue abnormality is seen. IMPRESSION: No acute abnormality noted. Electronically Signed   By: Inez Catalina M.D.   On: 11/17/2022 22:27   DG Chest Port 1 View  Result Date: 11/17/2022 CLINICAL DATA:  Questionable sepsis. EXAM: PORTABLE CHEST 1 VIEW COMPARISON:  Chest CT dated 03/22/2022. FINDINGS: No focal consolidation, pleural effusion, pneumothorax. Top-normal cardiac size. No acute osseous pathology. IMPRESSION: No active disease. Electronically Signed   By: Anner Crete M.D.   On: 11/17/2022 21:28    Microbiology: Results for orders placed or performed during the hospital encounter of 11/17/22  Blood Culture (routine x 2)     Status: None   Collection Time: 11/17/22  9:38 PM   Specimen: BLOOD LEFT ARM  Result Value Ref Range Status   Specimen Description   Final    BLOOD LEFT ARM Performed at [facility], [address], [place], [place] [address]    Special Requests   Final    BOTTLES DRAWN AEROBIC AND ANAEROBIC Blood Culture adequate  volume Performed at [place] [address] [place], [place] [address]    Culture   Final    NO GROWTH 5 DAYS Performed at [facility], [place] [address] [place], [place] [address]    Report Status 11/22/2022 FINAL  Final  Blood Culture (routine x 2)     Status: None   Collection Time: 11/17/22  9:59 PM   Specimen: BLOOD  Result Value Ref Range Status   Specimen Description  Final    BLOOD BLOOD LEFT ARM Performed at [place] [address] [place], [place] [address]    Special Requests   Final    BOTTLES DRAWN AEROBIC AND ANAEROBIC Blood Culture adequate volume Performed at [facility] [address] [place], [place] [address]    Culture   Final    NO GROWTH 5 DAYS Performed at [facility], [place] [address] [place], [place] [address]    Report Status 11/22/2022 FINAL  Final  Resp panel by RT-PCR (RSV, Flu A&B, Covid) Anterior Nasal Swab     Status: None   Collection Time: 11/17/22  9:59 PM   Specimen: Anterior Nasal Swab  Result Value Ref Range Status   SARS Coronavirus 2 by RT PCR NEGATIVE NEGATIVE Final    Comment: (NOTE) SARS-CoV-2 target nucleic acids are NOT DETECTED.  The SARS-CoV-2 RNA is generally detectable in upper respiratory specimens during the acute phase of infection. The lowest concentration of SARS-CoV-2 viral copies this assay can detect is 138 copies/mL. A negative result does not preclude SARS-Cov-2 infection and should not be used as the sole basis for treatment or other patient management decisions. A negative result may occur with  improper specimen collection/handling, submission of specimen other than nasopharyngeal swab, presence of viral mutation(s) within the areas targeted by this assay, and inadequate number of viral copies(<138 copies/mL). A negative result must be combined with clinical observations, patient history, and epidemiological information. The  expected result is Negative.  Fact Sheet for Patients:  EntrepreneurPulse.com.au  Fact Sheet for Healthcare Providers:  IncredibleEmployment.be  This test is no t yet approved or cleared by the [place] FDA and  has been authorized for detection and/or diagnosis of SARS-CoV-2 by FDA under an Emergency Use Authorization (EUA). This EUA will remain  in effect (meaning this test can be used) for the duration of the COVID-19 declaration under Section 564(b)(1) of the Act, 21 U.S.C.section 360bbb-3(b)(1), unless the authorization is terminated  or revoked sooner.       Influenza A by PCR NEGATIVE NEGATIVE Final   Influenza B by PCR NEGATIVE NEGATIVE Final    Comment: (NOTE) The Xpert Xpress SARS-CoV-2/FLU/RSV plus assay is intended as an aid in the diagnosis of influenza from Nasopharyngeal swab specimens and should not be used as a sole basis for treatment. Nasal washings and aspirates are unacceptable for Xpert Xpress SARS-CoV-2/FLU/RSV testing.  Fact Sheet for Patients: EntrepreneurPulse.com.au  Fact Sheet for Healthcare Providers: IncredibleEmployment.be  This test is not yet approved or cleared by the [place] FDA and has been authorized for detection and/or diagnosis of SARS-CoV-2 by FDA under an Emergency Use Authorization (EUA). This EUA will remain in effect (meaning this test can be used) for the duration of the COVID-19 declaration under Section 564(b)(1) of the Act, 21 U.S.C. section 360bbb-3(b)(1), unless the authorization is terminated or revoked.     Resp Syncytial Virus by PCR NEGATIVE NEGATIVE Final    Comment: (NOTE) Fact Sheet for Patients: EntrepreneurPulse.com.au  Fact Sheet for Healthcare Providers: IncredibleEmployment.be  This test is not yet approved or cleared by the [place] FDA and has been authorized for detection and/or  diagnosis of SARS-CoV-2 by FDA under an Emergency Use Authorization (EUA). This EUA will remain in effect (meaning this test can be used) for the duration of the COVID-19 declaration under Section 564(b)(1) of the Act, 21 U.S.C. section 360bbb-3(b)(1), unless the authorization is terminated or revoked.  Performed at [facility]  [place] [address], [place], [place] [address]    Labs: CBC: Recent Labs  Lab 11/18/22 0503 11/19/22 0539 11/20/22 0542 11/21/22 0844 11/22/22 0419  WBC 13.1* 11.0* 9.0 8.0 7.7  NEUTROABS 12.0* 9.4* 7.3 5.8 5.4  HGB 10.8* 11.3* 10.7* 10.9* 10.7*  HCT 33.4* 35.6* 33.5* 34.3* 33.2*  MCV 83.5 83.4 83.1 83.1 81.8  PLT 142* 137* 124* 165 [id number]   Basic Metabolic Panel: Recent Labs  Lab 11/18/22 0503 11/19/22 0539 11/20/22 0542 11/21/22 0538 11/22/22 0419  NA 138 139 135 136 135  K 3.4* 4.2 3.7 3.6 3.5  CL 108 108 106 106 100  CO2 '22 22 23 23 26  '$ GLUCOSE 210* 111* 134* 161* 200*  BUN '15 11 14 15 16  '$ CREATININE 1.22* 0.94 0.83 0.84 0.62  CALCIUM 9.4 9.8 9.0 9.3 9.6  MG 2.1 2.1 2.1 2.1 2.1  PHOS  --  1.8* 3.0 2.5 2.9   Liver Function Tests: Recent Labs  Lab 11/18/22 0503 11/19/22 0539 11/20/22 0542 11/21/22 0538 11/22/22 0419  AST '29 21 24 22 20  '$ ALT '18 20 22 22 16  '$ ALKPHOS 59 75 76 98 91  BILITOT 0.8 0.6 0.4 0.6 0.5  PROT 6.2* 6.7 6.2* 6.8 6.9  ALBUMIN 3.2* 3.4* 3.0* 3.0* 2.9*   CBG: Recent Labs  Lab 11/21/22 1119 11/21/22 1623 11/21/22 2050 11/22/22 0731 11/22/22 1134  GLUCAP 155* 154* 244* 175* 244*   Discharge time spent: greater than 30 minutes.  Signed: Raiford Noble, DO [facility] 11/22/2022

## 2022-11-29 ENCOUNTER — Other Ambulatory Visit: Payer: Self-pay | Admitting: Physician Assistant

## 2022-12-02 ENCOUNTER — Telehealth: Payer: Self-pay | Admitting: Pharmacy Technician

## 2022-12-02 ENCOUNTER — Other Ambulatory Visit (HOSPITAL_COMMUNITY): Payer: Self-pay

## 2022-12-02 NOTE — Telephone Encounter (Signed)
Patient Advocate Encounter  Prior Authorization for RABEPRAZOLE 20MG  has been approved.    PA# [id number] Effective dates: 1.1.24 through 3.21.25   Received notification from [facility] that prior authorization for RABEPRAZOLE 20MG  is required.   PA submitted on 3.21.24 Key [id number] Status is pending

## 2022-12-07 DIAGNOSIS — L0390 Cellulitis, unspecified: Secondary | ICD-10-CM | POA: Diagnosis not present

## 2022-12-07 DIAGNOSIS — E039 Hypothyroidism, unspecified: Secondary | ICD-10-CM | POA: Diagnosis not present

## 2022-12-07 DIAGNOSIS — E1169 Type 2 diabetes mellitus with other specified complication: Secondary | ICD-10-CM | POA: Diagnosis not present

## 2022-12-07 DIAGNOSIS — L03116 Cellulitis of left lower limb: Secondary | ICD-10-CM | POA: Diagnosis not present

## 2022-12-07 DIAGNOSIS — E7800 Pure hypercholesterolemia, unspecified: Secondary | ICD-10-CM | POA: Diagnosis not present

## 2022-12-07 DIAGNOSIS — D638 Anemia in other chronic diseases classified elsewhere: Secondary | ICD-10-CM | POA: Diagnosis not present

## 2022-12-27 ENCOUNTER — Encounter (HOSPITAL_COMMUNITY): Payer: Self-pay

## 2022-12-27 ENCOUNTER — Inpatient Hospital Stay (HOSPITAL_COMMUNITY)
Admission: EM | Admit: 2022-12-27 | Discharge: 2022-12-29 | DRG: 872 | Disposition: A | Discharge status: Home / Self Care | Payer: No Typology Code available for payment source | Attending: Internal Medicine | Admitting: Internal Medicine

## 2022-12-27 DIAGNOSIS — E119 Type 2 diabetes mellitus without complications: Secondary | ICD-10-CM

## 2022-12-27 DIAGNOSIS — H409 Unspecified glaucoma: Secondary | ICD-10-CM | POA: Diagnosis present

## 2022-12-27 DIAGNOSIS — K219 Gastro-esophageal reflux disease without esophagitis: Secondary | ICD-10-CM | POA: Diagnosis not present

## 2022-12-27 DIAGNOSIS — E1122 Type 2 diabetes mellitus with diabetic chronic kidney disease: Secondary | ICD-10-CM | POA: Diagnosis not present

## 2022-12-27 DIAGNOSIS — F419 Anxiety disorder, unspecified: Secondary | ICD-10-CM | POA: Diagnosis not present

## 2022-12-27 DIAGNOSIS — D573 Sickle-cell trait: Secondary | ICD-10-CM | POA: Diagnosis not present

## 2022-12-27 DIAGNOSIS — H401131 Primary open-angle glaucoma, bilateral, mild stage: Secondary | ICD-10-CM | POA: Diagnosis not present

## 2022-12-27 DIAGNOSIS — E872 Acidosis, unspecified: Secondary | ICD-10-CM | POA: Diagnosis present

## 2022-12-27 DIAGNOSIS — L0390 Cellulitis, unspecified: Secondary | ICD-10-CM | POA: Diagnosis present

## 2022-12-27 DIAGNOSIS — Z79899 Other long term (current) drug therapy: Secondary | ICD-10-CM

## 2022-12-27 DIAGNOSIS — I5032 Chronic diastolic (congestive) heart failure: Secondary | ICD-10-CM | POA: Diagnosis not present

## 2022-12-27 DIAGNOSIS — E1165 Type 2 diabetes mellitus with hyperglycemia: Secondary | ICD-10-CM | POA: Diagnosis present

## 2022-12-27 DIAGNOSIS — Z6833 Body mass index (BMI) 33.0-33.9, adult: Secondary | ICD-10-CM | POA: Diagnosis not present

## 2022-12-27 DIAGNOSIS — F32A Depression, unspecified: Secondary | ICD-10-CM | POA: Diagnosis present

## 2022-12-27 DIAGNOSIS — I890 Lymphedema, not elsewhere classified: Secondary | ICD-10-CM | POA: Diagnosis not present

## 2022-12-27 DIAGNOSIS — N1831 Chronic kidney disease, stage 3a: Secondary | ICD-10-CM | POA: Diagnosis present

## 2022-12-27 DIAGNOSIS — Z8249 Family history of ischemic heart disease and other diseases of the circulatory system: Secondary | ICD-10-CM

## 2022-12-27 DIAGNOSIS — Z8261 Family history of arthritis: Secondary | ICD-10-CM | POA: Diagnosis not present

## 2022-12-27 DIAGNOSIS — E785 Hyperlipidemia, unspecified: Secondary | ICD-10-CM | POA: Diagnosis not present

## 2022-12-27 DIAGNOSIS — Z806 Family history of leukemia: Secondary | ICD-10-CM

## 2022-12-27 DIAGNOSIS — M7989 Other specified soft tissue disorders: Secondary | ICD-10-CM | POA: Diagnosis not present

## 2022-12-27 DIAGNOSIS — Z803 Family history of malignant neoplasm of breast: Secondary | ICD-10-CM

## 2022-12-27 DIAGNOSIS — Z83438 Family history of other disorder of lipoprotein metabolism and other lipidemia: Secondary | ICD-10-CM

## 2022-12-27 DIAGNOSIS — E669 Obesity, unspecified: Secondary | ICD-10-CM | POA: Diagnosis present

## 2022-12-27 DIAGNOSIS — Z90710 Acquired absence of both cervix and uterus: Secondary | ICD-10-CM | POA: Diagnosis not present

## 2022-12-27 DIAGNOSIS — Z7982 Long term (current) use of aspirin: Secondary | ICD-10-CM

## 2022-12-27 DIAGNOSIS — N179 Acute kidney failure, unspecified: Secondary | ICD-10-CM | POA: Diagnosis present

## 2022-12-27 DIAGNOSIS — A419 Sepsis, unspecified organism: Principal | ICD-10-CM | POA: Diagnosis present

## 2022-12-27 DIAGNOSIS — Z86718 Personal history of other venous thrombosis and embolism: Secondary | ICD-10-CM | POA: Diagnosis not present

## 2022-12-27 DIAGNOSIS — Z833 Family history of diabetes mellitus: Secondary | ICD-10-CM

## 2022-12-27 DIAGNOSIS — Z825 Family history of asthma and other chronic lower respiratory diseases: Secondary | ICD-10-CM | POA: Diagnosis not present

## 2022-12-27 DIAGNOSIS — L03116 Cellulitis of left lower limb: Secondary | ICD-10-CM | POA: Diagnosis not present

## 2022-12-27 LAB — COMPREHENSIVE METABOLIC PANEL
ALT: 17 U/L (ref 0–44)
AST: 35 U/L (ref 15–41)
Albumin: 3.9 g/dL (ref 3.5–5.0)
Alkaline Phosphatase: 86 U/L (ref 38–126)
Anion gap: 13 (ref 5–15)
BUN: 16 mg/dL (ref 8–23)
CO2: 21 mmol/L — ABNORMAL LOW (ref 22–32)
Calcium: 9.9 mg/dL (ref 8.9–10.3)
Chloride: 102 mmol/L (ref 98–111)
Creatinine, Ser: 1.27 mg/dL — ABNORMAL HIGH (ref 0.44–1.00)
GFR, Estimated: 45 mL/min — ABNORMAL LOW (ref >60)
Glucose, Bld: 179 mg/dL — ABNORMAL HIGH (ref 70–99)
Potassium: 4.2 mmol/L (ref 3.5–5.1)
Sodium: 136 mmol/L (ref 135–145)
Total Bilirubin: 1 mg/dL (ref 0.3–1.2)
Total Protein: 8.3 g/dL — ABNORMAL HIGH (ref 6.5–8.1)

## 2022-12-27 LAB — CBC WITH DIFFERENTIAL/PLATELET
Abs Immature Granulocytes: 0.19 10*3/uL — ABNORMAL HIGH (ref 0.00–0.07)
Basophils Absolute: 0 10*3/uL (ref 0.0–0.1)
Basophils Relative: 0 %
Eosinophils Absolute: 0 10*3/uL (ref 0.0–0.5)
Eosinophils Relative: 0 %
HCT: 38.6 % (ref 36.0–46.0)
Hemoglobin: 12.1 g/dL (ref 12.0–15.0)
Immature Granulocytes: 1 %
Lymphocytes Relative: 4 %
Lymphs Abs: 0.6 10*3/uL — ABNORMAL LOW (ref 0.7–4.0)
MCH: 26.5 pg (ref 26.0–34.0)
MCHC: 31.3 g/dL (ref 30.0–36.0)
MCV: 84.5 fL (ref 80.0–100.0)
Monocytes Absolute: 0.5 10*3/uL (ref 0.1–1.0)
Monocytes Relative: 3 %
Neutro Abs: 15.3 10*3/uL — ABNORMAL HIGH (ref 1.7–7.7)
Neutrophils Relative %: 92 %
Platelets: 206 10*3/uL (ref 150–400)
RBC: 4.57 MIL/uL (ref 3.87–5.11)
RDW: 16.2 % — ABNORMAL HIGH (ref 11.5–15.5)
WBC: 16.6 10*3/uL — ABNORMAL HIGH (ref 4.0–10.5)
nRBC: 0 % (ref 0.0–0.2)

## 2022-12-27 LAB — GLUCOSE, CAPILLARY: Glucose-Capillary: 153 mg/dL — ABNORMAL HIGH (ref 70–99)

## 2022-12-27 MED ORDER — FAMOTIDINE 20 MG PO TABS
40.0000 mg | ORAL_TABLET | Freq: Every day | ORAL | Status: DC
  Administered 2022-12-28 (×2): 40 mg via ORAL
  Filled 2022-12-27 (×2): qty 2

## 2022-12-27 MED ORDER — POLYETHYLENE GLYCOL 3350 17 G PO PACK: 17.0000 g | PACK | Freq: Every day | ORAL | Status: DC | PRN

## 2022-12-27 MED ORDER — SODIUM CHLORIDE 0.9 % IV SOLN: INTRAVENOUS | Status: DC

## 2022-12-27 MED ORDER — SENNOSIDES-DOCUSATE SODIUM 8.6-50 MG PO TABS
1.0000 | ORAL_TABLET | Freq: Every day | ORAL | Status: DC
  Administered 2022-12-27 – 2022-12-28 (×2): 1 via ORAL
  Filled 2022-12-27 (×2): qty 1

## 2022-12-27 MED ORDER — FLUTICASONE PROPIONATE 50 MCG/ACT NA SUSP: 2.0000 | Freq: Every day | NASAL | Status: DC | PRN

## 2022-12-27 MED ORDER — INSULIN ASPART 100 UNIT/ML IJ SOLN
0.0000 [IU] | Freq: Every day | INTRAMUSCULAR | Status: DC
  Filled 2022-12-27: qty 0.05

## 2022-12-27 MED ORDER — VANCOMYCIN HCL IN DEXTROSE 1-5 GM/200ML-% IV SOLN
1000.0000 mg | Freq: Once | INTRAVENOUS | Status: AC
  Administered 2022-12-27: 1000 mg via INTRAVENOUS
  Filled 2022-12-27: qty 200

## 2022-12-27 MED ORDER — MELATONIN 5 MG PO TABS
5.0000 mg | ORAL_TABLET | Freq: Every evening | ORAL | Status: DC | PRN
  Administered 2022-12-28: 5 mg via ORAL
  Filled 2022-12-27: qty 1

## 2022-12-27 MED ORDER — ASPIRIN 81 MG PO CHEW
81.0000 mg | CHEWABLE_TABLET | Freq: Every day | ORAL | Status: DC
  Administered 2022-12-28 (×2): 81 mg via ORAL
  Filled 2022-12-27 (×2): qty 1

## 2022-12-27 MED ORDER — VANCOMYCIN HCL IN DEXTROSE 1-5 GM/200ML-% IV SOLN
1000.0000 mg | INTRAVENOUS | Status: AC
  Administered 2022-12-28: 1000 mg via INTRAVENOUS
  Filled 2022-12-27: qty 200

## 2022-12-27 MED ORDER — PANTOPRAZOLE SODIUM 40 MG PO TBEC
40.0000 mg | DELAYED_RELEASE_TABLET | Freq: Every day | ORAL | Status: DC
  Administered 2022-12-28 – 2022-12-29 (×2): 40 mg via ORAL
  Filled 2022-12-27 (×2): qty 1

## 2022-12-27 MED ORDER — ACETAMINOPHEN 325 MG PO TABS
650.0000 mg | ORAL_TABLET | Freq: Four times a day (QID) | ORAL | Status: DC | PRN
  Administered 2022-12-28 (×2): 650 mg via ORAL
  Filled 2022-12-27 (×2): qty 2

## 2022-12-27 MED ORDER — FOLIC ACID 1 MG PO TABS
1.0000 mg | ORAL_TABLET | Freq: Every day | ORAL | Status: DC
  Administered 2022-12-28 – 2022-12-29 (×2): 1 mg via ORAL
  Filled 2022-12-27 (×2): qty 1

## 2022-12-27 MED ORDER — ROSUVASTATIN CALCIUM 20 MG PO TABS
40.0000 mg | ORAL_TABLET | Freq: Every day | ORAL | Status: DC
  Administered 2022-12-28 (×2): 40 mg via ORAL
  Filled 2022-12-27 (×2): qty 2

## 2022-12-27 MED ORDER — ENOXAPARIN SODIUM 40 MG/0.4ML IJ SOSY
40.0000 mg | PREFILLED_SYRINGE | INTRAMUSCULAR | Status: DC
  Administered 2022-12-27 – 2022-12-28 (×2): 40 mg via SUBCUTANEOUS
  Filled 2022-12-27 (×2): qty 0.4

## 2022-12-27 MED ORDER — OXYCODONE HCL 5 MG PO TABS
5.0000 mg | ORAL_TABLET | Freq: Four times a day (QID) | ORAL | Status: DC | PRN
  Administered 2022-12-28: 5 mg via ORAL
  Filled 2022-12-27: qty 1

## 2022-12-27 MED ORDER — INSULIN ASPART 100 UNIT/ML IJ SOLN
0.0000 [IU] | Freq: Three times a day (TID) | INTRAMUSCULAR | Status: DC
  Administered 2022-12-28: 2 [IU] via SUBCUTANEOUS
  Administered 2022-12-28: 1 [IU] via SUBCUTANEOUS
  Filled 2022-12-27: qty 0.09

## 2022-12-27 MED ORDER — PROCHLORPERAZINE EDISYLATE 10 MG/2ML IJ SOLN: 5.0000 mg | Freq: Four times a day (QID) | INTRAMUSCULAR | Status: DC | PRN

## 2022-12-27 MED ORDER — SODIUM CHLORIDE 0.9 % IV SOLN
2.0000 g | INTRAVENOUS | Status: DC
  Administered 2022-12-27 – 2022-12-28 (×2): 2 g via INTRAVENOUS
  Filled 2022-12-27 (×2): qty 20

## 2022-12-27 NOTE — H&P (Addendum)
History and Physical  Jessica Yates ZOX:[id number] DOB: [birth year] DOA: 12/27/2022  Referring physician: Dr. Estell Harpin, EDP  PCP: Renaye Rakers, MD  Outpatient Specialists: None Patient coming from: Home  Chief Complaint: Left lower extremity, redness, swelling, warmth, and pain   HPI: Jessica Yates is a 72 y.o. female with medical history significant for lymphedema, left lower extremity DVT more than 10 years ago, not currently on anticoagulation, previous cellulitis, asthma, GERD, hypertension, hyperlipidemia, chronic diastolic CHF, who presented to [facility] ED from home due to left lower extremity redness, swelling, warmth, and pain for a few days.  Denies subjective fevers or chills.  In the ED, lab studies remarkable for leukocytosis with WBC greater than 16,000, on exam left lower extremity with concern for cellulitis.  The patient was started on empiric IV antibiotics, IV vancomycin.  TR[facility], hospitalist service, was asked to admit.  ED Course: BP 112/66, pulse 94, respiratory 100% on room air.  Lab studies markable for WBC 16.6, neutrophil count 15.3.  Serum bicarb 21, glucose 179, creatinine 1.27 from baseline creatinine of 0.6.  GFR 45 from baseline GFR greater than 60.  Review of Systems: Review of systems as noted in the HPI. All other systems reviewed and are negative.   Past Medical History:  Diagnosis Date   Allergic rhinitis    Anemia 2002   Anxiety    Arthritis    Asthma    Blood clot in vein    Cellulitis    Cellulitis of right lower extremity    Chronic diastolic (congestive) heart failure    Chronic kidney disease, stage 3    Coagulation defect    Cough    Depression 02/06/2008   Diabetes mellitus, type 2    DM2 (diabetes mellitus, type 2) 11/18/2022   DVT (deep venous thrombosis) 2002   left leg   Dysphagia    Elevated hemoglobin A1c    GERD (gastroesophageal reflux disease)    Glaucoma    Holosystolic murmur    Kidney cysts    Leukocytosis    Liver cyst     Lymphedema    Morbid obesity    Overactive bladder    Palpitations    Peripheral venous insufficiency    Primary open angle glaucoma (POAG) of both eyes, mild stage    Seasonal allergies    Sickle cell trait    Tortuous aorta    Unstable gait    Upper airway cough syndrome    Vitamin D deficiency    Past Surgical History:  Procedure Laterality Date   COSMETIC SURGERY  1975   nasal reconstruction   TOTAL ABDOMINAL HYSTERECTOMY  07/2001    Social History:  reports that she has never smoked. She has never used smokeless tobacco. She reports that she does not drink alcohol and does not use drugs.   No Known Allergies  Family History  Problem Relation Age of Onset   Allergies Mother    Asthma Mother    Other Mother        enlarged heart   Arthritis Mother    Breast cancer Mother    Diabetes Mother    Hypertension Mother    Cancer Mother        breast   Hyperlipidemia Mother    Heart disease Maternal Grandmother    Breast cancer Maternal Grandmother    Birth defects Maternal Grandmother        breast   Cancer Father 72       leukemia  Allergies Sister    Arthritis Sister       Prior to Admission medications   Medication Sig Start Date End Date Taking? Authorizing Provider  acetaminophen (TYLENOL) 500 MG tablet Take 1,000 mg by mouth every 6 (six) hours as needed for mild pain.     [provider]  albuterol (VENTOLIN HFA) 108 (90 Base) MCG/ACT inhaler Inhale 2 puffs into the lungs every 6 (six) hours as needed for wheezing or shortness of breath. 12/14/21   Hunsucker, Lesia Sago, MD  aspirin 81 MG chewable tablet Chew 81 mg by mouth at bedtime.    [provider]  benzonatate (TESSALON) 200 MG capsule Take 200 mg by mouth 3 (three) times daily as needed for cough.    [provider]  blood glucose meter kit and supplies KIT Dispense based on patient and insurance preference. Use up to four times daily as directed. (FOR ICD-9 250.00,  250.01). 05/20/19   Darlin Drop, DO  cetirizine (ZYRTEC) 10 MG tablet Take 1 tablet (10 mg total) by mouth daily. Patient not taking: Reported on 11/18/2022 07/02/22   Parrett, Virgel Bouquet, NP  Cholecalciferol (VITAMIN D3) 25 MCG (1000 UT) capsule Take 1,000 Units by mouth 3 (three) times a week.    [provider]  dapagliflozin propanediol (FARXIGA) 10 MG TABS tablet Take 10 mg by mouth daily before breakfast.    [provider]  famotidine (PEPCID) 40 MG tablet TAKE 1 TABLET BY MOUTH EVERYDAY AT BEDTIME Patient taking differently: Take 40 mg by mouth at bedtime. 01/25/22   Esterwood, Amy S, PA-C  fluticasone (FLONASE) 50 MCG/ACT nasal spray Place 2 sprays into both nostrils daily. Patient taking differently: Place 2 sprays into both nostrils daily as needed for allergies or rhinitis. 07/02/22   Parrett, Virgel Bouquet, NP  folic acid (FOLVITE) 1 MG tablet Take 1 tablet (1 mg total) by mouth daily. 11/23/22   Marguerita Merles Latif, DO  furosemide (LASIX) 40 MG tablet Take 1 tablet by mouth twice a day X's 3 days then go down to 1 tablet by mouth daily Patient taking differently: Take 40 mg by mouth See admin instructions. Take 40 mg by mouth every one to two days 04/26/22   Meriam Sprague, MD  GEMTESA 75 MG TABS Take 75 mg by mouth daily.    [provider]  iron polysaccharides (NIFEREX) 150 MG capsule Take 1 capsule (150 mg total) by mouth daily. 11/23/22   Marguerita Merles Latif, DO  ketoconazole (NIZORAL) 2 % cream Apply to both feet and between toes once daily for 6 weeks. Patient not taking: Reported on 11/18/2022 03/29/22   [name], DPM  losartan (COZAAR) 25 MG tablet Take 12.5 mg by mouth See admin instructions. Take 12.5 mg by mouth only when taking Furosemide 05/04/22   [provider]  RABEprazole (ACIPHEX) 20 MG tablet TAKE 1 TABLET BY MOUTH TWICE A DAY (BEFORE BREAKFAST AND DINNER) 11/29/22   Esterwood, Amy S, PA-C  rosuvastatin (CRESTOR) 40 MG tablet TAKE  1 TABLET BY MOUTH EVERY DAY Patient taking differently: Take 40 mg by mouth at bedtime. 10/11/22   Dunn, Tacey Ruiz, PA-C  timolol (TIMOPTIC) 0.5 % ophthalmic solution Place 1 drop into both eyes See admin instructions. Instill 1 drop into both eyes in the morning and evening 07/16/21   [provider]  TRAVATAN Z 0.004 % SOLN ophthalmic solution Place 1 drop into both eyes at bedtime.  03/14/11   [provider]  Physical Exam: BP 123/68 (BP Location: Right Arm)   Pulse (!) 110   Temp 99.5 F (37.5 C) (Oral)   Resp 18   SpO2 99%   General: 72 y.o. year-old female well developed well nourished in no acute distress.  Alert and interactive. Cardiovascular: Regular rate and rhythm with no rubs or gallops.  No thyromegaly or JVD noted.  No lower extremity edema. 2/4 pulses in all 4 extremities. Respiratory: Clear to auscultation with no wheezes or rales. Good inspiratory effort. Abdomen: Soft nontender nondistended with normal bowel sounds x4 quadrants. Muskuloskeletal: No cyanosis, clubbing or edema noted bilaterally Neuro: CN II-XII intact, strength, sensation, reflexes Skin: Left lower extremity erythema, warmth, edematous and tender to touch Psychiatry: Judgement and insight appear normal. Mood is appropriate for condition and setting          Labs on Admission:  Basic Metabolic Panel: Recent Labs  Lab 12/27/22 2012  NA 136  K 4.2  CL 102  CO2 21*  GLUCOSE 179*  BUN 16  CREATININE 1.27*  CALCIUM 9.9   Liver Function Tests: Recent Labs  Lab 12/27/22 2012  AST 35  ALT 17  ALKPHOS 86  BILITOT 1.0  PROT 8.3*  ALBUMIN 3.9   No results for input(s): "LIPASE", "AMYLASE" in the last 168 hours. No results for input(s): "AMMONIA" in the last 168 hours. CBC: Recent Labs  Lab 12/27/22 2012  WBC 16.6*  NEUTROABS 15.3*  HGB 12.1  HCT 38.6  MCV 84.5  PLT 206   Cardiac Enzymes: No results for input(s): "CKTOTAL", "CKMB", "CKMBINDEX", "TROPONINI" in the  last 168 hours.  BNP (last 3 results) Recent Labs    11/18/22 0503  BNP 155.7*    ProBNP (last 3 results) No results for input(s): "PROBNP" in the last 8760 hours.  CBG: No results for input(s): "GLUCAP" in the last 168 hours.  Radiological Exams on Admission: No results found.  EKG: I independently viewed the EKG done and my findings are as followed: None available at the time of this visit.  Assessment/Plan Present on Admission:  Cellulitis  Principal Problem:   Cellulitis  Sepsis secondary to left lower extremity cellulitis, POA Presented with leukocytosis WBC 16.6, tachycardia heart rate 110, left lower extremity erythema, edema, warmth and tenderness suggestive of cellulitis. Started on IV vancomycin in the ED, add Rocephin for broader coverage. Obtain peripheral blood cultures x 2, MRSA screening test Start IV fluid NS at 50 cc/h x 2 days Maintain MAP greater than 65 Monitor fever curve and WBC. As needed analgesics  AKI, suspect prerenal in the setting of dehydration Creatinine at baseline 0.6 with GFR greater than 60 Presented with creatinine 1.27 GFR 45 Start IV fluid hydration NS at 50 cc/h times today Monitor urine output Avoid nephrotoxic agents, dehydration and hypotension Repeat BMP in the morning.  Type 2 diabetes with hyperglycemia Last hemoglobin A1c 7.1 on 11/17/2022 Start heart healthy carb modified diet. Start insulin sliding scale.  Mild non anion gap metabolic acidosis Serum bicarb 21, anion gap 13 Continue IV fluid hydration Repeat BMP in the morning.  GERD Resume home H2 blocker and PPI  Hyperlipidemia Resume home Crestor  History of left lower extremity DVT, more than 10 years ago Off anticoagulation Rule out recurrent DVT Left lower extremity vascular ultrasound ordered    DVT prophylaxis: Subcu Lovenox daily  Code Status: Full code  Family Communication: Updated her sister at bedside  Disposition Plan: Admitted to  telemetry unit  Consults called: None.  Admission status: Inpatient status.   Status is: Inpatient The patient requires at least 2 midnights for further evaluation and treatment of present condition.   Darlin Drop MD [facility] Pager [phone]  If 7PM-7AM, please contact night-coverage [email or web address] Password [facility]  12/27/2022, 9:24 PM

## 2022-12-27 NOTE — ED Notes (Signed)
ED TO INPATIENT HANDOFF REPORT  Name/Age/Gender Jessica Yates 72 y.o. female  Code Status    Code Status Orders  (From admission, onward)           Start     Ordered   12/27/22 2127  Full code  Continuous       Question:  By:  Answer:  Consent: discussion documented in EHR   12/27/22 2126           Code Status History     Date Active Date Inactive Code Status Order ID Comments User Context   11/17/2022 2322 11/22/2022 2233 Full Code [id number]  Angie Fava, DO ED   08/05/2019 0507 08/12/2019 2355 Full Code [id number]  Pearson Grippe, MD ED   05/12/2019 0319 05/20/2019 2147 Full Code [id number]  Eduard Clos, MD Inpatient   07/22/2018 2347 07/28/2018 2233 Full Code [id number]  Rometta Emery, MD Inpatient       Home/SNF/Other Home  Chief Complaint Cellulitis [L03.90]  Level of Care/Admitting Diagnosis ED Disposition     ED Disposition  Admit   Condition  --   Comment  Hospital Area: [facility] [[id number]]  Level of Care: Telemetry [5]  Admit to tele based on following criteria: Monitor for Ischemic changes  May admit patient to [facility] or [facility] if equivalent level of care is available:: Yes  Covid Evaluation: Asymptomatic - no recent exposure (last 10 days) testing not required  Diagnosis: Cellulitis [244010]  Admitting Physician: Darlin Drop [[id number]]  Attending Physician: Darlin Drop [[id number]]  Certification:: I certify this patient will need inpatient services for at least 2 midnights  Estimated Length of Stay: 2          Medical History Past Medical History:  Diagnosis Date   Allergic rhinitis    Anemia 2002   Anxiety    Arthritis    Asthma    Blood clot in vein    Cellulitis    Cellulitis of right lower extremity    Chronic diastolic (congestive) heart failure    Chronic kidney disease, stage 3    Coagulation defect    Cough    Depression 02/06/2008   Diabetes mellitus, type 2    DM2  (diabetes mellitus, type 2) 11/18/2022   DVT (deep venous thrombosis) 2002   left leg   Dysphagia    Elevated hemoglobin A1c    GERD (gastroesophageal reflux disease)    Glaucoma    Holosystolic murmur    Kidney cysts    Leukocytosis    Liver cyst    Lymphedema    Morbid obesity    Overactive bladder    Palpitations    Peripheral venous insufficiency    Primary open angle glaucoma (POAG) of both eyes, mild stage    Seasonal allergies    Sickle cell trait    Tortuous aorta    Unstable gait    Upper airway cough syndrome    Vitamin D deficiency     Allergies No Known Allergies  IV Location/Drains/Wounds Patient Lines/Drains/Airways Status     Active Line/Drains/Airways     Name Placement date Placement time Site Days   Peripheral IV 12/27/22 20 G Anterior;Distal;Left Forearm 12/27/22  2016  Forearm  less than 1            Labs/Imaging Results for orders placed or performed during the hospital encounter of 12/27/22 (from the past 48 hour(s))  CBC with Differential  Status: Abnormal   Collection Time: 12/27/22  8:12 PM  Result Value Ref Range   WBC 16.6 (H) 4.0 - 10.5 K/uL   RBC 4.57 3.87 - 5.11 MIL/uL   Hemoglobin 12.1 12.0 - 15.0 g/dL   HCT 16.3 84.5 - 36.4 %   MCV 84.5 80.0 - 100.0 fL   MCH 26.5 26.0 - 34.0 pg   MCHC 31.3 30.0 - 36.0 g/dL   RDW 68.0 (H) 32.1 - 22.4 %   Platelets 206 150 - 400 K/uL   nRBC 0.0 0.0 - 0.2 %   Neutrophils Relative % 92 %   Neutro Abs 15.3 (H) 1.7 - 7.7 K/uL   Lymphocytes Relative 4 %   Lymphs Abs 0.6 (L) 0.7 - 4.0 K/uL   Monocytes Relative 3 %   Monocytes Absolute 0.5 0.1 - 1.0 K/uL   Eosinophils Relative 0 %   Eosinophils Absolute 0.0 0.0 - 0.5 K/uL   Basophils Relative 0 %   Basophils Absolute 0.0 0.0 - 0.1 K/uL   Immature Granulocytes 1 %   Abs Immature Granulocytes 0.19 (H) 0.00 - 0.07 K/uL    Comment: Performed at [facility], [address] [place], [place] [address]  Comprehensive metabolic  panel     Status: Abnormal   Collection Time: 12/27/22  8:12 PM  Result Value Ref Range   Sodium 136 135 - 145 mmol/L   Potassium 4.2 3.5 - 5.1 mmol/L    Comment: HEMOLYSIS AT THIS LEVEL MAY AFFECT RESULT   Chloride 102 98 - 111 mmol/L   CO2 21 (L) 22 - 32 mmol/L   Glucose, Bld 179 (H) 70 - 99 mg/dL    Comment: Glucose reference range applies only to samples taken after fasting for at least 8 hours.   BUN 16 8 - 23 mg/dL   Creatinine, Ser 3.70 (H) 0.44 - 1.00 mg/dL   Calcium 9.9 8.9 - 48.8 mg/dL   Total Protein 8.3 (H) 6.5 - 8.1 g/dL   Albumin 3.9 3.5 - 5.0 g/dL   AST 35 15 - 41 U/L    Comment: HEMOLYSIS AT THIS LEVEL MAY AFFECT RESULT   ALT 17 0 - 44 U/L    Comment: HEMOLYSIS AT THIS LEVEL MAY AFFECT RESULT   Alkaline Phosphatase 86 38 - 126 U/L   Total Bilirubin 1.0 0.3 - 1.2 mg/dL    Comment: HEMOLYSIS AT THIS LEVEL MAY AFFECT RESULT   GFR, Estimated 45 (L) >60 mL/min    Comment: (NOTE) Calculated using the CKD-EPI Creatinine Equation (2021)    Anion gap 13 5 - 15    Comment: Performed at [facility], [address] [place], [place] [address]   No results found.  Pending Labs Unresulted Labs (From admission, onward)     Start     Ordered   01/03/23 0500  Creatinine, serum  (enoxaparin (LOVENOX)    CrCl >/= 30 ml/min)  Weekly,   R     Comments: while on enoxaparin therapy    12/27/22 2126   12/28/22 0500  CBC  Tomorrow morning,   R        12/27/22 2138   12/28/22 0500  Basic metabolic panel  Tomorrow morning,   R        12/27/22 2138   12/28/22 0500  Magnesium  Tomorrow morning,   R        12/27/22 2138   12/28/22 0500  Phosphorus  Tomorrow morning,   R  12/27/22 2138   12/27/22 2134  Culture, blood (Routine X 2) w Reflex to ID Panel  BLOOD CULTURE X 2,   R     Question:  Patient immune status  Answer:  Normal   12/27/22 2133   12/27/22 2134  MRSA Next Gen by PCR, Nasal  Once,   R       Question:  Patient immune status  Answer:  Normal    12/27/22 2133            Vitals/Pain Today's Vitals   12/27/22 1919 12/27/22 1920 12/27/22 2030 12/27/22 2100  BP: 123/68  111/66 112/66  Pulse: (!) 110   94  Resp: 18     Temp: 99.5 F (37.5 C)     TempSrc: Oral     SpO2: 99%   100%  PainSc:  6       Isolation Precautions No active isolations  Medications Medications  enoxaparin (LOVENOX) injection 40 mg (has no administration in time range)  0.9 %  sodium chloride infusion (has no administration in time range)  insulin aspart (novoLOG) injection 0-9 Units (has no administration in time range)  insulin aspart (novoLOG) injection 0-5 Units (has no administration in time range)  cefTRIAXone (ROCEPHIN) 2 g in sodium chloride 0.9 % 100 mL IVPB (has no administration in time range)  acetaminophen (TYLENOL) tablet 650 mg (has no administration in time range)  oxyCODONE (Oxy IR/ROXICODONE) immediate release tablet 5 mg (has no administration in time range)  polyethylene glycol (MIRALAX / GLYCOLAX) packet 17 g (has no administration in time range)  senna-docusate (Senokot-S) tablet 1 tablet (has no administration in time range)  melatonin tablet 5 mg (has no administration in time range)  prochlorperazine (COMPAZINE) injection 5 mg (has no administration in time range)  aspirin chewable tablet 81 mg (has no administration in time range)  famotidine (PEPCID) tablet 40 mg (has no administration in time range)  fluticasone (FLONASE) 50 MCG/ACT nasal spray 2 spray (has no administration in time range)  folic acid (FOLVITE) tablet 1 mg (has no administration in time range)  pantoprazole (PROTONIX) EC tablet 40 mg (has no administration in time range)  rosuvastatin (CRESTOR) tablet 40 mg (has no administration in time range)  vancomycin (VANCOCIN) IVPB 1000 mg/200 mL premix (has no administration in time range)  vancomycin (VANCOCIN) IVPB 1000 mg/200 mL premix (0 mg Intravenous Stopped 12/27/22 2126)    Mobility walks with person  assist

## 2022-12-27 NOTE — ED Notes (Signed)
Unsuccessful blood draw attempt in R AC, pt having difficulty tolerating attempt.

## 2022-12-27 NOTE — ED Triage Notes (Signed)
Pt states that she began to have L leg pain on the calf, redness warmth and swelling, hx of DVT and cellulitis

## 2022-12-27 NOTE — ED Provider Notes (Signed)
[place] EMERGENCY DEPARTMENT AT [facility] Provider Note   CSN: [id number] Arrival date & time: 12/27/22  1909     History {Add pertinent medical, surgical, social history, OB history to HPI:1} Chief Complaint  Patient presents with   Leg Pain    Jessica Yates is a 72 y.o. female.  Patient complains of swelling and redness to left leg.  She has a history of lymphedema.  Patient has had cellulitis before.   Leg Pain      Home Medications Prior to Admission medications   Medication Sig Start Date End Date Taking? Authorizing Provider  acetaminophen (TYLENOL) 500 MG tablet Take 1,000 mg by mouth every 6 (six) hours as needed for mild pain.     [provider]  albuterol (VENTOLIN HFA) 108 (90 Base) MCG/ACT inhaler Inhale 2 puffs into the lungs every 6 (six) hours as needed for wheezing or shortness of breath. 12/14/21   Hunsucker, Lesia Sago, MD  aspirin 81 MG chewable tablet Chew 81 mg by mouth at bedtime.    [provider]  benzonatate (TESSALON) 200 MG capsule Take 200 mg by mouth 3 (three) times daily as needed for cough.    [provider]  blood glucose meter kit and supplies KIT Dispense based on patient and insurance preference. Use up to four times daily as directed. (FOR ICD-9 250.00, 250.01). 05/20/19   Darlin Drop, DO  cetirizine (ZYRTEC) 10 MG tablet Take 1 tablet (10 mg total) by mouth daily. Patient not taking: Reported on 11/18/2022 07/02/22   Parrett, Virgel Bouquet, NP  Cholecalciferol (VITAMIN D3) 25 MCG (1000 UT) capsule Take 1,000 Units by mouth 3 (three) times a week.    [provider]  dapagliflozin propanediol (FARXIGA) 10 MG TABS tablet Take 10 mg by mouth daily before breakfast.    [provider]  famotidine (PEPCID) 40 MG tablet TAKE 1 TABLET BY MOUTH EVERYDAY AT BEDTIME Patient taking differently: Take 40 mg by mouth at bedtime. 01/25/22   Esterwood, Amy S, PA-C  fluticasone (FLONASE) 50 MCG/ACT  nasal spray Place 2 sprays into both nostrils daily. Patient taking differently: Place 2 sprays into both nostrils daily as needed for allergies or rhinitis. 07/02/22   Parrett, Virgel Bouquet, NP  folic acid (FOLVITE) 1 MG tablet Take 1 tablet (1 mg total) by mouth daily. 11/23/22   Marguerita Merles Latif, DO  furosemide (LASIX) 40 MG tablet Take 1 tablet by mouth twice a day X's 3 days then go down to 1 tablet by mouth daily Patient taking differently: Take 40 mg by mouth See admin instructions. Take 40 mg by mouth every one to two days 04/26/22   Meriam Sprague, MD  GEMTESA 75 MG TABS Take 75 mg by mouth daily.    [provider]  iron polysaccharides (NIFEREX) 150 MG capsule Take 1 capsule (150 mg total) by mouth daily. 11/23/22   Marguerita Merles Latif, DO  ketoconazole (NIZORAL) 2 % cream Apply to both feet and between toes once daily for 6 weeks. Patient not taking: Reported on 11/18/2022 03/29/22   [name], DPM  losartan (COZAAR) 25 MG tablet Take 12.5 mg by mouth See admin instructions. Take 12.5 mg by mouth only when taking Furosemide 05/04/22   [provider]  RABEprazole (ACIPHEX) 20 MG tablet TAKE 1 TABLET BY MOUTH TWICE A DAY (BEFORE BREAKFAST AND DINNER) 11/29/22   Esterwood, Amy S, PA-C  rosuvastatin (CRESTOR) 40 MG tablet TAKE 1 TABLET  BY MOUTH EVERY DAY Patient taking differently: Take 40 mg by mouth at bedtime. 10/11/22   Dunn, Tacey Ruiz, PA-C  timolol (TIMOPTIC) 0.5 % ophthalmic solution Place 1 drop into both eyes See admin instructions. Instill 1 drop into both eyes in the morning and evening 07/16/21   [provider]  TRAVATAN Z 0.004 % SOLN ophthalmic solution Place 1 drop into both eyes at bedtime.  03/14/11   [provider]      Allergies    Patient has no known allergies.    Review of Systems   Review of Systems  Physical Exam Updated Vital Signs BP 112/66   Pulse 94   Temp 99.5 F (37.5 C) (Oral)   Resp 18   SpO2 100%  Physical  Exam  ED Results / Procedures / Treatments   Labs (all labs ordered are listed, but only abnormal results are displayed) Labs Reviewed  CBC WITH DIFFERENTIAL/PLATELET - Abnormal; Notable for the following components:      Result Value   WBC 16.6 (*)    RDW 16.2 (*)    Neutro Abs 15.3 (*)    Lymphs Abs 0.6 (*)    Abs Immature Granulocytes 0.19 (*)    All other components within normal limits  COMPREHENSIVE METABOLIC PANEL - Abnormal; Notable for the following components:   CO2 21 (*)    Glucose, Bld 179 (*)    Creatinine, Ser 1.27 (*)    Total Protein 8.3 (*)    GFR, Estimated 45 (*)    All other components within normal limits    EKG None  Radiology No results found.  Procedures Procedures  {Document cardiac monitor, telemetry assessment procedure when appropriate:1}  Medications Ordered in ED Medications  enoxaparin (LOVENOX) injection 40 mg (has no administration in time range)  vancomycin (VANCOCIN) IVPB 1000 mg/200 mL premix (0 mg Intravenous Stopped 12/27/22 2126)    ED Course/ Medical Decision Making/ A&P   {   Click here for ABCD2, HEART and other calculatorsREFRESH Note before signing :1}                          Medical Decision Making Amount and/or Complexity of Data Reviewed Labs: ordered.  Risk Prescription drug management. Decision regarding hospitalization.   Patient with cellulitis to left lower leg.  She is given vancomycin here and will be admitted to medicine for IV antibiotics  {Document critical care time when appropriate:1} {Document review of labs and clinical decision tools ie heart score, Chads2Vasc2 etc:1}  {Document your independent review of radiology images, and any outside records:1} {Document your discussion with family members, caretakers, and with consultants:1} {Document social determinants of health affecting pt's care:1} {Document your decision making why or why not admission, treatments were needed:1} Final Clinical  Impression(s) / ED Diagnoses Final diagnoses:  Cellulitis of left lower leg    Rx / DC Orders ED Discharge Orders     None

## 2022-12-27 NOTE — ED Provider Triage Note (Signed)
Emergency Medicine Provider Triage Evaluation Note  Jessica Yates , a 72 y.o. female  was evaluated in triage.  Pt complains of left leg pain she reports that the left leg has been More Painful, Swollen, Red and Is Warm to the Touch.  She Has a Prior History of DVTs and Cellulitis.  Not currently on blood thinners but takes aspirin daily.  Denies any recent fevers.  Denies any recent traumas lower extremities.  Review of Systems  Positive: As above Negative: As above  Physical Exam  BP 123/68 (BP Location: Right Arm)   Pulse (!) 110   Temp 99.5 F (37.5 C) (Oral)   Resp 18   SpO2 99%  Gen:   Awake, no distress   Resp:  Normal effort  MSK:   Moves extremities without difficulty  Other:  Left lower extremity is erythematous and edematous compared to the right lower extremity.  Pain to palpation of the left calf.  Medical Decision Making  Medically screening exam initiated at 7:31 PM.  Appropriate orders placed.  [name] was informed that the remainder of the evaluation will be completed by another provider, this initial triage assessment does not replace that evaluation, and the importance of remaining in the ED until their evaluation is complete.    Smitty Knudsen, PA-C 12/27/22 1932

## 2022-12-28 ENCOUNTER — Inpatient Hospital Stay (HOSPITAL_COMMUNITY): Payer: No Typology Code available for payment source

## 2022-12-28 ENCOUNTER — Other Ambulatory Visit: Payer: Self-pay

## 2022-12-28 DIAGNOSIS — N179 Acute kidney failure, unspecified: Secondary | ICD-10-CM

## 2022-12-28 DIAGNOSIS — N1831 Chronic kidney disease, stage 3a: Secondary | ICD-10-CM

## 2022-12-28 DIAGNOSIS — M7989 Other specified soft tissue disorders: Secondary | ICD-10-CM | POA: Diagnosis not present

## 2022-12-28 DIAGNOSIS — L03116 Cellulitis of left lower limb: Secondary | ICD-10-CM | POA: Diagnosis not present

## 2022-12-28 DIAGNOSIS — A419 Sepsis, unspecified organism: Secondary | ICD-10-CM | POA: Diagnosis not present

## 2022-12-28 LAB — CBC
HCT: 30.4 % — ABNORMAL LOW (ref 36.0–46.0)
Hemoglobin: 9.9 g/dL — ABNORMAL LOW (ref 12.0–15.0)
MCH: 26.8 pg (ref 26.0–34.0)
MCHC: 32.6 g/dL (ref 30.0–36.0)
MCV: 82.4 fL (ref 80.0–100.0)
Platelets: 177 10*3/uL (ref 150–400)
RBC: 3.69 MIL/uL — ABNORMAL LOW (ref 3.87–5.11)
RDW: 15.9 % — ABNORMAL HIGH (ref 11.5–15.5)
WBC: 12.8 10*3/uL — ABNORMAL HIGH (ref 4.0–10.5)
nRBC: 0 % (ref 0.0–0.2)

## 2022-12-28 LAB — GLUCOSE, CAPILLARY
Glucose-Capillary: 95 mg/dL (ref 70–99)
Glucose-Capillary: 185 mg/dL — ABNORMAL HIGH (ref 70–99)
Glucose-Capillary: 139 mg/dL — ABNORMAL HIGH (ref 70–99)
Glucose-Capillary: 144 mg/dL — ABNORMAL HIGH (ref 70–99)

## 2022-12-28 LAB — BASIC METABOLIC PANEL
Anion gap: 8 (ref 5–15)
BUN: 13 mg/dL (ref 8–23)
CO2: 24 mmol/L (ref 22–32)
Calcium: 9.2 mg/dL (ref 8.9–10.3)
Chloride: 106 mmol/L (ref 98–111)
Creatinine, Ser: 0.95 mg/dL (ref 0.44–1.00)
GFR, Estimated: >60 mL/min (ref >60)
Glucose, Bld: 167 mg/dL — ABNORMAL HIGH (ref 70–99)
Potassium: 3.2 mmol/L — ABNORMAL LOW (ref 3.5–5.1)
Sodium: 138 mmol/L (ref 135–145)

## 2022-12-28 LAB — MAGNESIUM: Magnesium: 2.1 mg/dL (ref 1.7–2.4)

## 2022-12-28 LAB — PHOSPHORUS: Phosphorus: 2.2 mg/dL — ABNORMAL LOW (ref 2.5–4.6)

## 2022-12-28 LAB — MRSA NEXT GEN BY PCR, NASAL: MRSA by PCR Next Gen: NOT DETECTED

## 2022-12-28 MED ORDER — VANCOMYCIN HCL IN DEXTROSE 1-5 GM/200ML-% IV SOLN
1000.0000 mg | INTRAVENOUS | Status: DC
  Administered 2022-12-28: 1000 mg via INTRAVENOUS
  Filled 2022-12-28: qty 200

## 2022-12-28 MED ORDER — TIMOLOL MALEATE 0.5 % OP SOLN
1.0000 [drp] | Freq: Two times a day (BID) | OPHTHALMIC | Status: DC
  Administered 2022-12-28 – 2022-12-29 (×2): 1 [drp] via OPHTHALMIC
  Filled 2022-12-28: qty 5

## 2022-12-28 MED ORDER — VANCOMYCIN HCL 1750 MG/350ML IV SOLN: 1750.0000 mg | INTRAVENOUS | Status: DC

## 2022-12-28 MED ORDER — POTASSIUM PHOSPHATES 15 MMOLE/5ML IV SOLN
30.0000 mmol | Freq: Once | INTRAVENOUS | Status: AC
  Administered 2022-12-28: 30 mmol via INTRAVENOUS
  Filled 2022-12-28: qty 10

## 2022-12-28 MED ORDER — LATANOPROST 0.005 % OP SOLN
1.0000 [drp] | Freq: Every day | OPHTHALMIC | Status: DC
  Administered 2022-12-28: 1 [drp] via OPHTHALMIC
  Filled 2022-12-28: qty 2.5

## 2022-12-28 NOTE — Progress Notes (Signed)
Progress Note    Jessica Yates   ZOX:[id number]  DOB: 03/22/1951  DOA: 12/27/2022     1 PCP: Renaye Rakers, MD  Initial CC: left leg tenderness, swelling  Hospital Course: Jessica Yates is a 72 year old female with PMH lymphedema, depression/anxiety, lower extremity DVT, diastolic CHF, CKD 3, DM II, GERD, obesity, glaucoma, sickle cell trait who presented with left leg increased swelling, redness, warmth, and pain. She has history of cellulitis in both legs in the past.  Last episode of cellulitis involved right lower extremity.  She typically responds well to antibiotics. She was started on vancomycin and Rocephin.  Left lower extremity duplex was also obtained on admission which was negative for DVT.  Interval History:  Seen this morning in her room with improved pain in left leg.  Still somewhat tender and red  per patient but seems to be better than when she was admitted.  Assessment and Plan: * Cellulitis - Patient has chronic lower extremity lymphedema, typically worse swelling in left leg although she states it has acutely become tender and more red.  No open wounds appreciated on admission - Patient started on vancomycin and Rocephin.  Some improvement in pain this morning - Continue IV antibiotics through today and if still showing improvement, probable discharge tomorrow -Left lower extremity duplex negative for DVT  Sepsis-resolved as of 12/28/2022 - Leukocytosis, tachycardia, left lower extremity cellulitis  DM2 (diabetes mellitus, type 2) - Continue SSI and CBG monitoring  Primary open angle glaucoma of both eyes, mild stage - Continue Xalatan and timolol  GERD (gastroesophageal reflux disease) - Continue home regimen  Lymphedema - Chronic lower extremity lymphedema with chronically worse left-sided swelling compared to right  Acute renal failure superimposed on stage 3a chronic kidney disease-resolved as of 12/28/2022 - patient has history of CKD3a. Baseline  creat ~ 1.2, eGFR~ 50 - patient presents with increase in creat >0.3 mg/dL above baseline, creat increase >1.5x baseline presumed to have occurred within past 7 days PTA -Mild worsening from baseline with drop in GFR -Improved with fluids on admission   Old records reviewed in assessment of this patient  Antimicrobials: Vanc 4/15 >> current Rocephin 4/15 >> current   DVT prophylaxis:  enoxaparin (LOVENOX) injection 40 mg Start: 12/27/22 2200   Code Status:   Code Status: Full Code  Mobility Assessment (last 72 hours)     Mobility Assessment     Row Name 12/28/22 0300 12/27/22 2300         Does patient have an order for bedrest or is patient medically unstable No - Continue assessment No - Continue assessment      What is the highest level of mobility based on the progressive mobility assessment? Level 5 (Walks with assist in room/hall) - Balance while stepping forward/back and can walk in room with assist - Complete Level 5 (Walks with assist in room/hall) - Balance while stepping forward/back and can walk in room with assist - Complete               Barriers to discharge:  Disposition Plan:  Home 1-2 days Status is: Inpt  Objective: Blood pressure (!) 104/51, pulse 87, temperature 99 F (37.2 C), temperature source Oral, resp. rate 18, height  (1.702 m), weight 94.9 kg, SpO2 100 %.  Examination:  Physical Exam Constitutional:      Appearance: Normal appearance.  HENT:     Head: Normocephalic and atraumatic.     Mouth/Throat:     Mouth: Mucous  membranes are moist.  Eyes:     Extraocular Movements: Extraocular movements intact.  Cardiovascular:     Rate and Rhythm: Normal rate and regular rhythm.  Pulmonary:     Effort: Pulmonary effort is normal.     Breath sounds: Normal breath sounds.  Abdominal:     General: Bowel sounds are normal. There is no distension.     Palpations: Abdomen is soft.     Tenderness: There is no abdominal tenderness.   Musculoskeletal:        General: Swelling (chronic LE edema apprecaited consistent with lymphedema but worse in LLE with some mild erythema noted and mild TTP and calor) present. Normal range of motion.     Cervical back: Normal range of motion and neck supple.  Skin:    General: Skin is warm and dry.  Neurological:     General: No focal deficit present.     Mental Status: She is alert.  Psychiatric:        Mood and Affect: Mood normal.      Consultants:    Procedures:    Data Reviewed: Results for orders placed or performed during the hospital encounter of 12/27/22 (from the past 24 hour(s))  CBC with Differential     Status: Abnormal   Collection Time: 12/27/22  8:12 PM  Result Value Ref Range   WBC 16.6 (H) 4.0 - 10.5 K/uL   RBC 4.57 3.87 - 5.11 MIL/uL   Hemoglobin 12.1 12.0 - 15.0 g/dL   HCT 95.2 84.1 - 32.4 %   MCV 84.5 80.0 - 100.0 fL   MCH 26.5 26.0 - 34.0 pg   MCHC 31.3 30.0 - 36.0 g/dL   RDW 40.1 (H) 02.7 - 25.3 %   Platelets 206 150 - 400 K/uL   nRBC 0.0 0.0 - 0.2 %   Neutrophils Relative % 92 %   Neutro Abs 15.3 (H) 1.7 - 7.7 K/uL   Lymphocytes Relative 4 %   Lymphs Abs 0.6 (L) 0.7 - 4.0 K/uL   Monocytes Relative 3 %   Monocytes Absolute 0.5 0.1 - 1.0 K/uL   Eosinophils Relative 0 %   Eosinophils Absolute 0.0 0.0 - 0.5 K/uL   Basophils Relative 0 %   Basophils Absolute 0.0 0.0 - 0.1 K/uL   Immature Granulocytes 1 %   Abs Immature Granulocytes 0.19 (H) 0.00 - 0.07 K/uL  Comprehensive metabolic panel     Status: Abnormal   Collection Time: 12/27/22  8:12 PM  Result Value Ref Range   Sodium 136 135 - 145 mmol/L   Potassium 4.2 3.5 - 5.1 mmol/L   Chloride 102 98 - 111 mmol/L   CO2 21 (L) 22 - 32 mmol/L   Glucose, Bld 179 (H) 70 - 99 mg/dL   BUN 16 8 - 23 mg/dL   Creatinine, Ser 6.64 (H) 0.44 - 1.00 mg/dL   Calcium 9.9 8.9 - 40.3 mg/dL   Total Protein 8.3 (H) 6.5 - 8.1 g/dL   Albumin 3.9 3.5 - 5.0 g/dL   AST 35 15 - 41 U/L   ALT 17 0 - 44 U/L    Alkaline Phosphatase 86 38 - 126 U/L   Total Bilirubin 1.0 0.3 - 1.2 mg/dL   GFR, Estimated 45 (L) >60 mL/min   Anion gap 13 5 - 15  Glucose, capillary     Status: Abnormal   Collection Time: 12/27/22 11:21 PM  Result Value Ref Range   Glucose-Capillary 153 (H) 70 - 99 mg/dL  CBC     Status: Abnormal   Collection Time: 12/28/22  2:35 AM  Result Value Ref Range   WBC 12.8 (H) 4.0 - 10.5 K/uL   RBC 3.69 (L) 3.87 - 5.11 MIL/uL   Hemoglobin 9.9 (L) 12.0 - 15.0 g/dL   HCT 16.1 (L) 09.6 - 04.5 %   MCV 82.4 80.0 - 100.0 fL   MCH 26.8 26.0 - 34.0 pg   MCHC 32.6 30.0 - 36.0 g/dL   RDW 40.9 (H) 81.1 - 91.4 %   Platelets 177 150 - 400 K/uL   nRBC 0.0 0.0 - 0.2 %  Basic metabolic panel     Status: Abnormal   Collection Time: 12/28/22  2:35 AM  Result Value Ref Range   Sodium 138 135 - 145 mmol/L   Potassium 3.2 (L) 3.5 - 5.1 mmol/L   Chloride 106 98 - 111 mmol/L   CO2 24 22 - 32 mmol/L   Glucose, Bld 167 (H) 70 - 99 mg/dL   BUN 13 8 - 23 mg/dL   Creatinine, Ser 7.82 0.44 - 1.00 mg/dL   Calcium 9.2 8.9 - 95.6 mg/dL   GFR, Estimated >21 >30 mL/min   Anion gap 8 5 - 15  Magnesium     Status: None   Collection Time: 12/28/22  2:35 AM  Result Value Ref Range   Magnesium 2.1 1.7 - 2.4 mg/dL  Phosphorus     Status: Abnormal   Collection Time: 12/28/22  2:35 AM  Result Value Ref Range   Phosphorus 2.2 (L) 2.5 - 4.6 mg/dL  MRSA Next Gen by PCR, Nasal     Status: None   Collection Time: 12/28/22  6:22 AM   Specimen: Nasal Mucosa; Nasal Swab  Result Value Ref Range   MRSA by PCR Next Gen NOT DETECTED NOT DETECTED  Glucose, capillary     Status: None   Collection Time: 12/28/22  7:52 AM  Result Value Ref Range   Glucose-Capillary 95 70 - 99 mg/dL  Glucose, capillary     Status: Abnormal   Collection Time: 12/28/22 12:22 PM  Result Value Ref Range   Glucose-Capillary 185 (H) 70 - 99 mg/dL    I have reviewed pertinent nursing notes, vitals, labs, and images as necessary. I have ordered  labwork to follow up on as indicated.  I have reviewed the last notes from staff over past 24 hours. I have discussed patient's care plan and test results with nursing staff, CM/SW, and other staff as appropriate.  Time spent: Greater than 50% of the 55 minute visit was spent in counseling/coordination of care for the patient as laid out in the A&P.   LOS: 1 day   Lewie Chamber, MD [facility] 12/28/2022, 12:31 PM

## 2022-12-28 NOTE — Hospital Course (Signed)
Jessica Yates is a 72 year old female with PMH lymphedema, depression/anxiety, lower extremity DVT, diastolic CHF, CKD 3, DM II, GERD, obesity, glaucoma, sickle cell trait who presented with left leg increased swelling, redness, warmth, and pain. She has history of cellulitis in both legs in the past.  Last episode of cellulitis involved right lower extremity.  She typically responds well to antibiotics. She was started on vancomycin and Rocephin.  Left lower extremity duplex was also obtained on admission which was negative for DVT.

## 2022-12-28 NOTE — Assessment & Plan Note (Signed)
-   Patient has chronic lower extremity lymphedema, typically worse swelling in left leg although she states it has acutely become tender and more red.  No open wounds appreciated on admission - Patient started on vancomycin and Rocephin.  Some improvement in pain this morning - Continue IV antibiotics through today and if still showing improvement, probable discharge tomorrow -Left lower extremity duplex negative for DVT

## 2022-12-28 NOTE — Assessment & Plan Note (Signed)
-   Leukocytosis, tachycardia, left lower extremity cellulitis

## 2022-12-28 NOTE — Progress Notes (Signed)
Left lower extremity venous study completed.   Preliminary results relayed to RN.  Please see CV Procedures for preliminary results.  Euretha Najarro, RVT  8:35 AM 12/28/22

## 2022-12-28 NOTE — Progress Notes (Signed)
Pharmacy Antibiotic Note  Jessica Yates is a 72 y.o. female admitted on 12/27/2022 with LLE cellulitis.  Pharmacy has been consulted for Vancomycin dosing. AKI noted; baseline Scr <0.8  Plan: Vancomycin 2gm IV x1 then  IV q48h to target AUC 400-550. Estimated AUC = 481 Monitor renal function and cx data   Height:  (170.2 cm) Weight: 99 kg (218 lb 4.1 oz) IBW/kg (Calculated) : 61.6  Temp (24hrs), Avg:100.9 F (38.3 C), Min:99.5 F (37.5 C), Max:102.3 F (39.1 C)  Recent Labs  Lab 12/27/22 2012  WBC 16.6*  CREATININE 1.27*    Estimated Creatinine Clearance: 48.4 mL/min (A) (by C-G formula based on SCr of 1.27 mg/dL (H)).    No Known Allergies  Antimicrobials this admission: 4/15 Rocephin >>  4/15 Vancomycin >>   Dose adjustments this admission:  Microbiology results:  Thank you for allowing pharmacy to be a part of this patient's care.  Junita Push PharmD 12/28/2022 1:13 AM

## 2022-12-28 NOTE — Assessment & Plan Note (Addendum)
-   patient has history of CKD3a. Baseline creat ~ 1.2, eGFR~ 50 - patient presents with increase in creat >0.3 mg/dL above baseline, creat increase >1.5x baseline presumed to have occurred within past 7 days PTA -Mild worsening from baseline with drop in GFR -Improved with fluids on admission

## 2022-12-28 NOTE — Progress Notes (Signed)
Patient in yellow MEWS upon admit to floor due to elevated blood pressure and temperature. Delma Post, MD made aware. PRN Tylenol administered, patient in no distress. Yellow MEWS guidelines implemented.  12/27/22 2339  Assess: MEWS Score  Temp (!) 102.3 F (39.1 C)  BP (!) 143/71  MAP (mmHg) 91  Pulse Rate 97  Resp 18  SpO2 97 %  O2 Device Room Air  Assess: MEWS Score  MEWS Temp 2  MEWS Systolic 0  MEWS Pulse 0  MEWS RR 0  MEWS LOC 0  MEWS Score 2  MEWS Score Color Yellow  Assess: if the MEWS score is Yellow or Red  Were vital signs taken at a resting state? Yes  Focused Assessment Change from prior assessment (see assessment flowsheet)  Does the patient meet 2 or more of the SIRS criteria? Yes  Does the patient have a confirmed or suspected source of infection? Yes  Provider and Rapid Response Notified? Yes  MEWS guidelines implemented  Yes, yellow  Treat  MEWS Interventions Considered administering scheduled or prn medications/treatments as ordered  Take Vital Signs  Increase Vital Sign Frequency  Yellow: Q2hr x1, continue Q4hrs until patient remains green for 12hrs  Escalate  MEWS: Escalate Yellow: Discuss with charge nurse and consider notifying provider and/or RRT  Notify: Charge Nurse/RN  Name of Charge Nurse/RN Notified Gareth Morgan, RN  Provider Notification  Provider Name/Title Dow Adolph, MD  Date Provider Notified 12/27/22  Time Provider Notified 2355  Method of Notification Page  Notification Reason Change in status  Provider response Other (Comment) (PRN Tylenol re-check temp)  Date of Provider Response 12/27/22  Time of Provider Response 0010  Assess: SIRS CRITERIA  SIRS Temperature  1  SIRS Pulse 1  SIRS Respirations  0  SIRS WBC 0  SIRS Score Sum  2

## 2022-12-28 NOTE — Assessment & Plan Note (Signed)
-   Continue home regimen 

## 2022-12-28 NOTE — Assessment & Plan Note (Signed)
-   Continue SSI and CBG monitoring ?

## 2022-12-28 NOTE — Assessment & Plan Note (Signed)
-   Chronic lower extremity lymphedema with chronically worse left-sided swelling compared to right

## 2022-12-28 NOTE — Assessment & Plan Note (Signed)
-   Continue Xalatan and timolol

## 2022-12-28 NOTE — Progress Notes (Signed)
Pharmacy Antibiotic Note  Jessica Yates is a 72 y.o. female admitted on 12/27/2022 with LLE cellulitis. Pharmacy has been consulted for Vancomycin dosing.  AKI noted; baseline Scr <0.8 - improving.  Plan: -Change vancomycin to 1000 mg IV q24h -Continue to monitor renal function, cultures and clinical progress for dose adjustments and de-escalation as indicated  Height:  (170.2 cm) Weight: 94.9 kg (209 lb 3.5 oz) IBW/kg (Calculated) : 61.6  Temp (24hrs), Avg:100.4 F (38 C), Min:99 F (37.2 C), Max:102.3 F (39.1 C)  Recent Labs  Lab 12/27/22 2012 12/28/22 0235  WBC 16.6* 12.8*  CREATININE 1.27* 0.95     Estimated Creatinine Clearance: 63.3 mL/min (by C-G formula based on SCr of 0.95 mg/dL).    No Known Allergies  Antimicrobials this admission: 4/15 Rocephin >>  4/15 Vancomycin >>    Microbiology results: 4/16 Bcx: pending 4/16 MRSA PCR: negative  Thank you for allowing pharmacy to be a part of this patient's care.  Pricilla Riffle, PharmD, BCPS Clinical Pharmacist 12/28/2022 8:04 AM

## 2022-12-29 DIAGNOSIS — L03116 Cellulitis of left lower limb: Secondary | ICD-10-CM | POA: Diagnosis not present

## 2022-12-29 DIAGNOSIS — N1831 Chronic kidney disease, stage 3a: Secondary | ICD-10-CM | POA: Insufficient documentation

## 2022-12-29 DIAGNOSIS — N179 Acute kidney failure, unspecified: Secondary | ICD-10-CM

## 2022-12-29 LAB — CBC WITH DIFFERENTIAL/PLATELET
Abs Immature Granulocytes: 0.11 10*3/uL — ABNORMAL HIGH (ref 0.00–0.07)
Basophils Absolute: 0 10*3/uL (ref 0.0–0.1)
Basophils Relative: 0 %
Eosinophils Absolute: 0.2 10*3/uL (ref 0.0–0.5)
Eosinophils Relative: 2 %
HCT: 31 % — ABNORMAL LOW (ref 36.0–46.0)
Hemoglobin: 9.8 g/dL — ABNORMAL LOW (ref 12.0–15.0)
Immature Granulocytes: 1 %
Lymphocytes Relative: 10 %
Lymphs Abs: 1 10*3/uL (ref 0.7–4.0)
MCH: 26.3 pg (ref 26.0–34.0)
MCHC: 31.6 g/dL (ref 30.0–36.0)
MCV: 83.1 fL (ref 80.0–100.0)
Monocytes Absolute: 0.8 10*3/uL (ref 0.1–1.0)
Monocytes Relative: 8 %
Neutro Abs: 8 10*3/uL — ABNORMAL HIGH (ref 1.7–7.7)
Neutrophils Relative %: 79 %
Platelets: 170 10*3/uL (ref 150–400)
RBC: 3.73 MIL/uL — ABNORMAL LOW (ref 3.87–5.11)
RDW: 16 % — ABNORMAL HIGH (ref 11.5–15.5)
WBC: 10 10*3/uL (ref 4.0–10.5)
nRBC: 0 % (ref 0.0–0.2)

## 2022-12-29 LAB — MAGNESIUM: Magnesium: 2.4 mg/dL (ref 1.7–2.4)

## 2022-12-29 LAB — BASIC METABOLIC PANEL
Anion gap: 6 (ref 5–15)
BUN: 10 mg/dL (ref 8–23)
CO2: 26 mmol/L (ref 22–32)
Calcium: 9.4 mg/dL (ref 8.9–10.3)
Chloride: 107 mmol/L (ref 98–111)
Creatinine, Ser: 0.84 mg/dL (ref 0.44–1.00)
GFR, Estimated: >60 mL/min (ref >60)
Glucose, Bld: 115 mg/dL — ABNORMAL HIGH (ref 70–99)
Potassium: 3.7 mmol/L (ref 3.5–5.1)
Sodium: 139 mmol/L (ref 135–145)

## 2022-12-29 LAB — GLUCOSE, CAPILLARY: Glucose-Capillary: 111 mg/dL — ABNORMAL HIGH (ref 70–99)

## 2022-12-29 LAB — CULTURE, BLOOD (ROUTINE X 2): Special Requests: ADEQUATE

## 2022-12-29 MED ORDER — CEPHALEXIN 500 MG PO CAPS: 500.0000 mg | ORAL_CAPSULE | Freq: Three times a day (TID) | ORAL | Status: DC

## 2022-12-29 MED ORDER — VANCOMYCIN HCL 1250 MG/250ML IV SOLN: 1250.0000 mg | INTRAVENOUS | Status: DC

## 2022-12-29 MED ORDER — DOXYCYCLINE HYCLATE 50 MG PO CAPS: 100.0000 mg | ORAL_CAPSULE | Freq: Two times a day (BID) | ORAL | 0 refills | Status: AC

## 2022-12-29 MED ORDER — CEPHALEXIN 500 MG PO CAPS: 500.0000 mg | ORAL_CAPSULE | Freq: Three times a day (TID) | ORAL | 0 refills | Status: DC

## 2022-12-29 MED ORDER — FUROSEMIDE 40 MG PO TABS: 40.0000 mg | ORAL_TABLET | Freq: Every day | ORAL | 0 refills | Status: DC

## 2022-12-29 NOTE — Progress Notes (Signed)
PHARMACY NOTE:  ANTIMICROBIAL RENAL DOSAGE ADJUSTMENT  Current antimicrobial regimen includes a mismatch between antimicrobial dosage and estimated renal function.  As per policy approved by the Pharmacy & Therapeutics and Medical Executive Committees, the antimicrobial dosage will be adjusted accordingly.  Current antimicrobial dosage:  vancomycin 1000 mg IV q24h  Indication: cellulitis  Renal Function:  Estimated Creatinine Clearance: 72.2 mL/min (by C-G formula based on SCr of 0.84 mg/dL).     Antimicrobial dosage has been changed to:  1250 mg IV q24h    Thank you for allowing pharmacy to be a part of this patient's care.  Pricilla Riffle, PharmD, BCPS Clinical Pharmacist 12/29/2022 8:40 AM

## 2022-12-29 NOTE — Discharge Summary (Addendum)
Physician Discharge Summary  Jessica Yates VHQ:[id number] DOB: 11/22/1950 DOA: 12/27/2022  PCP: Renaye Rakers, MD  Admit date: 12/27/2022 Discharge date: 12/29/2022  Admitted From: Home Disposition:  Home  Recommendations for Outpatient Follow-up:  Follow up with PCP  Discharge Condition: Stable CODE STATUS: Full  Diet recommendation:  Diet Orders (From admission, onward)     Start     Ordered   12/29/22 0000  Diet - low sodium heart healthy        12/29/22 1037   12/27/22 2135  Diet heart healthy/carb modified Room service appropriate? Yes; Fluid consistency: Thin  Diet effective now       Question Answer Comment  Diet-HS Snack? Nothing   Room service appropriate? Yes   Fluid consistency: Thin      12/27/22 2135            Brief/Interim Summary: Jessica Yates is a 72 year old female with PMH lymphedema, depression/anxiety, lower extremity DVT, diastolic CHF, CKD 3, DM II, GERD, obesity, glaucoma, sickle cell trait who presented with left leg increased swelling, redness, warmth, and pain. She has history of cellulitis in both legs in the past.  Last episode of cellulitis involved right lower extremity.  She typically responds well to antibiotics. She was started on vancomycin and Rocephin.  Left lower extremity duplex was also obtained on admission which was negative for DVT.  On day of discharge, patient had remained afebrile greater than 24 hours, vital signs stable.  Leukocytosis resolved.  Blood cultures negative to date.  Patient was sent home to finish course with doxycycline.  Discharge Diagnoses:   Principal Problem:   Cellulitis Active Problems:   Sepsis   Lymphedema   GERD (gastroesophageal reflux disease)   Primary open angle glaucoma of both eyes, mild stage   DM2 (diabetes mellitus, type 2)   AKI (acute kidney injury)   CKD stage 3a, GFR 45-59 ml/min Sepsis present on admission   Discharge Instructions  Discharge Instructions     Call MD for:   difficulty breathing, headache or visual disturbances   Complete by: As directed    Call MD for:  extreme fatigue   Complete by: As directed    Call MD for:  hives   Complete by: As directed    Call MD for:  persistant dizziness or light-headedness   Complete by: As directed    Call MD for:  persistant nausea and vomiting   Complete by: As directed    Call MD for:  severe uncontrolled pain   Complete by: As directed    Call MD for:  temperature >100.4   Complete by: As directed    Diet - low sodium heart healthy   Complete by: As directed    Discharge instructions   Complete by: As directed    You were cared for by a hospitalist during your hospital stay. If you have any questions about your discharge medications or the care you received while you were in the hospital after you are discharged, you can call the unit and ask to speak with the hospitalist on call if the hospitalist that took care of you is not available. Once you are discharged, your primary care physician will handle any further medical issues. Please note that NO REFILLS for any discharge medications will be authorized once you are discharged, as it is imperative that you return to your primary care physician (or establish a relationship with a primary care physician if you do not have one)  for your aftercare needs so that they can reassess your need for medications and monitor your lab values.   Increase activity slowly   Complete by: As directed       Allergies as of 12/29/2022   No Known Allergies      Medication List     TAKE these medications    acetaminophen 500 MG tablet Commonly known as: TYLENOL Take 1,000 mg by mouth every 6 (six) hours as needed for mild pain.   albuterol 108 (90 Base) MCG/ACT inhaler Commonly known as: VENTOLIN HFA Inhale 2 puffs into the lungs every 6 (six) hours as needed for wheezing or shortness of breath.   aspirin 81 MG chewable tablet Chew 81 mg by mouth at bedtime.    blood glucose meter kit and supplies Kit Dispense based on patient and insurance preference. Use up to four times daily as directed. (FOR ICD-9 250.00, 250.01).   COD LIVER OIL PO Take 1 tablet by mouth daily.   doxycycline 50 MG capsule Commonly known as: VIBRAMYCIN Take 2 capsules (100 mg total) by mouth 2 (two) times daily for 5 days.   famotidine 40 MG tablet Commonly known as: PEPCID TAKE 1 TABLET BY MOUTH EVERYDAY AT BEDTIME What changed: See the new instructions.   Farxiga 10 MG Tabs tablet Generic drug: dapagliflozin propanediol Take 10 mg by mouth daily before breakfast.   fexofenadine 180 MG tablet Commonly known as: ALLEGRA Take 180 mg by mouth daily.   FISH OIL PO Take 1 tablet by mouth daily.   fluticasone 50 MCG/ACT nasal spray Commonly known as: FLONASE Place 2 sprays into both nostrils daily. What changed:  when to take this reasons to take this   folic acid 1 MG tablet Commonly known as: FOLVITE Take 1 tablet (1 mg total) by mouth daily.   furosemide 40 MG tablet Commonly known as: LASIX Take 1 tablet (40 mg total) by mouth daily.   Gemtesa 75 MG Tabs Generic drug: Vibegron Take 75 mg by mouth daily.   iron polysaccharides 150 MG capsule Commonly known as: NIFEREX Take 1 capsule (150 mg total) by mouth daily.   losartan 25 MG tablet Commonly known as: COZAAR Take 12.5 mg by mouth See admin instructions. Take 12.5 mg by mouth only when taking Furosemide   RABEprazole 20 MG tablet Commonly known as: ACIPHEX TAKE 1 TABLET BY MOUTH TWICE A DAY (BEFORE BREAKFAST AND DINNER) What changed: See the new instructions.   rosuvastatin 40 MG tablet Commonly known as: CRESTOR TAKE 1 TABLET BY MOUTH EVERY DAY What changed: when to take this   timolol 0.5 % ophthalmic solution Commonly known as: TIMOPTIC Place 1 drop into both eyes in the morning and at bedtime.   Travatan Z 0.004 % Soln ophthalmic solution Generic drug: Travoprost (BAK  Free) Place 1 drop into both eyes at bedtime.   Vitamin D3 25 MCG (1000 UT) Caps Take 1,000 Units by mouth daily.        Follow-up Information     Renaye Rakers, MD Follow up.   Specialty: Family Medicine Contact information: [address] [place] [address] [phone]                No Known Allergies    Procedures/Studies: VAS [place] LOWER EXTREMITY VENOUS (DVT)  Result Date: 12/28/2022  Lower Venous DVT Study Patient Name:  Jessica Yates [facility]  Date of Exam:   12/28/2022 Medical Rec #: [id number]  Accession #:    [id number] Date of Birth: 04-20-1951          Patient Gender: F Patient Age:   72 years Exam Location:  [facility] Procedure:      VAS [place] LOWER EXTREMITY VENOUS (DVT) Referring Phys: CAROLE HALL --------------------------------------------------------------------------------  Indications: Left leg cellulitis, swelling, redness, tenderness, warmth.  Risk Factors: DVT Hx in 2002. Limitations: Poor ultrasound/tissue interface and body habitus. Comparison Study: Previous 09/26/21 negative. Performing Technologist: McKayla Maag RVT, [place]  Examination Guidelines: A complete evaluation includes B-mode imaging, spectral Doppler, color Doppler, and power Doppler as needed of all accessible portions of each vessel. Bilateral testing is considered an integral part of a complete examination. Limited examinations for reoccurring indications may be performed as noted. The reflux portion of the exam is performed with the patient in reverse Trendelenburg.  +-----+---------------+---------+-----------+----------+--------------+ RIGHTCompressibilityPhasicitySpontaneityPropertiesThrombus Aging +-----+---------------+---------+-----------+----------+--------------+ CFV  Full           Yes      Yes                                 +-----+---------------+---------+-----------+----------+--------------+ SFJ  Full                                                         +-----+---------------+---------+-----------+----------+--------------+   +---------+---------------+---------+-----------+----------+-------------------+ LEFT     CompressibilityPhasicitySpontaneityPropertiesThrombus Aging      +---------+---------------+---------+-----------+----------+-------------------+ CFV      Full                                                             +---------+---------------+---------+-----------+----------+-------------------+ SFJ      Full                                                             +---------+---------------+---------+-----------+----------+-------------------+ FV Prox  Full                                                             +---------+---------------+---------+-----------+----------+-------------------+ FV Mid   Full                                                             +---------+---------------+---------+-----------+----------+-------------------+ FV DistalFull                                                             +---------+---------------+---------+-----------+----------+-------------------+  PFV      Full                                                             +---------+---------------+---------+-----------+----------+-------------------+ POP      Full                                                             +---------+---------------+---------+-----------+----------+-------------------+ PTV      Full                                         Not well visualized +---------+---------------+---------+-----------+----------+-------------------+ PERO     Full                                         Not well visualized +---------+---------------+---------+-----------+----------+-------------------+     Summary: RIGHT: - No evidence of common femoral vein obstruction.  LEFT: - There is no evidence of deep vein thrombosis in the lower extremity. However, portions  of this examination were limited- see technologist comments above.  - No cystic structure found in the popliteal fossa. - Ultrasound characteristics of enlarged lymph nodes noted in the groin.  *See table(s) above for measurements and observations. Electronically signed by Lemar Livings MD on 12/28/2022 at 3:29:46 PM.    Final        Discharge Exam: Vitals:   12/28/22 2124 12/29/22 0422  BP: 105/66 105/69  Pulse: 83 75  Resp: 18 16  Temp: 99.2 F (37.3 C) 98.2 F (36.8 C)  SpO2: 99% 99%    General: Pt is alert, awake, not in acute distress Cardiovascular: RRR, S1/S2  Respiratory: CTA bilaterally, no wheezing, no rhonchi, no respiratory distress, no conversational dyspnea  Abdominal: Soft, NT, ND, bowel sounds + Extremities: Chronic lymphedema bilateral lower extremities, mild erythema and warmth left calf and left leg swelling greater than right Psych: Normal mood and affect, stable judgement and insight     The results of significant diagnostics from this hospitalization (including imaging, microbiology, ancillary and laboratory) are listed below for reference.     Microbiology: Recent Results (from the past 240 hour(s))  Culture, blood (Routine X 2) w Reflex to ID Panel     Status: None (Preliminary result)   Collection Time: 12/28/22  2:35 AM   Specimen: BLOOD  Result Value Ref Range Status   Specimen Description   Final    BLOOD BLOOD RIGHT HAND Performed at [facility], [address] [place], [place] [address]    Special Requests   Final    BOTTLES DRAWN AEROBIC AND ANAEROBIC Blood Culture adequate volume Performed at [facility], [address], [place], [place] [address]    Culture   Final    NO GROWTH 1 DAY Performed at [facility], [address], [place], [place] [address]    Report Status PENDING  Incomplete  Culture, blood (Routine  X 2) w Reflex to ID Panel     Status: None (Preliminary result)   Collection  Time: 12/28/22  2:37 AM   Specimen: BLOOD  Result Value Ref Range Status   Specimen Description   Final    BLOOD SITE NOT SPECIFIED Performed at [facility], [address] [place], [place] [address]    Special Requests   Final    BOTTLES DRAWN AEROBIC AND ANAEROBIC Blood Culture results may not be optimal due to an inadequate volume of blood received in culture bottles Performed at [facility], [address] [place], [place] [address]    Culture   Final    NO GROWTH 1 DAY Performed at [facility], [address], [place], [place] [address]    Report Status PENDING  Incomplete  MRSA Next Gen by PCR, Nasal     Status: None   Collection Time: 12/28/22  6:22 AM   Specimen: Nasal Mucosa; Nasal Swab  Result Value Ref Range Status   MRSA by PCR Next Gen NOT DETECTED NOT DETECTED Final    Comment: (NOTE) The GeneXpert MRSA Assay (FDA approved for NASAL specimens only), is one component of a comprehensive MRSA colonization surveillance program. It is not intended to diagnose MRSA infection nor to guide or monitor treatment for MRSA infections. Test performance is not FDA approved in patients less than 69 years old. Performed at [facility], [address] [place], [place] [address]      Labs: BNP (last 3 results) Recent Labs    11/18/22 0503  BNP 155.7*   Basic Metabolic Panel: Recent Labs  Lab 12/27/22 2012 12/28/22 0235 12/29/22 0549  NA 136 138 139  K 4.2 3.2* 3.7  CL 102 106 107  CO2 21* 24 26  GLUCOSE 179* 167* 115*  BUN 16 13 10   CREATININE 1.27* 0.95 0.84  CALCIUM 9.9 9.2 9.4  MG  --  2.1 2.4  PHOS  --  2.2*  --    Liver Function Tests: Recent Labs  Lab 12/27/22 2012  AST 35  ALT 17  ALKPHOS 86  BILITOT 1.0  PROT 8.3*  ALBUMIN 3.9   No results for input(s): "LIPASE", "AMYLASE" in the last 168 hours. No results for input(s): "AMMONIA" in the last 168 hours. CBC: Recent Labs  Lab  12/27/22 2012 12/28/22 0235 12/29/22 0549  WBC 16.6* 12.8* 10.0  NEUTROABS 15.3*  --  8.0*  HGB 12.1 9.9* 9.8*  HCT 38.6 30.4* 31.0*  MCV 84.5 82.4 83.1  PLT 206 177 170   Cardiac Enzymes: No results for input(s): "CKTOTAL", "CKMB", "CKMBINDEX", "TROPONINI" in the last 168 hours. BNP: Invalid input(s): "POCBNP" CBG: Recent Labs  Lab 12/28/22 0752 12/28/22 1222 12/28/22 1722 12/28/22 2121 12/29/22 0719  GLUCAP 95 185* 139* 144* 111*   D-Dimer No results for input(s): "DDIMER" in the last 72 hours. Hgb A1c No results for input(s): "HGBA1C" in the last 72 hours. Lipid Profile No results for input(s): "CHOL", "HDL", "LDLCALC", "TRIG", "CHOLHDL", "LDLDIRECT" in the last 72 hours. Thyroid function studies No results for input(s): "TSH", "T4TOTAL", "T3FREE", "THYROIDAB" in the last 72 hours.  Invalid input(s): "FREET3" Anemia work up No results for input(s): "VITAMINB12", "FOLATE", "FERRITIN", "TIBC", "IRON", "RETICCTPCT" in the last 72 hours. Urinalysis    Component Value Date/Time   COLORURINE STRAW (A) 11/17/2022 2303   APPEARANCEUR CLEAR 11/17/2022 2303   LABSPEC 1.018 11/17/2022 2303   PHURINE 7.0 11/17/2022 2303  GLUCOSEU >=500 (A) 11/17/2022 2303   HGBUR SMALL (A) 11/17/2022 2303   BILIRUBINUR NEGATIVE 11/17/2022 2303   KETONESUR NEGATIVE 11/17/2022 2303   PROTEINUR NEGATIVE 11/17/2022 2303   UROBILINOGEN 0.2 09/10/2020 2034   NITRITE POSITIVE (A) 11/17/2022 2303   LEUKOCYTESUR NEGATIVE 11/17/2022 2303   Sepsis Labs Recent Labs  Lab 12/27/22 2012 12/28/22 0235 12/29/22 0549  WBC 16.6* 12.8* 10.0   Microbiology Recent Results (from the past 240 hour(s))  Culture, blood (Routine X 2) w Reflex to ID Panel     Status: None (Preliminary result)   Collection Time: 12/28/22  2:35 AM   Specimen: BLOOD  Result Value Ref Range Status   Specimen Description   Final    BLOOD BLOOD RIGHT HAND Performed at [facility], [address]  [place], [place] [address]    Special Requests   Final    BOTTLES DRAWN AEROBIC AND ANAEROBIC Blood Culture adequate volume Performed at [facility], [address] [place], [place] [address]    Culture   Final    NO GROWTH 1 DAY Performed at [facility], [address] [place], [place] [address]    Report Status PENDING  Incomplete  Culture, blood (Routine X 2) w Reflex to ID Panel     Status: None (Preliminary result)   Collection Time: 12/28/22  2:37 AM   Specimen: BLOOD  Result Value Ref Range Status   Specimen Description   Final    BLOOD SITE NOT SPECIFIED Performed at [facility], [address], [place], [place] [address]    Special Requests   Final    BOTTLES DRAWN AEROBIC AND ANAEROBIC Blood Culture results may not be optimal due to an inadequate volume of blood received in culture bottles Performed at [facility], [address] [place], [place] [address]    Culture   Final    NO GROWTH 1 DAY Performed at [facility], [address] [place], [place] [address]    Report Status PENDING  Incomplete  MRSA Next Gen by PCR, Nasal     Status: None   Collection Time: 12/28/22  6:22 AM   Specimen: Nasal Mucosa; Nasal Swab  Result Value Ref Range Status   MRSA by PCR Next Gen NOT DETECTED NOT DETECTED Final    Comment: (NOTE) The GeneXpert MRSA Assay (FDA approved for NASAL specimens only), is one component of a comprehensive MRSA colonization surveillance program. It is not intended to diagnose MRSA infection nor to guide or monitor treatment for MRSA infections. Test performance is not FDA approved in patients less than 43 years old. Performed at [facility], [address] [place], [place] [address]      Patient was seen and examined on the day of discharge and was found to be in stable condition. Time coordinating discharge: 40 minutes including assessment and coordination of  care, as well as examination of the patient.   SIGNED:  Noralee Stain, DO [facility] 12/29/2022, 11:16 AM

## 2022-12-29 NOTE — TOC Progression Note (Signed)
Transition of Care [facility]) - Progression Note    Patient Details  Name: Jessica Yates MRN: [id number] Date of Birth: 02/05/1951  Transition of Care [facility]) CM/SW Contact  Beckie Busing, RN Phone Number:[phone]  12/29/2022, 10:32 AM  Clinical Narrative:     Transition of Care [facility]) Screening Note   Patient Details  Name: Jessica Yates Date of Birth: 1951/05/05   Transition of Care [facility]) CM/SW Contact:    Beckie Busing, RN Phone Number: 12/29/2022, 10:33 AM    Transition of Care Department [facility]) has reviewed patient and no TOC needs have been identified at this time. We will continue to monitor patient advancement through interdisciplinary progression rounds. If new patient transition needs arise, please place a TOC consult.          Expected Discharge Plan and Services                                               Social Determinants of Health (SDOH) Interventions SDOH Screenings   Food Insecurity: No Food Insecurity (12/27/2022)  Housing: Low Risk  (12/27/2022)  Transportation Needs: No Transportation Needs (12/27/2022)  Utilities: Not At Risk (12/27/2022)  Tobacco Use: Low Risk  (12/27/2022)    Readmission Risk Interventions     No data to display

## 2022-12-30 LAB — CULTURE, BLOOD (ROUTINE X 2): Culture: NO GROWTH

## 2022-12-31 LAB — CULTURE, BLOOD (ROUTINE X 2)

## 2023-01-01 LAB — CULTURE, BLOOD (ROUTINE X 2)

## 2023-01-02 LAB — CULTURE, BLOOD (ROUTINE X 2): Culture: NO GROWTH

## 2023-01-06 ENCOUNTER — Other Ambulatory Visit: Payer: Self-pay | Admitting: Physician Assistant

## 2023-01-07 DIAGNOSIS — E1169 Type 2 diabetes mellitus with other specified complication: Secondary | ICD-10-CM | POA: Diagnosis not present

## 2023-01-10 DIAGNOSIS — E669 Obesity, unspecified: Secondary | ICD-10-CM | POA: Diagnosis not present

## 2023-01-10 DIAGNOSIS — E1169 Type 2 diabetes mellitus with other specified complication: Secondary | ICD-10-CM | POA: Diagnosis not present

## 2023-01-10 DIAGNOSIS — E782 Mixed hyperlipidemia: Secondary | ICD-10-CM | POA: Diagnosis not present

## 2023-01-10 DIAGNOSIS — R4182 Altered mental status, unspecified: Secondary | ICD-10-CM | POA: Diagnosis not present

## 2023-01-10 DIAGNOSIS — I890 Lymphedema, not elsewhere classified: Secondary | ICD-10-CM | POA: Diagnosis not present

## 2023-01-10 DIAGNOSIS — L03115 Cellulitis of right lower limb: Secondary | ICD-10-CM | POA: Diagnosis not present

## 2023-01-10 DIAGNOSIS — K2100 Gastro-esophageal reflux disease with esophagitis, without bleeding: Secondary | ICD-10-CM | POA: Diagnosis not present

## 2023-01-28 ENCOUNTER — Encounter (HOSPITAL_COMMUNITY): Payer: Self-pay

## 2023-01-28 ENCOUNTER — Inpatient Hospital Stay (HOSPITAL_COMMUNITY)
Admission: EM | Admit: 2023-01-28 | Discharge: 2023-02-01 | DRG: 603 | Disposition: A | Discharge status: Home / Self Care | Payer: No Typology Code available for payment source | Attending: Internal Medicine | Admitting: Internal Medicine

## 2023-01-28 ENCOUNTER — Other Ambulatory Visit: Payer: Self-pay

## 2023-01-28 DIAGNOSIS — Z825 Family history of asthma and other chronic lower respiratory diseases: Secondary | ICD-10-CM

## 2023-01-28 DIAGNOSIS — Z90710 Acquired absence of both cervix and uterus: Secondary | ICD-10-CM

## 2023-01-28 DIAGNOSIS — E538 Deficiency of other specified B group vitamins: Secondary | ICD-10-CM | POA: Diagnosis present

## 2023-01-28 DIAGNOSIS — Z83438 Family history of other disorder of lipoprotein metabolism and other lipidemia: Secondary | ICD-10-CM

## 2023-01-28 DIAGNOSIS — I890 Lymphedema, not elsewhere classified: Secondary | ICD-10-CM | POA: Diagnosis present

## 2023-01-28 DIAGNOSIS — M79604 Pain in right leg: Principal | ICD-10-CM

## 2023-01-28 DIAGNOSIS — Z803 Family history of malignant neoplasm of breast: Secondary | ICD-10-CM

## 2023-01-28 DIAGNOSIS — Z833 Family history of diabetes mellitus: Secondary | ICD-10-CM

## 2023-01-28 DIAGNOSIS — N1831 Chronic kidney disease, stage 3a: Secondary | ICD-10-CM | POA: Diagnosis present

## 2023-01-28 DIAGNOSIS — L03115 Cellulitis of right lower limb: Secondary | ICD-10-CM | POA: Diagnosis not present

## 2023-01-28 DIAGNOSIS — Z7982 Long term (current) use of aspirin: Secondary | ICD-10-CM

## 2023-01-28 DIAGNOSIS — I5032 Chronic diastolic (congestive) heart failure: Secondary | ICD-10-CM | POA: Diagnosis present

## 2023-01-28 DIAGNOSIS — Z6833 Body mass index (BMI) 33.0-33.9, adult: Secondary | ICD-10-CM

## 2023-01-28 DIAGNOSIS — J45909 Unspecified asthma, uncomplicated: Secondary | ICD-10-CM | POA: Diagnosis present

## 2023-01-28 DIAGNOSIS — K219 Gastro-esophageal reflux disease without esophagitis: Secondary | ICD-10-CM | POA: Diagnosis present

## 2023-01-28 DIAGNOSIS — H401131 Primary open-angle glaucoma, bilateral, mild stage: Secondary | ICD-10-CM | POA: Diagnosis present

## 2023-01-28 DIAGNOSIS — E785 Hyperlipidemia, unspecified: Secondary | ICD-10-CM | POA: Diagnosis present

## 2023-01-28 DIAGNOSIS — D649 Anemia, unspecified: Secondary | ICD-10-CM | POA: Diagnosis present

## 2023-01-28 DIAGNOSIS — D509 Iron deficiency anemia, unspecified: Secondary | ICD-10-CM | POA: Diagnosis present

## 2023-01-28 DIAGNOSIS — E559 Vitamin D deficiency, unspecified: Secondary | ICD-10-CM | POA: Diagnosis present

## 2023-01-28 DIAGNOSIS — Z806 Family history of leukemia: Secondary | ICD-10-CM

## 2023-01-28 DIAGNOSIS — L0390 Cellulitis, unspecified: Secondary | ICD-10-CM

## 2023-01-28 DIAGNOSIS — Z86718 Personal history of other venous thrombosis and embolism: Secondary | ICD-10-CM

## 2023-01-28 DIAGNOSIS — E876 Hypokalemia: Secondary | ICD-10-CM | POA: Diagnosis present

## 2023-01-28 DIAGNOSIS — E669 Obesity, unspecified: Secondary | ICD-10-CM | POA: Diagnosis present

## 2023-01-28 DIAGNOSIS — Z8261 Family history of arthritis: Secondary | ICD-10-CM

## 2023-01-28 DIAGNOSIS — N179 Acute kidney failure, unspecified: Secondary | ICD-10-CM | POA: Diagnosis present

## 2023-01-28 DIAGNOSIS — D573 Sickle-cell trait: Secondary | ICD-10-CM | POA: Diagnosis present

## 2023-01-28 DIAGNOSIS — E1165 Type 2 diabetes mellitus with hyperglycemia: Secondary | ICD-10-CM | POA: Diagnosis present

## 2023-01-28 DIAGNOSIS — D6959 Other secondary thrombocytopenia: Secondary | ICD-10-CM | POA: Diagnosis present

## 2023-01-28 DIAGNOSIS — Z8249 Family history of ischemic heart disease and other diseases of the circulatory system: Secondary | ICD-10-CM

## 2023-01-28 DIAGNOSIS — E1122 Type 2 diabetes mellitus with diabetic chronic kidney disease: Secondary | ICD-10-CM | POA: Diagnosis present

## 2023-01-28 DIAGNOSIS — Z79899 Other long term (current) drug therapy: Secondary | ICD-10-CM

## 2023-01-28 LAB — CBC
HCT: 37.8 % (ref 36.0–46.0)
Hemoglobin: 11.8 g/dL — ABNORMAL LOW (ref 12.0–15.0)
MCH: 26.2 pg (ref 26.0–34.0)
MCHC: 31.2 g/dL (ref 30.0–36.0)
MCV: 83.8 fL (ref 80.0–100.0)
Platelets: 128 10*3/uL — ABNORMAL LOW (ref 150–400)
RBC: 4.51 MIL/uL (ref 3.87–5.11)
RDW: 17.1 % — ABNORMAL HIGH (ref 11.5–15.5)
WBC: 13.1 10*3/uL — ABNORMAL HIGH (ref 4.0–10.5)
nRBC: 0 % (ref 0.0–0.2)

## 2023-01-28 LAB — BASIC METABOLIC PANEL
Anion gap: 12 (ref 5–15)
BUN: 19 mg/dL (ref 8–23)
CO2: 19 mmol/L — ABNORMAL LOW (ref 22–32)
Calcium: 9.8 mg/dL (ref 8.9–10.3)
Chloride: 106 mmol/L (ref 98–111)
Creatinine, Ser: 1.25 mg/dL — ABNORMAL HIGH (ref 0.44–1.00)
GFR, Estimated: 46 mL/min — ABNORMAL LOW (ref >60)
Glucose, Bld: 229 mg/dL — ABNORMAL HIGH (ref 70–99)
Potassium: 3.4 mmol/L — ABNORMAL LOW (ref 3.5–5.1)
Sodium: 137 mmol/L (ref 135–145)

## 2023-01-28 LAB — PROTIME-INR
INR: 1.2 (ref 0.8–1.2)
Prothrombin Time: 15 seconds (ref 11.4–15.2)

## 2023-01-28 MED ORDER — DOXYCYCLINE HYCLATE 100 MG PO TABS: 100.0000 mg | ORAL_TABLET | Freq: Once | ORAL | Status: DC

## 2023-01-28 MED ORDER — DOXYCYCLINE HYCLATE 100 MG PO CAPS: 100.0000 mg | ORAL_CAPSULE | Freq: Two times a day (BID) | ORAL | 0 refills | Status: DC

## 2023-01-28 MED ORDER — CEPHALEXIN 500 MG PO CAPS: 500.0000 mg | ORAL_CAPSULE | Freq: Four times a day (QID) | ORAL | 0 refills | Status: DC

## 2023-01-28 MED ORDER — CEPHALEXIN 500 MG PO CAPS: 500.0000 mg | ORAL_CAPSULE | Freq: Once | ORAL | Status: DC

## 2023-01-28 MED ORDER — VANCOMYCIN HCL 2000 MG/400ML IV SOLN
2000.0000 mg | Freq: Once | INTRAVENOUS | Status: AC
  Administered 2023-01-29: 2000 mg via INTRAVENOUS
  Filled 2023-01-28: qty 400

## 2023-01-28 MED ORDER — HYDROCODONE-ACETAMINOPHEN 5-325 MG PO TABS
1.0000 | ORAL_TABLET | Freq: Once | ORAL | Status: AC
  Administered 2023-01-29: 1 via ORAL
  Filled 2023-01-28: qty 1

## 2023-01-28 NOTE — ED Provider Notes (Signed)
[place] EMERGENCY DEPARTMENT AT [facility] Provider Note   CSN: [id number] Arrival date & time: 01/28/23  1953     History  Chief Complaint  Patient presents with   Leg Pain    Jessica Yates is a 72 y.o. female.  HPI    72 year old female comes in with chief complaint of right leg swelling.  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  Patient has past medical history of lymphedema, CHF, CKD, diabetes, and she was admitted to the hospital for left leg cellulitis in April.  Patient comes in indicating that her right leg has been swollen and painful for the last day.  Patient denies any associated nausea, vomiting, fevers, chills.  She also denies any chest pain or shortness of breath.  Home Medications Prior to Admission medications   Medication Sig Start Date End Date Taking? Authorizing Provider  acetaminophen (TYLENOL) 500 MG tablet Take 1,000 mg by mouth every 6 (six) hours as needed for mild pain.     [provider]  albuterol (VENTOLIN HFA) 108 (90 Base) MCG/ACT inhaler Inhale 2 puffs into the lungs every 6 (six) hours as needed for wheezing or shortness of breath. 12/14/21   Hunsucker, Lesia Sago, MD  aspirin 81 MG chewable tablet Chew 81 mg by mouth at bedtime.    [provider]  blood glucose meter kit and supplies KIT Dispense based on patient and insurance preference. Use up to four times daily as directed. (FOR ICD-9 250.00, 250.01). 05/20/19   Darlin Drop, DO  Cholecalciferol (VITAMIN D3) 25 MCG (1000 UT) capsule Take 1,000 Units by mouth daily.    [provider]  COD LIVER OIL PO Take 1 tablet by mouth daily.    [provider]  dapagliflozin propanediol (FARXIGA) 10 MG TABS tablet Take 10 mg by mouth daily before breakfast.    [provider]  famotidine (PEPCID) 40 MG tablet Take 1 tablet (40 mg total) by mouth at bedtime. Please schedule a yearly  follow up for further refills. Thank you 01/06/23   Esterwood, Amy S, PA-C  fexofenadine (ALLEGRA) 180 MG tablet Take 180 mg by mouth daily. 11/17/22   [provider]  fluticasone (FLONASE) 50 MCG/ACT nasal spray Place 2 sprays into both nostrils daily. Patient taking differently: Place 2 sprays into both nostrils daily as needed for allergies or rhinitis. 07/02/22   Parrett, Virgel Bouquet, NP  folic acid (FOLVITE) 1 MG tablet Take 1 tablet (1 mg total) by mouth daily. 11/23/22   Marguerita Merles Latif, DO  furosemide (LASIX) 40 MG tablet Take 1 tablet (40 mg total) by mouth daily. 12/29/22   Noralee Stain, DO  GEMTESA 75 MG TABS Take 75 mg by mouth daily.    [provider]  iron polysaccharides (NIFEREX) 150 MG capsule Take 1 capsule (150 mg total) by mouth daily. 11/23/22   Marguerita Merles Latif, DO  losartan (COZAAR) 25 MG tablet Take 12.5 mg by mouth See admin instructions. Take 12.5 mg by mouth only when taking Furosemide 05/04/22   [provider]  Omega-3 Fatty Acids (FISH OIL PO) Take 1 tablet by mouth daily.    [provider]  RABEprazole (ACIPHEX) 20 MG tablet TAKE 1 TABLET BY MOUTH TWICE A DAY (BEFORE BREAKFAST AND DINNER) Patient taking differently: Take 20 mg by mouth 2 (two) times daily. 11/29/22   Esterwood, Amy S, PA-C  rosuvastatin (CRESTOR) 40  MG tablet TAKE 1 TABLET BY MOUTH EVERY DAY Patient taking differently: Take 40 mg by mouth at bedtime. 10/11/22   Dunn, Tacey Ruiz, PA-C  timolol (TIMOPTIC) 0.5 % ophthalmic solution Place 1 drop into both eyes in the morning and at bedtime. 07/16/21   [provider]  TRAVATAN Z 0.004 % SOLN ophthalmic solution Place 1 drop into both eyes at bedtime.  03/14/11   [provider]      Allergies    Patient has no known allergies.    Review of Systems   Review of Systems  All other systems reviewed and are negative.   Physical Exam Updated Vital Signs BP 102/60   Pulse 94   Temp 97.7 F (36.5 C)  (Oral)   Resp 18   Ht 5\' 7"  (1.702 m)   Wt 97.5 kg   SpO2 99%   BMI 33.67 kg/m  Physical Exam Vitals and nursing note reviewed.  Constitutional:      Appearance: She is well-developed.  HENT:     Head: Normocephalic and atraumatic.  Eyes:     Extraocular Movements: Extraocular movements intact.  Cardiovascular:     Rate and Rhythm: Normal rate.  Pulmonary:     Effort: Pulmonary effort is normal.  Musculoskeletal:        General: Tenderness present.     Cervical back: Normal range of motion and neck supple.     Right lower leg: Edema present.  Skin:    General: Skin is dry.     Findings: Erythema present.  Neurological:     Mental Status: She is alert and oriented to person, place, and time.     ED Results / Procedures / Treatments   Labs (all labs ordered are listed, but only abnormal results are displayed) Labs Reviewed  CBC - Abnormal; Notable for the following components:      Result Value   WBC 13.1 (*)    Hemoglobin 11.8 (*)    RDW 17.1 (*)    Platelets 128 (*)    All other components within normal limits  BASIC METABOLIC PANEL - Abnormal; Notable for the following components:   Potassium 3.4 (*)    CO2 19 (*)    Glucose, Bld 229 (*)    Creatinine, Ser 1.25 (*)    GFR, Estimated 46 (*)    All other components within normal limits  PROTIME-INR    EKG None  Radiology No results found.  Procedures Procedures    Medications Ordered in ED Medications  HYDROcodone-acetaminophen (NORCO/VICODIN) 5-325 MG per tablet 1 tablet (has no administration in time range)    ED Course/ Medical Decision Making/ A&P                             Medical Decision Making Amount and/or Complexity of Data Reviewed Labs: ordered.  Risk Prescription drug management.   72 year old female comes in with chief complaint of right leg swelling and pain.  Patient has past medical history of diabetes, CKD, lower extremity cellulitis, DVT and also chronic venous  insufficiency.  I reviewed patient's discharge summary from 417, she was admitted at that time for cellulitis.  Based on current evaluation, differential diagnosis includes cellulitis, DVT, stasis dermatitis.  Plan is to get ultrasound DVT, which if negative patient can be treated as cellulitis.  While she is in the emergency room, awaiting DVT study she will receive 1 dose of IV vancomycin.  I  have asked nursing staff to demarcate the erythematous rash.  Patient does not want to go home, as it is difficult for her to get back to the ER for ultrasound in the morning.  She has agreed to stay in the emergency room and get the ultrasound tomorrow morning.  Dr. Pilar Plate MADE AWARE.    Final Clinical Impression(s) / ED Diagnoses Final diagnoses:  Right leg pain    Rx / DC Orders ED Discharge Orders          Ordered    cephALEXin (KEFLEX) 500 MG capsule  4 times daily,   Status:  Discontinued        01/28/23 2339    doxycycline (VIBRAMYCIN) 100 MG capsule  2 times daily,   Status:  Discontinued        01/28/23 2339    LE Venous       Comments: IMPORTANT PATIENT INSTRUCTIONS:  You have been scheduled for an Outpatient Vascular Study at [facility]    If tomorrow is a Saturday, Sunday or holiday, please go to the [facility] Emergency Department Registration Desk at 11 am tomorrow morning and tell them you are there for a vascular study.   If tomorrow is a weekday (Monday-Friday), please go to [facility] Registration at 11 am and tell them you are there for a vascular study.   01/28/23 2340              Derwood Kaplan, MD 01/28/23 2351

## 2023-01-28 NOTE — ED Triage Notes (Signed)
Pt. Arrives c/o r. Leg pain. Pt. States that she noticed the pain yesterday so she put on her compression stockings. She pt.'s right leg is swollen compared to the left leg, it is red and warm to touch.

## 2023-01-28 NOTE — ED Notes (Signed)
Unsuccessful to start IV. Attempted x1. IV team consulted.

## 2023-01-29 ENCOUNTER — Encounter (HOSPITAL_COMMUNITY): Payer: Self-pay | Admitting: Internal Medicine

## 2023-01-29 ENCOUNTER — Ambulatory Visit (HOSPITAL_COMMUNITY)
Admit: 2023-01-29 | Discharge: 2023-01-29 | Disposition: A | Discharge status: Home / Self Care | Payer: No Typology Code available for payment source | Attending: Emergency Medicine | Admitting: Emergency Medicine

## 2023-01-29 DIAGNOSIS — R609 Edema, unspecified: Secondary | ICD-10-CM | POA: Diagnosis not present

## 2023-01-29 DIAGNOSIS — Z79899 Other long term (current) drug therapy: Secondary | ICD-10-CM | POA: Diagnosis not present

## 2023-01-29 DIAGNOSIS — I5032 Chronic diastolic (congestive) heart failure: Secondary | ICD-10-CM | POA: Diagnosis not present

## 2023-01-29 DIAGNOSIS — J45909 Unspecified asthma, uncomplicated: Secondary | ICD-10-CM | POA: Diagnosis not present

## 2023-01-29 DIAGNOSIS — D6959 Other secondary thrombocytopenia: Secondary | ICD-10-CM | POA: Diagnosis not present

## 2023-01-29 DIAGNOSIS — I890 Lymphedema, not elsewhere classified: Secondary | ICD-10-CM | POA: Diagnosis not present

## 2023-01-29 DIAGNOSIS — E669 Obesity, unspecified: Secondary | ICD-10-CM | POA: Diagnosis not present

## 2023-01-29 DIAGNOSIS — E876 Hypokalemia: Secondary | ICD-10-CM | POA: Diagnosis present

## 2023-01-29 DIAGNOSIS — D573 Sickle-cell trait: Secondary | ICD-10-CM | POA: Diagnosis not present

## 2023-01-29 DIAGNOSIS — E785 Hyperlipidemia, unspecified: Secondary | ICD-10-CM

## 2023-01-29 DIAGNOSIS — Z7982 Long term (current) use of aspirin: Secondary | ICD-10-CM | POA: Diagnosis not present

## 2023-01-29 DIAGNOSIS — E1165 Type 2 diabetes mellitus with hyperglycemia: Secondary | ICD-10-CM | POA: Diagnosis not present

## 2023-01-29 DIAGNOSIS — M79604 Pain in right leg: Secondary | ICD-10-CM | POA: Diagnosis not present

## 2023-01-29 DIAGNOSIS — E1122 Type 2 diabetes mellitus with diabetic chronic kidney disease: Secondary | ICD-10-CM | POA: Diagnosis not present

## 2023-01-29 DIAGNOSIS — Z8249 Family history of ischemic heart disease and other diseases of the circulatory system: Secondary | ICD-10-CM | POA: Diagnosis not present

## 2023-01-29 DIAGNOSIS — H401131 Primary open-angle glaucoma, bilateral, mild stage: Secondary | ICD-10-CM | POA: Diagnosis not present

## 2023-01-29 DIAGNOSIS — K219 Gastro-esophageal reflux disease without esophagitis: Secondary | ICD-10-CM | POA: Diagnosis not present

## 2023-01-29 DIAGNOSIS — Z833 Family history of diabetes mellitus: Secondary | ICD-10-CM | POA: Diagnosis not present

## 2023-01-29 DIAGNOSIS — Z8261 Family history of arthritis: Secondary | ICD-10-CM | POA: Diagnosis not present

## 2023-01-29 DIAGNOSIS — Z803 Family history of malignant neoplasm of breast: Secondary | ICD-10-CM | POA: Diagnosis not present

## 2023-01-29 DIAGNOSIS — Z86718 Personal history of other venous thrombosis and embolism: Secondary | ICD-10-CM | POA: Diagnosis not present

## 2023-01-29 DIAGNOSIS — E538 Deficiency of other specified B group vitamins: Secondary | ICD-10-CM | POA: Diagnosis not present

## 2023-01-29 DIAGNOSIS — Z6833 Body mass index (BMI) 33.0-33.9, adult: Secondary | ICD-10-CM | POA: Diagnosis not present

## 2023-01-29 DIAGNOSIS — N1831 Chronic kidney disease, stage 3a: Secondary | ICD-10-CM | POA: Diagnosis not present

## 2023-01-29 DIAGNOSIS — L03115 Cellulitis of right lower limb: Secondary | ICD-10-CM | POA: Diagnosis not present

## 2023-01-29 DIAGNOSIS — D509 Iron deficiency anemia, unspecified: Secondary | ICD-10-CM | POA: Diagnosis not present

## 2023-01-29 HISTORY — DX: Hyperlipidemia, unspecified: E78.5

## 2023-01-29 LAB — COMPREHENSIVE METABOLIC PANEL
ALT: 25 U/L (ref 0–44)
AST: 24 U/L (ref 15–41)
Albumin: 3.1 g/dL — ABNORMAL LOW (ref 3.5–5.0)
Alkaline Phosphatase: 78 U/L (ref 38–126)
Anion gap: 8 (ref 5–15)
BUN: 18 mg/dL (ref 8–23)
CO2: 21 mmol/L — ABNORMAL LOW (ref 22–32)
Calcium: 9.9 mg/dL (ref 8.9–10.3)
Chloride: 106 mmol/L (ref 98–111)
Creatinine, Ser: 1.14 mg/dL — ABNORMAL HIGH (ref 0.44–1.00)
GFR, Estimated: 51 mL/min — ABNORMAL LOW (ref >60)
Glucose, Bld: 210 mg/dL — ABNORMAL HIGH (ref 70–99)
Potassium: 3.6 mmol/L (ref 3.5–5.1)
Sodium: 135 mmol/L (ref 135–145)
Total Bilirubin: 0.7 mg/dL (ref 0.3–1.2)
Total Protein: 7 g/dL (ref 6.5–8.1)

## 2023-01-29 LAB — CBC WITH DIFFERENTIAL/PLATELET
Abs Immature Granulocytes: 0.14 10*3/uL — ABNORMAL HIGH (ref 0.00–0.07)
Basophils Absolute: 0 10*3/uL (ref 0.0–0.1)
Basophils Relative: 0 %
Eosinophils Absolute: 0 10*3/uL (ref 0.0–0.5)
Eosinophils Relative: 0 %
HCT: 34.2 % — ABNORMAL LOW (ref 36.0–46.0)
Hemoglobin: 11 g/dL — ABNORMAL LOW (ref 12.0–15.0)
Immature Granulocytes: 1 %
Lymphocytes Relative: 6 %
Lymphs Abs: 0.8 10*3/uL (ref 0.7–4.0)
MCH: 26.3 pg (ref 26.0–34.0)
MCHC: 32.2 g/dL (ref 30.0–36.0)
MCV: 81.6 fL (ref 80.0–100.0)
Monocytes Absolute: 0.5 10*3/uL (ref 0.1–1.0)
Monocytes Relative: 4 %
Neutro Abs: 10.9 10*3/uL — ABNORMAL HIGH (ref 1.7–7.7)
Neutrophils Relative %: 89 %
Platelets: 88 10*3/uL — ABNORMAL LOW (ref 150–400)
RBC: 4.19 MIL/uL (ref 3.87–5.11)
RDW: 17 % — ABNORMAL HIGH (ref 11.5–15.5)
WBC: 12.4 10*3/uL — ABNORMAL HIGH (ref 4.0–10.5)
nRBC: 0 % (ref 0.0–0.2)

## 2023-01-29 LAB — GLUCOSE, CAPILLARY
Glucose-Capillary: 261 mg/dL — ABNORMAL HIGH (ref 70–99)
Glucose-Capillary: 135 mg/dL — ABNORMAL HIGH (ref 70–99)
Glucose-Capillary: 117 mg/dL — ABNORMAL HIGH (ref 70–99)

## 2023-01-29 LAB — MAGNESIUM: Magnesium: 2 mg/dL (ref 1.7–2.4)

## 2023-01-29 LAB — PHOSPHORUS: Phosphorus: 1.6 mg/dL — ABNORMAL LOW (ref 2.5–4.6)

## 2023-01-29 MED ORDER — OXYCODONE HCL 5 MG PO TABS
5.0000 mg | ORAL_TABLET | ORAL | Status: DC | PRN
  Administered 2023-01-29 – 2023-01-31 (×3): 5 mg via ORAL
  Filled 2023-01-29 (×3): qty 1

## 2023-01-29 MED ORDER — INSULIN ASPART 100 UNIT/ML IJ SOLN
0.0000 [IU] | Freq: Three times a day (TID) | INTRAMUSCULAR | Status: DC
  Administered 2023-01-29: 11 [IU] via SUBCUTANEOUS
  Administered 2023-01-30: 3 [IU] via SUBCUTANEOUS
  Administered 2023-01-31: 4 [IU] via SUBCUTANEOUS
  Administered 2023-01-31 – 2023-02-01 (×2): 7 [IU] via SUBCUTANEOUS

## 2023-01-29 MED ORDER — SODIUM CHLORIDE 0.45 % IV SOLN: INTRAVENOUS | Status: DC

## 2023-01-29 MED ORDER — TIMOLOL MALEATE 0.5 % OP SOLN
1.0000 [drp] | Freq: Two times a day (BID) | OPHTHALMIC | Status: DC
  Administered 2023-01-29 – 2023-02-01 (×7): 1 [drp] via OPHTHALMIC
  Filled 2023-01-29: qty 5

## 2023-01-29 MED ORDER — FAMOTIDINE 20 MG PO TABS
20.0000 mg | ORAL_TABLET | Freq: Every day | ORAL | Status: DC
  Administered 2023-01-29 – 2023-01-31 (×3): 20 mg via ORAL
  Filled 2023-01-29 (×3): qty 1

## 2023-01-29 MED ORDER — ROSUVASTATIN CALCIUM 20 MG PO TABS
40.0000 mg | ORAL_TABLET | Freq: Every day | ORAL | Status: DC
  Administered 2023-01-29 – 2023-01-31 (×3): 40 mg via ORAL
  Filled 2023-01-29 (×3): qty 2

## 2023-01-29 MED ORDER — LATANOPROST 0.005 % OP SOLN
1.0000 [drp] | Freq: Every day | OPHTHALMIC | Status: DC
  Administered 2023-01-29 – 2023-01-31 (×3): 1 [drp] via OPHTHALMIC
  Filled 2023-01-29: qty 2.5

## 2023-01-29 MED ORDER — SODIUM CHLORIDE 0.9 % IV SOLN
2.0000 g | INTRAVENOUS | Status: DC
  Administered 2023-01-29 – 2023-02-01 (×4): 2 g via INTRAVENOUS
  Filled 2023-01-29 (×4): qty 20

## 2023-01-29 MED ORDER — ASPIRIN 81 MG PO TBEC
81.0000 mg | DELAYED_RELEASE_TABLET | Freq: Every day | ORAL | Status: DC
  Administered 2023-01-29 – 2023-01-31 (×3): 81 mg via ORAL
  Filled 2023-01-29 (×3): qty 1

## 2023-01-29 MED ORDER — FAMOTIDINE 20 MG PO TABS: 40.0000 mg | ORAL_TABLET | Freq: Every day | ORAL | Status: DC

## 2023-01-29 MED ORDER — POTASSIUM CHLORIDE CRYS ER 20 MEQ PO TBCR
40.0000 meq | EXTENDED_RELEASE_TABLET | Freq: Once | ORAL | Status: AC
  Administered 2023-01-29: 40 meq via ORAL
  Filled 2023-01-29: qty 2

## 2023-01-29 MED ORDER — VANCOMYCIN HCL IN DEXTROSE 1-5 GM/200ML-% IV SOLN
1000.0000 mg | Freq: Once | INTRAVENOUS | Status: AC
  Administered 2023-01-29: 1000 mg via INTRAVENOUS
  Filled 2023-01-29: qty 200

## 2023-01-29 MED ORDER — ACETAMINOPHEN 325 MG PO TABS
650.0000 mg | ORAL_TABLET | Freq: Four times a day (QID) | ORAL | Status: DC | PRN
  Administered 2023-02-01: 650 mg via ORAL
  Filled 2023-01-29: qty 2

## 2023-01-29 MED ORDER — ENOXAPARIN SODIUM 40 MG/0.4ML IJ SOSY
40.0000 mg | PREFILLED_SYRINGE | INTRAMUSCULAR | Status: DC
  Administered 2023-01-29 – 2023-01-31 (×3): 40 mg via SUBCUTANEOUS
  Filled 2023-01-29 (×3): qty 0.4

## 2023-01-29 MED ORDER — ACETAMINOPHEN 650 MG RE SUPP: 650.0000 mg | Freq: Four times a day (QID) | RECTAL | Status: DC | PRN

## 2023-01-29 MED ORDER — VANCOMYCIN HCL 1250 MG/250ML IV SOLN
1250.0000 mg | INTRAVENOUS | Status: DC
  Administered 2023-01-30: 1250 mg via INTRAVENOUS
  Filled 2023-01-29: qty 250

## 2023-01-29 NOTE — Progress Notes (Signed)
Pharmacy Antibiotic Note  Jessica Yates is a 72 y.o. female admitted on 01/28/2023 with chief complaint of right leg swelling.  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  Patient has past medical history of lymphedema, CHF, CKD, diabetes, and she was admitted to the hospital for left leg cellulitis in April .  Pharmacy has been consulted for vancomycin dosing.  Plan: Vancomycin 2gm IV x 1 then 1250mg  q36h (AUC 459.1, Scr 1.25) Follow renal function and clinical course  Height: 5\' 7"  (170.2 cm) Weight: 97.5 kg (215 lb) IBW/kg (Calculated) : 61.6  Temp (24hrs), Avg:97.7 F (36.5 C), Min:97.7 F (36.5 C), Max:97.7 F (36.5 C)  Recent Labs  Lab 01/28/23 2148  WBC 13.1*  CREATININE 1.25*    Estimated Creatinine Clearance: 48.8 mL/min (A) (by C-G formula based on SCr of 1.25 mg/dL (H)).    No Known Allergies  Antimicrobials this admission: 5/18 vanc >>  Dose adjustments this admission:   Microbiology results:   Thank you for allowing pharmacy to be a part of this patient's care.  Arley Phenix RPh 01/29/2023, 12:04 AM

## 2023-01-29 NOTE — ED Notes (Signed)
ED TO INPATIENT HANDOFF REPORT  ED Nurse Name and Phone #: Arnetha Gula Name/Age/Gender Jessica Yates 72 y.o. female Room/Bed: WA23/WA23  Code Status   Code Status: Full Code  Home/SNF/Other Nursing Home Patient oriented to: self Is this baseline? Yes   Triage Complete: Triage complete  Chief Complaint Cellulitis of right lower extremity [L03.115]  Triage Note Pt. Arrives c/o r. Leg pain. Pt. States that she noticed the pain yesterday so she put on her compression stockings. She pt.'s right leg is swollen compared to the left leg, it is red and warm to touch.   Allergies No Known Allergies  Level of Care/Admitting Diagnosis ED Disposition     ED Disposition  Admit   Condition  --   Comment  Hospital Area: [facility] [[id number]]  Level of Care: Med-Surg [16]  May admit patient to [facility] or [facility] if equivalent level of care is available:: No  Covid Evaluation: Asymptomatic - no recent exposure (last 10 days) testing not required  Diagnosis: Cellulitis of right lower extremity [161096]  Admitting Physician: Bobette Mo [[id number]]  Attending Physician: Bobette Mo [[id number]]  Certification:: I certify this patient will need inpatient services for at least 2 midnights  Estimated Length of Stay: 2          B Medical/Surgery History Past Medical History:  Diagnosis Date   Allergic rhinitis    Anemia 2002   Anxiety    Arthritis    Asthma    Blood clot in vein    Cellulitis    Cellulitis of right lower extremity    Chronic diastolic (congestive) heart failure (HCC)    Chronic kidney disease, stage 3 (HCC)    Coagulation defect (HCC)    Cough    Depression 02/06/2008   Diabetes mellitus, type 2 (HCC)    DM2 (diabetes mellitus, type 2) (HCC) 11/18/2022   DVT (deep venous thrombosis) (HCC) 2002   left leg   Dysphagia    Elevated hemoglobin A1c    GERD (gastroesophageal reflux disease)    Glaucoma     Holosystolic murmur    Kidney cysts    Leukocytosis    Liver cyst    Lymphedema    Morbid obesity (HCC)    Overactive bladder    Palpitations    Peripheral venous insufficiency    Primary open angle glaucoma (POAG) of both eyes, mild stage    Seasonal allergies    Sickle cell trait (HCC)    Tortuous aorta (HCC)    Unstable gait    Upper airway cough syndrome    Vitamin D deficiency    Past Surgical History:  Procedure Laterality Date   COSMETIC SURGERY  1975   nasal reconstruction   TOTAL ABDOMINAL HYSTERECTOMY  07/2001     A IV Location/Drains/Wounds Patient Lines/Drains/Airways Status     Active Line/Drains/Airways     Name Placement date Placement time Site Days   Peripheral IV 01/29/23 20 G Left Antecubital 01/29/23  0036  Antecubital  less than 1            Intake/Output Last 24 hours  Intake/Output Summary (Last 24 hours) at 01/29/2023 1131 Last data filed at 01/29/2023 0247 Gross per 24 hour  Intake 400 ml  Output --  Net 400 ml    Labs/Imaging Results for orders placed or performed during the hospital encounter of 01/28/23 (from the past 48 hour(s))  CBC     Status: Abnormal  Collection Time: 01/28/23  9:48 PM  Result Value Ref Range   WBC 13.1 (H) 4.0 - 10.5 K/uL   RBC 4.51 3.87 - 5.11 MIL/uL   Hemoglobin 11.8 (L) 12.0 - 15.0 g/dL   HCT 16.1 09.6 - 04.5 %   MCV 83.8 80.0 - 100.0 fL   MCH 26.2 26.0 - 34.0 pg   MCHC 31.2 30.0 - 36.0 g/dL   RDW 40.9 (H) 81.1 - 91.4 %   Platelets 128 (L) 150 - 400 K/uL   nRBC 0.0 0.0 - 0.2 %    Comment: Performed at [facility], [address], [place], [place] [address]  Basic metabolic panel     Status: Abnormal   Collection Time: 01/28/23  9:48 PM  Result Value Ref Range   Sodium 137 135 - 145 mmol/L   Potassium 3.4 (L) 3.5 - 5.1 mmol/L   Chloride 106 98 - 111 mmol/L   CO2 19 (L) 22 - 32 mmol/L   Glucose, Bld 229 (H) 70 - 99 mg/dL    Comment: Glucose reference range applies only to  samples taken after fasting for at least 8 hours.   BUN 19 8 - 23 mg/dL   Creatinine, Ser 6.21 (H) 0.44 - 1.00 mg/dL   Calcium 9.8 8.9 - 30.8 mg/dL   GFR, Estimated 46 (L) >60 mL/min    Comment: (NOTE) Calculated using the CKD-EPI Creatinine Equation (2021)    Anion gap 12 5 - 15    Comment: Performed at [facility], [address], [place], [place] [address]  Protime-INR     Status: None   Collection Time: 01/28/23 10:00 PM  Result Value Ref Range   Prothrombin Time 15.0 11.4 - 15.2 seconds   INR 1.2 0.8 - 1.2    Comment: (NOTE) INR goal varies based on device and disease states. Performed at [facility], [address] [place], [place] [address]    LE Venous  Result Date: 01/29/2023  Lower Venous DVT Study Patient Name:  SAFA KOWNACKI [facility]  Date of Exam:   01/29/2023 Medical Rec #: [id number]          Accession #:    [id number] Date of Birth: July 02, 1951          Patient Gender: F Patient Age:   60 years Exam Location:  [facility] Procedure:      VAS [place] LOWER EXTREMITY VENOUS (DVT) Referring Phys: Janey Genta NANAVATI --------------------------------------------------------------------------------  Indications: Pain, swelling, redness of anterior aspect of calf x 2 days.  Risk Factors: History of lymphedema, history of cellulitis, reported history of LLE DVT in 2002 with +10 negative studies since this time. Comparison Study: 12-28-2022 Negative left lower extremity venous.                   11-18-2022 Negative right lower extremity venous.                   07-15-2022 Left LE reflux study negative for DVT.                   09-26-2021 Negative left lower extremity venous.                   07-24-2021 Bilateral LE reflux study negative for DVT.                   08-06-2019 Negative right lower extremity venous.  07-30-2019 Negative left lower extremity venous.                   05-12-2019 Negative bilateral lower extremity venous.                    07-23-2018 Negative left lower extremity venous. Performing Technologist: Jean Rosenthal RDMS, RVT  Examination Guidelines: A complete evaluation includes B-mode imaging, spectral Doppler, color Doppler, and power Doppler as needed of all accessible portions of each vessel. Bilateral testing is considered an integral part of a complete examination. Limited examinations for reoccurring indications may be performed as noted. The reflux portion of the exam is performed with the patient in reverse Trendelenburg.  +---------+---------------+---------+-----------+----------+-------------------+ RIGHT    CompressibilityPhasicitySpontaneityPropertiesThrombus Aging      +---------+---------------+---------+-----------+----------+-------------------+ CFV      Full           Yes      Yes                                      +---------+---------------+---------+-----------+----------+-------------------+ SFJ      Full                                                             +---------+---------------+---------+-----------+----------+-------------------+ FV Prox  Full                                                             +---------+---------------+---------+-----------+----------+-------------------+ FV Mid   Full                                                             +---------+---------------+---------+-----------+----------+-------------------+ FV DistalFull                                                             +---------+---------------+---------+-----------+----------+-------------------+ PFV      Full                                                             +---------+---------------+---------+-----------+----------+-------------------+ POP      Full           Yes      Yes                                      +---------+---------------+---------+-----------+----------+-------------------+ PTV      Full                                                              +---------+---------------+---------+-----------+----------+-------------------+  PERO     Full                                         Poor visualization                                                        secondary to                                                              overlying edema,                                                          patient pain, skin                                                        changes, and tissue                                                       properties.         +---------+---------------+---------+-----------+----------+-------------------+   +----+---------------+---------+-----------+----------+--------------+ LEFTCompressibilityPhasicitySpontaneityPropertiesThrombus Aging +----+---------------+---------+-----------+----------+--------------+ CFV Full           Yes      Yes                                 +----+---------------+---------+-----------+----------+--------------+     Summary: RIGHT: - There is no evidence of deep vein thrombosis in the lower extremity. However, portions of this examination were limited- see technologist comments above.  - No cystic structure found in the popliteal fossa.  LEFT: - No evidence of common femoral vein obstruction.  *See table(s) above for measurements and observations.    Preliminary     Pending Labs Unresulted Labs (From admission, onward)     Start     Ordered   01/30/23 0500  Comprehensive metabolic panel  Tomorrow morning,   R        01/29/23 1046   01/30/23 0500  CBC  Tomorrow morning,   R        01/29/23 1046   01/29/23 1042  CBC with Differential/Platelet  Once,   R        01/29/23 1046   01/29/23 1042  Comprehensive metabolic panel  Once,   R        01/29/23 1046            Vitals/Pain Today's Vitals   01/29/23 0559 01/29/23 0600 01/29/23  0945 01/29/23 1100  BP: 108/66 108/66 109/64 95/65   Pulse: 93 89 92 90  Resp: 16  18   Temp: 98.8 F (37.1 C)  98.4 F (36.9 C)   TempSrc: Oral  Oral   SpO2: 98% 98% 97% 100%  Weight:      Height:      PainSc:        Isolation Precautions No active isolations  Medications Medications  vancomycin (VANCOREADY) IVPB 1250 mg/250 mL (has no administration in time range)  vancomycin (VANCOCIN) IVPB 1000 mg/200 mL premix (1,000 mg Intravenous New Bag/Given 01/29/23 1038)  cefTRIAXone (ROCEPHIN) 2 g in sodium chloride 0.9 % 100 mL IVPB (has no administration in time range)  enoxaparin (LOVENOX) injection 40 mg (has no administration in time range)  acetaminophen (TYLENOL) tablet 650 mg (has no administration in time range)    Or  acetaminophen (TYLENOL) suppository 650 mg (has no administration in time range)  oxyCODONE (Oxy IR/ROXICODONE) immediate release tablet 5 mg (has no administration in time range)  HYDROcodone-acetaminophen (NORCO/VICODIN) 5-325 MG per tablet 1 tablet (1 tablet Oral Given 01/29/23 0036)  vancomycin (VANCOREADY) IVPB 2000 mg/400 mL (0 mg Intravenous Stopped 01/29/23 0247)    Mobility non-ambulatory     Focused Assessments Cardiac Assessment Handoff:    No results found for: "CKTOTAL", "CKMB", "CKMBINDEX", "TROPONINI" Lab Results  Component Value Date   DDIMER 1.74 (H) 07/22/2018   Does the Patient currently have chest pain? No    R Recommendations: See Admitting Provider Note  Report given to:   Additional Notes:

## 2023-01-29 NOTE — H&P (Signed)
History and Physical    Patient: Jessica Yates NWG:[id number] DOB: 12-28-1950 DOA: 01/28/2023 DOS: the patient was seen and examined on 01/29/2023 PCP: Renaye Rakers, MD  Patient coming from: Home  Chief Complaint:  Chief Complaint  Patient presents with   Leg Pain   HPI: Jessica Yates is a 72 y.o. female with medical history significant of allergic rhinitis, anemia, anxiety, depression, osteoarthritis, asthma, chronic diastolic heart failure, stage III CKD, unspecified coagulation defect, type 2 diabetes, hyperlipidemia, dysphagia, GERD, glaucoma, holosystolic murmur, renal cyst, liver cyst, lymphedema, and previous morbid obesity currently with a BMI of 33.67 kg/m, overactive bladder, palpitations, sickle cell trait, tortuous aorta, upper airway cough syndrome, vitamin D deficiency, left lower extremity DVT, cellulitis of the right lower extremities who is coming with edema, erythema and tenderness of her right lower extremity since 2 days ago, associated with low-grade fever.  No history of trauma to the area. No chills or night sweats. No sore throat, rhinorrhea, dyspnea, wheezing or hemoptysis.  No chest pain, palpitations, diaphoresis, PND, orthopnea, but has frequent pitting edema of the lower extremities.  No appetite changes, abdominal pain, diarrhea, constipation, melena or hematochezia.  No flank pain, dysuria, frequency or hematuria.  No polyuria, polydipsia, polyphagia or blurred vision.  Lab work: Her CBC showed white count 13.1, hemoglobin 11.8 g/deciliter platelets 128.  PT and INR were normal.  Potassium is 3.4 and CO2 19 mmol/L.  The rest of the electrolytes were normal.  Glucose 229, BUN 19 and creatinine 1.25 mg/dL.  Imaging: Bilateral lower extremity Doppler shows no evidence of DVT.   ED course: Initial vital signs were temperature 97.7 F, pulse 98, respirations 16, BP 114/97 mmHg and O2 sat 96% on room air.  The patient received vancomycin 2000 mg IVPB and 1 tablet  of hydrocodone/APAP 5/325 mg p.o.  Review of Systems: As mentioned in the history of present illness. All other systems reviewed and are negative. Past Medical History:  Diagnosis Date   Allergic rhinitis    Anemia 2002   Anxiety    Arthritis    Asthma    Blood clot in vein    Cellulitis    Cellulitis of right lower extremity    Chronic diastolic (congestive) heart failure (HCC)    Chronic kidney disease, stage 3 (HCC)    Coagulation defect (HCC)    Cough    Depression 02/06/2008   Diabetes mellitus, type 2 (HCC)    DM2 (diabetes mellitus, type 2) (HCC) 11/18/2022   DVT (deep venous thrombosis) (HCC) 2002   left leg   Dysphagia    Elevated hemoglobin A1c    GERD (gastroesophageal reflux disease)    Glaucoma    Holosystolic murmur    Kidney cysts    Leukocytosis    Liver cyst    Lymphedema    Morbid obesity (HCC)    Overactive bladder    Palpitations    Peripheral venous insufficiency    Primary open angle glaucoma (POAG) of both eyes, mild stage    Seasonal allergies    Sickle cell trait (HCC)    Tortuous aorta (HCC)    Unstable gait    Upper airway cough syndrome    Vitamin D deficiency    Past Surgical History:  Procedure Laterality Date   COSMETIC SURGERY  1975   nasal reconstruction   TOTAL ABDOMINAL HYSTERECTOMY  07/2001   Social History:  reports that she has never smoked. She has never used smokeless tobacco. She reports that  she does not drink alcohol and does not use drugs.  No Known Allergies  Family History  Problem Relation Age of Onset   Allergies Mother    Asthma Mother    Other Mother        enlarged heart   Arthritis Mother    Breast cancer Mother    Diabetes Mother    Hypertension Mother    Cancer Mother        breast   Hyperlipidemia Mother    Heart disease Maternal Grandmother    Breast cancer Maternal Grandmother    Birth defects Maternal Grandmother        breast   Cancer Father 42       leukemia   Allergies Sister     Arthritis Sister     Prior to Admission medications   Medication Sig Start Date End Date Taking? Authorizing Provider  acetaminophen (TYLENOL) 500 MG tablet Take 1,000 mg by mouth every 6 (six) hours as needed for mild pain.     [provider]  albuterol (VENTOLIN HFA) 108 (90 Base) MCG/ACT inhaler Inhale 2 puffs into the lungs every 6 (six) hours as needed for wheezing or shortness of breath. 12/14/21   Hunsucker, Lesia Sago, MD  aspirin 81 MG chewable tablet Chew 81 mg by mouth at bedtime.    [provider]  blood glucose meter kit and supplies KIT Dispense based on patient and insurance preference. Use up to four times daily as directed. (FOR ICD-9 250.00, 250.01). 05/20/19   Darlin Drop, DO  Cholecalciferol (VITAMIN D3) 25 MCG (1000 UT) capsule Take 1,000 Units by mouth daily.    [provider]  COD LIVER OIL PO Take 1 tablet by mouth daily.    [provider]  dapagliflozin propanediol (FARXIGA) 10 MG TABS tablet Take 10 mg by mouth daily before breakfast.    [provider]  famotidine (PEPCID) 40 MG tablet Take 1 tablet (40 mg total) by mouth at bedtime. Please schedule a yearly follow up for further refills. Thank you 01/06/23   Esterwood, Amy S, PA-C  fexofenadine (ALLEGRA) 180 MG tablet Take 180 mg by mouth daily. 11/17/22   [provider]  fluticasone (FLONASE) 50 MCG/ACT nasal spray Place 2 sprays into both nostrils daily. Patient taking differently: Place 2 sprays into both nostrils daily as needed for allergies or rhinitis. 07/02/22   Parrett, Virgel Bouquet, NP  folic acid (FOLVITE) 1 MG tablet Take 1 tablet (1 mg total) by mouth daily. 11/23/22   Marguerita Merles Latif, DO  furosemide (LASIX) 40 MG tablet Take 1 tablet (40 mg total) by mouth daily. 12/29/22   Noralee Stain, DO  GEMTESA 75 MG TABS Take 75 mg by mouth daily.    [provider]  iron polysaccharides (NIFEREX) 150 MG capsule Take 1 capsule (150 mg total) by mouth  daily. 11/23/22   Marguerita Merles Latif, DO  losartan (COZAAR) 25 MG tablet Take 12.5 mg by mouth See admin instructions. Take 12.5 mg by mouth only when taking Furosemide 05/04/22   [provider]  Omega-3 Fatty Acids (FISH OIL PO) Take 1 tablet by mouth daily.    [provider]  RABEprazole (ACIPHEX) 20 MG tablet TAKE 1 TABLET BY MOUTH TWICE A DAY (BEFORE BREAKFAST AND DINNER) Patient taking differently: Take 20 mg by mouth 2 (two) times daily. 11/29/22   Esterwood, Amy S, PA-C  rosuvastatin (CRESTOR) 40 MG tablet TAKE 1 TABLET BY MOUTH EVERY DAY Patient  taking differently: Take 40 mg by mouth at bedtime. 10/11/22   Dunn, Tacey Ruiz, PA-C  timolol (TIMOPTIC) 0.5 % ophthalmic solution Place 1 drop into both eyes in the morning and at bedtime. 07/16/21   [provider]  TRAVATAN Z 0.004 % SOLN ophthalmic solution Place 1 drop into both eyes at bedtime.  03/14/11   [provider]    Physical Exam: Vitals:   01/29/23 0040 01/29/23 0559 01/29/23 0600 01/29/23 0945  BP:  108/66 108/66 109/64  Pulse:  93 89 92  Resp:  16  18  Temp: 98.6 F (37 C) 98.8 F (37.1 C)  98.4 F (36.9 C)  TempSrc: Oral Oral  Oral  SpO2:  98% 98% 97%  Weight:      Height:       Physical Exam Vitals and nursing note reviewed.  Constitutional:      General: She is awake. She is not in acute distress.    Appearance: She is obese.  HENT:     Head: Normocephalic.     Nose: No rhinorrhea.     Mouth/Throat:     Mouth: Mucous membranes are moist.  Eyes:     General: No scleral icterus.    Pupils: Pupils are equal, round, and reactive to light.  Neck:     Vascular: No JVD.  Cardiovascular:     Rate and Rhythm: Normal rate and regular rhythm.     Heart sounds: S1 normal and S2 normal.     Comments: Bilateral stage II lymphedema.  See pictures below. Pulmonary:     Effort: Pulmonary effort is normal.     Breath sounds: Normal breath sounds.  Abdominal:     General: Bowel sounds  are normal.     Palpations: Abdomen is soft.  Musculoskeletal:     Cervical back: Neck supple.     Right lower leg: 1+ Edema present.     Left lower leg: 1+ Edema present.  Skin:    General: Skin is warm and dry.     Findings: Erythema present.     Comments: Positive edema, calor, erythema and TTP of RLE.  Please see picture below.  Neurological:     General: No focal deficit present.     Mental Status: She is alert and oriented to person, place, and time.  Psychiatric:        Mood and Affect: Mood normal.        Behavior: Behavior normal. Behavior is cooperative.     Data Reviewed:  Results are pending, will review when available.   IMPRESSIONS    1. Left ventricular ejection fraction, by estimation, is 70 to 75%. The  left ventricle has hyperdynamic function. The left ventricle has no  regional wall motion abnormalities. Left ventricular diastolic parameters  are indeterminate.   2. Right ventricular systolic function is mildly reduced. The right  ventricular size is mildly enlarged. There is normal pulmonary artery  systolic pressure.   3. Left atrial size was mildly dilated.   4. The mitral valve is normal in structure. Trivial mitral valve  regurgitation. No evidence of mitral stenosis.   5. The aortic valve is tricuspid. Aortic valve regurgitation is not  visualized. No aortic stenosis is present.   6. Aortic dilatation noted. There is borderline dilatation of the  ascending aorta, measuring 36 mm.   7. The inferior vena cava is normal in size with greater than 50%  respiratory variability, suggesting right atrial pressure of 3 mmHg.  Assessment and Plan: Principal Problem:   Cellulitis of right lower extremity Admit to MedSurg/inpatient. Gentle/time-limited IV fluids. Analgesics as needed. Continue ceftriaxone 2 g IVPB every 24 hours. Continue vancomycin per pharmacy. Follow-up blood culture and sensitivity Follow CBC and CMP in a.m.  Active Problems:    AKI (acute kidney injury) (HCC) Superimposed on:   CKD stage 3a, GFR 45-59 ml/min (HCC) Gentle IV fluids. Hold ARB/ACE. Hold diuretic. Avoid hypotension. Avoid nephrotoxins. Monitor intake and output. Monitor renal function electrolytes.    Hypokalemia Supplemented. Follow potassium level.    Normochromic normocytic anemia Monitor hematocrit and hemoglobin.   Transfuse as needed.    GERD (gastroesophageal reflux disease) Continue famotidine 20 mg p.o. daily at bedtime.    Chronic diastolic CHF (congestive heart failure) (HCC) Does not seem to be overloaded. Holding diuretic and ARB due to AKI.      Type 2 diabetes mellitus with hyperglycemia (HCC) Carbohydrate modified diet. CBG monitoring with RI SS.    Hyperlipidemia Continue rosuvastatin 40 mg p.o. bedtime.     Advance Care Planning:   Code Status: Full Code   Consults:   Family Communication:   Severity of Illness: The appropriate patient status for this patient is INPATIENT. Inpatient status is judged to be reasonable and necessary in order to provide the required intensity of service to ensure the patient's safety. The patient's presenting symptoms, physical exam findings, and initial radiographic and laboratory data in the context of their chronic comorbidities is felt to place them at high risk for further clinical deterioration. Furthermore, it is not anticipated that the patient will be medically stable for discharge from the hospital within 2 midnights of admission.   * I certify that at the point of admission it is my clinical judgment that the patient will require inpatient hospital care spanning beyond 2 midnights from the point of admission due to high intensity of service, high risk for further deterioration and high frequency of surveillance required.*  Author: Bobette Mo, MD 01/29/2023 10:38 AM  For on call review [email or web address].   This document was prepared using Dragon voice recognition  software and may contain some unintended transcription errors.

## 2023-01-29 NOTE — ED Notes (Signed)
Patient asked for 2 [place] sandwich, 2 ginger ale.

## 2023-01-29 NOTE — ED Provider Notes (Signed)
Patient was seen last night with cellulitis right lower leg and possible DVT.  She was going to be discharged home to get a ultrasound of her leg this morning but the patient never got a ride home.  She stayed in the emergency department all night long and then got her ultrasound today.  The ultrasound was negative for DVT but after examining the patient it appears the cellulitis has gotten significantly worse.  She will be admitted to medicine to treat the cellulitis with IV antibiotics   Bethann Berkshire, MD 01/29/23 1041

## 2023-01-29 NOTE — Progress Notes (Signed)
Lower extremity venous right study completed.  Preliminary results relayed to Nanavati, MD.  See CV Proc for preliminary results report.   Reginal Wojcicki, RDMS, RVT  

## 2023-01-29 NOTE — Plan of Care (Signed)
  Problem: Clinical Measurements: Goal: Ability to avoid or minimize complications of infection will improve Outcome: Progressing   Problem: Skin Integrity: Goal: Skin integrity will improve Outcome: Progressing   Problem: Education: Goal: Knowledge of General Education information will improve Description: Including pain rating scale, medication(s)/side effects and non-pharmacologic comfort measures Outcome: Progressing   Problem: Health Behavior/Discharge Planning: Goal: Ability to manage health-related needs will improve Outcome: Progressing   Problem: Clinical Measurements: Goal: Ability to maintain clinical measurements within normal limits will improve Outcome: Progressing Goal: Will remain free from infection Outcome: Progressing Goal: Diagnostic test results will improve Outcome: Progressing Goal: Respiratory complications will improve Outcome: Progressing Goal: Cardiovascular complication will be avoided Outcome: Progressing   Problem: Activity: Goal: Risk for activity intolerance will decrease Outcome: Progressing   Problem: Nutrition: Goal: Adequate nutrition will be maintained Outcome: Progressing   Problem: Coping: Goal: Level of anxiety will decrease Outcome: Progressing   Problem: Elimination: Goal: Will not experience complications related to bowel motility Outcome: Progressing Goal: Will not experience complications related to urinary retention Outcome: Progressing   Problem: Pain Managment: Goal: General experience of comfort will improve Outcome: Progressing   Problem: Safety: Goal: Ability to remain free from injury will improve Outcome: Progressing   Problem: Skin Integrity: Goal: Risk for impaired skin integrity will decrease Outcome: Progressing   Problem: Education: Goal: Ability to describe self-care measures that may prevent or decrease complications (Diabetes Survival Skills Education) will improve Outcome: Progressing Goal:  Individualized Educational Video(s) Outcome: Progressing   Problem: Coping: Goal: Ability to adjust to condition or change in health will improve Outcome: Progressing   Problem: Fluid Volume: Goal: Ability to maintain a balanced intake and output will improve Outcome: Progressing   Problem: Health Behavior/Discharge Planning: Goal: Ability to identify and utilize available resources and services will improve Outcome: Progressing Goal: Ability to manage health-related needs will improve Outcome: Progressing   Problem: Metabolic: Goal: Ability to maintain appropriate glucose levels will improve Outcome: Progressing   Problem: Nutritional: Goal: Maintenance of adequate nutrition will improve Outcome: Progressing Goal: Progress toward achieving an optimal weight will improve Outcome: Progressing   Problem: Skin Integrity: Goal: Risk for impaired skin integrity will decrease Outcome: Progressing   Problem: Tissue Perfusion: Goal: Adequacy of tissue perfusion will improve Outcome: Progressing   

## 2023-01-30 DIAGNOSIS — L03115 Cellulitis of right lower limb: Secondary | ICD-10-CM

## 2023-01-30 LAB — COMPREHENSIVE METABOLIC PANEL
ALT: 21 U/L (ref 0–44)
AST: 17 U/L (ref 15–41)
Albumin: 2.8 g/dL — ABNORMAL LOW (ref 3.5–5.0)
Alkaline Phosphatase: 81 U/L (ref 38–126)
Anion gap: 6 (ref 5–15)
BUN: 13 mg/dL (ref 8–23)
CO2: 22 mmol/L (ref 22–32)
Calcium: 9.4 mg/dL (ref 8.9–10.3)
Chloride: 107 mmol/L (ref 98–111)
Creatinine, Ser: 0.95 mg/dL (ref 0.44–1.00)
GFR, Estimated: >60 mL/min (ref >60)
Glucose, Bld: 115 mg/dL — ABNORMAL HIGH (ref 70–99)
Potassium: 3.9 mmol/L (ref 3.5–5.1)
Sodium: 135 mmol/L (ref 135–145)
Total Bilirubin: 0.5 mg/dL (ref 0.3–1.2)
Total Protein: 6.6 g/dL (ref 6.5–8.1)

## 2023-01-30 LAB — CBC
HCT: 29.9 % — ABNORMAL LOW (ref 36.0–46.0)
Hemoglobin: 9.5 g/dL — ABNORMAL LOW (ref 12.0–15.0)
MCH: 26.3 pg (ref 26.0–34.0)
MCHC: 31.8 g/dL (ref 30.0–36.0)
MCV: 82.8 fL (ref 80.0–100.0)
Platelets: 114 10*3/uL — ABNORMAL LOW (ref 150–400)
RBC: 3.61 MIL/uL — ABNORMAL LOW (ref 3.87–5.11)
RDW: 16.7 % — ABNORMAL HIGH (ref 11.5–15.5)
WBC: 13 10*3/uL — ABNORMAL HIGH (ref 4.0–10.5)
nRBC: 0 % (ref 0.0–0.2)

## 2023-01-30 LAB — GLUCOSE, CAPILLARY
Glucose-Capillary: 117 mg/dL — ABNORMAL HIGH (ref 70–99)
Glucose-Capillary: 179 mg/dL — ABNORMAL HIGH (ref 70–99)
Glucose-Capillary: 141 mg/dL — ABNORMAL HIGH (ref 70–99)
Glucose-Capillary: 209 mg/dL — ABNORMAL HIGH (ref 70–99)

## 2023-01-30 MED ORDER — PANTOPRAZOLE SODIUM 40 MG PO TBEC
40.0000 mg | DELAYED_RELEASE_TABLET | Freq: Two times a day (BID) | ORAL | Status: DC
  Administered 2023-01-30 – 2023-02-01 (×4): 40 mg via ORAL
  Filled 2023-01-30 (×4): qty 1

## 2023-01-30 MED ORDER — POTASSIUM CHLORIDE CRYS ER 20 MEQ PO TBCR
40.0000 meq | EXTENDED_RELEASE_TABLET | Freq: Once | ORAL | Status: AC
  Administered 2023-01-30: 40 meq via ORAL
  Filled 2023-01-30: qty 2

## 2023-01-30 MED ORDER — FUROSEMIDE 10 MG/ML IJ SOLN
20.0000 mg | Freq: Two times a day (BID) | INTRAMUSCULAR | Status: DC
  Administered 2023-01-30 (×2): 20 mg via INTRAVENOUS
  Filled 2023-01-30 (×2): qty 2

## 2023-01-30 NOTE — Progress Notes (Signed)
PROGRESS NOTE    Jessica Yates  ZOX:[id number] DOB: 06/16/1951 DOA: 01/28/2023 PCP: Renaye Rakers, MD   Brief Narrative: 72 year old with past medical history significant for allergic rhinitis, anemia, anxiety, depression, osteoarthritis, asthma, chronic diastolic heart failure, stage III CKD, diabetes type 2, hyperlipidemia, unspecified coagulation defect, dysphagia, GERD, lymphedema, obesity, sickle cell trait, vitamin D deficiency, DVT, presents complaining of right lower extremity edema erythema and tenderness status status 2 days prior to admission.  Patient found to have leukocytosis, bilateral lower extremity Dopplers negative for DVT.  Patient has been admitted for right  lower extremity cellulitis.   Assessment & Plan:   Principal Problem:   Cellulitis of right lower extremity Active Problems:   Normochromic normocytic anemia   GERD (gastroesophageal reflux disease)   Chronic diastolic CHF (congestive heart failure) (HCC)   AKI (acute kidney injury) (HCC)   CKD stage 3a, GFR 45-59 ml/min (HCC)   Hypokalemia   Type 2 diabetes mellitus with hyperglycemia (HCC)   Hyperlipidemia  1-Right Lower Extremity Cellulitis:  -Continue with IV vancomycin and ceftriaxone -Elevation of  right lower extremity -NSL fluids.  -Will give dose IV lasix.  -Blood cultures not sent, she has been already on IV antibiotics  2-AKI on CKD stage IIIa: She received IV fluids. Cr peak to 1.2 , baseline 0.8 Improved.  NSL  Monitor on IV lasix.   3-Hypokalemia: Replace orally  Normocytic anemia: Monitor hemoglobin GERD: Continue with famotidine Chronic diastolic heart failure: Resume diuretics Diabetes type 2 with hyperglycemia: Continue with sliding scale insulin Hyperlipidemia: Continue with rosuvastatin  Thrombocytopenia; suspect in setting of infection. Monitor on lovenox  Estimated body mass index is 33.67 kg/m as calculated from the following:   Height as of this encounter: 5\' 7"   (1.702 m).   Weight as of this encounter: 97.5 kg.   DVT prophylaxis: Lovenox Code Status: Full code Family Communication: Care discussed with patient.  Disposition Plan:  Status is: Inpatient Remains inpatient appropriate because: management of cellulitis.     Consultants:  None  Procedures:  none  Antimicrobials:    Subjective: She report still edema, redness and warm LE>    Objective: Vitals:   01/29/23 1230 01/29/23 1614 01/29/23 2014 01/30/23 0606  BP: 104/64 108/66 (!) 101/58 112/67  Pulse: 90 86 89 93  Resp: 19 17 16 16   Temp: 98.6 F (37 C) 99.3 F (37.4 C) 98.8 F (37.1 C) 99.5 F (37.5 C)  TempSrc: Oral Oral Oral Oral  SpO2: 97% 98% 98% 97%  Weight:      Height:        Intake/Output Summary (Last 24 hours) at 01/30/2023 1455 Last data filed at 01/30/2023 1200 Gross per 24 hour  Intake 2176.57 ml  Output 500 ml  Net 1676.57 ml   Filed Weights   01/28/23 2012  Weight: 97.5 kg    Examination:  General exam: Appears calm and comfortable  Respiratory system: Clear to auscultation. Respiratory effort normal. Cardiovascular system: S1 & S2 heard, RRR. Gastrointestinal system: Abdomen is nondistended, soft and nontender. No organomegaly or masses felt. Normal bowel sounds heard. Central nervous system: Alert and oriented.  Extremities: Symmetric 5 x 5 power.   Data Reviewed: I have personally reviewed following labs and imaging studies  CBC: Recent Labs  Lab 01/28/23 2148 01/29/23 1354 01/30/23 0328  WBC 13.1* 12.4* 13.0*  NEUTROABS  --  10.9*  --   HGB 11.8* 11.0* 9.5*  HCT 37.8 34.2* 29.9*  MCV 83.8 81.6 82.8  PLT  128* 88* 114*   Basic Metabolic Panel: Recent Labs  Lab 01/28/23 2148 01/29/23 1354 2023-02-08 0328  NA 137 135 135  K 3.4* 3.6 3.9  CL 106 106 107  CO2 19* 21* 22  GLUCOSE 229* 210* 115*  BUN 19 18 13   CREATININE 1.25* 1.14* 0.95  CALCIUM 9.8 9.9 9.4  MG  --  2.0  --   PHOS  --  1.6*  --    GFR: Estimated  Creatinine Clearance: 64.2 mL/min (by C-G formula based on SCr of 0.95 mg/dL). Liver Function Tests: Recent Labs  Lab 01/29/23 1354 02-08-2023 0328  AST 24 17  ALT 25 21  ALKPHOS 78 81  BILITOT 0.7 0.5  PROT 7.0 6.6  ALBUMIN 3.1* 2.8*   No results for input(s): "LIPASE", "AMYLASE" in the last 168 hours. No results for input(s): "AMMONIA" in the last 168 hours. Coagulation Profile: Recent Labs  Lab 01/28/23 2200  INR 1.2   Cardiac Enzymes: No results for input(s): "CKTOTAL", "CKMB", "CKMBINDEX", "TROPONINI" in the last 168 hours. BNP (last 3 results) No results for input(s): "PROBNP" in the last 8760 hours. HbA1C: No results for input(s): "HGBA1C" in the last 72 hours. CBG: Recent Labs  Lab 01/29/23 1247 01/29/23 1633 01/29/23 2327 02/08/2023 0733 Feb 08, 2023 1134  GLUCAP 261* 135* 117* 117* 179*   Lipid Profile: No results for input(s): "CHOL", "HDL", "LDLCALC", "TRIG", "CHOLHDL", "LDLDIRECT" in the last 72 hours. Thyroid Function Tests: No results for input(s): "TSH", "T4TOTAL", "FREET4", "T3FREE", "THYROIDAB" in the last 72 hours. Anemia Panel: No results for input(s): "VITAMINB12", "FOLATE", "FERRITIN", "TIBC", "IRON", "RETICCTPCT" in the last 72 hours. Sepsis Labs: No results for input(s): "PROCALCITON", "LATICACIDVEN" in the last 168 hours.  No results found for this or any previous visit (from the past 240 hour(s)).       Radiology Studies: LE Venous  Result Date: 2023-02-08  Lower Venous DVT Study Patient Name:  Jessica Yates [facility]  Date of Exam:   01/29/2023 Medical Rec #: [id number]          Accession #:    [id number] Date of Birth: 02-02-1951          Patient Gender: F Patient Age:   47 years Exam Location:  [facility] Procedure:      VAS [place] LOWER EXTREMITY VENOUS (DVT) Referring Phys: Janey Genta NANAVATI --------------------------------------------------------------------------------  Indications: Pain, swelling, redness of anterior aspect of calf x 2 days.   Risk Factors: History of lymphedema, history of cellulitis, reported history of LLE DVT in 2002 with +10 negative studies since this time. Comparison Study: 12-28-2022 Negative left lower extremity venous.                   11-18-2022 Negative right lower extremity venous.                   07-15-2022 Left LE reflux study negative for DVT.                   09-26-2021 Negative left lower extremity venous.                   07-24-2021 Bilateral LE reflux study negative for DVT.                   08-06-2019 Negative right lower extremity venous.                   07-30-2019 Negative left lower extremity venous.  05-12-2019 Negative bilateral lower extremity venous.                   07-23-2018 Negative left lower extremity venous. Performing Technologist: Jean Rosenthal RDMS, RVT  Examination Guidelines: A complete evaluation includes B-mode imaging, spectral Doppler, color Doppler, and power Doppler as needed of all accessible portions of each vessel. Bilateral testing is considered an integral part of a complete examination. Limited examinations for reoccurring indications may be performed as noted. The reflux portion of the exam is performed with the patient in reverse Trendelenburg.  +---------+---------------+---------+-----------+----------+-------------------+ RIGHT    CompressibilityPhasicitySpontaneityPropertiesThrombus Aging      +---------+---------------+---------+-----------+----------+-------------------+ CFV      Full           Yes      Yes                                      +---------+---------------+---------+-----------+----------+-------------------+ SFJ      Full                                                             +---------+---------------+---------+-----------+----------+-------------------+ FV Prox  Full                                                             +---------+---------------+---------+-----------+----------+-------------------+  FV Mid   Full                                                             +---------+---------------+---------+-----------+----------+-------------------+ FV DistalFull                                                             +---------+---------------+---------+-----------+----------+-------------------+ PFV      Full                                                             +---------+---------------+---------+-----------+----------+-------------------+ POP      Full           Yes      Yes                                      +---------+---------------+---------+-----------+----------+-------------------+ PTV      Full                                                             +---------+---------------+---------+-----------+----------+-------------------+  PERO     Full                                         Poor visualization                                                        secondary to                                                              overlying edema,                                                          patient pain, skin                                                        changes, and tissue                                                       properties.         +---------+---------------+---------+-----------+----------+-------------------+   +----+---------------+---------+-----------+----------+--------------+ LEFTCompressibilityPhasicitySpontaneityPropertiesThrombus Aging +----+---------------+---------+-----------+----------+--------------+ CFV Full           Yes      Yes                                 +----+---------------+---------+-----------+----------+--------------+     Summary: RIGHT: - There is no evidence of deep vein thrombosis in the lower extremity. However, portions of this examination were limited- see technologist comments above.  - No cystic structure found in the  popliteal fossa.  LEFT: - No evidence of common femoral vein obstruction.  *See table(s) above for measurements and observations. Electronically signed by Gerarda Fraction on 01/30/2023 at 8:33:35 AM.    Final         Scheduled Meds:  aspirin EC  81 mg Oral QHS   enoxaparin (LOVENOX) injection  40 mg Subcutaneous Q24H   famotidine  20 mg Oral QHS   furosemide  20 mg Intravenous BID   insulin aspart  0-20 Units Subcutaneous TID WC   latanoprost  1 drop Both Eyes QHS   rosuvastatin  40 mg Oral QHS   timolol  1 drop Both Eyes BID   Continuous Infusions:  cefTRIAXone (ROCEPHIN)  IV 2 g (01/30/23 1116)   vancomycin 1,250 mg (01/30/23 1331)     LOS: 1 day    Time spent: 35 minutes    Brittin Janik A  Alma Muegge, MD [facility]   If 7PM-7AM, please contact night-coverage [email or web address]  01/30/2023, 2:55 PM

## 2023-01-30 NOTE — Plan of Care (Signed)
  Problem: Skin Integrity: Goal: Skin integrity will improve Outcome: Progressing   Problem: Clinical Measurements: Goal: Ability to maintain clinical measurements within normal limits will improve Outcome: Progressing   Problem: Activity: Goal: Risk for activity intolerance will decrease Outcome: Progressing   Problem: Pain Managment: Goal: General experience of comfort will improve Outcome: Progressing   Problem: Safety: Goal: Ability to remain free from injury will improve Outcome: Progressing   Problem: Skin Integrity: Goal: Risk for impaired skin integrity will decrease Outcome: Progressing

## 2023-01-30 NOTE — Plan of Care (Signed)
  Problem: Clinical Measurements: Goal: Ability to avoid or minimize complications of infection will improve Outcome: Progressing   Problem: Skin Integrity: Goal: Skin integrity will improve Outcome: Progressing   Problem: Education: Goal: Knowledge of General Education information will improve Description: Including pain rating scale, medication(s)/side effects and non-pharmacologic comfort measures Outcome: Progressing   Problem: Health Behavior/Discharge Planning: Goal: Ability to manage health-related needs will improve Outcome: Progressing   Problem: Clinical Measurements: Goal: Ability to maintain clinical measurements within normal limits will improve Outcome: Progressing Goal: Will remain free from infection Outcome: Progressing Goal: Diagnostic test results will improve Outcome: Progressing Goal: Respiratory complications will improve Outcome: Progressing Goal: Cardiovascular complication will be avoided Outcome: Progressing   Problem: Activity: Goal: Risk for activity intolerance will decrease Outcome: Progressing   Problem: Nutrition: Goal: Adequate nutrition will be maintained Outcome: Progressing   Problem: Coping: Goal: Level of anxiety will decrease Outcome: Progressing   Problem: Elimination: Goal: Will not experience complications related to bowel motility Outcome: Progressing Goal: Will not experience complications related to urinary retention Outcome: Progressing   Problem: Pain Managment: Goal: General experience of comfort will improve Outcome: Progressing   Problem: Safety: Goal: Ability to remain free from injury will improve Outcome: Progressing   Problem: Skin Integrity: Goal: Risk for impaired skin integrity will decrease Outcome: Progressing   Problem: Education: Goal: Ability to describe self-care measures that may prevent or decrease complications (Diabetes Survival Skills Education) will improve Outcome: Progressing Goal:  Individualized Educational Video(s) Outcome: Progressing   Problem: Coping: Goal: Ability to adjust to condition or change in health will improve Outcome: Progressing   Problem: Fluid Volume: Goal: Ability to maintain a balanced intake and output will improve Outcome: Progressing   Problem: Health Behavior/Discharge Planning: Goal: Ability to identify and utilize available resources and services will improve Outcome: Progressing Goal: Ability to manage health-related needs will improve Outcome: Progressing   Problem: Metabolic: Goal: Ability to maintain appropriate glucose levels will improve Outcome: Progressing   Problem: Nutritional: Goal: Maintenance of adequate nutrition will improve Outcome: Progressing Goal: Progress toward achieving an optimal weight will improve Outcome: Progressing   Problem: Skin Integrity: Goal: Risk for impaired skin integrity will decrease Outcome: Progressing   Problem: Tissue Perfusion: Goal: Adequacy of tissue perfusion will improve Outcome: Progressing   

## 2023-01-31 DIAGNOSIS — L03115 Cellulitis of right lower limb: Secondary | ICD-10-CM | POA: Diagnosis not present

## 2023-01-31 LAB — GLUCOSE, CAPILLARY
Glucose-Capillary: 134 mg/dL — ABNORMAL HIGH (ref 70–99)
Glucose-Capillary: 158 mg/dL — ABNORMAL HIGH (ref 70–99)
Glucose-Capillary: 218 mg/dL — ABNORMAL HIGH (ref 70–99)
Glucose-Capillary: 154 mg/dL — ABNORMAL HIGH (ref 70–99)

## 2023-01-31 LAB — BASIC METABOLIC PANEL
Anion gap: 8 (ref 5–15)
BUN: 11 mg/dL (ref 8–23)
CO2: 23 mmol/L (ref 22–32)
Calcium: 9.2 mg/dL (ref 8.9–10.3)
Chloride: 105 mmol/L (ref 98–111)
Creatinine, Ser: 0.94 mg/dL (ref 0.44–1.00)
GFR, Estimated: >60 mL/min (ref >60)
Glucose, Bld: 150 mg/dL — ABNORMAL HIGH (ref 70–99)
Potassium: 3.5 mmol/L (ref 3.5–5.1)
Sodium: 136 mmol/L (ref 135–145)

## 2023-01-31 LAB — CBC
HCT: 31.1 % — ABNORMAL LOW (ref 36.0–46.0)
Hemoglobin: 10.1 g/dL — ABNORMAL LOW (ref 12.0–15.0)
MCH: 26.2 pg (ref 26.0–34.0)
MCHC: 32.5 g/dL (ref 30.0–36.0)
MCV: 80.8 fL (ref 80.0–100.0)
Platelets: 151 10*3/uL (ref 150–400)
RBC: 3.85 MIL/uL — ABNORMAL LOW (ref 3.87–5.11)
RDW: 16.6 % — ABNORMAL HIGH (ref 11.5–15.5)
WBC: 10.8 10*3/uL — ABNORMAL HIGH (ref 4.0–10.5)
nRBC: 0 % (ref 0.0–0.2)

## 2023-01-31 MED ORDER — POTASSIUM CHLORIDE CRYS ER 20 MEQ PO TBCR
40.0000 meq | EXTENDED_RELEASE_TABLET | Freq: Once | ORAL | Status: AC
  Administered 2023-01-31: 40 meq via ORAL
  Filled 2023-01-31: qty 2

## 2023-01-31 MED ORDER — VITAMIN B-12 1000 MCG PO TABS
500.0000 ug | ORAL_TABLET | Freq: Every day | ORAL | Status: DC
  Administered 2023-01-31 – 2023-02-01 (×2): 500 ug via ORAL
  Filled 2023-01-31 (×2): qty 1

## 2023-01-31 MED ORDER — FOLIC ACID 1 MG PO TABS
1.0000 mg | ORAL_TABLET | Freq: Every day | ORAL | Status: DC
  Administered 2023-01-31 – 2023-02-01 (×2): 1 mg via ORAL
  Filled 2023-01-31 (×2): qty 1

## 2023-01-31 MED ORDER — VANCOMYCIN HCL IN DEXTROSE 1-5 GM/200ML-% IV SOLN
1000.0000 mg | INTRAVENOUS | Status: DC
  Administered 2023-01-31: 1000 mg via INTRAVENOUS
  Filled 2023-01-31: qty 200

## 2023-01-31 MED ORDER — FUROSEMIDE 10 MG/ML IJ SOLN
20.0000 mg | Freq: Once | INTRAMUSCULAR | Status: AC
  Administered 2023-01-31: 20 mg via INTRAVENOUS
  Filled 2023-01-31: qty 2

## 2023-01-31 MED ORDER — FERROUS SULFATE 325 (65 FE) MG PO TABS
325.0000 mg | ORAL_TABLET | Freq: Every day | ORAL | Status: DC
  Administered 2023-01-31 – 2023-02-01 (×2): 325 mg via ORAL
  Filled 2023-01-31 (×2): qty 1

## 2023-01-31 NOTE — Evaluation (Addendum)
Physical Therapy Evaluation Patient Details Name: Jessica Yates MRN: [id number] DOB: [birth year] Today's Date: 01/31/2023  History of Present Illness  72 year old with past medical history significant for allergic rhinitis, anemia, anxiety, depression, osteoarthritis, asthma, chronic diastolic heart failure, stage III CKD, diabetes type 2, hyperlipidemia, unspecified coagulation defect, dysphagia, GERD, lymphedema, obesity, sickle cell trait, vitamin D deficiency, DVT, presents complaining of right lower extremity edema erythema and tenderness status status 2 days prior to admission.  Patient found to have leukocytosis, bilateral lower extremity Dopplers negative for DVT.  Patient has been admitted for right  lower extremity cellulitis.  Clinical Impression  The patient is very pleasant  modified independent PTA, uses cane or rollator, sister assists with transportation/groceries. Lives with brother.  Patient ambulated x 60' using SPC. Patient should progress to return home. Pt admitted with above diagnosis.  Pt currently with functional limitations due to the deficits listed below (see PT Problem List). Pt will benefit from acute skilled PT to increase their independence and safety with mobility to allow discharge.    Patient complains of left elbow, small heat pad placed over elbow.        Recommendations for follow up therapy are one component of a multi-disciplinary discharge planning process, led by the attending physician.  Recommendations may be updated based on patient status, additional functional criteria and insurance authorization.  Follow Up Recommendations       Assistance Recommended at Discharge PRN  Patient can return home with the following  A little help with bathing/dressing/bathroom;Assistance with cooking/housework;Help with stairs or ramp for entrance;Assist for transportation    Equipment Recommendations None recommended by PT  Recommendations for Other Services        Functional Status Assessment Patient has had a recent decline in their functional status and demonstrates the ability to make significant improvements in function in a reasonable and predictable amount of time.     Precautions / Restrictions Precautions Precautions: Fall Restrictions Weight Bearing Restrictions: No      Mobility  Bed Mobility Overal bed mobility: Modified Independent                  Transfers Overall transfer level: Needs assistance Equipment used: Straight cane Transfers: Sit to/from Stand Sit to Stand: Supervision                Ambulation/Gait Ambulation/Gait assistance: Min guard Gait Distance (Feet): 60 Feet Assistive device: Straight cane Gait Pattern/deviations: Step-to pattern, Step-through pattern       General Gait Details: patient stops and turns to look into rooms, no loss of balance  Stairs            Wheelchair Mobility    Modified Rankin (Stroke Patients Only)       Balance Overall balance assessment: Mild deficits observed, not formally tested                                           Pertinent Vitals/Pain Pain Assessment Pain Assessment: 0-10 Pain Score: 8  Pain Location: right leg. Left elbow Pain Descriptors / Indicators: Aching, Discomfort Pain Intervention(s): Heat applied (to left elbow)    Home Living Family/patient expects to be discharged to:: Private residence Living Arrangements: Other relatives Available Help at Discharge: Family Type of Home: House Home Access: Stairs to enter Entrance Stairs-Rails: None Entrance Stairs-Number of Steps: 1   Home Layout: One level  Home Equipment: Cane - single point;Rollator (4 wheels);Shower seat Additional Comments: lives with brother who cannot assist    Prior Function Prior Level of Function : Independent/Modified Independent               ADLs Comments: mod I (cane). sister transports and gets  groceries     Hand  Dominance   Dominant Hand: Right    Extremity/Trunk Assessment   Upper Extremity Assessment Upper Extremity Assessment: LUE deficits/detail LUE Deficits / Details: has some difficulty elevating  left arm over head, reports elbow is painful    Lower Extremity Assessment Lower Extremity Assessment: RLE deficits/detail;LLE deficits/detail RLE Deficits / Details: skin changes WFL for gait LLE Deficits / Details: same as right       Communication   Communication: No difficulties  Cognition Arousal/Alertness: Awake/alert Behavior During Therapy: WFL for tasks assessed/performed Overall Cognitive Status: Within Functional Limits for tasks assessed                                          General Comments      Exercises     Assessment/Plan    PT Assessment Patient needs continued PT services  PT Problem List Decreased mobility;Decreased knowledge of use of DME;Decreased activity tolerance       PT Treatment Interventions DME instruction;Therapeutic activities;Gait training;Functional mobility training;Therapeutic exercise;Patient/family education    PT Goals (Current goals can be found in the Care Plan section)  Acute Rehab PT Goals Patient Stated Goal: go home PT Goal Formulation: With patient Time For Goal Achievement: 02/14/23 Potential to Achieve Goals: Good    Frequency Min 1X/week     Co-evaluation               AM-PAC PT "6 Clicks" Mobility  Outcome Measure Help needed turning from your back to your side while in a flat bed without using bedrails?: None Help needed moving from lying on your back to sitting on the side of a flat bed without using bedrails?: None Help needed moving to and from a bed to a chair (including a wheelchair)?: A Little Help needed standing up from a chair using your arms (e.g., wheelchair or bedside chair)?: A Little Help needed to walk in hospital room?: A Little Help needed climbing 3-5 steps with a railing?  : A Lot 6 Click Score: 19    End of Session Equipment Utilized During Treatment: Gait belt Activity Tolerance: Patient tolerated treatment well Patient left: in bed;with bed alarm set;with call bell/phone within reach Nurse Communication: Mobility status PT Visit Diagnosis: Unsteadiness on feet (R26.81);Difficulty in walking, not elsewhere classified (R26.2);Pain Pain - Right/Left: Left Pain - part of body: Arm    Time: 4098-1191 PT Time Calculation (min) (ACUTE ONLY): 52 min   Charges:   PT Evaluation $PT Eval Low Complexity: 1 Low PT Treatments $Gait Training: 8-22 mins $Self Care/Home Management: 8-22        Blanchard Kelch PT Acute Rehabilitation Services Office [phone] Weekend pager-[phone]   Rada Hay 01/31/2023, 4:21 PM

## 2023-01-31 NOTE — Progress Notes (Signed)
PROGRESS NOTE    Jessica Yates  VWU:[id number] DOB: [birth year] DOA: 01/28/2023 PCP: Renaye Rakers, MD   Brief Narrative: 72 year old with past medical history significant for allergic rhinitis, anemia, anxiety, depression, osteoarthritis, asthma, chronic diastolic heart failure, stage III CKD, diabetes type 2, hyperlipidemia, unspecified coagulation defect, dysphagia, GERD, lymphedema, obesity, sickle cell trait, vitamin D deficiency, DVT, presents complaining of right lower extremity edema erythema and tenderness status status 2 days prior to admission.  Patient found to have leukocytosis, bilateral lower extremity Dopplers negative for DVT.  Patient has been admitted for right  lower extremity cellulitis.   Assessment & Plan:   Principal Problem:   Cellulitis of right lower extremity Active Problems:   Normochromic normocytic anemia   GERD (gastroesophageal reflux disease)   Chronic diastolic CHF (congestive heart failure) (HCC)   AKI (acute kidney injury) (HCC)   CKD stage 3a, GFR 45-59 ml/min (HCC)   Hypokalemia   Type 2 diabetes mellitus with hyperglycemia (HCC)   Hyperlipidemia  1-Right Lower Extremity Cellulitis:  -Continue with IV vancomycin and ceftriaxone -Elevation of  right lower extremity -NSL fluids.  -Continue with  dose IV lasix. Edema decreasing.  -Blood cultures not sent, she has been already on IV antibiotics Negative 1 L.   2-AKI on CKD stage IIIa: She received IV fluids. Cr peak to 1.2 , baseline 0.8 Improved.  NSL  Monitor on IV lasix.  Stable on lasix.   3-Hypokalemia: Replaced orally  Normocytic anemia: Monitor hemoglobin GERD: Continue with famotidine Chronic diastolic heart failure: Resume diuretics Diabetes type 2 with hyperglycemia: Continue with sliding scale insulin. Hyperlipidemia: Continue with rosuvastatin.  Thrombocytopenia; suspect in setting of infection. Monitor on lovenox Anemia; iron deficiency; folic acid deficiency. Start  folic acid, ferrous sulfate, B 12 supplement.    Estimated body mass index is 33.67 kg/m as calculated from the following:   Height as of this encounter: 5\' 7"  (1.702 m).   Weight as of this encounter: 97.5 kg.   DVT prophylaxis: Lovenox Code Status: Full code Family Communication: Care discussed with patient.  Disposition Plan:  Status is: Inpatient Remains inpatient appropriate because: management of cellulitis.     Consultants:  None  Procedures:  none  Antimicrobials:    Subjective: Edema decreasing, less redness.  She is feeling better.    Objective: Vitals:   01/29/23 2014 01/30/23 0606 01/30/23 2107 01/31/23 0500  BP: (!) 101/58 112/67 (!) 107/58 107/67  Pulse: 89 93 91 78  Resp: 16 16 18 17   Temp: 98.8 F (37.1 C) 99.5 F (37.5 C) 99.9 F (37.7 C) 98.4 F (36.9 C)  TempSrc: Oral Oral Oral Oral  SpO2: 98% 97% 99% 98%  Weight:      Height:        Intake/Output Summary (Last 24 hours) at 01/31/2023 1006 Last data filed at 01/31/2023 0455 Gross per 24 hour  Intake 350 ml  Output 4050 ml  Net -3700 ml    Filed Weights   01/28/23 2012  Weight: 97.5 kg    Examination:  General exam: NAD Respiratory system: CTA Cardiovascular system: S 1, S 2 RRR Gastrointestinal system: BS present, soft, nt Central nervous system: Alert, oriented.  Extremities: trace edema, less redness.    Data Reviewed: I have personally reviewed following labs and imaging studies  CBC: Recent Labs  Lab 01/28/23 2148 01/29/23 1354 01/30/23 0328 01/31/23 0611  WBC 13.1* 12.4* 13.0* 10.8*  NEUTROABS  --  10.9*  --   --   HGB  11.8* 11.0* 9.5* 10.1*  HCT 37.8 34.2* 29.9* 31.1*  MCV 83.8 81.6 82.8 80.8  PLT 128* 88* 114* 151    Basic Metabolic Panel: Recent Labs  Lab 01/28/23 2148 01/29/23 1354 01/30/23 0328 01/31/23 0611  NA 137 135 135 136  K 3.4* 3.6 3.9 3.5  CL 106 106 107 105  CO2 19* 21* 22 23  GLUCOSE 229* 210* 115* 150*  BUN 19 18 13 11    CREATININE 1.25* 1.14* 0.95 0.94  CALCIUM 9.8 9.9 9.4 9.2  MG  --  2.0  --   --   PHOS  --  1.6*  --   --     GFR: Estimated Creatinine Clearance: 64.9 mL/min (by C-G formula based on SCr of 0.94 mg/dL). Liver Function Tests: Recent Labs  Lab 01/29/23 1354 01/30/23 0328  AST 24 17  ALT 25 21  ALKPHOS 78 81  BILITOT 0.7 0.5  PROT 7.0 6.6  ALBUMIN 3.1* 2.8*    No results for input(s): "LIPASE", "AMYLASE" in the last 168 hours. No results for input(s): "AMMONIA" in the last 168 hours. Coagulation Profile: Recent Labs  Lab 01/28/23 2200  INR 1.2    Cardiac Enzymes: No results for input(s): "CKTOTAL", "CKMB", "CKMBINDEX", "TROPONINI" in the last 168 hours. BNP (last 3 results) No results for input(s): "PROBNP" in the last 8760 hours. HbA1C: No results for input(s): "HGBA1C" in the last 72 hours. CBG: Recent Labs  Lab 01/30/23 0733 01/30/23 1134 01/30/23 1525 01/30/23 2120 01/31/23 0849  GLUCAP 117* 179* 141* 209* 134*    Lipid Profile: No results for input(s): "CHOL", "HDL", "LDLCALC", "TRIG", "CHOLHDL", "LDLDIRECT" in the last 72 hours. Thyroid Function Tests: No results for input(s): "TSH", "T4TOTAL", "FREET4", "T3FREE", "THYROIDAB" in the last 72 hours. Anemia Panel: No results for input(s): "VITAMINB12", "FOLATE", "FERRITIN", "TIBC", "IRON", "RETICCTPCT" in the last 72 hours. Sepsis Labs: No results for input(s): "PROCALCITON", "LATICACIDVEN" in the last 168 hours.  No results found for this or any previous visit (from the past 240 hour(s)).       Radiology Studies: No results found.      Scheduled Meds:  aspirin EC  81 mg Oral QHS   vitamin B-12  500 mcg Oral Daily   enoxaparin (LOVENOX) injection  40 mg Subcutaneous Q24H   famotidine  20 mg Oral QHS   ferrous sulfate  325 mg Oral Q breakfast   folic acid  1 mg Oral Daily   furosemide  20 mg Intravenous Once   insulin aspart  0-20 Units Subcutaneous TID WC   latanoprost  1 drop Both Eyes  QHS   pantoprazole  40 mg Oral BID   potassium chloride  40 mEq Oral Once   rosuvastatin  40 mg Oral QHS   timolol  1 drop Both Eyes BID   Continuous Infusions:  cefTRIAXone (ROCEPHIN)  IV Stopped (01/30/23 1146)   vancomycin       LOS: 2 days    Time spent: 35 minutes    Mickell Birdwell A Terrel Manalo, MD [facility]   If 7PM-7AM, please contact night-coverage [email or web address]  01/31/2023, 10:06 AM

## 2023-01-31 NOTE — Plan of Care (Signed)
  Problem: Clinical Measurements: Goal: Ability to avoid or minimize complications of infection will improve Outcome: Progressing   Problem: Skin Integrity: Goal: Skin integrity will improve Outcome: Progressing   Problem: Clinical Measurements: Goal: Ability to maintain clinical measurements within normal limits will improve Outcome: Progressing   

## 2023-02-01 DIAGNOSIS — L03115 Cellulitis of right lower limb: Secondary | ICD-10-CM | POA: Diagnosis not present

## 2023-02-01 LAB — CBC
HCT: 31.9 % — ABNORMAL LOW (ref 36.0–46.0)
Hemoglobin: 10.4 g/dL — ABNORMAL LOW (ref 12.0–15.0)
MCH: 26.1 pg (ref 26.0–34.0)
MCHC: 32.6 g/dL (ref 30.0–36.0)
MCV: 80.2 fL (ref 80.0–100.0)
Platelets: 209 10*3/uL (ref 150–400)
RBC: 3.98 MIL/uL (ref 3.87–5.11)
RDW: 16.5 % — ABNORMAL HIGH (ref 11.5–15.5)
WBC: 9.4 10*3/uL (ref 4.0–10.5)
nRBC: 0 % (ref 0.0–0.2)

## 2023-02-01 LAB — BASIC METABOLIC PANEL
Anion gap: 9 (ref 5–15)
BUN: 14 mg/dL (ref 8–23)
CO2: 24 mmol/L (ref 22–32)
Calcium: 9.3 mg/dL (ref 8.9–10.3)
Chloride: 102 mmol/L (ref 98–111)
Creatinine, Ser: 1.04 mg/dL — ABNORMAL HIGH (ref 0.44–1.00)
GFR, Estimated: 57 mL/min — ABNORMAL LOW (ref >60)
Glucose, Bld: 170 mg/dL — ABNORMAL HIGH (ref 70–99)
Potassium: 3.7 mmol/L (ref 3.5–5.1)
Sodium: 135 mmol/L (ref 135–145)

## 2023-02-01 LAB — GLUCOSE, CAPILLARY
Glucose-Capillary: 166 mg/dL — ABNORMAL HIGH (ref 70–99)
Glucose-Capillary: 250 mg/dL — ABNORMAL HIGH (ref 70–99)

## 2023-02-01 MED ORDER — FOLIC ACID 1 MG PO TABS: 1.0000 mg | ORAL_TABLET | Freq: Every day | ORAL | 0 refills | Status: AC

## 2023-02-01 MED ORDER — DOXYCYCLINE HYCLATE 50 MG PO CAPS: 100.0000 mg | ORAL_CAPSULE | Freq: Two times a day (BID) | ORAL | 0 refills | Status: AC

## 2023-02-01 MED ORDER — AMOXICILLIN-POT CLAVULANATE 875-125 MG PO TABS: 1.0000 | ORAL_TABLET | Freq: Two times a day (BID) | ORAL | Status: DC

## 2023-02-01 MED ORDER — AMOXICILLIN-POT CLAVULANATE 875-125 MG PO TABS: 1.0000 | ORAL_TABLET | Freq: Two times a day (BID) | ORAL | 0 refills | Status: AC

## 2023-02-01 MED ORDER — CYANOCOBALAMIN 500 MCG PO TABS: 500.0000 ug | ORAL_TABLET | Freq: Every day | ORAL | 0 refills | Status: AC

## 2023-02-01 MED ORDER — DICLOFENAC SODIUM 1 % EX GEL
2.0000 g | Freq: Two times a day (BID) | CUTANEOUS | Status: DC
  Filled 2023-02-01: qty 100

## 2023-02-01 MED ORDER — POLYSACCHARIDE IRON COMPLEX 150 MG PO CAPS: 150.0000 mg | ORAL_CAPSULE | Freq: Every day | ORAL | 0 refills | Status: DC

## 2023-02-01 NOTE — Discharge Summary (Signed)
Physician Discharge Summary   Patient: Jessica Yates MRN: [id number] DOB: 1950-10-30  Admit date:     01/28/2023  Discharge date: 02/01/23  Discharge Physician: Alba Cory   PCP: Renaye Rakers, MD   Recommendations at discharge:    Follow up resolution of Cellulitis.  Needs repeat iron, folic acid level.  Needs B-met to follow renal function.   Discharge Diagnoses: Principal Problem:   Cellulitis of right lower extremity Active Problems:   Normochromic normocytic anemia   GERD (gastroesophageal reflux disease)   Chronic diastolic CHF (congestive heart failure) (HCC)   AKI (acute kidney injury) (HCC)   CKD stage 3a, GFR 45-59 ml/min (HCC)   Hypokalemia   Type 2 diabetes mellitus with hyperglycemia (HCC)   Hyperlipidemia  Resolved Problems:   * No resolved hospital problems. *  Hospital Course: 72 year old with past medical history significant for allergic rhinitis, anemia, anxiety, depression, osteoarthritis, asthma, chronic diastolic heart failure, stage III CKD, diabetes type 2, hyperlipidemia, unspecified coagulation defect, dysphagia, GERD, lymphedema, obesity, sickle cell trait, vitamin D deficiency, DVT, presents complaining of right lower extremity edema erythema and tenderness status status 2 days prior to admission. Patient found to have leukocytosis, bilateral lower extremity Dopplers negative for DVT. Patient has been admitted for right lower extremity cellulitis.   Assessment and Plan: 1-Right Lower Extremity Cellulitis:  -Treated with IV vancomycin and ceftriaxone 3 days. Discharge on Augmentin and Doxy for 3 days.  -Elevation of  right lower extremity -NSL fluids.  -Continue with  dose IV lasix. Edema decreasing.  -Blood cultures not sent, she has been already on IV antibiotics Negative 1 L.    improved.   2CKD stage IIIa: She received IV fluids. Cr peak to 1.2 , baseline 0.8--1.2 Stable. AKI ruled out , prior cr 1.2 NSL  Monitor on IV lasix.   Stable on lasix.    3-Hypokalemia: Replaced orally   Normocytic anemia: Monitor hemoglobin GERD: Continue with famotidine Chronic diastolic heart failure: Resume diuretics Diabetes type 2 with hyperglycemia: Continue with sliding scale insulin. Hyperlipidemia: Continue with rosuvastatin.   Thrombocytopenia; suspect in setting of infection. Monitor on lovenox. Resolved.  Anemia; iron deficiency; folic acid deficiency. Started  folic acid, ferrous sulfate, B 12 supplement.           Consultants: None Procedures performed: None  Disposition: Home Diet recommendation:  Discharge Diet Orders (From admission, onward)     Start     Ordered   02/01/23 0000  Diet - low sodium heart healthy        02/01/23 1240           Cardiac diet DISCHARGE MEDICATION: Allergies as of 02/01/2023   No Known Allergies      Medication List     TAKE these medications    acetaminophen 500 MG tablet Commonly known as: TYLENOL Take 1,000 mg by mouth every 6 (six) hours as needed for mild pain.   albuterol 108 (90 Base) MCG/ACT inhaler Commonly known as: VENTOLIN HFA Inhale 2 puffs into the lungs every 6 (six) hours as needed for wheezing or shortness of breath.   amoxicillin-clavulanate 875-125 MG tablet Commonly known as: AUGMENTIN Take 1 tablet by mouth every 12 (twelve) hours for 3 days.   aspirin 81 MG chewable tablet Chew 81 mg by mouth at bedtime.   blood glucose meter kit and supplies Kit Dispense based on patient and insurance preference. Use up to four times daily as directed. (FOR ICD-9 250.00, 250.01).   COD  LIVER OIL PO Take 1 tablet by mouth daily.   cyanocobalamin 500 MCG tablet Commonly known as: VITAMIN B12 Take 1 tablet (500 mcg total) by mouth daily. Start taking on: Feb 02, 2023   doxycycline 50 MG capsule Commonly known as: VIBRAMYCIN Take 2 capsules (100 mg total) by mouth 2 (two) times daily for 3 days.   famotidine 40 MG tablet Commonly known as:  PEPCID Take 1 tablet (40 mg total) by mouth at bedtime. Please schedule a yearly follow up for further refills. Thank you   Farxiga 10 MG Tabs tablet Generic drug: dapagliflozin propanediol Take 10 mg by mouth daily before breakfast.   fexofenadine 180 MG tablet Commonly known as: ALLEGRA Take 180 mg by mouth daily.   FISH OIL PO Take 1 tablet by mouth daily.   fluticasone 50 MCG/ACT nasal spray Commonly known as: FLONASE Place 2 sprays into both nostrils daily. What changed:  when to take this reasons to take this   folic acid 1 MG tablet Commonly known as: FOLVITE Take 1 tablet (1 mg total) by mouth daily.   furosemide 40 MG tablet Commonly known as: LASIX Take 1 tablet (40 mg total) by mouth daily.   Gemtesa 75 MG Tabs Generic drug: Vibegron Take 75 mg by mouth daily.   iron polysaccharides 150 MG capsule Commonly known as: NIFEREX Take 1 capsule (150 mg total) by mouth daily.   losartan 25 MG tablet Commonly known as: COZAAR Take 12.5 mg by mouth See admin instructions. Take 12.5 mg by mouth only when taking Furosemide   RABEprazole 20 MG tablet Commonly known as: ACIPHEX TAKE 1 TABLET BY MOUTH TWICE A DAY (BEFORE BREAKFAST AND DINNER) What changed: See the new instructions.   rosuvastatin 40 MG tablet Commonly known as: CRESTOR TAKE 1 TABLET BY MOUTH EVERY DAY What changed: when to take this   timolol 0.5 % ophthalmic solution Commonly known as: TIMOPTIC Place 1 drop into both eyes in the morning and at bedtime.   Travatan Z 0.004 % Soln ophthalmic solution Generic drug: Travoprost (BAK Free) Place 1 drop into both eyes at bedtime.   Vitamin D3 25 MCG (1000 UT) Caps Take 1,000 Units by mouth daily.        Follow-up Information     Renaye Rakers, MD In 2 weeks.   Specialty: Family Medicine Contact information: [address] [place] [address] [phone]                Discharge Exam: Ceasar Mons Weights   01/28/23 2012   Weight: 97.5 kg   General; NAD  Condition at discharge: stable  The results of significant diagnostics from this hospitalization (including imaging, microbiology, ancillary and laboratory) are listed below for reference.   Imaging Studies: LE Venous  Result Date: Feb 11, 2023  Lower Venous DVT Study Patient Name:  CASHLYNN WEISBERG [facility]  Date of Exam:   01/29/2023 Medical Rec #: [id number]          Accession #:    [id number] Date of Birth: December 03, 1950          Patient Gender: F Patient Age:   72 years Exam [facility]:  [facility] Procedure:      VAS [place] LOWER EXTREMITY VENOUS (DVT) Referring Phys: Janey Genta NANAVATI --------------------------------------------------------------------------------  Indications: Pain, swelling, redness of anterior aspect of calf x 2 days.  Risk Factors: History of lymphedema, history of cellulitis, reported history of LLE DVT in 2002 with +10 negative studies since this time.  Comparison Study: 12-28-2022 Negative left lower extremity venous.                   11-18-2022 Negative right lower extremity venous.                   07-15-2022 Left LE reflux study negative for DVT.                   09-26-2021 Negative left lower extremity venous.                   07-24-2021 Bilateral LE reflux study negative for DVT.                   08-06-2019 Negative right lower extremity venous.                   07-30-2019 Negative left lower extremity venous.                   05-12-2019 Negative bilateral lower extremity venous.                   07-23-2018 Negative left lower extremity venous. Performing Technologist: Jean Rosenthal RDMS, RVT  Examination Guidelines: A complete evaluation includes B-mode imaging, spectral Doppler, color Doppler, and power Doppler as needed of all accessible portions of each vessel. Bilateral testing is considered an integral part of a complete examination. Limited examinations for reoccurring indications may be performed as noted. The reflux portion of the exam  is performed with the patient in reverse Trendelenburg.  +---------+---------------+---------+-----------+----------+-------------------+ RIGHT    CompressibilityPhasicitySpontaneityPropertiesThrombus Aging      +---------+---------------+---------+-----------+----------+-------------------+ CFV      Full           Yes      Yes                                      +---------+---------------+---------+-----------+----------+-------------------+ SFJ      Full                                                             +---------+---------------+---------+-----------+----------+-------------------+ FV Prox  Full                                                             +---------+---------------+---------+-----------+----------+-------------------+ FV Mid   Full                                                             +---------+---------------+---------+-----------+----------+-------------------+ FV DistalFull                                                             +---------+---------------+---------+-----------+----------+-------------------+  PFV      Full                                                             +---------+---------------+---------+-----------+----------+-------------------+ POP      Full           Yes      Yes                                      +---------+---------------+---------+-----------+----------+-------------------+ PTV      Full                                                             +---------+---------------+---------+-----------+----------+-------------------+ PERO     Full                                         Poor visualization                                                        secondary to                                                              overlying edema,                                                          patient pain, skin                                                         changes, and tissue                                                       properties.         +---------+---------------+---------+-----------+----------+-------------------+   +----+---------------+---------+-----------+----------+--------------+ LEFTCompressibilityPhasicitySpontaneityPropertiesThrombus Aging +----+---------------+---------+-----------+----------+--------------+ CFV Full           Yes      Yes                                 +----+---------------+---------+-----------+----------+--------------+  Summary: RIGHT: - There is no evidence of deep vein thrombosis in the lower extremity. However, portions of this examination were limited- see technologist comments above.  - No cystic structure found in the popliteal fossa.  LEFT: - No evidence of common femoral vein obstruction.  *See table(s) above for measurements and observations. Electronically signed by Gerarda Fraction on 01/30/2023 at 8:33:35 AM.    Final     Microbiology: Results for orders placed or performed during the hospital encounter of 12/27/22  Culture, blood (Routine X 2) w Reflex to ID Panel     Status: None   Collection Time: 12/28/22  2:35 AM   Specimen: BLOOD  Result Value Ref Range Status   Specimen Description   Final    BLOOD BLOOD RIGHT HAND Performed at [facility], [address], [place], [place] [address]    Special Requests   Final    BOTTLES DRAWN AEROBIC AND ANAEROBIC Blood Culture adequate volume Performed at [facility], [address] [place], [place] [address]    Culture   Final    NO GROWTH 5 DAYS Performed at [facility], [address], [place], [place] [address]    Report Status 01/02/2023 FINAL  Final  Culture, blood (Routine X 2) w Reflex to ID Panel     Status: None   Collection Time: 12/28/22  2:37 AM   Specimen: BLOOD  Result Value Ref Range Status   Specimen Description   Final    BLOOD  SITE NOT SPECIFIED Performed at [facility], [address] [place], [place] [address]    Special Requests   Final    BOTTLES DRAWN AEROBIC AND ANAEROBIC Blood Culture results may not be optimal due to an inadequate volume of blood received in culture bottles Performed at [facility], [address], [place], [place] [address]    Culture   Final    NO GROWTH 5 DAYS Performed at [facility], [address] [place], [place] [address]    Report Status 01/02/2023 FINAL  Final  MRSA Next Gen by PCR, Nasal     Status: None   Collection Time: 12/28/22  6:22 AM   Specimen: Nasal Mucosa; Nasal Swab  Result Value Ref Range Status   MRSA by PCR Next Gen NOT DETECTED NOT DETECTED Final    Comment: (NOTE) The GeneXpert MRSA Assay (FDA approved for NASAL specimens only), is one component of a comprehensive MRSA colonization surveillance program. It is not intended to diagnose MRSA infection nor to guide or monitor treatment for MRSA infections. Test performance is not FDA approved in patients less than 33 years old. Performed at [facility], [address], [place], [place] [address]     Labs: CBC: Recent Labs  Lab 01/28/23 2148 01/29/23 1354 01/30/23 0328 01/31/23 0611 02/01/23 0415  WBC 13.1* 12.4* 13.0* 10.8* 9.4  NEUTROABS  --  10.9*  --   --   --   HGB 11.8* 11.0* 9.5* 10.1* 10.4*  HCT 37.8 34.2* 29.9* 31.1* 31.9*  MCV 83.8 81.6 82.8 80.8 80.2  PLT 128* 88* 114* 151 209   Basic Metabolic Panel: Recent Labs  Lab 01/28/23 2148 01/29/23 1354 01/30/23 0328 01/31/23 0611 02/01/23 0415  NA 137 135 135 136 135  K 3.4* 3.6 3.9 3.5 3.7  CL 106 106 107 105 102  CO2 19* 21* 22 23 24   GLUCOSE 229* 210* 115* 150* 170*  BUN 19 18 13 11 14   CREATININE 1.25* 1.14* 0.95 0.94 1.04*  CALCIUM 9.8 9.9 9.4 9.2 9.3  MG  --  2.0  --   --   --   PHOS  --  1.6*  --   --   --    Liver Function Tests: Recent Labs  Lab  01/29/23 1354 01/30/23 0328  AST 24 17  ALT 25 21  ALKPHOS 78 81  BILITOT 0.7 0.5  PROT 7.0 6.6  ALBUMIN 3.1* 2.8*   CBG: Recent Labs  Lab 01/31/23 1346 01/31/23 1855 01/31/23 2125 02/01/23 0731 02/01/23 1134  GLUCAP 158* 218* 154* 166* 250*    Discharge time spent: greater than 30 minutes.  Signed: Alba Cory, MD [facility] 02/01/2023

## 2023-02-01 NOTE — Plan of Care (Signed)
  Problem: Clinical Measurements: Goal: Ability to avoid or minimize complications of infection will improve Outcome: Progressing   Problem: Clinical Measurements: Goal: Ability to maintain clinical measurements within normal limits will improve Outcome: Progressing   Problem: Coping: Goal: Level of anxiety will decrease Outcome: Progressing   Problem: Pain Managment: Goal: General experience of comfort will improve Outcome: Progressing   Problem: Safety: Goal: Ability to remain free from injury will improve Outcome: Progressing

## 2023-02-01 NOTE — TOC Progression Note (Signed)
Transition of Care [facility]) - Progression Note    Patient Details  Name: Jessica Yates MRN: [id number] Date of Birth: [birth year]  Transition of Care [facility]) CM/SW Contact  Coralyn Helling, [place] Phone Number: 02/01/2023, 9:37 AM  Clinical Narrative:     Transition of Care [facility]) Screening Note   Patient Details  Name: Jessica Yates Date of Birth: [birth year]   Transition of Care [facility]) CM/SW Contact:    Coralyn Helling, LCSW Phone Number: 02/01/2023, 9:37 AM    Transition of Care Department [facility]) has reviewed patient and no TOC needs have been identified at this time. We will continue to monitor patient advancement through interdisciplinary progression rounds. If new patient transition needs arise, please place a TOC consult.          Expected Discharge Plan and Services                                               Social Determinants of Health (SDOH) Interventions SDOH Screenings   Food Insecurity: No Food Insecurity (01/29/2023)  Housing: Low Risk  (01/29/2023)  Transportation Needs: No Transportation Needs (01/29/2023)  Utilities: Not At Risk (01/29/2023)  Tobacco Use: Low Risk  (01/29/2023)    Readmission Risk Interventions     No data to display

## 2023-02-01 NOTE — Progress Notes (Signed)
Mobility Specialist - Progress Note   02/01/23 1053  Mobility  Activity Ambulated with assistance in hallway  Level of Assistance Contact guard assist, steadying assist  Assistive Device Front wheel walker  Distance Ambulated (ft) 30 ft  Range of Motion/Exercises Active  Activity Response Tolerated well  Mobility Referral Yes  $Mobility charge 1 Mobility  Mobility Specialist Start Time (ACUTE ONLY) 1045  Mobility Specialist Stop Time (ACUTE ONLY) 1053  Mobility Specialist Time Calculation (min) (ACUTE ONLY) 8 min   Pt received in bed and agreed to mobility. Pt felt that her L leg was stiff and R arm was sore.  Pt used walker due to weakness in leg, pt returned to chair early with all needs met. RN in room.  Marilynne Halsted Mobility Specialist

## 2023-02-04 ENCOUNTER — Other Ambulatory Visit: Payer: Self-pay | Admitting: Physician Assistant

## 2023-02-06 ENCOUNTER — Other Ambulatory Visit: Payer: Self-pay | Admitting: Physician Assistant

## 2023-02-10 DIAGNOSIS — E1169 Type 2 diabetes mellitus with other specified complication: Secondary | ICD-10-CM | POA: Diagnosis not present

## 2023-02-10 DIAGNOSIS — I10 Essential (primary) hypertension: Secondary | ICD-10-CM | POA: Diagnosis not present

## 2023-02-10 DIAGNOSIS — B3731 Acute candidiasis of vulva and vagina: Secondary | ICD-10-CM | POA: Diagnosis not present

## 2023-02-10 DIAGNOSIS — E782 Mixed hyperlipidemia: Secondary | ICD-10-CM | POA: Diagnosis not present

## 2023-02-10 DIAGNOSIS — L03116 Cellulitis of left lower limb: Secondary | ICD-10-CM | POA: Diagnosis not present

## 2023-02-10 DIAGNOSIS — I890 Lymphedema, not elsewhere classified: Secondary | ICD-10-CM | POA: Diagnosis not present

## 2023-02-10 DIAGNOSIS — E669 Obesity, unspecified: Secondary | ICD-10-CM | POA: Diagnosis not present

## 2023-02-12 ENCOUNTER — Other Ambulatory Visit: Payer: Self-pay | Admitting: Pulmonary Disease

## 2023-02-14 DIAGNOSIS — H2513 Age-related nuclear cataract, bilateral: Secondary | ICD-10-CM | POA: Diagnosis not present

## 2023-02-14 DIAGNOSIS — H04123 Dry eye syndrome of bilateral lacrimal glands: Secondary | ICD-10-CM | POA: Diagnosis not present

## 2023-02-14 DIAGNOSIS — H401131 Primary open-angle glaucoma, bilateral, mild stage: Secondary | ICD-10-CM | POA: Diagnosis not present

## 2023-02-14 DIAGNOSIS — E119 Type 2 diabetes mellitus without complications: Secondary | ICD-10-CM | POA: Diagnosis not present

## 2023-02-14 DIAGNOSIS — H524 Presbyopia: Secondary | ICD-10-CM | POA: Diagnosis not present

## 2023-02-14 DIAGNOSIS — H5213 Myopia, bilateral: Secondary | ICD-10-CM | POA: Diagnosis not present

## 2023-02-18 DIAGNOSIS — E1169 Type 2 diabetes mellitus with other specified complication: Secondary | ICD-10-CM | POA: Diagnosis not present

## 2023-02-25 DIAGNOSIS — E1169 Type 2 diabetes mellitus with other specified complication: Secondary | ICD-10-CM | POA: Diagnosis not present

## 2023-03-03 DIAGNOSIS — I890 Lymphedema, not elsewhere classified: Secondary | ICD-10-CM | POA: Diagnosis not present

## 2023-03-03 DIAGNOSIS — E669 Obesity, unspecified: Secondary | ICD-10-CM | POA: Diagnosis not present

## 2023-03-03 DIAGNOSIS — R4182 Altered mental status, unspecified: Secondary | ICD-10-CM | POA: Diagnosis not present

## 2023-03-03 DIAGNOSIS — N3281 Overactive bladder: Secondary | ICD-10-CM | POA: Diagnosis not present

## 2023-03-03 DIAGNOSIS — I10 Essential (primary) hypertension: Secondary | ICD-10-CM | POA: Diagnosis not present

## 2023-03-03 DIAGNOSIS — E782 Mixed hyperlipidemia: Secondary | ICD-10-CM | POA: Diagnosis not present

## 2023-03-03 DIAGNOSIS — E1169 Type 2 diabetes mellitus with other specified complication: Secondary | ICD-10-CM | POA: Diagnosis not present

## 2023-03-08 ENCOUNTER — Encounter: Payer: Self-pay | Admitting: Podiatry

## 2023-03-08 ENCOUNTER — Ambulatory Visit (INDEPENDENT_AMBULATORY_CARE_PROVIDER_SITE_OTHER): Payer: No Typology Code available for payment source | Admitting: Podiatry

## 2023-03-08 VITALS — BP 129/73 | HR 107

## 2023-03-08 DIAGNOSIS — Z794 Long term (current) use of insulin: Secondary | ICD-10-CM | POA: Diagnosis not present

## 2023-03-08 DIAGNOSIS — M79675 Pain in left toe(s): Secondary | ICD-10-CM

## 2023-03-08 DIAGNOSIS — E0822 Diabetes mellitus due to underlying condition with diabetic chronic kidney disease: Secondary | ICD-10-CM

## 2023-03-08 DIAGNOSIS — N183 Chronic kidney disease, stage 3 unspecified: Secondary | ICD-10-CM

## 2023-03-08 DIAGNOSIS — M79674 Pain in right toe(s): Secondary | ICD-10-CM

## 2023-03-08 DIAGNOSIS — B351 Tinea unguium: Secondary | ICD-10-CM

## 2023-03-08 NOTE — Progress Notes (Signed)
  Subjective:  Patient ID: Jessica Yates, female    DOB: [birth year],  MRN: [id number]  Jessica Yates presents to clinic today for at risk foot care. Pt has h/o NIDDM with chronic kidney disease and painful thick toenails that are difficult to trim. Pain interferes with ambulation. Aggravating factors include wearing enclosed shoe gear. Pain is relieved with periodic professional debridement.  Patient has been hospitalized several times for DVT and/or LE cellulitis. She relates she wears her compression hose and takes her diuretics as instructed. Chief Complaint  Patient presents with   Diabetes    "Trim my nails."   Dr. Jackquline Denmark - 03/03/2023, glucose - 99 today,    New problem(s): None.   PCP is Renaye Rakers, MD.  No Known Allergies  Review of Systems: Negative except as noted in the HPI.  Objective: No changes noted in today's physical examination. Vitals:   03/08/23 1636  BP: 129/73  Pulse: (!) 107   Jessica Yates is a pleasant 72 y.o. female obese in NAD. AAO x 3.  Vascular Examination: CFT <3 seconds b/l. DP/PT pulses faintly palpable b/l. Skin temperature gradient warm to warm b/l. No pain with calf compression. No ischemia or gangrene. No cyanosis or clubbing noted b/l. Lymphedema present BLE.   Neurological Examination: Sensation grossly intact b/l with 10 gram monofilament. Vibratory sensation intact b/l. Pt has subjective symptoms of neuropathy.   Dermatological Examination: Pedal skin warm and supple b/l.   No open wounds. No interdigital macerations.  Toenails 1-5 b/l thick, discolored, elongated with subungual debris and pain on dorsal palpation.    Skin changes consistent with venous stasis noted b/l lower extremities. Hyperpigmentation consistent with findings of chronic venous insufficiency is present b/l lower extremities.  Musculoskeletal Examination: Muscle strength 5/5 to b/l LE. HAV with bunion deformity noted b/l LE. Pes planus deformity  noted bilateral LE.  Radiographs: None  Assessment/Plan: 1. Pain due to onychomycosis of toenails of both feet   2. Diabetes mellitus due to underlying condition with stage 3 chronic kidney disease, with long-term current use of insulin, unspecified whether stage 3a or 3b CKD (HCC)   -Consent given for treatment as described below: -Examined patient. -Continue foot and shoe inspections daily. Monitor blood glucose per PCP/Endocrinologist's recommendations. -Toenails 1-5 b/l were debrided in length and girth with sterile nail nippers and dremel without iatrogenic bleeding.  -Patient/POA to call should there be question/concern in the interim.   Return in about 3 months (around 06/08/2023).  [name], DPM

## 2023-03-11 DIAGNOSIS — Z9641 Presence of insulin pump (external) (internal): Secondary | ICD-10-CM | POA: Diagnosis not present

## 2023-03-11 DIAGNOSIS — E1169 Type 2 diabetes mellitus with other specified complication: Secondary | ICD-10-CM | POA: Diagnosis not present

## 2023-03-11 DIAGNOSIS — Z7985 Long-term (current) use of injectable non-insulin antidiabetic drugs: Secondary | ICD-10-CM | POA: Diagnosis not present

## 2023-04-05 DIAGNOSIS — E782 Mixed hyperlipidemia: Secondary | ICD-10-CM | POA: Diagnosis not present

## 2023-04-05 DIAGNOSIS — E1169 Type 2 diabetes mellitus with other specified complication: Secondary | ICD-10-CM | POA: Diagnosis not present

## 2023-04-05 DIAGNOSIS — I10 Essential (primary) hypertension: Secondary | ICD-10-CM | POA: Diagnosis not present

## 2023-04-07 DIAGNOSIS — I890 Lymphedema, not elsewhere classified: Secondary | ICD-10-CM | POA: Diagnosis not present

## 2023-04-07 DIAGNOSIS — E669 Obesity, unspecified: Secondary | ICD-10-CM | POA: Diagnosis not present

## 2023-04-07 DIAGNOSIS — I10 Essential (primary) hypertension: Secondary | ICD-10-CM | POA: Diagnosis not present

## 2023-04-07 DIAGNOSIS — E1169 Type 2 diabetes mellitus with other specified complication: Secondary | ICD-10-CM | POA: Diagnosis not present

## 2023-04-09 ENCOUNTER — Other Ambulatory Visit: Payer: Self-pay | Admitting: Physician Assistant

## 2023-04-09 ENCOUNTER — Other Ambulatory Visit: Payer: Self-pay | Admitting: Internal Medicine

## 2023-04-12 ENCOUNTER — Other Ambulatory Visit: Payer: Self-pay

## 2023-04-12 MED ORDER — FUROSEMIDE 40 MG PO TABS: 40.0000 mg | ORAL_TABLET | Freq: Every day | ORAL | 0 refills | Status: DC

## 2023-04-18 DIAGNOSIS — E1169 Type 2 diabetes mellitus with other specified complication: Secondary | ICD-10-CM | POA: Diagnosis not present

## 2023-04-18 DIAGNOSIS — Z7985 Long-term (current) use of injectable non-insulin antidiabetic drugs: Secondary | ICD-10-CM | POA: Diagnosis not present

## 2023-04-25 ENCOUNTER — Other Ambulatory Visit: Payer: Self-pay

## 2023-04-25 ENCOUNTER — Encounter (HOSPITAL_COMMUNITY): Payer: Self-pay

## 2023-04-25 ENCOUNTER — Emergency Department (HOSPITAL_COMMUNITY)
Admission: EM | Admit: 2023-04-25 | Discharge: 2023-04-25 | Disposition: A | Discharge status: Home / Self Care | Payer: Medicare HMO | Source: Home / Self Care | Attending: Emergency Medicine | Admitting: Emergency Medicine

## 2023-04-25 ENCOUNTER — Emergency Department (HOSPITAL_BASED_OUTPATIENT_CLINIC_OR_DEPARTMENT_OTHER): Payer: Medicare HMO

## 2023-04-25 DIAGNOSIS — I509 Heart failure, unspecified: Secondary | ICD-10-CM | POA: Diagnosis not present

## 2023-04-25 DIAGNOSIS — D72829 Elevated white blood cell count, unspecified: Secondary | ICD-10-CM | POA: Insufficient documentation

## 2023-04-25 DIAGNOSIS — L03115 Cellulitis of right lower limb: Secondary | ICD-10-CM | POA: Diagnosis not present

## 2023-04-25 DIAGNOSIS — R000 Tachycardia, unspecified: Secondary | ICD-10-CM | POA: Insufficient documentation

## 2023-04-25 DIAGNOSIS — E119 Type 2 diabetes mellitus without complications: Secondary | ICD-10-CM | POA: Insufficient documentation

## 2023-04-25 DIAGNOSIS — Z7982 Long term (current) use of aspirin: Secondary | ICD-10-CM | POA: Diagnosis not present

## 2023-04-25 DIAGNOSIS — M79604 Pain in right leg: Secondary | ICD-10-CM | POA: Diagnosis present

## 2023-04-25 DIAGNOSIS — M79661 Pain in right lower leg: Secondary | ICD-10-CM | POA: Diagnosis not present

## 2023-04-25 DIAGNOSIS — D573 Sickle-cell trait: Secondary | ICD-10-CM | POA: Diagnosis not present

## 2023-04-25 LAB — CBC WITH DIFFERENTIAL/PLATELET
Abs Immature Granulocytes: 0.06 10*3/uL (ref 0.00–0.07)
Basophils Absolute: 0 10*3/uL (ref 0.0–0.1)
Basophils Relative: 0 %
Eosinophils Absolute: 0 10*3/uL (ref 0.0–0.5)
Eosinophils Relative: 0 %
HCT: 39.1 % (ref 36.0–46.0)
Hemoglobin: 12.7 g/dL (ref 12.0–15.0)
Immature Granulocytes: 0 %
Lymphocytes Relative: 4 %
Lymphs Abs: 0.5 10*3/uL — ABNORMAL LOW (ref 0.7–4.0)
MCH: 26.2 pg (ref 26.0–34.0)
MCHC: 32.5 g/dL (ref 30.0–36.0)
MCV: 80.6 fL (ref 80.0–100.0)
Monocytes Absolute: 0.5 10*3/uL (ref 0.1–1.0)
Monocytes Relative: 3 %
Neutro Abs: 12.3 10*3/uL — ABNORMAL HIGH (ref 1.7–7.7)
Neutrophils Relative %: 93 %
Platelets: 183 10*3/uL (ref 150–400)
RBC: 4.85 MIL/uL (ref 3.87–5.11)
RDW: 17.8 % — ABNORMAL HIGH (ref 11.5–15.5)
WBC: 13.4 10*3/uL — ABNORMAL HIGH (ref 4.0–10.5)
nRBC: 0 % (ref 0.0–0.2)

## 2023-04-25 LAB — COMPREHENSIVE METABOLIC PANEL
ALT: 15 U/L (ref 0–44)
AST: 20 U/L (ref 15–41)
Albumin: 3.9 g/dL (ref 3.5–5.0)
Alkaline Phosphatase: 78 U/L (ref 38–126)
Anion gap: 12 (ref 5–15)
BUN: 19 mg/dL (ref 8–23)
CO2: 20 mmol/L — ABNORMAL LOW (ref 22–32)
Calcium: 9.8 mg/dL (ref 8.9–10.3)
Chloride: 104 mmol/L (ref 98–111)
Creatinine, Ser: 1.34 mg/dL — ABNORMAL HIGH (ref 0.44–1.00)
GFR, Estimated: 42 mL/min — ABNORMAL LOW (ref >60)
Glucose, Bld: 164 mg/dL — ABNORMAL HIGH (ref 70–99)
Potassium: 3.5 mmol/L (ref 3.5–5.1)
Sodium: 136 mmol/L (ref 135–145)
Total Bilirubin: 0.8 mg/dL (ref 0.3–1.2)
Total Protein: 7.5 g/dL (ref 6.5–8.1)

## 2023-04-25 MED ORDER — CLINDAMYCIN HCL 150 MG PO CAPS: 150.0000 mg | ORAL_CAPSULE | Freq: Once | ORAL | Status: DC

## 2023-04-25 MED ORDER — CLINDAMYCIN HCL 150 MG PO CAPS: 150.0000 mg | ORAL_CAPSULE | Freq: Four times a day (QID) | ORAL | 0 refills | Status: DC

## 2023-04-25 MED ORDER — CLINDAMYCIN HCL 150 MG PO CAPS
150.0000 mg | ORAL_CAPSULE | Freq: Once | ORAL | Status: AC
  Administered 2023-04-25: 150 mg via ORAL
  Filled 2023-04-25: qty 1

## 2023-04-25 MED ORDER — CLINDAMYCIN HCL 300 MG PO CAPS: 450.0000 mg | ORAL_CAPSULE | Freq: Once | ORAL | Status: DC

## 2023-04-25 NOTE — ED Triage Notes (Signed)
Pt presents via POV c/o RLE pain and swelling. Reports hx of cellulitis in her legs. Reports fever at home and took Tylenol.

## 2023-04-25 NOTE — ED Notes (Signed)
Pt ambulatory to waiting room. Pt verbalized understanding of discharge instructions.   

## 2023-04-25 NOTE — Progress Notes (Signed)
Right lower extremity venous duplex has been completed. Preliminary results can be found in CV Proc through chart review.  Results were given to Dr. Rhunette Croft.  04/25/23 9:28 AM Olen Cordial RVT

## 2023-04-25 NOTE — Discharge Instructions (Addendum)
Please follow-up with your primary care provider regarding recent symptoms and ER visit.  Today your labs and imaging show that you have cellulitis causing your leg pain and were given 1 dose of antibiotic here in the ER and can pick up the rest of the prescription at your pharmacy.  This medication does carry a higher risk of C. difficile and so if you begin to have abdominal pain or diarrhea or changes in bowel habits please return to ER to be evaluated.  Is very important they follow-up with your primary care provider to ensure that your cellulitis is healing properly.  If your cellulitis begins to spread quickly outside of the black lines redrawn you today please come back to the ER to be evaluated.

## 2023-04-25 NOTE — ED Notes (Signed)
RLE marked with skin marker.

## 2023-04-25 NOTE — ED Provider Notes (Addendum)
[name] Provider Note   CSN: [id number] Arrival date & time: 04/25/23  1191     History  Chief Complaint  Patient presents with   Leg Pain    Jessica Yates is a 72 y.o. female history of diabetes, DVT not currently anticoagulated, CHF, lymphedema presented for right lower extremity pain and swelling for the past few days.  Patient notes that her legs are normally swollen due to her lymphedema however notes that her right leg does appear swollen, red, warm.  Patient notes that she has had cellulitis in this leg previously and thinks that was going on.  Patient does endorse a fever of 101 F at home and took Tylenol which helped resolve symptoms.  Patient states she was recently hospitalized and may for cellulitis for her left lower leg.  Patient states that she is able to walk without issue and denies any changes in gait.  Patient denies chest pain, shortness of breath, change in sensation/motor skills, nauseous vomiting, abdominal pain, new onset weakness  Home Medications Prior to Admission medications   Medication Sig Start Date End Date Taking? Authorizing Provider  clindamycin (CLEOCIN) 150 MG capsule Take 1 capsule (150 mg total) by mouth every 6 (six) hours. 04/25/23  Yes , Beverly Gust, PA-C  acetaminophen (TYLENOL) 500 MG tablet Take 1,000 mg by mouth every 6 (six) hours as needed for mild pain.     [provider]  albuterol (VENTOLIN HFA) 108 (90 Base) MCG/ACT inhaler TAKE 2 PUFFS BY MOUTH EVERY 6 HOURS AS NEEDED FOR WHEEZE OR SHORTNESS OF BREATH 02/14/23   Kalman Shan, MD  aspirin 81 MG chewable tablet Chew 81 mg by mouth at bedtime.    [provider]  blood glucose meter kit and supplies KIT Dispense based on patient and insurance preference. Use up to four times daily as directed. (FOR ICD-9 250.00, 250.01). 05/20/19   Darlin Drop, DO  Cholecalciferol (VITAMIN D3) 25 MCG (1000 UT) capsule Take 1,000  Units by mouth daily.    [provider]  COD LIVER OIL PO Take 1 tablet by mouth daily.    [provider]  cyanocobalamin (VITAMIN B12) 500 MCG tablet Take 1 tablet (500 mcg total) by mouth daily. 02/02/23   Regalado, Belkys A, MD  dapagliflozin propanediol (FARXIGA) 10 MG TABS tablet Take 10 mg by mouth daily before breakfast.    [provider]  famotidine (PEPCID) 40 MG tablet Take 1 tablet (40 mg total) by mouth at bedtime. Patient needs follow up appointment for future refills. Please call [phone] to schedule an appointment. 04/11/23   Esterwood, Amy S, PA-C  fexofenadine (ALLEGRA) 180 MG tablet Take 180 mg by mouth daily. 11/17/22   [provider]  fluticasone (FLONASE) 50 MCG/ACT nasal spray Place 2 sprays into both nostrils daily. Patient taking differently: Place 2 sprays into both nostrils daily as needed for allergies or rhinitis. 07/02/22   Parrett, Virgel Bouquet, NP  folic acid (FOLVITE) 1 MG tablet Take 1 tablet (1 mg total) by mouth daily. 02/01/23   Regalado, Belkys A, MD  furosemide (LASIX) 40 MG tablet Take 1 tablet (40 mg total) by mouth daily. PATIENT MUST SCHEDULE ANNUAL APPOINTMENT FOR FUTURE REFILLS 04/12/23   [place], Peter M, MD  GEMTESA 75 MG TABS Take 75 mg by mouth daily.    [provider]  iron polysaccharides (NIFEREX) 150 MG capsule Take 1 capsule (150 mg total) by mouth daily. 02/01/23  Regalado, Belkys A, MD  losartan (COZAAR) 25 MG tablet Take 12.5 mg by mouth See admin instructions. Take 12.5 mg by mouth only when taking Furosemide 05/04/22   [provider]  Omega-3 Fatty Acids (FISH OIL PO) Take 1 tablet by mouth daily.    [provider]  RABEprazole (ACIPHEX) 20 MG tablet Take 1 tablet (20 mg total) by mouth 2 (two) times daily. Patient needs follow up appointment for future refills. Please call [phone] to schedule an appointment. 04/11/23   Esterwood, Amy S, PA-C  rosuvastatin (CRESTOR) 40 MG tablet  TAKE 1 TABLET BY MOUTH EVERY DAY Patient taking differently: Take 40 mg by mouth at bedtime. 10/11/22   Dunn, Tacey Ruiz, PA-C  timolol (TIMOPTIC) 0.5 % ophthalmic solution Place 1 drop into both eyes in the morning and at bedtime. 07/16/21   [provider]  TRAVATAN Z 0.004 % SOLN ophthalmic solution Place 1 drop into both eyes at bedtime.  03/14/11   [provider]      Allergies    Patient has no known allergies.    Review of Systems   Review of Systems  Physical Exam Updated Vital Signs BP 117/67   Pulse 97   Temp 98.4 F (36.9 C)   Resp 15   SpO2 99%  Physical Exam Constitutional:      General: She is not in acute distress.    Appearance: She is not ill-appearing.  Cardiovascular:     Rate and Rhythm: Tachycardia present.     Pulses: Normal pulses.  Musculoskeletal:     Comments: Right lower extremity: Does appear more edematous than left, warm to the touch, erythematous Soft compartments No bony abnormalities or tenderness palpated 5 out of 5 right-sided knee extension, plantarflexion/dorsiflexion  Skin:    General: Skin is warm and dry.     Capillary Refill: Capillary refill takes less than 2 seconds.  Neurological:     Mental Status: She is alert.     Comments: Sensation intact distally  Psychiatric:        Mood and Affect: Mood normal.     ED Results / Procedures / Treatments   Labs (all labs ordered are listed, but only abnormal results are displayed) Labs Reviewed  COMPREHENSIVE METABOLIC PANEL - Abnormal; Notable for the following components:      Result Value   CO2 20 (*)    Glucose, Bld 164 (*)    Creatinine, Ser 1.34 (*)    GFR, Estimated 42 (*)    All other components within normal limits  CBC WITH DIFFERENTIAL/PLATELET - Abnormal; Notable for the following components:   WBC 13.4 (*)    RDW 17.8 (*)    Neutro Abs 12.3 (*)    Lymphs Abs 0.5 (*)    All other components within normal limits    EKG None  Radiology VAS [place] LOWER  EXTREMITY VENOUS (DVT) (7a-7p)  Result Date: 04/25/2023  Lower Venous DVT Study Patient Name:  Jessica Yates [name]  Date of Exam:   04/25/2023 Medical Rec #: [id number]          Accession #:    [id number] Date of Birth: 26-Dec-1950          Patient Gender: F Patient Age:   86 years Exam Location:  [facility] Procedure:      VAS [place] LOWER EXTREMITY VENOUS (DVT) Referring Phys: Evlyn Kanner --------------------------------------------------------------------------------  Indications: Pain.  Risk Factors: None identified. Limitations: Body habitus and poor ultrasound/tissue interface. Comparison  Study: No prior studies. Performing Technologist: Chanda Busing RVT  Examination Guidelines: A complete evaluation includes B-mode imaging, spectral Doppler, color Doppler, and power Doppler as needed of all accessible portions of each vessel. Bilateral testing is considered an integral part of a complete examination. Limited examinations for reoccurring indications may be performed as noted. The reflux portion of the exam is performed with the patient in reverse Trendelenburg.  +---------+---------------+---------+-----------+----------+-------------------+ RIGHT    CompressibilityPhasicitySpontaneityPropertiesThrombus Aging      +---------+---------------+---------+-----------+----------+-------------------+ CFV      Full           Yes      Yes                                      +---------+---------------+---------+-----------+----------+-------------------+ SFJ      Full                                                             +---------+---------------+---------+-----------+----------+-------------------+ FV Prox  Full                                                             +---------+---------------+---------+-----------+----------+-------------------+ FV Mid   Full                                                              +---------+---------------+---------+-----------+----------+-------------------+ FV DistalFull                                                             +---------+---------------+---------+-----------+----------+-------------------+ PFV      Full                                                             +---------+---------------+---------+-----------+----------+-------------------+ POP      Full           Yes      Yes                                      +---------+---------------+---------+-----------+----------+-------------------+ PTV      Full                                                             +---------+---------------+---------+-----------+----------+-------------------+  PERO                                                  Not well visualized +---------+---------------+---------+-----------+----------+-------------------+   +----+---------------+---------+-----------+----------+--------------+ LEFTCompressibilityPhasicitySpontaneityPropertiesThrombus Aging +----+---------------+---------+-----------+----------+--------------+ CFV Full           Yes      Yes                                 +----+---------------+---------+-----------+----------+--------------+    Summary: RIGHT: - There is no evidence of deep vein thrombosis in the lower extremity. However, portions of this examination were limited- see technologist comments above.  - No cystic structure found in the popliteal fossa.  LEFT: - No evidence of common femoral vein obstruction.  *See table(s) above for measurements and observations.    Preliminary     Procedures Procedures    Medications Ordered in ED Medications  clindamycin (CLEOCIN) capsule 150 mg (has no administration in time range)    ED Course/ Medical Decision Making/ A&P                                 Medical Decision Making Amount and/or Complexity of Data Reviewed Labs: ordered.  Risk Prescription drug  management.   [name] 72 y.o. presented today for right leg pain. Working DDx that I considered at this time includes, but not limited to, cellulitis, DVT, neurovascular compromise, venous stasis, medication induced, compartment syndrome, fracture.  R/o DDx: DVT, neurovascular compromise, venous stasis, medication induced, compartment syndrome, fracture: These are considered less likely due to history of present illness and physical exam findings  Review of prior external notes: 01/31/2023 discharge summary  Unique Tests and My Interpretation:  CBC: Leukocytosis 13.4 CMP: Unremarkable DVT: Negative  Discussion with Independent Historian: None  Discussion of Management of Tests: None  Risk: Medium: prescription drug management  Risk Stratification Score: none  Staffed with Nanavati, MD  Plan: On exam patient was in no acute distress provide tachycardic at 107.  Labs with triage do show leukocytosis and on exam patient does have right lower extremity that is edematous, warm to the touch.  Patient was neurovasc intact and states this feels similar to previous episodes cellulitis.  Due to patient's history of DVTs and ultrasound was ordered which did not show any blood clots.  Patient not endorsing any chest pain or shortness of breath that necessitates further workup at this time.  I spoke to the patient and patient agreed to be treated in the outpatient setting as she does not meet inpatient criteria at this time.  Patient states she does not want to have Keflex that has not helped her in the past.  Due to patient having multiple failures of outpatient therapy and for her cellulitis including doxycycline and Keflex clindamycin was used.  I spoke to patient how this medication does have a lot of side effects including C. difficile and made the patient aware of the side effects.  Patient verbalized understanding acceptance of these side effects.  Patient was given 1 dose here in the  ED before discharge and prescribed the rest and encouraged follow-up with her primary care provider.  For discharge patient's cellulitis was demarcated with a marker so that we  will be able to track in the future.  Patient was given return precautions. Patient stable for discharge at this time.  Patient verbalized understanding of plan.   Final Clinical Impression(s) / ED Diagnoses Final diagnoses:  Cellulitis of right lower extremity    Rx / DC Orders ED Discharge Orders          Ordered    clindamycin (CLEOCIN) 150 MG capsule  Every 6 hours        04/25/23 0925             Netta Corrigan, PA-C 04/25/23 1610    Derwood Kaplan, MD 04/25/23 1317

## 2023-04-29 DIAGNOSIS — E1169 Type 2 diabetes mellitus with other specified complication: Secondary | ICD-10-CM | POA: Diagnosis not present

## 2023-04-29 DIAGNOSIS — Z7985 Long-term (current) use of injectable non-insulin antidiabetic drugs: Secondary | ICD-10-CM | POA: Diagnosis not present

## 2023-05-03 ENCOUNTER — Other Ambulatory Visit: Payer: Self-pay | Admitting: Physician Assistant

## 2023-05-03 ENCOUNTER — Telehealth: Payer: Self-pay | Admitting: Physician Assistant

## 2023-05-03 NOTE — Telephone Encounter (Signed)
Okay for refills through the rest of this year.  She certainly needs to reestablish with me or one of the APP's to continue medications thereafter.  Thanks.

## 2023-05-03 NOTE — Telephone Encounter (Signed)
This is a patient of Dr Christella Hartigan who was last seen in May 2022.  She has been scheduled to follow up with you in November 2024.  Please advise.

## 2023-05-03 NOTE — Telephone Encounter (Signed)
Inbound call from patient requesting a refill for Rabeprazole and Famotidine medications. Patient is scheduled for 11/20. Please advise, thank you.

## 2023-05-04 MED ORDER — RABEPRAZOLE SODIUM 20 MG PO TBEC: 20.0000 mg | DELAYED_RELEASE_TABLET | Freq: Two times a day (BID) | ORAL | 3 refills | Status: DC

## 2023-05-04 MED ORDER — FAMOTIDINE 40 MG PO TABS: 40.0000 mg | ORAL_TABLET | Freq: Every day | ORAL | 3 refills | Status: DC

## 2023-05-04 NOTE — Telephone Encounter (Signed)
Rx for Aciphex and Pepcid sent to pharmacy as instructed per Dr Meridee Score.

## 2023-05-04 NOTE — Addendum Note (Signed)
Addended by: Lamona Curl on: 05/04/2023 08:45 AM   Modules accepted: Orders

## 2023-05-06 DIAGNOSIS — E1169 Type 2 diabetes mellitus with other specified complication: Secondary | ICD-10-CM | POA: Diagnosis not present

## 2023-05-10 ENCOUNTER — Telehealth: Payer: Self-pay | Admitting: Cardiology

## 2023-05-10 MED ORDER — ROSUVASTATIN CALCIUM 40 MG PO TABS: 40.0000 mg | ORAL_TABLET | Freq: Every day | ORAL | 0 refills | Status: DC

## 2023-05-10 MED ORDER — FUROSEMIDE 40 MG PO TABS: 40.0000 mg | ORAL_TABLET | Freq: Every day | ORAL | 0 refills | Status: DC

## 2023-05-10 NOTE — Telephone Encounter (Signed)
Pt's medications were sent to pt's pharmacy as requested. Confirmation received.  

## 2023-05-10 NOTE — Telephone Encounter (Signed)
*  STAT* If patient is at the pharmacy, call can be transferred to refill team.   1. Which medications need to be refilled? (please list name of each medication and dose if known)   rosuvastatin (CRESTOR) 40 MG tablet  furosemide (LASIX) 40 MG tablet   2. Would you like to learn more about the convenience, safety, & potential cost savings by using the [facility]?   3. Are you open to using the [facility] (Type [facility]. ).   4. Which pharmacy/location (including street and city if local pharmacy) is medication to be sent to?  [id number] - , [place] - [address]   5. Do they need a 30 day or 90 day supply?  90 day  Patient stated she is almost out of these medications.  Patient stated she has appointment scheduled on 11/26.

## 2023-05-12 ENCOUNTER — Other Ambulatory Visit: Payer: Self-pay | Admitting: *Deleted

## 2023-05-12 DIAGNOSIS — I872 Venous insufficiency (chronic) (peripheral): Secondary | ICD-10-CM

## 2023-05-17 ENCOUNTER — Other Ambulatory Visit: Payer: Self-pay | Admitting: Physician Assistant

## 2023-05-18 ENCOUNTER — Telehealth: Payer: Self-pay | Admitting: Cardiology

## 2023-05-18 ENCOUNTER — Ambulatory Visit (HOSPITAL_COMMUNITY)
Admission: RE | Admit: 2023-05-18 | Discharge: 2023-05-18 | Disposition: A | Discharge status: Home / Self Care | Payer: Medicare HMO | Source: Ambulatory Visit | Attending: Vascular Surgery | Admitting: Vascular Surgery

## 2023-05-18 DIAGNOSIS — I872 Venous insufficiency (chronic) (peripheral): Secondary | ICD-10-CM | POA: Diagnosis not present

## 2023-05-18 MED ORDER — ROSUVASTATIN CALCIUM 40 MG PO TABS: 40.0000 mg | ORAL_TABLET | Freq: Every day | ORAL | 0 refills | Status: DC

## 2023-05-18 NOTE — Telephone Encounter (Signed)
Pt's medication was sent to pt's pharmacy as requested. Confirmation received.  °

## 2023-05-18 NOTE — Telephone Encounter (Signed)
Matter has already been addressed.

## 2023-05-18 NOTE — Telephone Encounter (Signed)
*  STAT* If patient is at the pharmacy, call can be transferred to refill team.   1. Which medications need to be refilled? (please list name of each medication and dose if known) rosuvastatin (CRESTOR) 40 MG tablet   2. Which pharmacy/location (including street and city if local pharmacy) is medication to be sent to?  [id number] - [place], [place] - [address]     3. Do they need a 30 day or 90 day supply? 90

## 2023-05-19 ENCOUNTER — Encounter: Payer: Self-pay | Admitting: Vascular Surgery

## 2023-05-19 ENCOUNTER — Ambulatory Visit (INDEPENDENT_AMBULATORY_CARE_PROVIDER_SITE_OTHER): Payer: Medicare HMO | Admitting: Vascular Surgery

## 2023-05-19 ENCOUNTER — Ambulatory Visit: Payer: Medicare HMO | Admitting: Vascular Surgery

## 2023-05-19 VITALS — BP 133/88 | HR 102 | Temp 98.0°F | Resp 20 | Wt 189.0 lb

## 2023-05-19 DIAGNOSIS — I890 Lymphedema, not elsewhere classified: Secondary | ICD-10-CM

## 2023-05-19 DIAGNOSIS — I872 Venous insufficiency (chronic) (peripheral): Secondary | ICD-10-CM | POA: Diagnosis not present

## 2023-05-19 NOTE — Progress Notes (Signed)
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  MEDICAL ISSUES:   COMBINED CHRONIC VENOUS INSUFFICIENCY AND LYMPHEDEMA: This patient has combined chronic venous insufficiency and lymphedema.  Currently based on her duplex scan of the right leg yesterday in the left leg in November 2023 she has some deep venous reflux bilaterally with minimal superficial venous reflux.  Currently I do not think addressing her superficial venous reflux on the right would significantly impact her swelling.  She has a couple short segments of reflux but has competent segments above and below this.  We have again discussed conservative measures.  I have encouraged her to avoid prolonged sitting and standing.  We have discussed the importance of exercise.  She will continue to elevate her legs daily.  She will continue use compression stockings.  I have encouraged her to start using her lymphedema pump again and ideally to elevate her legs at the same time.  I think she could accomplish significant improvement if she elevated her legs while using her pump for an hour a day.  Will set her up for a follow-up study in 1 year of the left leg as this has not been studied since November 2023.  I have explained that we are in the process of trying to set up vein and lymphedema clinic with the physicians assistants and that we will try to get her on that schedule in 1 year.  She can call sooner if she has any problems.    HPI:   Jessica Yates is a pleasant 72 y.o. female who I have been following with combined chronic venous insufficiency and lymphedema.  I last saw her on 07/15/2022.  At that point the swelling in her legs had been fairly stable.  We again discussed conservative measures including compression therapy leg elevation and exercise.  She did have a lymphedema pump but at that time was not using it much.  I encouraged her to use this for 2 hours a day.  She had a venous  duplex scan of the left leg at that time which showed some deep venous reflux in the common femoral vein but no significant superficial venous reflux except for a short segment of the great saphenous vein in the distal thigh where the vein was 4.5 mm in diameter.  In the proximal small saphenous vein there was a short segment of reflux with a diameter 4.1 mm.  She comes in for a 1 year follow-up visit.  Since I saw her last, she has been hospitalized a couple of times with cellulitis.  She had cellulitis in the right leg which was treated as an outpatient in August of this year.  In March April and May of last year she had issues with cellulitis.  She has not recently been using her lymphedema pump.  She does try to elevate her legs and she does wear knee-high compression stockings.  Past Medical History:  Diagnosis Date   Allergic rhinitis    Anemia 2002   Anxiety    Arthritis    Asthma    Blood clot in vein    Cellulitis    Cellulitis of right lower extremity    Chronic diastolic (congestive) heart failure (HCC)    Chronic kidney disease, stage 3 (HCC)    Coagulation defect (HCC)    Cough    Depression 02/06/2008   Diabetes mellitus, type 2 (HCC)    DM2 (diabetes mellitus, type 2) (HCC)  11/18/2022   DVT (deep venous thrombosis) (HCC) 2002   left leg   Dysphagia    Elevated hemoglobin A1c    GERD (gastroesophageal reflux disease)    Glaucoma    Holosystolic murmur    Hyperlipidemia 01/29/2023   Kidney cysts    Leukocytosis    Liver cyst    Lymphedema    Morbid obesity (HCC)    Overactive bladder    Palpitations    Peripheral venous insufficiency    Primary open angle glaucoma (POAG) of both eyes, mild stage    Seasonal allergies    Sickle cell trait (HCC)    Tortuous aorta (HCC)    Unstable gait    Upper airway cough syndrome    Vitamin D deficiency     Family History  Problem Relation Age of Onset   Allergies Mother    Asthma Mother    Other Mother        enlarged  heart   Arthritis Mother    Breast cancer Mother    Diabetes Mother    Hypertension Mother    Cancer Mother        breast   Hyperlipidemia Mother    Heart disease Maternal Grandmother    Breast cancer Maternal Grandmother    Birth defects Maternal Grandmother        breast   Cancer Father 51       leukemia   Allergies Sister    Arthritis Sister     SOCIAL HISTORY: Social History   Tobacco Use   Smoking status: Never   Smokeless tobacco: Never  Substance Use Topics   Alcohol use: No    No Known Allergies  Current Outpatient Medications  Medication Sig Dispense Refill   acetaminophen (TYLENOL) 500 MG tablet Take 1,000 mg by mouth every 6 (six) hours as needed for mild pain.      albuterol (VENTOLIN HFA) 108 (90 Base) MCG/ACT inhaler TAKE 2 PUFFS BY MOUTH EVERY 6 HOURS AS NEEDED FOR WHEEZE OR SHORTNESS OF BREATH 18 each 0   aspirin 81 MG chewable tablet Chew 81 mg by mouth at bedtime.     blood glucose meter kit and supplies KIT Dispense based on patient and insurance preference. Use up to four times daily as directed. (FOR ICD-9 250.00, 250.01). 1 each 0   Cholecalciferol (VITAMIN D3) 25 MCG (1000 UT) capsule Take 1,000 Units by mouth daily.     COD LIVER OIL PO Take 1 tablet by mouth daily.     cyanocobalamin (VITAMIN B12) 500 MCG tablet Take 1 tablet (500 mcg total) by mouth daily. 30 tablet 0   dapagliflozin propanediol (FARXIGA) 10 MG TABS tablet Take 10 mg by mouth daily before breakfast.     famotidine (PEPCID) 40 MG tablet Take 1 tablet (40 mg total) by mouth at bedtime. Please keep appointment scheduled for 08-03-23 for further refills 30 tablet 3   fexofenadine (ALLEGRA) 180 MG tablet Take 180 mg by mouth daily.     fluticasone (FLONASE) 50 MCG/ACT nasal spray Place 2 sprays into both nostrils daily. (Patient taking differently: Place 2 sprays into both nostrils daily as needed for allergies or rhinitis.) 1 g 5   folic acid (FOLVITE) 1 MG tablet Take 1 tablet (1 mg  total) by mouth daily. 30 tablet 0   furosemide (LASIX) 40 MG tablet Take 1 tablet (40 mg total) by mouth daily. 90 tablet 0   GEMTESA 75 MG TABS Take 75 mg by mouth daily.  losartan (COZAAR) 25 MG tablet Take 12.5 mg by mouth See admin instructions. Take 12.5 mg by mouth only when taking Furosemide     Omega-3 Fatty Acids (FISH OIL PO) Take 1 tablet by mouth daily.     OZEMPIC, 1 MG/DOSE, 4 MG/3ML SOPN Inject 1 mg into the skin once a week.     RABEprazole (ACIPHEX) 20 MG tablet Take 1 tablet (20 mg total) by mouth 2 (two) times daily. 60 tablet 3   rosuvastatin (CRESTOR) 40 MG tablet Take 1 tablet (40 mg total) by mouth daily. 90 tablet 0   timolol (TIMOPTIC) 0.5 % ophthalmic solution Place 1 drop into both eyes in the morning and at bedtime.     TRAVATAN Z 0.004 % SOLN ophthalmic solution Place 1 drop into both eyes at bedtime.      iron polysaccharides (NIFEREX) 150 MG capsule Take 1 capsule (150 mg total) by mouth daily. (Patient not taking: Reported on 05/19/2023) 30 capsule 0   No current facility-administered medications for this visit.    REVIEW OF SYSTEMS:  [X]  denotes positive finding, [ ]  denotes negative finding Cardiac  Comments:  Chest pain or chest pressure:    Shortness of breath upon exertion:    Short of breath when lying flat:    Irregular heart rhythm:        Vascular    Pain in calf, thigh, or hip brought on by ambulation:    Pain in feet at night that wakes you up from your sleep:     Blood clot in your veins:    Leg swelling:  x       Pulmonary    Oxygen at home:    Productive cough:     Wheezing:         Neurologic    Sudden weakness in arms or legs:     Sudden numbness in arms or legs:     Sudden onset of difficulty speaking or slurred speech:    Temporary loss of vision in one eye:     Problems with dizziness:         Gastrointestinal    Blood in stool:     Vomited blood:         Genitourinary    Burning when urinating:     Blood in urine:         Psychiatric    Major depression:         Hematologic    Bleeding problems:    Problems with blood clotting too easily:        Skin    Rashes or ulcers:        Constitutional    Fever or chills:     PHYSICAL EXAM:   Vitals:   05/19/23 0930  BP: 133/88  Pulse: (!) 102  Resp: 20  Temp: 98 F (36.7 C)  SpO2: 92%  Weight: 189 lb (85.7 kg)    GENERAL: The patient is a well-nourished female, in no acute distress. The vital signs are documented above. CARDIAC: There is a regular rate and rhythm.  VASCULAR: I do not detect carotid bruits. I cannot palpate pedal pulses because of her leg swelling.  However, she has a biphasic dorsalis pedis and posterior tibial signal bilaterally. She has bilateral lower extremity swelling and also some hyperpigmentation consistent with chronic venous insufficiency. PULMONARY: There is good air exchange bilaterally without wheezing or rales. ABDOMEN: Soft and non-tender with normal pitched bowel sounds.  MUSCULOSKELETAL: There  are no major deformities or cyanosis. NEUROLOGIC: No focal weakness or paresthesias are detected. SKIN: There are no ulcers or rashes noted. PSYCHIATRIC: The patient has a normal affect.  DATA:    VENOUS DUPLEX: I have independently interpreted her venous duplex scan that was done yesterday.  This was of the right lower extremity only.  There was no evidence of DVT.  There was deep venous reflux in the common femoral vein.  There was superficial venous reflux in the great saphenous vein in the mid thigh and at the knee but the vein was competent in the proximal thigh and distal thigh.  Diameters of the vein ranged from 2.3-5.5 mm.  The results of the study are summarized in the diagram below.    The results of the venous duplex scan from 07/15/2022 are summarized in the diagram below.    Waverly [facility] [phone]

## 2023-05-20 DIAGNOSIS — E1169 Type 2 diabetes mellitus with other specified complication: Secondary | ICD-10-CM | POA: Diagnosis not present

## 2023-05-26 DIAGNOSIS — Q6102 Congenital multiple renal cysts: Secondary | ICD-10-CM | POA: Diagnosis not present

## 2023-05-26 DIAGNOSIS — E1122 Type 2 diabetes mellitus with diabetic chronic kidney disease: Secondary | ICD-10-CM | POA: Diagnosis not present

## 2023-05-26 DIAGNOSIS — R3129 Other microscopic hematuria: Secondary | ICD-10-CM | POA: Diagnosis not present

## 2023-05-26 DIAGNOSIS — N1831 Chronic kidney disease, stage 3a: Secondary | ICD-10-CM | POA: Diagnosis not present

## 2023-05-26 DIAGNOSIS — I890 Lymphedema, not elsewhere classified: Secondary | ICD-10-CM | POA: Diagnosis not present

## 2023-05-27 DIAGNOSIS — Z7985 Long-term (current) use of injectable non-insulin antidiabetic drugs: Secondary | ICD-10-CM | POA: Diagnosis not present

## 2023-05-27 DIAGNOSIS — E1169 Type 2 diabetes mellitus with other specified complication: Secondary | ICD-10-CM | POA: Diagnosis not present

## 2023-05-31 ENCOUNTER — Other Ambulatory Visit: Payer: Self-pay

## 2023-05-31 DIAGNOSIS — I872 Venous insufficiency (chronic) (peripheral): Secondary | ICD-10-CM

## 2023-06-03 DIAGNOSIS — E1169 Type 2 diabetes mellitus with other specified complication: Secondary | ICD-10-CM | POA: Diagnosis not present

## 2023-06-03 DIAGNOSIS — Z7985 Long-term (current) use of injectable non-insulin antidiabetic drugs: Secondary | ICD-10-CM | POA: Diagnosis not present

## 2023-06-09 DIAGNOSIS — E1169 Type 2 diabetes mellitus with other specified complication: Secondary | ICD-10-CM | POA: Diagnosis not present

## 2023-06-09 DIAGNOSIS — I890 Lymphedema, not elsewhere classified: Secondary | ICD-10-CM | POA: Diagnosis not present

## 2023-06-09 DIAGNOSIS — I10 Essential (primary) hypertension: Secondary | ICD-10-CM | POA: Diagnosis not present

## 2023-06-11 ENCOUNTER — Encounter (HOSPITAL_COMMUNITY): Payer: Self-pay

## 2023-06-11 ENCOUNTER — Other Ambulatory Visit: Payer: Self-pay

## 2023-06-11 ENCOUNTER — Emergency Department (HOSPITAL_COMMUNITY)
Admission: EM | Admit: 2023-06-11 | Discharge: 2023-06-11 | Disposition: A | Discharge status: Home / Self Care | Payer: Medicare HMO | Attending: Emergency Medicine | Admitting: Emergency Medicine

## 2023-06-11 DIAGNOSIS — Z7982 Long term (current) use of aspirin: Secondary | ICD-10-CM | POA: Diagnosis not present

## 2023-06-11 DIAGNOSIS — D72829 Elevated white blood cell count, unspecified: Secondary | ICD-10-CM | POA: Insufficient documentation

## 2023-06-11 DIAGNOSIS — L03115 Cellulitis of right lower limb: Secondary | ICD-10-CM | POA: Diagnosis not present

## 2023-06-11 DIAGNOSIS — M79604 Pain in right leg: Secondary | ICD-10-CM | POA: Diagnosis present

## 2023-06-11 DIAGNOSIS — E119 Type 2 diabetes mellitus without complications: Secondary | ICD-10-CM | POA: Insufficient documentation

## 2023-06-11 DIAGNOSIS — J45909 Unspecified asthma, uncomplicated: Secondary | ICD-10-CM | POA: Insufficient documentation

## 2023-06-11 DIAGNOSIS — I5030 Unspecified diastolic (congestive) heart failure: Secondary | ICD-10-CM | POA: Diagnosis not present

## 2023-06-11 DIAGNOSIS — E1122 Type 2 diabetes mellitus with diabetic chronic kidney disease: Secondary | ICD-10-CM | POA: Diagnosis not present

## 2023-06-11 DIAGNOSIS — N183 Chronic kidney disease, stage 3 unspecified: Secondary | ICD-10-CM | POA: Diagnosis not present

## 2023-06-11 LAB — I-STAT CG4 LACTIC ACID, ED
Lactic Acid, Venous: 2.3 mmol/L (ref 0.5–1.9)
Lactic Acid, Venous: 1 mmol/L (ref 0.5–1.9)

## 2023-06-11 LAB — COMPREHENSIVE METABOLIC PANEL
ALT: 20 U/L (ref 0–44)
AST: 30 U/L (ref 15–41)
Albumin: 3.8 g/dL (ref 3.5–5.0)
Alkaline Phosphatase: 83 U/L (ref 38–126)
Anion gap: 11 (ref 5–15)
BUN: 15 mg/dL (ref 8–23)
CO2: 24 mmol/L (ref 22–32)
Calcium: 10.2 mg/dL (ref 8.9–10.3)
Chloride: 103 mmol/L (ref 98–111)
Creatinine, Ser: 1.02 mg/dL — ABNORMAL HIGH (ref 0.44–1.00)
GFR, Estimated: 58 mL/min — ABNORMAL LOW (ref >60)
Glucose, Bld: 98 mg/dL (ref 70–99)
Potassium: 3.6 mmol/L (ref 3.5–5.1)
Sodium: 138 mmol/L (ref 135–145)
Total Bilirubin: 1 mg/dL (ref 0.3–1.2)
Total Protein: 8.4 g/dL — ABNORMAL HIGH (ref 6.5–8.1)

## 2023-06-11 LAB — CBC WITH DIFFERENTIAL/PLATELET
Abs Immature Granulocytes: 0.1 10*3/uL — ABNORMAL HIGH (ref 0.00–0.07)
Basophils Absolute: 0 10*3/uL (ref 0.0–0.1)
Basophils Relative: 0 %
Eosinophils Absolute: 0 10*3/uL (ref 0.0–0.5)
Eosinophils Relative: 0 %
HCT: 36.1 % (ref 36.0–46.0)
Hemoglobin: 11.4 g/dL — ABNORMAL LOW (ref 12.0–15.0)
Immature Granulocytes: 1 %
Lymphocytes Relative: 5 %
Lymphs Abs: 0.8 10*3/uL (ref 0.7–4.0)
MCH: 25.9 pg — ABNORMAL LOW (ref 26.0–34.0)
MCHC: 31.6 g/dL (ref 30.0–36.0)
MCV: 82 fL (ref 80.0–100.0)
Monocytes Absolute: 0.4 10*3/uL (ref 0.1–1.0)
Monocytes Relative: 3 %
Neutro Abs: 15.1 10*3/uL — ABNORMAL HIGH (ref 1.7–7.7)
Neutrophils Relative %: 91 %
Platelets: 183 10*3/uL (ref 150–400)
RBC: 4.4 MIL/uL (ref 3.87–5.11)
RDW: 18 % — ABNORMAL HIGH (ref 11.5–15.5)
WBC: 16.4 10*3/uL — ABNORMAL HIGH (ref 4.0–10.5)
nRBC: 0 % (ref 0.0–0.2)

## 2023-06-11 LAB — CBG MONITORING, ED: Glucose-Capillary: 109 mg/dL — ABNORMAL HIGH (ref 70–99)

## 2023-06-11 MED ORDER — AMOXICILLIN-POT CLAVULANATE 875-125 MG PO TABS: 1.0000 | ORAL_TABLET | Freq: Two times a day (BID) | ORAL | 0 refills | Status: DC

## 2023-06-11 MED ORDER — SODIUM CHLORIDE 0.9 % IV BOLUS (SEPSIS)
1000.0000 mL | Freq: Once | INTRAVENOUS | Status: AC
  Administered 2023-06-11: 1000 mL via INTRAVENOUS

## 2023-06-11 MED ORDER — DOXYCYCLINE HYCLATE 100 MG PO CAPS: 100.0000 mg | ORAL_CAPSULE | Freq: Two times a day (BID) | ORAL | 0 refills | Status: DC

## 2023-06-11 MED ORDER — SODIUM CHLORIDE 0.9 % IV SOLN
2.0000 g | Freq: Once | INTRAVENOUS | Status: AC
  Administered 2023-06-11: 2 g via INTRAVENOUS
  Filled 2023-06-11: qty 20

## 2023-06-11 NOTE — ED Provider Notes (Signed)
[place] EMERGENCY DEPARTMENT AT [facility] Provider Note   CSN: [id number] Arrival date & time: 06/11/23  0141     History  Chief Complaint  Patient presents with   Leg Pain    Jessica Yates is a 72 y.o. female.  The history is provided by the patient.  Patient with extensive history including diastolic heart failure, chronic lymphedema, previous episodes of cellulitis to lower extremities, previous DVT, diabetes presents with right leg swelling and pain.  She reports over the past day she has had increasing pain and swelling in the right leg and she is concerned about cellulitis.  She reports fever at home up to 102 and took Tylenol with some improvement.  No other acute complaints.  No chest pain or shortness of breath.  No vomiting or diarrhea.  No cough    Past Medical History:  Diagnosis Date   Allergic rhinitis    Anemia 2002   Anxiety    Arthritis    Asthma    Blood clot in vein    Cellulitis    Cellulitis of right lower extremity    Chronic diastolic (congestive) heart failure (HCC)    Chronic kidney disease, stage 3 (HCC)    Coagulation defect (HCC)    Cough    Depression 02/06/2008   Diabetes mellitus, type 2 (HCC)    DM2 (diabetes mellitus, type 2) (HCC) 11/18/2022   DVT (deep venous thrombosis) (HCC) 2002   left leg   Dysphagia    Elevated hemoglobin A1c    GERD (gastroesophageal reflux disease)    Glaucoma    Holosystolic murmur    Hyperlipidemia 01/29/2023   Kidney cysts    Leukocytosis    Liver cyst    Lymphedema    Morbid obesity (HCC)    Overactive bladder    Palpitations    Peripheral venous insufficiency    Primary open angle glaucoma (POAG) of both eyes, mild stage    Seasonal allergies    Sickle cell trait (HCC)    Tortuous aorta (HCC)    Unstable gait    Upper airway cough syndrome    Vitamin D deficiency     Home Medications Prior to Admission medications   Medication Sig Start Date End Date Taking? Authorizing  Provider  amoxicillin-clavulanate (AUGMENTIN) 875-125 MG tablet Take 1 tablet by mouth every 12 (twelve) hours. 06/11/23  Yes Zadie Rhine, MD  doxycycline (VIBRAMYCIN) 100 MG capsule Take 1 capsule (100 mg total) by mouth 2 (two) times daily. One po bid x 7 days 06/11/23  Yes Zadie Rhine, MD  acetaminophen (TYLENOL) 500 MG tablet Take 1,000 mg by mouth every 6 (six) hours as needed for mild pain.     [provider]  albuterol (VENTOLIN HFA) 108 (90 Base) MCG/ACT inhaler TAKE 2 PUFFS BY MOUTH EVERY 6 HOURS AS NEEDED FOR WHEEZE OR SHORTNESS OF BREATH 02/14/23   Kalman Shan, MD  aspirin 81 MG chewable tablet Chew 81 mg by mouth at bedtime.    [provider]  blood glucose meter kit and supplies KIT Dispense based on patient and insurance preference. Use up to four times daily as directed. (FOR ICD-9 250.00, 250.01). 05/20/19   Darlin Drop, DO  Cholecalciferol (VITAMIN D3) 25 MCG (1000 UT) capsule Take 1,000 Units by mouth daily.    [provider]  COD LIVER OIL PO Take 1 tablet by mouth daily.    [provider]  cyanocobalamin (VITAMIN B12) 500 MCG tablet  Take 1 tablet (500 mcg total) by mouth daily. 02/02/23   Regalado, Belkys A, MD  dapagliflozin propanediol (FARXIGA) 10 MG TABS tablet Take 10 mg by mouth daily before breakfast.    [provider]  famotidine (PEPCID) 40 MG tablet Take 1 tablet (40 mg total) by mouth at bedtime. Please keep appointment scheduled for 08-03-23 for further refills 05/04/23   Mansouraty, Netty Starring., MD  fexofenadine (ALLEGRA) 180 MG tablet Take 180 mg by mouth daily. 11/17/22   [provider]  fluticasone (FLONASE) 50 MCG/ACT nasal spray Place 2 sprays into both nostrils daily. Patient taking differently: Place 2 sprays into both nostrils daily as needed for allergies or rhinitis. 07/02/22   Parrett, Virgel Bouquet, NP  folic acid (FOLVITE) 1 MG tablet Take 1 tablet (1 mg total) by mouth daily. 02/01/23    Regalado, Belkys A, MD  furosemide (LASIX) 40 MG tablet Take 1 tablet (40 mg total) by mouth daily. 05/10/23   Dunn, Dayna N, PA-C  GEMTESA 75 MG TABS Take 75 mg by mouth daily.    [provider]  iron polysaccharides (NIFEREX) 150 MG capsule Take 1 capsule (150 mg total) by mouth daily. Patient not taking: Reported on 05/19/2023 02/01/23   Regalado, Jon Billings A, MD  losartan (COZAAR) 25 MG tablet Take 12.5 mg by mouth See admin instructions. Take 12.5 mg by mouth only when taking Furosemide 05/04/22   [provider]  Omega-3 Fatty Acids (FISH OIL PO) Take 1 tablet by mouth daily.    [provider]  OZEMPIC, 1 MG/DOSE, 4 MG/3ML SOPN Inject 1 mg into the skin once a week. 05/14/23   [provider]  RABEprazole (ACIPHEX) 20 MG tablet Take 1 tablet (20 mg total) by mouth 2 (two) times daily. 05/04/23   Mansouraty, Netty Starring., MD  rosuvastatin (CRESTOR) 40 MG tablet Take 1 tablet (40 mg total) by mouth daily. 05/18/23   Nahser, Deloris Ping, MD  timolol (TIMOPTIC) 0.5 % ophthalmic solution Place 1 drop into both eyes in the morning and at bedtime. 07/16/21   [provider]  TRAVATAN Z 0.004 % SOLN ophthalmic solution Place 1 drop into both eyes at bedtime.  03/14/11   [provider]      Allergies    Patient has no known allergies.    Review of Systems   Review of Systems  Constitutional:  Positive for fever.  Respiratory:  Negative for cough.   Cardiovascular:  Positive for leg swelling.  Gastrointestinal:  Negative for diarrhea and vomiting.  Musculoskeletal:  Positive for arthralgias.    Physical Exam Updated Vital Signs BP (!) 103/53 (BP Location: Left Arm)   Pulse 93   Temp (!) 97.4 F (36.3 C) (Oral)   Resp 16   Ht 1.702 m (5\' 7" )   Wt 85.7 kg   SpO2 96%   BMI 29.59 kg/m  Physical Exam CONSTITUTIONAL: Chronically ill-appearing, no acute distress HEAD: Normocephalic/atraumatic ENMT: Mucous membranes moist NECK: supple no meningeal  signs SPINE/BACK: Kyphotic spine CV: S1/S2 noted, no murmurs/rubs/gallops noted LUNGS: Lungs are clear to auscultation bilaterally, no apparent distress ABDOMEN: soft, nontender NEURO: Pt is awake/alert/appropriate, moves all extremitiesx4.  No facial droop.   EXTREMITIES: Chronic lymphedema bilateral lower extremities.  Right lower extremity has increased edema, erythema noted.  No bruising or crepitus.  No deformities. Distal pulses intact SKIN: See photo PSYCH: no abnormalities of mood noted, alert and oriented to situation Patient gave verbal permission to utilize photo for medical documentation  only The image was not stored on any personal device  ED Results / Procedures / Treatments   Labs (all labs ordered are listed, but only abnormal results are displayed) Labs Reviewed  COMPREHENSIVE METABOLIC PANEL - Abnormal; Notable for the following components:      Result Value   Creatinine, Ser 1.02 (*)    Total Protein 8.4 (*)    GFR, Estimated 58 (*)    All other components within normal limits  CBC WITH DIFFERENTIAL/PLATELET - Abnormal; Notable for the following components:   WBC 16.4 (*)    Hemoglobin 11.4 (*)    MCH 25.9 (*)    RDW 18.0 (*)    Neutro Abs 15.1 (*)    Abs Immature Granulocytes 0.10 (*)    All other components within normal limits  CBG MONITORING, ED - Abnormal; Notable for the following components:   Glucose-Capillary 109 (*)    All other components within normal limits  I-STAT CG4 LACTIC ACID, ED - Abnormal; Notable for the following components:   Lactic Acid, Venous 2.3 (*)    All other components within normal limits  CULTURE, BLOOD (ROUTINE X 2)  CULTURE, BLOOD (ROUTINE X 2)  I-STAT CG4 LACTIC ACID, ED    EKG None  Radiology No results found.  Procedures Procedures    Medications Ordered in ED Medications  cefTRIAXone (ROCEPHIN) 2 g in sodium chloride 0.9 % 100 mL IVPB (0 g Intravenous Stopped 06/11/23 0415)  sodium chloride 0.9 % bolus  1,000 mL (1,000 mLs Intravenous New Bag/Given 06/11/23 0408)    ED Course/ Medical Decision Making/ A&P Clinical Course as of 06/11/23 0546  Sat Jun 11, 2023  0314 Patient with long history of chronic lymphedema with recurrent cellulitis typically in the right leg.  She has obvious cellulitis on exam.  Low suspicion for necrotizing fasciitis or deep space infection.  No recent trauma, therefore will defer any imaging.  Low suspicion for recurrent DVT and had a recent negative study [DW]  0545 Patient monitored for several hours in the ER.  Her vital signs have remained appropriate.  She has been ambulatory.  She is afebrile here.  Lactate is improved, low suspicion for sepsis. [DW]  [phone] Patient feels comfortable for discharge home.  She has responded well previously to dual antibiotic with Augmentin and doxycycline.  This has been sent to her pharmacy.  We discussed strict ER return precautions. Discussed need for PCP follow-up in about a week [DW]    Clinical Course User Index [DW] Zadie Rhine, MD                                 Medical Decision Making Amount and/or Complexity of Data Reviewed Labs: ordered.  Risk Prescription drug management.   This patient presents to the ED for concern of leg pain, this involves an extensive number of treatment options, and is a complaint that carries with it a high risk of complications and morbidity.  The differential diagnosis includes but is not limited to cellulitis, DVT, necrotizing fasciitis, lymphedema  Comorbidities that complicate the patient evaluation: Patient's presentation is complicated by their history of chronic lymphedema and CHF  Social Determinants of Health: Patient's  multiple ER visits   increases the complexity of managing their presentation  Additional history obtained: Records reviewed previous admission documents  Lab Tests: I Ordered, and personally interpreted labs.  The pertinent results include:  Leukocytosis   Medicines  ordered and prescription drug management: I ordered medication including Rocephin for cellulitis Reevaluation of the patient after these medicines showed that the patient    improved  Test Considered: Patient has had multiple DVT studies that have been negative, most recent earlier this month, will defer x-ray or DVT study for now   Critical Interventions:   IV antibiotics   Reevaluation: After the interventions noted above, I reevaluated the patient and found that they have :improved  Complexity of problems addressed: Patient's presentation is most consistent with  acute presentation with potential threat to life or bodily function  Disposition: After consideration of the diagnostic results and the patient's response to treatment,  I feel that the patent would benefit from discharge   .           Final Clinical Impression(s) / ED Diagnoses Final diagnoses:  Cellulitis of right lower extremity    Rx / DC Orders ED Discharge Orders          Ordered    amoxicillin-clavulanate (AUGMENTIN) 875-125 MG tablet  Every 12 hours        06/11/23 0544    doxycycline (VIBRAMYCIN) 100 MG capsule  2 times daily        06/11/23 0544              Zadie Rhine, MD 06/11/23 [phone]

## 2023-06-11 NOTE — ED Triage Notes (Signed)
Pt states that she may have cellulitis again in both legs. Pt has pain, swelling, and warmth. Pt reports having a fever and taking Tylenol around 8 pm.

## 2023-06-11 NOTE — ED Notes (Addendum)
2:20 AM  Patient reports RLE pain and swelling that started earlier yesterday evening and progressively worsened. She is currently rating pain 8/10 on pain scale and states that she is having difficulty ambulating as a result of the pain. Edema noted to bilateral extremities. RLE is warm to touch with redness and swelling present. Cap refill intact. Sensation intact. Patient is able to move and ambulate on extremity. She endorses having a fever of 102.0 at approximately 2100 and states that she did take two extra strength tylenol prior to arrival to ER. She is alert and oriented x 4. She denies any nausea, vomiting, CP, SOB, dizziness, lightheadedness, numbness, or tingling. She has equal rise and fall of the chest wall with clear lung sounds. Call light in reach. Bed in lowest position. IV started. Pending ER provider assessment and evaluation.   2:45 AM  MD at bedside.   3:28 AM  Patient sleeping with equal rise and fall of the chest wall. Patient in NAD. No needs voiced by patient at this time. Call light in reach. Bed in lowest position. Pending lab results.   4:33 AM  Patient sleeping with even respirations. Patient in NAD. No needs at this time. Call light in reach. Bed in lowest position. Plan of care ongoing.   6:11 AM  Discharge instructions discussed with patient. Patient voices understanding of discharge instructions and denies any questions, needs, or concerns regarding discharge. Prescription information discussed with patient using teach-back method. Patient is stable at discharge. Patient able to ambulate independently with cane but provided wheelchair upon discharge. Patient reports sister to pick her up and transport her home.

## 2023-06-14 ENCOUNTER — Ambulatory Visit (INDEPENDENT_AMBULATORY_CARE_PROVIDER_SITE_OTHER): Payer: Medicare HMO | Admitting: Podiatry

## 2023-06-14 DIAGNOSIS — Z91199 Patient's noncompliance with other medical treatment and regimen due to unspecified reason: Secondary | ICD-10-CM

## 2023-06-14 NOTE — Progress Notes (Signed)
1. No-show for appointment     

## 2023-06-16 LAB — CULTURE, BLOOD (ROUTINE X 2)
Culture: NO GROWTH
Special Requests: ADEQUATE

## 2023-06-17 DIAGNOSIS — E1169 Type 2 diabetes mellitus with other specified complication: Secondary | ICD-10-CM | POA: Diagnosis not present

## 2023-06-17 DIAGNOSIS — Z7985 Long-term (current) use of injectable non-insulin antidiabetic drugs: Secondary | ICD-10-CM | POA: Diagnosis not present

## 2023-06-22 ENCOUNTER — Encounter: Payer: Self-pay | Admitting: Podiatry

## 2023-06-22 ENCOUNTER — Ambulatory Visit: Payer: Medicare HMO | Admitting: Podiatry

## 2023-06-22 DIAGNOSIS — N183 Chronic kidney disease, stage 3 unspecified: Secondary | ICD-10-CM

## 2023-06-22 DIAGNOSIS — B351 Tinea unguium: Secondary | ICD-10-CM | POA: Diagnosis not present

## 2023-06-22 DIAGNOSIS — E0822 Diabetes mellitus due to underlying condition with diabetic chronic kidney disease: Secondary | ICD-10-CM

## 2023-06-22 DIAGNOSIS — Z794 Long term (current) use of insulin: Secondary | ICD-10-CM

## 2023-06-22 DIAGNOSIS — M79674 Pain in right toe(s): Secondary | ICD-10-CM | POA: Diagnosis not present

## 2023-06-22 DIAGNOSIS — M79675 Pain in left toe(s): Secondary | ICD-10-CM

## 2023-06-22 NOTE — Progress Notes (Signed)
Subjective:  Patient ID: Jessica Yates, female    DOB: Feb 09, 1951,  MRN: [id number]  Jessica Yates presents to clinic today for at risk foot care. Pt has h/o NIDDM with chronic kidney disease and painful thick toenails that are difficult to trim. Pain interferes with ambulation. Aggravating factors include wearing enclosed shoe gear. Pain is relieved with periodic professional debridement. Patient has been hospitalized with cellulitis again since her last visit. Chief Complaint  Patient presents with   [facility]    BS-84 A1C-6.9 LVPCP-06/09/2023   New problem(s): None.   PCP is Renaye Rakers, MD.  No Known Allergies  Review of Systems: Negative except as noted in the HPI.  Objective:  There were no vitals filed for this visit. Jessica Yates is a pleasant 72 y.o. female in NAD. AAO x 3.  Vascular Examination: CFT <3 seconds b/l. DP/PT pulses faintly palpable b/l. Skin temperature gradient warm to warm b/l. No pain with calf compression. No ischemia or gangrene. No cyanosis or clubbing noted b/l. Lymphedema present BLE.   Neurological Examination: Sensation grossly intact b/l with 10 gram monofilament. Vibratory sensation intact b/l. Pt has subjective symptoms of neuropathy.   Dermatological Examination: Pedal skin warm and supple b/l.   No open wounds. No interdigital macerations.  Toenails 1-5 b/l thick, discolored, elongated with subungual debris and pain on dorsal palpation.    Skin changes consistent with venous stasis noted b/l lower extremities. Hyperpigmentation consistent with findings of chronic venous insufficiency is present b/l lower extremities.  Musculoskeletal Examination: Muscle strength 5/5 to b/l LE. HAV with bunion deformity noted b/l LE. Pes planus deformity noted bilateral LE.  Radiographs: None Assessment/Plan: 1. Pain due to onychomycosis of toenails of both feet   2. Diabetes mellitus due to underlying condition with stage 3 chronic kidney  disease, with long-term current use of insulin, unspecified whether stage 3a or 3b CKD (HCC)     -Patient was evaluated and treated. All patient's and/or POA's questions/concerns answered on today's visit. -Patient to continue soft, supportive shoe gear daily. -Mycotic toenails 1-5 bilaterally were debrided in length and girth with sterile nail nippers and dremel without incident. -Patient/POA to call should there be question/concern in the interim.   Return in about 3 months (around 09/22/2023).  [name], DPM

## 2023-06-28 DIAGNOSIS — Z7985 Long-term (current) use of injectable non-insulin antidiabetic drugs: Secondary | ICD-10-CM | POA: Diagnosis not present

## 2023-06-28 DIAGNOSIS — E1169 Type 2 diabetes mellitus with other specified complication: Secondary | ICD-10-CM | POA: Diagnosis not present

## 2023-06-28 DIAGNOSIS — Z79899 Other long term (current) drug therapy: Secondary | ICD-10-CM | POA: Diagnosis not present

## 2023-06-30 DIAGNOSIS — E1169 Type 2 diabetes mellitus with other specified complication: Secondary | ICD-10-CM | POA: Diagnosis not present

## 2023-07-08 DIAGNOSIS — E1169 Type 2 diabetes mellitus with other specified complication: Secondary | ICD-10-CM | POA: Diagnosis not present

## 2023-07-12 ENCOUNTER — Ambulatory Visit: Payer: Medicare HMO | Attending: Cardiovascular Disease | Admitting: Cardiovascular Disease

## 2023-07-12 ENCOUNTER — Encounter: Payer: Self-pay | Admitting: Cardiovascular Disease

## 2023-07-12 VITALS — BP 98/66 | HR 86 | Ht 67.0 in | Wt 194.6 lb

## 2023-07-12 DIAGNOSIS — I5032 Chronic diastolic (congestive) heart failure: Secondary | ICD-10-CM

## 2023-07-12 MED ORDER — FUROSEMIDE 40 MG PO TABS: 40.0000 mg | ORAL_TABLET | Freq: Every day | ORAL | 3 refills | Status: DC

## 2023-07-12 MED ORDER — ROSUVASTATIN CALCIUM 40 MG PO TABS: 40.0000 mg | ORAL_TABLET | Freq: Every day | ORAL | 3 refills | Status: DC

## 2023-07-12 NOTE — Progress Notes (Signed)
Cardiology Office Note:  .   Date:  07/12/2023  ID:  Jessica Yates, DOB 1951/08/07, MRN [id number] PCP: Renaye Rakers, MD  [facility] Providers Cardiologist:  Meriam Sprague, MD (Inactive)    History of Present Illness: .    Oct. 29, 2024 Jessica Yates is a 72 y.o. female patient of Dr. Shari Prows.  I am meeting her for the first time today   She has a history of asthma, diastolic dysfunction, chronic lymphedema, type 2 diabetes mellitus, CKD, DVT  She has a history of chronic lymphedema ( since age 14) managed by the vascular surgeons.  She has difficulty ambulating due to her lower extremity edema.  She has grade 1 diastolic dysfunction.  She has normal left ventricular systolic function. She has been managed on Lasix 40 mg a day potassium 20 mill equivalents a day.  Breathing has been steady, Rare episodes of chest tightness  Still eating some processed meats on occasion   ROS:   Studies Reviewed: .         Risk Assessment/Calculations:           Physical Exam:   VS:  BP 98/66   Pulse 86   Ht 5\' 7"  (1.702 m)   Wt 194 lb 9.6 oz (88.3 kg)   SpO2 97%   BMI 30.48 kg/m    Wt Readings from Last 3 Encounters:  07/12/23 194 lb 9.6 oz (88.3 kg)  06/11/23 188 lb 15 oz (85.7 kg)  05/19/23 189 lb (85.7 kg)    GEN: Well nourished, well developed in no acute distress NECK: No JVD; No carotid bruits CARDIAC: RRR, no murmurs, rubs, gallops RESPIRATORY:  Clear to auscultation without rales, wheezing or rhonchi  ABDOMEN: Soft, non-tender, non-distended EXTREMITIES:  No edema; No deformity   ASSESSMENT AND PLAN: .   1 chronic diastolic congestive heart failure.  Her chronic diastolic congestive heart failure remains stable.  She is basically compliant with her Lasix although she occasionally misses a dose.  She still eats some faults of salty foods.  I have advised her to avoid eating salty foods.  2.  Chronic lymphedema: She is followed by the vascular  surgeons.  3.  Hyperlipidemia: Continue rosuvastatin.  She has a coronary calcium score of 18.8 which places her in the 52nd percentile for age and sex matched controls.   Will see her back in 1 year .       Dispo:   Signed, Kristeen Miss, MD

## 2023-07-12 NOTE — Patient Instructions (Signed)
Medication Instructions:  REFILLED Furosemide and Rosuvastatin *If you need a refill on your cardiac medications before your next appointment, please call your pharmacy*  Follow-Up: At [facility], you and your health needs are our priority.  As part of our continuing mission to provide you with exceptional heart care, we have created designated Provider Care Teams.  These Care Teams include your primary Cardiologist (physician) and Advanced Practice Providers (APPs -  Physician Assistants and Nurse Practitioners) who all work together to provide you with the care you need, when you need it.  We recommend signing up for the patient portal called "MyChart".  Sign up information is provided on this After Visit Summary.  MyChart is used to connect with patients for Virtual Visits (Telemedicine).  Patients are able to view lab/test results, encounter notes, upcoming appointments, etc.  Non-urgent messages can be sent to your provider as well.   To learn more about what you can do with MyChart, go to [email or web address].    Your next appointment:   1 year(s)  Provider:   Kristeen Miss, MD

## 2023-07-15 DIAGNOSIS — E1169 Type 2 diabetes mellitus with other specified complication: Secondary | ICD-10-CM | POA: Diagnosis not present

## 2023-07-25 DIAGNOSIS — E1169 Type 2 diabetes mellitus with other specified complication: Secondary | ICD-10-CM | POA: Diagnosis not present

## 2023-07-26 DIAGNOSIS — H524 Presbyopia: Secondary | ICD-10-CM | POA: Diagnosis not present

## 2023-07-26 DIAGNOSIS — H2513 Age-related nuclear cataract, bilateral: Secondary | ICD-10-CM | POA: Diagnosis not present

## 2023-07-26 DIAGNOSIS — E119 Type 2 diabetes mellitus without complications: Secondary | ICD-10-CM | POA: Diagnosis not present

## 2023-07-26 DIAGNOSIS — H401133 Primary open-angle glaucoma, bilateral, severe stage: Secondary | ICD-10-CM | POA: Diagnosis not present

## 2023-07-27 ENCOUNTER — Telehealth: Payer: Self-pay | Admitting: Physician Assistant

## 2023-07-27 NOTE — Telephone Encounter (Signed)
Inbound call from patient requesting a call to discuss a sooner appointment due to excessive coughing. Patient is scheduled for 11/20 with Dr. Meridee Score. Advised patient as of now there are no sooner appointment. Requesting to speak with nurse regarding cough. Please advise, thank you.

## 2023-07-28 DIAGNOSIS — E1169 Type 2 diabetes mellitus with other specified complication: Secondary | ICD-10-CM | POA: Diagnosis not present

## 2023-07-28 NOTE — Telephone Encounter (Signed)
Inbound call from patient, stated she would like to speak to Dr. Elesa Hacker nurse.

## 2023-07-28 NOTE — Telephone Encounter (Signed)
Mailbox is full and cannot accept any messages at this time.

## 2023-08-02 DIAGNOSIS — Z7985 Long-term (current) use of injectable non-insulin antidiabetic drugs: Secondary | ICD-10-CM | POA: Diagnosis not present

## 2023-08-02 DIAGNOSIS — E1169 Type 2 diabetes mellitus with other specified complication: Secondary | ICD-10-CM | POA: Diagnosis not present

## 2023-08-03 ENCOUNTER — Ambulatory Visit (INDEPENDENT_AMBULATORY_CARE_PROVIDER_SITE_OTHER): Payer: Medicare HMO | Admitting: Gastroenterology

## 2023-08-03 ENCOUNTER — Encounter: Payer: Self-pay | Admitting: Gastroenterology

## 2023-08-03 ENCOUNTER — Other Ambulatory Visit: Payer: Self-pay | Admitting: Gastroenterology

## 2023-08-03 VITALS — BP 132/70 | HR 104 | Ht 67.0 in | Wt 201.6 lb

## 2023-08-03 DIAGNOSIS — K219 Gastro-esophageal reflux disease without esophagitis: Secondary | ICD-10-CM

## 2023-08-03 DIAGNOSIS — Z860101 Personal history of adenomatous and serrated colon polyps: Secondary | ICD-10-CM | POA: Diagnosis not present

## 2023-08-03 DIAGNOSIS — K589 Irritable bowel syndrome without diarrhea: Secondary | ICD-10-CM | POA: Diagnosis not present

## 2023-08-03 DIAGNOSIS — R053 Chronic cough: Secondary | ICD-10-CM

## 2023-08-03 MED ORDER — ESOMEPRAZOLE MAGNESIUM 40 MG PO CPDR: 40.0000 mg | DELAYED_RELEASE_CAPSULE | Freq: Two times a day (BID) | ORAL | 6 refills | Status: DC

## 2023-08-03 MED ORDER — HYOSCYAMINE SULFATE 0.125 MG PO TABS: 0.1250 mg | ORAL_TABLET | Freq: Two times a day (BID) | ORAL | 0 refills | Status: DC | PRN

## 2023-08-03 NOTE — Progress Notes (Signed)
GASTROENTEROLOGY OUTPATIENT CLINIC VISIT   Primary Care Provider Renaye Rakers, MD [address] [place], [address] [place] [address] [phone]  Patient Profile: Jessica Yates is a 72 y.o. female with a pmh significant for Diabetes, hypertension, hyperlipidemia, obesity, sickle cell trait, previous VTE, asthma, arthritis, GERD, IBS.  The patient presents to the [facility] for an evaluation and management of problem(s) noted below:  Problem List 1. Chronic cough   2. Gastroesophageal reflux disease, unspecified whether esophagitis present   3. Irritable bowel syndrome, unspecified type    Discussed the use of AI scribe software for clinical note transcription with the patient, who gave verbal consent to proceed.  History of Present Illness Please see prior GI notes for full details of HPI.  Interval History This is the patient's first visit to the [facility] in years, she is accompanied by her sister.  She has previously been followed by Dr. Christella Hartigan.  I take care of her brother, so she wanted to follow-up with me.  She has a history of reported acid reflux for which she has been on PPI therapy for >10 years.  She also takes a H2RA at bedtime.  She does not have overt pyrosis.  She is not sure what would happen if she stopped her PPI therapy (she really doesn't want to find out).  Her main issue at this point, is a chronic cough.  Twice daily Aciphex and Pepcid has not made a difference for the patient.  She feels that her symptom is particularly noticeable in the morning upon waking and intermittently throughout the day.    She reports that certain foods and cold drinks can trigger her cough.  The patient denies experiencing true pyrosis or dysphagia or odynophagia.   The patient has previously seen a pulmonologist for this issue and had been recommended PFTs, but she has not followed up on those being fully completed and not seen Pulmonary in over a year per her report.     She interestingly, does feel that at times, the coughing spells may cause her to have increased fecal urgency.  She denies severe abdominal pain or bloating but rarely will have an occasional upset stomach.  The patient expressed interest in possibly switching her PPI.  She has never had an EGD.   GI Review of Systems Positive as above Negative for dysphagia, melena, hematochezia  Review of Systems General: Denies fevers/chills/weight loss unintentionally Cardiovascular: Denies chest pain Pulmonary: Denies shortness of breath Gastroenterological: See HPI Genitourinary: Denies darkened urine  Hematological: Denies easy bruising/bleeding Dermatological: Denies jaundice Psychological: Mood is stable   Medications Current Outpatient Medications  Medication Sig Dispense Refill   acetaminophen (TYLENOL) 500 MG tablet Take 1,000 mg by mouth every 6 (six) hours as needed for mild pain.      albuterol (VENTOLIN HFA) 108 (90 Base) MCG/ACT inhaler TAKE 2 PUFFS BY MOUTH EVERY 6 HOURS AS NEEDED FOR WHEEZE OR SHORTNESS OF BREATH 18 each 0   aspirin 81 MG chewable tablet Chew 81 mg by mouth at bedtime.     blood glucose meter kit and supplies KIT Dispense based on patient and insurance preference. Use up to four times daily as directed. (FOR ICD-9 250.00, 250.01). 1 each 0   Cholecalciferol (VITAMIN D3) 25 MCG (1000 UT) capsule Take 1,000 Units by mouth daily.     COD LIVER OIL PO Take 1 tablet by mouth daily.     cyanocobalamin (VITAMIN B12) 500 MCG tablet Take 1  tablet (500 mcg total) by mouth daily. 30 tablet 0   dapagliflozin propanediol (FARXIGA) 10 MG TABS tablet Take 10 mg by mouth daily before breakfast.     esomeprazole (NEXIUM) 40 MG capsule Take 1 capsule (40 mg total) by mouth 2 (two) times daily before a meal. 60 capsule 6   famotidine (PEPCID) 40 MG tablet Take 1 tablet (40 mg total) by mouth at bedtime. Please keep appointment scheduled for 08-03-23 for further refills 30 tablet 3    fexofenadine (ALLEGRA) 180 MG tablet Take 180 mg by mouth daily.     fluticasone (FLONASE) 50 MCG/ACT nasal spray Place 2 sprays into both nostrils daily. (Patient taking differently: Place 2 sprays into both nostrils daily as needed for allergies or rhinitis.) 1 g 5   folic acid (FOLVITE) 1 MG tablet Take 1 tablet (1 mg total) by mouth daily. 30 tablet 0   furosemide (LASIX) 40 MG tablet Take 1 tablet (40 mg total) by mouth daily. 90 tablet 3   GEMTESA 75 MG TABS Take 75 mg by mouth daily.     hyoscyamine (LEVSIN) 0.125 MG tablet Take 1 tablet (0.125 mg total) by mouth 2 (two) times daily as needed for cramping (diarrhea,nausea). 15 tablet 0   losartan (COZAAR) 25 MG tablet Take 12.5 mg by mouth See admin instructions. Take 12.5 mg by mouth only when taking Furosemide     Omega-3 Fatty Acids (FISH OIL PO) Take 1 tablet by mouth daily.     OZEMPIC, 1 MG/DOSE, 4 MG/3ML SOPN Inject 1 mg into the skin once a week.     rosuvastatin (CRESTOR) 40 MG tablet Take 1 tablet (40 mg total) by mouth daily. 90 tablet 3   timolol (TIMOPTIC) 0.5 % ophthalmic solution Place 1 drop into both eyes in the morning and at bedtime.     TRAVATAN Z 0.004 % SOLN ophthalmic solution Place 1 drop into both eyes at bedtime.      doxycycline (VIBRAMYCIN) 100 MG capsule Take 1 capsule (100 mg total) by mouth 2 (two) times daily. 20 capsule 0   No current facility-administered medications for this visit.    Allergies No Known Allergies  Histories Past Medical History:  Diagnosis Date   Allergic rhinitis    Anemia 2002   Anxiety    Arthritis    Asthma    Blood clot in vein    Cellulitis    Cellulitis of right lower extremity    Chronic diastolic (congestive) heart failure (HCC)    Chronic kidney disease, stage 3 (HCC)    Coagulation defect (HCC)    Cough    Depression 02/06/2008   Diabetes mellitus, type 2 (HCC)    DM2 (diabetes mellitus, type 2) (HCC) 11/18/2022   DVT (deep venous thrombosis) (HCC) 2002    left leg   Dysphagia    Elevated hemoglobin A1c    GERD (gastroesophageal reflux disease)    Glaucoma    Holosystolic murmur    Hyperlipidemia 01/29/2023   Kidney cysts    Leukocytosis    Liver cyst    Lymphedema    Morbid obesity (HCC)    Overactive bladder    Palpitations    Peripheral venous insufficiency    Primary open angle glaucoma (POAG) of both eyes, mild stage    Seasonal allergies    Sickle cell trait (HCC)    Tortuous aorta (HCC)    Unstable gait    Upper airway cough syndrome    Vitamin D deficiency  Past Surgical History:  Procedure Laterality Date   COSMETIC SURGERY  1975   nasal reconstruction   TOTAL ABDOMINAL HYSTERECTOMY  07/2001   Social History   Socioeconomic History   Marital status: Single    Spouse name: Not on file   Number of children: 0   Years of education: Not on file   Highest education level: Not on file  Occupational History   Occupation: retired from [employer]  Tobacco Use   Smoking status: Never   Smokeless tobacco: Never  Vaping Use   Vaping status: Never Used  Substance and Sexual Activity   Alcohol use: No   Drug use: No   Sexual activity: Never  Other Topics Concern   Not on file  Social History Narrative   Lives with mother and brother         Social Determinants of Health   Financial Resource Strain: Not on file  Food Insecurity: No Food Insecurity (01/29/2023)   Hunger Vital Sign    Worried About Running Out of Food in the Last Year: Never true    Ran Out of Food in the Last Year: Never true  Transportation Needs: No Transportation Needs (01/29/2023)   PRAPARE - Administrator, Civil Service (Medical): No    Lack of Transportation (Non-Medical): No  Physical Activity: Not on file  Stress: Not on file  Social Connections: Not on file  Intimate Partner Violence: Not At Risk (01/29/2023)   Humiliation, Afraid, Rape, and Kick questionnaire    Fear of Current or Ex-Partner: No     Emotionally Abused: No    Physically Abused: No    Sexually Abused: No   Family History  Problem Relation Age of Onset   Allergies Mother    Asthma Mother    Other Mother        enlarged heart   Arthritis Mother    Breast cancer Mother    Diabetes Mother    Hypertension Mother    Cancer Mother        breast   Hyperlipidemia Mother    Cancer Father 70       leukemia   Allergies Sister    Arthritis Sister    Heart disease Maternal Grandmother    Breast cancer Maternal Grandmother    Birth defects Maternal Grandmother        breast   Colon cancer Neg Hx    Esophageal cancer Neg Hx    Inflammatory bowel disease Neg Hx    Liver disease Neg Hx    Pancreatic cancer Neg Hx    Rectal cancer Neg Hx    Stomach cancer Neg Hx    I have reviewed her medical, social, and family history in detail and updated the electronic medical record as necessary.    PHYSICAL EXAMINATION  BP 132/70   Pulse (!) 104   Ht 5\' 7"  (1.702 m)   Wt 201 lb 9.6 oz (91.4 kg)   SpO2 92%   BMI 31.58 kg/m  Wt Readings from Last 3 Encounters:  08/03/23 201 lb 9.6 oz (91.4 kg)  07/12/23 194 lb 9.6 oz (88.3 kg)  06/11/23 188 lb 15 oz (85.7 kg)  GEN: NAD, appears older than stated age, appears chronically ill but is nontoxic, sister with her PSYCH: Cooperative, without pressured speech EYE: Conjunctivae pink, sclerae anicteric ENT: MMM CV: Nontachycardic RESP: No audible wheezing GI: NABS, soft, protuberant abdomen, NT/ND, without rebound or guarding MSK/EXT: Trace bilateral pedal  edema SKIN: No jaundice NEURO:  Alert & Oriented x 3, no focal deficits   REVIEW OF DATA  I reviewed the following data at the time of this encounter:  GI Procedures and Studies  2022 colonoscopy - Hemorrhoids found on digital rectal exam. - The examined portion of the ileum was normal. - Diverticulosis in the recto-sigmoid colon, in the sigmoid colon, in the descending colon, in the transverse colon and at the hepatic  flexure. - Normal mucosa in the entire examined colon. - Non-bleeding non-thrombosed external and internal hemorrhoids.   Laboratory Studies  Reviewed those in epic  Imaging Studies  Barium swallow 2022 IMPRESSION: 1. 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 2. Study was positive for gastroesophageal reflux. 3. Otherwise, structurally normal esophagus.   ASSESSMENT  Ms. Brom is a 72 y.o. female  with a pmh significant for Diabetes, hypertension, hyperlipidemia, obesity, sickle cell trait, previous VTE, asthma, arthritis, GERD, IBS.  The patient is seen today for evaluation and management of:  1. Chronic cough   2. Gastroesophageal reflux disease, unspecified whether esophagitis present   3. Irritable bowel syndrome, unspecified type    The patient is hemodynamically stable.  Clinically however, although she has barium swallow imaging from a few years ago that shows evidence of GERD, her symptomatology of chronic cough is not clearly defined to me that this is the reason for the symptom.  I think that an upper endoscopy is reasonable to evaluate for changes of esophagitis that would give [place] more information to say that she is not adequately treated or she may end up needing pH impedance testing on PPI if we have a normal appearing endoscopy.  Will move forward with scheduling that to help [place] better define the chronic cough.  I do think she needs to follow-up with pulmonary as had been previously discussed.  We are going to transition her PPI to see if this makes a difference in her symptoms.  In regards to her "IBS symptoms" will trial Levsin for now to see if that makes a difference for any of her symptoms if it does then we can give a longer prescription for her.  The risks and benefits of endoscopic evaluation were discussed with the patient; these include but are not limited to the risk of perforation, infection, bleeding, missed lesions, lack of  diagnosis, severe illness requiring hospitalization, as well as anesthesia and sedation related illnesses.  The patient and/or family is agreeable to proceed.  All patient questions were answered to the best of my ability, and the patient agrees to the aforementioned plan of action with follow-up as indicated.   PLAN  Switch to Nexium 40 mg twice daily - Take 30 minutes before meal May continue to use Pepcid if needed Diagnostic endoscopy to be pursued If normal endoscopy without esophagitis, pH impedance testing on PPI will be next step in evaluation Levsin 1-3 times daily as needed for 2 weeks to see if this is helpful for her "IBS symptoms" Colonoscopy recall for surveillance in 2027   No orders of the defined types were placed in this encounter.   Modified Medications   No medications on file    Planned Follow Up No follow-ups on file.   Total Time in Face-to-Face and in Coordination of Care for patient including independent/personal interpretation/review of prior testing, medical history, examination, medication adjustment, communicating results with the patient directly, and documentation within the EHR is 30 minutes.   Corliss Parish, MD [facility] Advanced  Endoscopy Office # [phone]

## 2023-08-03 NOTE — Patient Instructions (Signed)
We have sent the following medications to your pharmacy for you to pick up at your convenience: Nexium, Levsin   Stop Aciphex.   Start Nexium.   If you decide that you would like to have EGD, please contact office and let us know.  _______________________________________________________  If your blood pressure at your visit was 140/90 or greater, please contact your primary care physician to follow up on this.  _______________________________________________________  If you are age 79 or older, your body mass index should be between 23-30. Your Body mass index is 31.58 kg/m. If this is out of the aforementioned range listed, please consider follow up with your Primary Care Provider.  If you are age 45 or younger, your body mass index should be between 19-25. Your Body mass index is 31.58 kg/m. If this is out of the aformentioned range listed, please consider follow up with your Primary Care Provider.   ________________________________________________________  The [facility] providers would like to encourage you to use [facility] to communicate with providers for non-urgent requests or questions.  Due to long hold times on the telephone, sending your provider a message by [facility] may be a faster and more efficient way to get a response.  Please allow 48 business hours for a response.  Please remember that this is for non-urgent requests.  _______________________________________________________  Thank you for choosing me and [facility].  Dr. Meridee Score

## 2023-08-04 ENCOUNTER — Encounter: Payer: Self-pay | Admitting: Gastroenterology

## 2023-08-04 ENCOUNTER — Encounter (HOSPITAL_COMMUNITY): Payer: Self-pay | Admitting: *Deleted

## 2023-08-04 ENCOUNTER — Emergency Department (HOSPITAL_COMMUNITY)
Admission: EM | Admit: 2023-08-04 | Discharge: 2023-08-04 | Disposition: A | Discharge status: Home / Self Care | Payer: Medicare HMO | Attending: Emergency Medicine | Admitting: Emergency Medicine

## 2023-08-04 ENCOUNTER — Emergency Department (HOSPITAL_COMMUNITY): Payer: Medicare HMO

## 2023-08-04 ENCOUNTER — Other Ambulatory Visit: Payer: Self-pay

## 2023-08-04 DIAGNOSIS — N189 Chronic kidney disease, unspecified: Secondary | ICD-10-CM | POA: Diagnosis not present

## 2023-08-04 DIAGNOSIS — M79661 Pain in right lower leg: Secondary | ICD-10-CM | POA: Diagnosis not present

## 2023-08-04 DIAGNOSIS — L818 Other specified disorders of pigmentation: Secondary | ICD-10-CM | POA: Insufficient documentation

## 2023-08-04 DIAGNOSIS — L03119 Cellulitis of unspecified part of limb: Secondary | ICD-10-CM

## 2023-08-04 DIAGNOSIS — Z7984 Long term (current) use of oral hypoglycemic drugs: Secondary | ICD-10-CM | POA: Insufficient documentation

## 2023-08-04 DIAGNOSIS — L03115 Cellulitis of right lower limb: Secondary | ICD-10-CM | POA: Insufficient documentation

## 2023-08-04 DIAGNOSIS — Z794 Long term (current) use of insulin: Secondary | ICD-10-CM | POA: Diagnosis not present

## 2023-08-04 DIAGNOSIS — E1122 Type 2 diabetes mellitus with diabetic chronic kidney disease: Secondary | ICD-10-CM | POA: Diagnosis not present

## 2023-08-04 DIAGNOSIS — R600 Localized edema: Secondary | ICD-10-CM | POA: Diagnosis not present

## 2023-08-04 DIAGNOSIS — M7989 Other specified soft tissue disorders: Secondary | ICD-10-CM

## 2023-08-04 DIAGNOSIS — Z7982 Long term (current) use of aspirin: Secondary | ICD-10-CM | POA: Insufficient documentation

## 2023-08-04 LAB — BASIC METABOLIC PANEL
Anion gap: 6 (ref 5–15)
BUN: 12 mg/dL (ref 8–23)
CO2: 22 mmol/L (ref 22–32)
Calcium: 10 mg/dL (ref 8.9–10.3)
Chloride: 107 mmol/L (ref 98–111)
Creatinine, Ser: 0.77 mg/dL (ref 0.44–1.00)
GFR, Estimated: >60 mL/min (ref >60)
Glucose, Bld: 150 mg/dL — ABNORMAL HIGH (ref 70–99)
Potassium: 3.9 mmol/L (ref 3.5–5.1)
Sodium: 135 mmol/L (ref 135–145)

## 2023-08-04 LAB — CBC WITH DIFFERENTIAL/PLATELET
Abs Immature Granulocytes: 0.05 10*3/uL (ref 0.00–0.07)
Basophils Absolute: 0 10*3/uL (ref 0.0–0.1)
Basophils Relative: 0 %
Eosinophils Absolute: 0 10*3/uL (ref 0.0–0.5)
Eosinophils Relative: 0 %
HCT: 34 % — ABNORMAL LOW (ref 36.0–46.0)
Hemoglobin: 11 g/dL — ABNORMAL LOW (ref 12.0–15.0)
Immature Granulocytes: 0 %
Lymphocytes Relative: 4 %
Lymphs Abs: 0.4 10*3/uL — ABNORMAL LOW (ref 0.7–4.0)
MCH: 26.8 pg (ref 26.0–34.0)
MCHC: 32.4 g/dL (ref 30.0–36.0)
MCV: 82.7 fL (ref 80.0–100.0)
Monocytes Absolute: 0.3 10*3/uL (ref 0.1–1.0)
Monocytes Relative: 3 %
Neutro Abs: 10.5 10*3/uL — ABNORMAL HIGH (ref 1.7–7.7)
Neutrophils Relative %: 93 %
Platelets: 152 10*3/uL (ref 150–400)
RBC: 4.11 MIL/uL (ref 3.87–5.11)
RDW: 17 % — ABNORMAL HIGH (ref 11.5–15.5)
WBC: 11.4 10*3/uL — ABNORMAL HIGH (ref 4.0–10.5)
nRBC: 0 % (ref 0.0–0.2)

## 2023-08-04 MED ORDER — DOXYCYCLINE HYCLATE 100 MG PO TABS
100.0000 mg | ORAL_TABLET | Freq: Once | ORAL | Status: AC
  Administered 2023-08-04: 100 mg via ORAL
  Filled 2023-08-04: qty 1

## 2023-08-04 MED ORDER — DOXYCYCLINE HYCLATE 100 MG PO CAPS: 100.0000 mg | ORAL_CAPSULE | Freq: Two times a day (BID) | ORAL | 0 refills | Status: DC

## 2023-08-04 NOTE — ED Provider Notes (Signed)
7:09 AM Care assumed from Dr. Preston Fleeting.  At time of transfer of care, patient awaiting results of labs and DVT ultrasound.  If workup negative, plan of care to prescribe antibiotics for suspected cellulitis.  1:49 PM Ultrasound did not show DVT.  Per previous plan, will give prescription for antibiotics.  Patient reports Keflex do not work for last time and she agrees with doxycycline.  Will give prescription and a dose here.  Patient was discharged for outpatient follow-up.  Patient agreed and was discharged in good condition.   Clinical Impression: 1. Pain and swelling of right lower leg   2. Cellulitis of lower extremity, unspecified laterality     Disposition: Discharge  Condition: Good  I have discussed the results, Dx and Tx plan with the pt(& family if present). He/she/they expressed understanding and agree(s) with the plan. Discharge instructions discussed at great length. Strict return precautions discussed and pt &/or family have verbalized understanding of the instructions. No further questions at time of discharge.    New Prescriptions   DOXYCYCLINE (VIBRAMYCIN) 100 MG CAPSULE    Take 1 capsule (100 mg total) by mouth 2 (two) times daily.    Follow Up: Renaye Rakers, MD [address] [place], [address] [place] [address] [phone]     [facility] Emergency Department at [facility] [address] [place] [address] [phone]       Kenith Trickel, Canary Brim, MD 08/04/23 1349

## 2023-08-04 NOTE — ED Provider Notes (Signed)
[place] EMERGENCY DEPARTMENT AT [facility] Provider Note   CSN: [id number] Arrival date & time: 08/04/23  0406     History  Chief Complaint  Patient presents with   Leg Pain    Jessica Yates is a 72 y.o. female.  The history is provided by the patient.  Leg Pain She has a history of diabetes, hyperlipidemia, lymphedema, cellulitis, DVT, diastolic heart failure, chronic kidney disease and comes in because of pain and swelling in for right lower leg which started last night.  She denies any trauma or unusual activity.  She did have fever to 102 and took acetaminophen which brought her temperature down but did not help her pain.  She is concerned about possible recurrent cellulitis.  She denies chest pain, dyspnea.   Home Medications Prior to Admission medications   Medication Sig Start Date End Date Taking? Authorizing Provider  acetaminophen (TYLENOL) 500 MG tablet Take 1,000 mg by mouth every 6 (six) hours as needed for mild pain.     [provider]  albuterol (VENTOLIN HFA) 108 (90 Base) MCG/ACT inhaler TAKE 2 PUFFS BY MOUTH EVERY 6 HOURS AS NEEDED FOR WHEEZE OR SHORTNESS OF BREATH 02/14/23   Kalman Shan, MD  aspirin 81 MG chewable tablet Chew 81 mg by mouth at bedtime.    [provider]  blood glucose meter kit and supplies KIT Dispense based on patient and insurance preference. Use up to four times daily as directed. (FOR ICD-9 250.00, 250.01). 05/20/19   Darlin Drop, DO  Cholecalciferol (VITAMIN D3) 25 MCG (1000 UT) capsule Take 1,000 Units by mouth daily.    [provider]  COD LIVER OIL PO Take 1 tablet by mouth daily.    [provider]  cyanocobalamin (VITAMIN B12) 500 MCG tablet Take 1 tablet (500 mcg total) by mouth daily. 02/02/23   Regalado, Belkys A, MD  dapagliflozin propanediol (FARXIGA) 10 MG TABS tablet Take 10 mg by mouth daily before breakfast.    [provider]  esomeprazole (NEXIUM) 40 MG  capsule Take 1 capsule (40 mg total) by mouth 2 (two) times daily before a meal. 08/03/23   Mansouraty, Netty Starring., MD  famotidine (PEPCID) 40 MG tablet Take 1 tablet (40 mg total) by mouth at bedtime. Please keep appointment scheduled for 08-03-23 for further refills 05/04/23   Mansouraty, Netty Starring., MD  fexofenadine (ALLEGRA) 180 MG tablet Take 180 mg by mouth daily. 11/17/22   [provider]  fluticasone (FLONASE) 50 MCG/ACT nasal spray Place 2 sprays into both nostrils daily. Patient taking differently: Place 2 sprays into both nostrils daily as needed for allergies or rhinitis. 07/02/22   Parrett, Virgel Bouquet, NP  folic acid (FOLVITE) 1 MG tablet Take 1 tablet (1 mg total) by mouth daily. 02/01/23   Regalado, Belkys A, MD  furosemide (LASIX) 40 MG tablet Take 1 tablet (40 mg total) by mouth daily. 07/12/23   Nahser, Deloris Ping, MD  GEMTESA 75 MG TABS Take 75 mg by mouth daily.    [provider]  hyoscyamine (LEVSIN) 0.125 MG tablet Take 1 tablet (0.125 mg total) by mouth 2 (two) times daily as needed for cramping (diarrhea,nausea). 08/03/23   Mansouraty, Netty Starring., MD  losartan (COZAAR) 25 MG tablet Take 12.5 mg by mouth See admin instructions. Take 12.5 mg by mouth only when taking Furosemide 05/04/22   [provider]  Omega-3 Fatty Acids (FISH OIL PO) Take 1 tablet by mouth daily.  [provider]  OZEMPIC, 1 MG/DOSE, 4 MG/3ML SOPN Inject 1 mg into the skin once a week. 05/14/23   [provider]  rosuvastatin (CRESTOR) 40 MG tablet Take 1 tablet (40 mg total) by mouth daily. 07/12/23   Nahser, Deloris Ping, MD  timolol (TIMOPTIC) 0.5 % ophthalmic solution Place 1 drop into both eyes in the morning and at bedtime. 07/16/21   [provider]  TRAVATAN Z 0.004 % SOLN ophthalmic solution Place 1 drop into both eyes at bedtime.  03/14/11   [provider]      Allergies    Patient has no known allergies.    Review of Systems   Review of  Systems  All other systems reviewed and are negative.   Physical Exam Updated Vital Signs BP 136/66 (BP Location: Left Arm)   Pulse (!) 104   Temp 98.4 F (36.9 C) (Oral)   Resp 19   SpO2 100%  Physical Exam Vitals and nursing note reviewed.   72 year old female, resting comfortably and in no acute distress. Vital signs are significant for mildly elevated heart rate. Oxygen saturation is 100%, which is normal. Head is normocephalic and atraumatic. PERRLA, EOMI. Oropharynx is clear. Neck is nontender and supple without adenopathy. Lungs are clear without rales, wheezes, or rhonchi. Chest is nontender. Heart has regular rate and rhythm without murmur. Abdomen is soft, flat, nontender. Extremities: 2-3+ lymphedema bilaterally.  Calf circumference is symmetric.  There is tenderness palpation over the right calf, but no palpable cord.  Hyperpigmentation is present more consistent with chronic venous stasis than acute cellulitis. Skin is warm and dry without rash. Neurologic: Mental status is normal, cranial nerves are intact, moves all extremities equally.  ED Results / Procedures / Treatments   Labs (all labs ordered are listed, but only abnormal results are displayed) Labs Reviewed - No data to display  Radiology No results found.  Procedures Procedures    Medications Ordered in ED Medications - No data to display  ED Course/ Medical Decision Making/ A&P                                 Medical Decision Making Amount and/or Complexity of Data Reviewed Labs: ordered.   Right calf pain with history of fever at home concerning for cellulitis.  History also concerning for DVT.  I have ordered venous ultrasound and I have ordered screening labs of CBC and basic metabolic panel.  Case is signed out to Dr. Rush Landmark, oncoming physician.  Final Clinical Impression(s) / ED Diagnoses Final diagnoses:  Pain and swelling of right lower leg    Rx / DC Orders ED Discharge Orders      None         Dione Booze, MD 08/04/23 [phone]

## 2023-08-04 NOTE — Progress Notes (Signed)
Lower extremity venous duplex completed. Please see CV Procedures for preliminary results.  Shona Simpson, RVT 08/04/23 1:29 PM

## 2023-08-04 NOTE — ED Triage Notes (Signed)
Pt ambulatory to triage, endorses lower right leg pain that she noticed tonight. Denies any injury. Pt has compression hose on and says that the swelling in her legs are normal for her.

## 2023-08-04 NOTE — Discharge Instructions (Signed)
Your history, exam, workup today did not show evidence of blood clot but did show evidence of cellulitis.  Please take the antibiotics to treat this and follow-up with your primary doctor.  If any symptoms change or worsen acutely, please return to the nearest emergency department.

## 2023-08-05 ENCOUNTER — Telehealth: Payer: Self-pay | Admitting: Gastroenterology

## 2023-08-05 DIAGNOSIS — Z860101 Personal history of adenomatous and serrated colon polyps: Secondary | ICD-10-CM | POA: Insufficient documentation

## 2023-08-05 DIAGNOSIS — K589 Irritable bowel syndrome without diarrhea: Secondary | ICD-10-CM | POA: Insufficient documentation

## 2023-08-05 NOTE — Telephone Encounter (Signed)
Inbound call from patient stating she has a question regarding Doxycycline. Please advise.

## 2023-08-08 ENCOUNTER — Other Ambulatory Visit (HOSPITAL_COMMUNITY): Payer: Self-pay

## 2023-08-08 NOTE — Telephone Encounter (Signed)
Returned call to patient. Answered questions regarding Doxycycline.

## 2023-08-09 ENCOUNTER — Ambulatory Visit: Payer: Medicare HMO | Admitting: Cardiovascular Disease

## 2023-08-10 DIAGNOSIS — E1169 Type 2 diabetes mellitus with other specified complication: Secondary | ICD-10-CM | POA: Diagnosis not present

## 2023-08-12 ENCOUNTER — Other Ambulatory Visit: Payer: Self-pay | Admitting: Cardiovascular Disease

## 2023-08-17 DIAGNOSIS — E1169 Type 2 diabetes mellitus with other specified complication: Secondary | ICD-10-CM | POA: Diagnosis not present

## 2023-08-24 DIAGNOSIS — E1169 Type 2 diabetes mellitus with other specified complication: Secondary | ICD-10-CM | POA: Diagnosis not present

## 2023-08-25 DIAGNOSIS — Z7985 Long-term (current) use of injectable non-insulin antidiabetic drugs: Secondary | ICD-10-CM | POA: Diagnosis not present

## 2023-08-25 DIAGNOSIS — E1169 Type 2 diabetes mellitus with other specified complication: Secondary | ICD-10-CM | POA: Diagnosis not present

## 2023-08-25 DIAGNOSIS — I890 Lymphedema, not elsewhere classified: Secondary | ICD-10-CM | POA: Diagnosis not present

## 2023-08-25 DIAGNOSIS — I10 Essential (primary) hypertension: Secondary | ICD-10-CM | POA: Diagnosis not present

## 2023-08-25 DIAGNOSIS — E669 Obesity, unspecified: Secondary | ICD-10-CM | POA: Diagnosis not present

## 2023-09-01 DIAGNOSIS — E1169 Type 2 diabetes mellitus with other specified complication: Secondary | ICD-10-CM | POA: Diagnosis not present

## 2023-09-02 ENCOUNTER — Telehealth: Payer: Self-pay | Admitting: Gastroenterology

## 2023-09-02 MED ORDER — ESOMEPRAZOLE MAGNESIUM 40 MG PO CPDR: 40.0000 mg | DELAYED_RELEASE_CAPSULE | Freq: Two times a day (BID) | ORAL | 2 refills | Status: DC

## 2023-09-02 NOTE — Telephone Encounter (Signed)
Refill for Nexium sent to patients pharmacy. Patient was informed.

## 2023-09-02 NOTE — Telephone Encounter (Signed)
Inbound call from patient, states she would like medication for acid reflux called into her pharmacy. Patient is unsure of what medication it is.

## 2023-09-02 NOTE — Telephone Encounter (Signed)
Patient requesting call back in regards to previous note. Please advise.

## 2023-09-05 NOTE — Telephone Encounter (Signed)
PT is returning call to say that Nexium is not working and If omeprazole is stronger. She also wants to discuss having an EGD since nothing is working. Please advise.

## 2023-09-08 NOTE — Telephone Encounter (Signed)
Patient called again to go over message below.

## 2023-09-09 ENCOUNTER — Telehealth: Payer: Self-pay | Admitting: Gastroenterology

## 2023-09-09 DIAGNOSIS — E1169 Type 2 diabetes mellitus with other specified complication: Secondary | ICD-10-CM | POA: Diagnosis not present

## 2023-09-09 DIAGNOSIS — Z7985 Long-term (current) use of injectable non-insulin antidiabetic drugs: Secondary | ICD-10-CM | POA: Diagnosis not present

## 2023-09-09 NOTE — Telephone Encounter (Signed)
Inbound call from patient stating she believes the medication Nexium has not been working. States she still has a cough. Also stated she was previously taking omeprazole medication that seemed to help. Requesting a call back to discuss further. Please advise, thank you.

## 2023-09-09 NOTE — Telephone Encounter (Signed)
Called the patient . No answer

## 2023-09-09 NOTE — Telephone Encounter (Signed)
Called the patient back. No answer.

## 2023-09-12 NOTE — Telephone Encounter (Signed)
Spoke with the patient. Very undecided about changing her medication. She has been on Aciphex before and was not sure she had any improvement. She decided she will take her Nexium as prescribed but take it on an empty stomach at least 30 minutes before her meal. She will give [place] follow up in a week. She had chosen not to schedule the EGD at the time of her office visit. Agrees to schedule now. EGD on 11/25/23. Pre-visit 11/20/23.

## 2023-09-12 NOTE — Telephone Encounter (Signed)
Beth, Switch her to Aciphex 20 mg twice daily. Stop omeprazole or Nexium. May continue using famotidine. I thought we had discussed her being scheduled for an endoscopy, or we waiting for appointments to come available? Thanks. GM

## 2023-09-12 NOTE — Telephone Encounter (Signed)
Patient feels the omeprazole and the Nexium are about the same. She is taking famotidine 40 mg.  "What do you think would be the best medication for me to try now?"

## 2023-09-15 DIAGNOSIS — E1169 Type 2 diabetes mellitus with other specified complication: Secondary | ICD-10-CM | POA: Diagnosis not present

## 2023-09-15 NOTE — Telephone Encounter (Signed)
 Message left for pt to return call is she still has questions.

## 2023-09-15 NOTE — Telephone Encounter (Signed)
 Left message on machine to call back

## 2023-09-15 NOTE — Telephone Encounter (Signed)
 Thank you for update. Lets get a follow-up in approximately 3 to 4 weeks from the patient via telephone messaging. GM

## 2023-09-26 ENCOUNTER — Telehealth: Payer: Self-pay | Admitting: Gastroenterology

## 2023-09-26 NOTE — Telephone Encounter (Signed)
 Patient called and stated that she has been taking NEXIUM  40MG  and it seem like it has not help with her acid reflux. Patient also stated that she use to take Omeprazole  and it seemed like that helped a little more then the NEXIUM . Patient also stated that she was wondering if there was a different medication or can her dosage be increased. Patient is requesting a call back. Please advise.

## 2023-09-27 NOTE — Telephone Encounter (Signed)
 Left message on machine to call back

## 2023-09-28 NOTE — Telephone Encounter (Signed)
 Left message on machine to call back

## 2023-09-29 NOTE — Telephone Encounter (Signed)
Left message on machine to call back   Unable to reach pt by phone will await further communication from the pt

## 2023-09-29 NOTE — Telephone Encounter (Signed)
PT returning call. Please advise.

## 2023-09-30 ENCOUNTER — Other Ambulatory Visit: Payer: Self-pay

## 2023-09-30 MED ORDER — OMEPRAZOLE 40 MG PO CPDR: 40.0000 mg | DELAYED_RELEASE_CAPSULE | Freq: Two times a day (BID) | ORAL | 3 refills | Status: DC

## 2023-09-30 NOTE — Telephone Encounter (Signed)
The pt returned call and asked to be switched back to omeprazole.  She states that Nexium does not seem to be working.  I have sent this to her pharmacy and she will let us know if her symptoms of cough and reflux do not improve.

## 2023-09-30 NOTE — Telephone Encounter (Signed)
Left message on machine to call back  

## 2023-10-07 DIAGNOSIS — E1169 Type 2 diabetes mellitus with other specified complication: Secondary | ICD-10-CM | POA: Diagnosis not present

## 2023-10-11 ENCOUNTER — Ambulatory Visit (INDEPENDENT_AMBULATORY_CARE_PROVIDER_SITE_OTHER): Payer: Medicare PPO | Admitting: Podiatry

## 2023-10-11 ENCOUNTER — Encounter: Payer: Self-pay | Admitting: Podiatry

## 2023-10-11 VITALS — Ht 67.0 in | Wt 201.0 lb

## 2023-10-11 DIAGNOSIS — M79675 Pain in left toe(s): Secondary | ICD-10-CM

## 2023-10-11 DIAGNOSIS — N183 Chronic kidney disease, stage 3 unspecified: Secondary | ICD-10-CM

## 2023-10-11 DIAGNOSIS — E0822 Diabetes mellitus due to underlying condition with diabetic chronic kidney disease: Secondary | ICD-10-CM

## 2023-10-11 DIAGNOSIS — M79674 Pain in right toe(s): Secondary | ICD-10-CM

## 2023-10-11 DIAGNOSIS — Z794 Long term (current) use of insulin: Secondary | ICD-10-CM

## 2023-10-11 DIAGNOSIS — B351 Tinea unguium: Secondary | ICD-10-CM

## 2023-10-11 NOTE — Telephone Encounter (Signed)
Returned call to patient. Advised that Omeprazole 40 mg has been prescribed for 1 tablet by mouth twice daily. Patient verbalized understanding.

## 2023-10-11 NOTE — Telephone Encounter (Signed)
Patient called would like to confirm the directions for the Omeprazole medication.

## 2023-10-13 DIAGNOSIS — E1169 Type 2 diabetes mellitus with other specified complication: Secondary | ICD-10-CM | POA: Diagnosis not present

## 2023-10-13 NOTE — Telephone Encounter (Signed)
Returned call patient again today. She just needed more clarification on when to take Omeprazole and Famotidine. Patient was advised on when to take PPI. Patient voiced understanding.

## 2023-10-13 NOTE — Telephone Encounter (Signed)
Patient called and stated that she would like to speak to nurse patty. Patient stated that she had some question regarding medication she is take herself Omeprazole and Famotidine. Patient is requesting a call back today if possible. Please advise.

## 2023-10-14 ENCOUNTER — Other Ambulatory Visit: Payer: Self-pay | Admitting: Gastroenterology

## 2023-10-14 ENCOUNTER — Encounter: Payer: Self-pay | Admitting: Podiatry

## 2023-10-14 NOTE — Progress Notes (Signed)
  Subjective:  Patient ID: Jessica Yates, female    DOB: 1951/06/28,  MRN: [id number]  73 y.o. female presents with at risk foot care. Pt has h/o NIDDM with chronic kidney disease and painful mycotic toenails x 10 which interfere with daily activities. Pain is relieved with periodic professional debridement. Chief Complaint  Patient presents with   [facility]    She is here for nail trim, PCP is Dr. Parke Simmers, last office visit was 6 weeks ago with pcp, : " I think my A1C was 6.5"     PCP: Renaye Rakers, MD.  New problem(s): None.   Review of Systems: Negative except as noted in the HPI.   No Known Allergies  Objective:  There were no vitals filed for this visit. Constitutional Patient is a pleasant 74 y.o. female obese in NAD. AAO x 3.  Vascular Capillary fill time to digits <3 seconds.  DP/PT pulse(s) are faintly palpable b/l lower extremities. Pedal hair absent b/l. Lower extremity skin temperature gradient warm to cool b/l. No pain with calf compression b/l. No cyanosis or clubbing noted. No ischemia nor gangrene noted b/l. Lymphedema present BLE.  Neurologic Protective sensation intact 5/5 intact bilaterally with 10g monofilament b/l. Vibratory sensation intact b/l. No clonus b/l. Pt has subjective symptoms of neuropathy.  Dermatologic Pedal skin is thin, shiny and atrophic b/l.  No open wounds b/l lower extremities. No interdigital macerations b/l lower extremities. Toenails 1-5 b/l elongated, discolored, dystrophic, thickened, crumbly with subungual debris and tenderness to dorsal palpation. Skin b/l lower extremities noted to be thickened and brawny consistent with lymphedema.  Orthopedic: Normal muscle strength 5/5 to all lower extremity muscle groups bilaterally. Pes planus deformity noted bilateral LE.   Last HgA1c:     Latest Ref Rng & Units 11/17/2022    9:38 PM  Hemoglobin A1C  Hemoglobin-A1c 4.8 - 5.6 % 7.1      Assessment:   1. Pain due to onychomycosis of toenails of both  feet   2. 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 Consent given for treatment as described below: -Continue supportive shoe gear daily. -Toenails 1-5 b/l were debrided in length and girth with sterile nail nippers and dremel without iatrogenic bleeding.  -Patient/POA to call should there be question/concern in the interim.  Return in about 3 months (around 01/09/2024).  [name], DPM      [place] LOCATION: [address], [place] [address]                   Office [phone]   [facility] LOCATION: [address] [place], [place] [address] Office [phone]

## 2023-10-21 DIAGNOSIS — E1169 Type 2 diabetes mellitus with other specified complication: Secondary | ICD-10-CM | POA: Diagnosis not present

## 2023-10-26 DIAGNOSIS — H2513 Age-related nuclear cataract, bilateral: Secondary | ICD-10-CM | POA: Diagnosis not present

## 2023-10-26 DIAGNOSIS — H401133 Primary open-angle glaucoma, bilateral, severe stage: Secondary | ICD-10-CM | POA: Diagnosis not present

## 2023-11-01 DIAGNOSIS — E1169 Type 2 diabetes mellitus with other specified complication: Secondary | ICD-10-CM | POA: Diagnosis not present

## 2023-11-04 DIAGNOSIS — Z1231 Encounter for screening mammogram for malignant neoplasm of breast: Secondary | ICD-10-CM | POA: Diagnosis not present

## 2023-11-07 ENCOUNTER — Other Ambulatory Visit: Payer: Self-pay

## 2023-11-07 MED ORDER — ROSUVASTATIN CALCIUM 40 MG PO TABS: 40.0000 mg | ORAL_TABLET | Freq: Every day | ORAL | 2 refills | Status: DC

## 2023-11-09 DIAGNOSIS — E1169 Type 2 diabetes mellitus with other specified complication: Secondary | ICD-10-CM | POA: Diagnosis not present

## 2023-11-14 ENCOUNTER — Other Ambulatory Visit: Payer: Self-pay | Admitting: Internal Medicine

## 2023-11-14 ENCOUNTER — Ambulatory Visit (AMBULATORY_SURGERY_CENTER): Payer: Medicare HMO

## 2023-11-14 ENCOUNTER — Other Ambulatory Visit: Payer: Self-pay | Admitting: Adult Health

## 2023-11-14 VITALS — Ht 67.0 in | Wt 195.0 lb

## 2023-11-14 DIAGNOSIS — R053 Chronic cough: Secondary | ICD-10-CM

## 2023-11-14 NOTE — Progress Notes (Signed)
 No egg or soy allergy known to patient  No issues known to pt with past sedation with any surgeries or procedures Patient denies ever being told they had issues or difficulty with intubation  No FH of Malignant Hyperthermia Pt is not on diet pills Pt is not on  home 02  Pt is not on blood thinners  Pt denies issues with chronic constipation  No A fib or A flutter Have any cardiac testing pending--no Ambulates with cane

## 2023-11-16 ENCOUNTER — Telehealth: Payer: Self-pay | Admitting: Cardiovascular Disease

## 2023-11-16 MED ORDER — ROSUVASTATIN CALCIUM 40 MG PO TABS: 40.0000 mg | ORAL_TABLET | Freq: Every day | ORAL | 2 refills | Status: AC

## 2023-11-16 MED ORDER — FUROSEMIDE 40 MG PO TABS: 40.0000 mg | ORAL_TABLET | Freq: Every day | ORAL | 2 refills | Status: AC

## 2023-11-16 NOTE — Telephone Encounter (Signed)
 Pt's medication was sent to pt's pharmacy as requested. Confirmation received.

## 2023-11-16 NOTE — Telephone Encounter (Signed)
*  STAT* If patient is at the pharmacy, call can be transferred to refill team.   1. Which medications need to be refilled? (please list name of each medication and dose if known)    rosuvastatin (CRESTOR) 40 MG tablet   furosemide (LASIX) 40 MG tablet   4. Which pharmacy/location (including street and city if local pharmacy) is medication to be sent to?  [id number] [place], [place] - [address] [phone] [address] [place] [address]     5. Do they need a 30 day or 90 day supply? 90

## 2023-11-18 ENCOUNTER — Telehealth: Payer: Self-pay | Admitting: Gastroenterology

## 2023-11-18 ENCOUNTER — Encounter: Payer: Self-pay | Admitting: Gastroenterology

## 2023-11-18 DIAGNOSIS — E1169 Type 2 diabetes mellitus with other specified complication: Secondary | ICD-10-CM | POA: Diagnosis not present

## 2023-11-18 NOTE — Telephone Encounter (Signed)
 Patient called and stated that she is having additional question regarding her prep and prep instruction. Patient is also wanting to know exactly what time her procedure will need to start. Patient is requesting a call back. Please advise.

## 2023-11-18 NOTE — Telephone Encounter (Signed)
 Pt states she did not get a copy of her instructions. RN to send a new copy.to address in profile. Reviewed instructions with pt. Reviewed meds to take and what to hold. Stressed meds to hold. Pt states she has no other question.Marland Kitchen

## 2023-11-23 ENCOUNTER — Telehealth: Payer: Self-pay | Admitting: Gastroenterology

## 2023-11-23 NOTE — Telephone Encounter (Signed)
 Patient called and stated that she is needing for a nurse to call her so she can go over a couple of thing she wants to ask about her prep like what medication she can take and what food she can have. Patient has a procedure for this coming Friday.Patient is requesting for the nurse to return her call today if all possible. Please advise.

## 2023-11-23 NOTE — Telephone Encounter (Signed)
 Spoke with patient and went over prep instructions and patients verbally understood all instructions.

## 2023-11-25 ENCOUNTER — Encounter: Payer: Self-pay | Admitting: Gastroenterology

## 2023-11-25 ENCOUNTER — Ambulatory Visit: Payer: Medicare HMO | Admitting: Gastroenterology

## 2023-11-25 VITALS — BP 106/76 | HR 88 | Temp 97.7°F | Resp 18 | Ht 67.0 in | Wt 195.0 lb

## 2023-11-25 DIAGNOSIS — Z13810 Encounter for screening for upper gastrointestinal disorder: Secondary | ICD-10-CM | POA: Diagnosis not present

## 2023-11-25 DIAGNOSIS — K2100 Gastro-esophageal reflux disease with esophagitis, without bleeding: Secondary | ICD-10-CM

## 2023-11-25 DIAGNOSIS — K317 Polyp of stomach and duodenum: Secondary | ICD-10-CM

## 2023-11-25 DIAGNOSIS — K3189 Other diseases of stomach and duodenum: Secondary | ICD-10-CM

## 2023-11-25 DIAGNOSIS — E119 Type 2 diabetes mellitus without complications: Secondary | ICD-10-CM | POA: Diagnosis not present

## 2023-11-25 DIAGNOSIS — F32A Depression, unspecified: Secondary | ICD-10-CM | POA: Diagnosis not present

## 2023-11-25 DIAGNOSIS — K2289 Other specified disease of esophagus: Secondary | ICD-10-CM

## 2023-11-25 DIAGNOSIS — F419 Anxiety disorder, unspecified: Secondary | ICD-10-CM | POA: Diagnosis not present

## 2023-11-25 DIAGNOSIS — K2950 Unspecified chronic gastritis without bleeding: Secondary | ICD-10-CM | POA: Diagnosis not present

## 2023-11-25 DIAGNOSIS — K229 Disease of esophagus, unspecified: Secondary | ICD-10-CM | POA: Diagnosis not present

## 2023-11-25 DIAGNOSIS — K449 Diaphragmatic hernia without obstruction or gangrene: Secondary | ICD-10-CM | POA: Diagnosis not present

## 2023-11-25 DIAGNOSIS — R053 Chronic cough: Secondary | ICD-10-CM

## 2023-11-25 DIAGNOSIS — K2970 Gastritis, unspecified, without bleeding: Secondary | ICD-10-CM

## 2023-11-25 MED ORDER — SODIUM CHLORIDE 0.9 % IV SOLN: 500.0000 mL | Freq: Once | INTRAVENOUS | Status: DC

## 2023-11-25 NOTE — Patient Instructions (Signed)
 Resume previous diet Continue present medications Await pathology results  Handouts/information given for gastric polyps and Hiatal hernia  YOU HAD AN ENDOSCOPIC PROCEDURE TODAY AT THE [facility]:   Refer to the procedure report that was given to you for any specific questions about what was found during the examination.  If the procedure report does not answer your questions, please call your gastroenterologist to clarify.  If you requested that your care partner not be given the details of your procedure findings, then the procedure report has been included in a sealed envelope for you to review at your convenience later.  YOU SHOULD EXPECT: Some feelings of bloating in the abdomen. Passage of more gas than usual.  Walking can help get rid of the air that was put into your GI tract during the procedure and reduce the bloating. If you had a lower endoscopy (such as a colonoscopy or flexible sigmoidoscopy) you may notice spotting of blood in your stool or on the toilet paper. If you underwent a bowel prep for your procedure, you may not have a normal bowel movement for a few days.  Please Note:  You might notice some irritation and congestion in your nose or some drainage.  This is from the oxygen used during your procedure.  There is no need for concern and it should clear up in a day or so.  SYMPTOMS TO REPORT IMMEDIATELY:  Following upper endoscopy (EGD)  Vomiting of blood or coffee ground material  New chest pain or pain under the shoulder blades  Painful or persistently difficult swallowing  New shortness of breath  Fever of 100F or higher  Black, tarry-looking stools  For urgent or emergent issues, a gastroenterologist can be reached at any hour by calling [phone]. Do not use MyChart messaging for urgent concerns.   DIET:  We do recommend a small meal at first, but then you may proceed to your regular diet.  Drink plenty of fluids but you should avoid alcoholic  beverages for 24 hours.  ACTIVITY:  You should plan to take it easy for the rest of today and you should NOT DRIVE or use heavy machinery until tomorrow (because of the sedation medicines used during the test).    FOLLOW UP: Our staff will call the number listed on your records the next business day following your procedure.  We will call around 7:15- 8:00 am to check on you and address any questions or concerns that you may have regarding the information given to you following your procedure. If we do not reach you, we will leave a message.     If any biopsies were taken you will be contacted by phone or by letter within the next 1-3 weeks.  Please call us at [phone] if you have not heard about the biopsies in 3 weeks.    SIGNATURES/CONFIDENTIALITY: You and/or your care partner have signed paperwork which will be entered into your electronic medical record.  These signatures attest to the fact that that the information above on your After Visit Summary has been reviewed and is understood.  Full responsibility of the confidentiality of this discharge information lies with you and/or your care-partner.

## 2023-11-25 NOTE — Op Note (Signed)
  [facility] Patient Name: Jessica Yates Procedure Date: 11/25/2023 4:12 PM MRN: [id number] Endoscopist: Corliss Parish , MD, [id number] Age: 73 Referring MD:  Date of Birth: [birth year] Gender: Female Account #: [id number] Procedure:                Upper GI endoscopy Indications:              Heartburn, Chronic cough Medicines:                Monitored Anesthesia Care Procedure:                Pre-Anesthesia Assessment:                           - Prior to the procedure, a History and Physical                            was performed, and patient medications and                            allergies were reviewed. The patient's tolerance of                            previous anesthesia was also reviewed. The risks                            and benefits of the procedure and the sedation                            options and risks were discussed with the patient.                            All questions were answered, and informed consent                            was obtained. Prior Anticoagulants: The patient has                            taken no anticoagulant or antiplatelet agents                            except for aspirin. ASA Grade Assessment: III - A                            patient with severe systemic disease. After                            reviewing the risks and benefits, the patient was                            deemed in satisfactory condition to undergo the                            procedure.  After obtaining informed consent, the endoscope was                            passed under direct vision. Throughout the                            procedure, the patient's blood pressure, pulse, and                            oxygen saturations were monitored continuously. The                            GIF [id number] #[id number] was introduced through the                            mouth, and advanced to the second part of duodenum.                             The upper GI endoscopy was accomplished without                            difficulty. The patient tolerated the procedure. Scope In: Scope Out: Findings:                 No gross lesions were noted in the entire                            esophagus. Biopsies were taken with a cold forceps                            for histology.                           The Z-line was irregular and was found 38 cm from                            the incisors.                           A 2 cm hiatal hernia was present.                           Multiple small semi-sessile polyps were found in                            the gastric fundus and in the gastric body (fundic                            gland in appearance). Biopsies were taken with a                            cold forceps for histology.                           Patchy mildly erythematous  mucosa without bleeding                            was found in the entire examined stomach. Biopsies                            were taken with a cold forceps for histology and                            Helicobacter pylori testing.                           No gross lesions were noted in the duodenal bulb,                            in the first portion of the duodenum and in the                            second portion of the duodenum. Complications:            No immediate complications. Estimated Blood Loss:     Estimated blood loss was minimal. Impression:               - No gross lesions in the entire esophagus.                            Biopsied.                           - Z-line irregular, 38 cm from the incisors.                           - 2 cm hiatal hernia.                           - Multiple gastric polyps - fundic gland in                            appearance. Biopsied.                           - Erythematous mucosa in the stomach. Biopsied.                           - No gross lesions in the duodenal bulb,  in the                            first portion of the duodenum and in the second                            portion of the duodenum. Recommendation:           - The patient will be observed post-procedure,                            until all discharge criteria  are met.                           - Discharge patient to home.                           - Patient has a contact number available for                            emergencies. The signs and symptoms of potential                            delayed complications were discussed with the                            patient. Return to normal activities tomorrow.                            Written discharge instructions were provided to the                            patient.                           - Resume previous diet.                           - Continue present medications.                           - Await pathology results.                           - Pending results of the pathology, if no evidence                            of eosinophilic esophagitis is found, then we will                            likely recommend pH impedance testing as it is not                            clear that she has overt acid related reflux                            currently using her symptomatology although her                            hiatal hernia could be playing some role with                            persisting symptoms or she may have nonerosive                            reflux disease or esophageal hypersensitivity.                           -  The findings and recommendations were discussed                            with the patient.                           - The findings and recommendations were discussed                            with the patient's family. Corliss Parish, MD 11/25/2023 4:36:44 PM

## 2023-11-25 NOTE — Progress Notes (Signed)
 Vss nad trans to pacu

## 2023-11-25 NOTE — Progress Notes (Signed)
 Pt's states no medical or surgical changes since previsit or office visit.

## 2023-11-25 NOTE — Progress Notes (Signed)
 GASTROENTEROLOGY PROCEDURE H&P NOTE   Primary Care Physician: Renaye Rakers, MD  HPI: Jessica Yates is a 73 y.o. female who presents for EGD for evaluation of chronic cough, GERD, Barrett's esophagus screening.  Past Medical History:  Diagnosis Date   Allergic rhinitis    Anemia 2002   Anxiety    Arthritis    Asthma    Blood clot in vein    Cellulitis    Cellulitis of right lower extremity    Chronic diastolic (congestive) heart failure (HCC)    Chronic kidney disease, stage 3 (HCC)    Coagulation defect (HCC)    Cough    Depression 02/06/2008   Diabetes mellitus, type 2 (HCC)    DM2 (diabetes mellitus, type 2) (HCC) 11/18/2022   DVT (deep venous thrombosis) (HCC) 2002   left leg   Dysphagia    Elevated hemoglobin A1c    GERD (gastroesophageal reflux disease)    Glaucoma    Holosystolic murmur    Hyperlipidemia 01/29/2023   Kidney cysts    Leukocytosis    Liver cyst    Lymphedema    Morbid obesity (HCC)    Overactive bladder    Palpitations    Peripheral venous insufficiency    Primary open angle glaucoma (POAG) of both eyes, mild stage    Seasonal allergies    Sickle cell trait (HCC)    Tortuous aorta (HCC)    Unstable gait    Upper airway cough syndrome    Vitamin D deficiency    Past Surgical History:  Procedure Laterality Date   COLONOSCOPY     COSMETIC SURGERY  09/13/1973   nasal reconstruction   TOTAL ABDOMINAL HYSTERECTOMY  07/14/2001   Current Outpatient Medications  Medication Sig Dispense Refill   acetaminophen (TYLENOL) 650 MG CR tablet Take 650 mg by mouth every 8 (eight) hours as needed for pain.     albuterol (VENTOLIN HFA) 108 (90 Base) MCG/ACT inhaler TAKE 2 PUFFS BY MOUTH EVERY 6 HOURS AS NEEDED FOR WHEEZE OR SHORTNESS OF BREATH 18 each 0   aspirin 81 MG chewable tablet Chew 81 mg by mouth at bedtime.     blood glucose meter kit and supplies KIT Dispense based on patient and insurance preference. Use up to four times daily as  directed. (FOR ICD-9 250.00, 250.01). 1 each 0   Cholecalciferol (VITAMIN D3) 25 MCG (1000 UT) capsule Take 1,000 Units by mouth daily.     COD LIVER OIL PO Take 1 tablet by mouth daily.     Continuous Glucose Receiver (FREESTYLE LIBRE 3 READER) DEVI USE TO READ CONTINOUS GLUCOSE MONITOR DAILY     cyanocobalamin (VITAMIN B12) 500 MCG tablet Take 1 tablet (500 mcg total) by mouth daily. 30 tablet 0   dapagliflozin propanediol (FARXIGA) 10 MG TABS tablet Take 10 mg by mouth daily before breakfast.     famotidine (PEPCID) 40 MG tablet TAKE 1 TABLET BY MOUTH AT BEDTIME KEEP APPT ON 08/03/23 FOR REFILLS 90 tablet 1   fexofenadine (ALLEGRA) 180 MG tablet Take 180 mg by mouth daily as needed.     fluticasone (FLONASE) 50 MCG/ACT nasal spray Place 2 sprays into both nostrils daily. (Patient taking differently: Place 2 sprays into both nostrils daily as needed for allergies or rhinitis.) 1 g 5   folic acid (FOLVITE) 1 MG tablet Take 1 tablet (1 mg total) by mouth daily. 30 tablet 0   furosemide (LASIX) 40 MG tablet Take 1 tablet (40 mg total)  by mouth daily. 90 tablet 2   GEMTESA 75 MG TABS Take 75 mg by mouth daily. (Patient not taking: Reported on 11/14/2023)     hyoscyamine (LEVSIN) 0.125 MG tablet Take 1 tablet (0.125 mg total) by mouth 2 (two) times daily as needed for cramping (diarrhea,nausea). (Patient not taking: Reported on 11/14/2023) 15 tablet 0   losartan (COZAAR) 25 MG tablet Take 12.5 mg by mouth See admin instructions. Take 12.5 mg by mouth only when taking Furosemide     Omega-3 Fatty Acids (FISH OIL PO) Take 1 tablet by mouth daily.     omeprazole (PRILOSEC) 40 MG capsule Take 1 capsule (40 mg total) by mouth in the morning and at bedtime. 180 capsule 3   OZEMPIC, 1 MG/DOSE, 4 MG/3ML SOPN Inject 1 mg into the skin once a week.     rosuvastatin (CRESTOR) 40 MG tablet Take 1 tablet (40 mg total) by mouth daily. 90 tablet 2   timolol (TIMOPTIC) 0.5 % ophthalmic solution Place 1 drop into both  eyes in the morning and at bedtime.     TRAVATAN Z 0.004 % SOLN ophthalmic solution Place 1 drop into both eyes at bedtime.      No current facility-administered medications for this visit.    Current Outpatient Medications:    acetaminophen (TYLENOL) 650 MG CR tablet, Take 650 mg by mouth every 8 (eight) hours as needed for pain., Disp: , Rfl:    albuterol (VENTOLIN HFA) 108 (90 Base) MCG/ACT inhaler, TAKE 2 PUFFS BY MOUTH EVERY 6 HOURS AS NEEDED FOR WHEEZE OR SHORTNESS OF BREATH, Disp: 18 each, Rfl: 0   aspirin 81 MG chewable tablet, Chew 81 mg by mouth at bedtime., Disp: , Rfl:    blood glucose meter kit and supplies KIT, Dispense based on patient and insurance preference. Use up to four times daily as directed. (FOR ICD-9 250.00, 250.01)., Disp: 1 each, Rfl: 0   Cholecalciferol (VITAMIN D3) 25 MCG (1000 UT) capsule, Take 1,000 Units by mouth daily., Disp: , Rfl:    COD LIVER OIL PO, Take 1 tablet by mouth daily., Disp: , Rfl:    Continuous Glucose Receiver (FREESTYLE LIBRE 3 READER) DEVI, USE TO READ CONTINOUS GLUCOSE MONITOR DAILY, Disp: , Rfl:    cyanocobalamin (VITAMIN B12) 500 MCG tablet, Take 1 tablet (500 mcg total) by mouth daily., Disp: 30 tablet, Rfl: 0   dapagliflozin propanediol (FARXIGA) 10 MG TABS tablet, Take 10 mg by mouth daily before breakfast., Disp: , Rfl:    famotidine (PEPCID) 40 MG tablet, TAKE 1 TABLET BY MOUTH AT BEDTIME KEEP APPT ON 08/03/23 FOR REFILLS, Disp: 90 tablet, Rfl: 1   fexofenadine (ALLEGRA) 180 MG tablet, Take 180 mg by mouth daily as needed., Disp: , Rfl:    fluticasone (FLONASE) 50 MCG/ACT nasal spray, Place 2 sprays into both nostrils daily. (Patient taking differently: Place 2 sprays into both nostrils daily as needed for allergies or rhinitis.), Disp: 1 g, Rfl: 5   folic acid (FOLVITE) 1 MG tablet, Take 1 tablet (1 mg total) by mouth daily., Disp: 30 tablet, Rfl: 0   furosemide (LASIX) 40 MG tablet, Take 1 tablet (40 mg total) by mouth daily., Disp:  90 tablet, Rfl: 2   GEMTESA 75 MG TABS, Take 75 mg by mouth daily. (Patient not taking: Reported on 11/14/2023), Disp: , Rfl:    hyoscyamine (LEVSIN) 0.125 MG tablet, Take 1 tablet (0.125 mg total) by mouth 2 (two) times daily as needed for cramping (diarrhea,nausea). (Patient  not taking: Reported on 11/14/2023), Disp: 15 tablet, Rfl: 0   losartan (COZAAR) 25 MG tablet, Take 12.5 mg by mouth See admin instructions. Take 12.5 mg by mouth only when taking Furosemide, Disp: , Rfl:    Omega-3 Fatty Acids (FISH OIL PO), Take 1 tablet by mouth daily., Disp: , Rfl:    omeprazole (PRILOSEC) 40 MG capsule, Take 1 capsule (40 mg total) by mouth in the morning and at bedtime., Disp: 180 capsule, Rfl: 3   OZEMPIC, 1 MG/DOSE, 4 MG/3ML SOPN, Inject 1 mg into the skin once a week., Disp: , Rfl:    rosuvastatin (CRESTOR) 40 MG tablet, Take 1 tablet (40 mg total) by mouth daily., Disp: 90 tablet, Rfl: 2   timolol (TIMOPTIC) 0.5 % ophthalmic solution, Place 1 drop into both eyes in the morning and at bedtime., Disp: , Rfl:    TRAVATAN Z 0.004 % SOLN ophthalmic solution, Place 1 drop into both eyes at bedtime. , Disp: , Rfl:  Allergies  Allergen Reactions   Grass Pollen(K-O-R-T-Swt Vern) Other (See Comments)    Sneezing, mucous    Family History  Problem Relation Age of Onset   Allergies Mother    Asthma Mother    Other Mother        enlarged heart   Arthritis Mother    Breast cancer Mother    Diabetes Mother    Hypertension Mother    Cancer Mother        breast   Hyperlipidemia Mother    Cancer Father 83       leukemia   Colon polyps Sister    Allergies Sister    Arthritis Sister    Heart disease Maternal Grandmother    Breast cancer Maternal Grandmother    Birth defects Maternal Grandmother        breast   Colon cancer Neg Hx    Esophageal cancer Neg Hx    Inflammatory bowel disease Neg Hx    Liver disease Neg Hx    Pancreatic cancer Neg Hx    Rectal cancer Neg Hx    Stomach cancer Neg Hx     Social History   Socioeconomic History   Marital status: Single    Spouse name: Not on file   Number of children: 0   Years of education: Not on file   Highest education level: Not on file  Occupational History   Occupation: retired from [employer]  Tobacco Use   Smoking status: Never   Smokeless tobacco: Never  Vaping Use   Vaping status: Never Used  Substance and Sexual Activity   Alcohol use: No   Drug use: No   Sexual activity: Never  Other Topics Concern   Not on file  Social History Narrative   Lives with mother and brother         Social Drivers of Corporate investment banker Strain: Not on file  Food Insecurity: No Food Insecurity (01/29/2023)   Hunger Vital Sign    Worried About Running Out of Food in the Last Year: Never true    Ran Out of Food in the Last Year: Never true  Transportation Needs: No Transportation Needs (01/29/2023)   PRAPARE - Administrator, Civil Service (Medical): No    Lack of Transportation (Non-Medical): No  Physical Activity: Not on file  Stress: Not on file  Social Connections: Not on file  Intimate Partner Violence: Not At Risk (01/29/2023)   Humiliation, Afraid,  Rape, and Kick questionnaire    Fear of Current or Ex-Partner: No    Emotionally Abused: No    Physically Abused: No    Sexually Abused: No    Physical Exam: There were no vitals filed for this visit. There is no height or weight on file to calculate BMI. GEN: NAD EYE: Sclerae anicteric ENT: MMM CV: Non-tachycardic GI: Soft, NT/ND NEURO:  Alert & Oriented x 3  Lab Results: No results for input(s): "WBC", "HGB", "HCT", "PLT" in the last 72 hours. BMET No results for input(s): "NA", "K", "CL", "CO2", "GLUCOSE", "BUN", "CREATININE", "CALCIUM" in the last 72 hours. LFT No results for input(s): "PROT", "ALBUMIN", "AST", "ALT", "ALKPHOS", "BILITOT", "BILIDIR", "IBILI" in the last 72 hours. PT/INR No results for input(s): "LABPROT",  "INR" in the last 72 hours.   Impression / Plan: This is a 73 y.o.female who presents for EGD for evaluation of chronic cough, GERD, Barrett's esophagus screening.  The risks and benefits of endoscopic evaluation/treatment were discussed with the patient and/or family; these include but are not limited to the risk of perforation, infection, bleeding, missed lesions, lack of diagnosis, severe illness requiring hospitalization, as well as anesthesia and sedation related illnesses.  The patient's history has been reviewed, patient examined, no change in status, and deemed stable for procedure.  The patient and/or family is agreeable to proceed.    Corliss Parish, MD [facility] Office # [phone]

## 2023-11-28 ENCOUNTER — Telehealth: Payer: Self-pay | Admitting: *Deleted

## 2023-11-28 NOTE — Telephone Encounter (Signed)
  Follow up Call-     11/25/2023    3:44 PM 03/24/2021    2:36 PM  Call back number  Post procedure Call Back phone  # [phone]  Permission to leave phone message Yes Yes     Patient questions:  Do you have a fever, pain , or abdominal swelling? No. Pain Score  0 *  Have you tolerated food without any problems? Yes.    Have you been able to return to your normal activities? Yes.    Do you have any questions about your discharge instructions: Diet   No. Medications  No. Follow up visit  No.  Do you have questions or concerns about your Care? No.  Actions: * If pain score is 4 or above: No action needed, pain <4.

## 2023-11-30 ENCOUNTER — Encounter: Payer: Self-pay | Admitting: Gastroenterology

## 2023-11-30 LAB — SURGICAL PATHOLOGY

## 2023-12-06 DIAGNOSIS — I10 Essential (primary) hypertension: Secondary | ICD-10-CM | POA: Diagnosis not present

## 2023-12-06 DIAGNOSIS — R635 Abnormal weight gain: Secondary | ICD-10-CM | POA: Diagnosis not present

## 2023-12-06 DIAGNOSIS — I890 Lymphedema, not elsewhere classified: Secondary | ICD-10-CM | POA: Diagnosis not present

## 2023-12-06 DIAGNOSIS — E1169 Type 2 diabetes mellitus with other specified complication: Secondary | ICD-10-CM | POA: Diagnosis not present

## 2023-12-16 ENCOUNTER — Telehealth: Payer: Self-pay | Admitting: Gastroenterology

## 2023-12-16 NOTE — Telephone Encounter (Signed)
 Phone will not ring.  Tried to return the call x3. No ring tone is heard as if the line is dead. Results are not reviewed yet.

## 2023-12-16 NOTE — Telephone Encounter (Signed)
 Calling in regards to results

## 2023-12-19 NOTE — Telephone Encounter (Signed)
 Left message on machine to call back

## 2023-12-20 ENCOUNTER — Encounter: Payer: Self-pay | Admitting: Gastroenterology

## 2023-12-20 NOTE — Telephone Encounter (Signed)
 Left message on machine to call back

## 2023-12-21 ENCOUNTER — Other Ambulatory Visit: Payer: Self-pay | Admitting: Internal Medicine

## 2023-12-21 ENCOUNTER — Other Ambulatory Visit: Payer: Self-pay | Admitting: Gastroenterology

## 2023-12-21 NOTE — Telephone Encounter (Signed)
Unable to reach pt by phone will await further communication from the pt  

## 2023-12-22 NOTE — Telephone Encounter (Signed)
 Patient returning call. Please advise on mobil phone

## 2023-12-22 NOTE — Telephone Encounter (Signed)
 Left message on machine to call back

## 2023-12-23 DIAGNOSIS — E1169 Type 2 diabetes mellitus with other specified complication: Secondary | ICD-10-CM | POA: Diagnosis not present

## 2023-12-23 NOTE — Telephone Encounter (Signed)
 Attempted to reach pt all numbers available.  Line rings then stops no voice mail  Will await further communication from the pt

## 2023-12-28 NOTE — Telephone Encounter (Signed)
Patient returning phone call. Requesting a call back. Please advise, thank you.

## 2023-12-29 DIAGNOSIS — E1169 Type 2 diabetes mellitus with other specified complication: Secondary | ICD-10-CM | POA: Diagnosis not present

## 2023-12-29 NOTE — Telephone Encounter (Signed)
 I was able to speak with the pt and answered her questions. She would like another copy mailed to her home. She lost the original. I have mailed that today.

## 2023-12-29 NOTE — Telephone Encounter (Signed)
Attempted to reach pt line busy.

## 2024-01-04 ENCOUNTER — Telehealth: Payer: Self-pay | Admitting: Gastroenterology

## 2024-01-04 NOTE — Telephone Encounter (Signed)
 Line busy

## 2024-01-04 NOTE — Telephone Encounter (Signed)
 Patient called and stated that she would like to speak to [facility] regarding her Omeprazole . Patient stated that was was wanting to know if she can have something a little stronger. Patient is also wanting to speak to patty regarding the copy of her results that was mailed to her. Patient is requesting a call back today if possible. Please advise.

## 2024-01-05 NOTE — Telephone Encounter (Signed)
Line rings no answer no voice mail  °

## 2024-01-05 NOTE — Telephone Encounter (Signed)
 Patient returning call. Please advise

## 2024-01-06 NOTE — Telephone Encounter (Signed)
 Spoke with the pt and discussed her request for a sooner appt.  I did make her aware that no sooner appt available. She is on the wait list and will be called if we have a cancellation.

## 2024-01-06 NOTE — Telephone Encounter (Signed)
Line rings no answer no voice mail  °

## 2024-01-26 DIAGNOSIS — E1169 Type 2 diabetes mellitus with other specified complication: Secondary | ICD-10-CM | POA: Diagnosis not present

## 2024-02-01 ENCOUNTER — Ambulatory Visit: Payer: Medicare PPO | Admitting: Podiatry

## 2024-02-01 ENCOUNTER — Encounter: Payer: Self-pay | Admitting: Podiatry

## 2024-02-01 DIAGNOSIS — E0822 Diabetes mellitus due to underlying condition with diabetic chronic kidney disease: Secondary | ICD-10-CM

## 2024-02-01 DIAGNOSIS — B351 Tinea unguium: Secondary | ICD-10-CM | POA: Diagnosis not present

## 2024-02-01 DIAGNOSIS — M2142 Flat foot [pes planus] (acquired), left foot: Secondary | ICD-10-CM | POA: Diagnosis not present

## 2024-02-01 DIAGNOSIS — M2011 Hallux valgus (acquired), right foot: Secondary | ICD-10-CM

## 2024-02-01 DIAGNOSIS — M79674 Pain in right toe(s): Secondary | ICD-10-CM | POA: Diagnosis not present

## 2024-02-01 DIAGNOSIS — N183 Chronic kidney disease, stage 3 unspecified: Secondary | ICD-10-CM

## 2024-02-01 DIAGNOSIS — M2141 Flat foot [pes planus] (acquired), right foot: Secondary | ICD-10-CM

## 2024-02-01 DIAGNOSIS — M79675 Pain in left toe(s): Secondary | ICD-10-CM | POA: Diagnosis not present

## 2024-02-01 DIAGNOSIS — M2012 Hallux valgus (acquired), left foot: Secondary | ICD-10-CM | POA: Diagnosis not present

## 2024-02-01 DIAGNOSIS — E119 Type 2 diabetes mellitus without complications: Secondary | ICD-10-CM

## 2024-02-01 DIAGNOSIS — Z794 Long term (current) use of insulin: Secondary | ICD-10-CM

## 2024-02-06 NOTE — Progress Notes (Signed)
 ANNUAL DIABETIC FOOT EXAM  Subjective: Jessica Yates presents today for annual diabetic foot exam.  Chief Complaint  Patient presents with   Nail Problem    rfc   Patient confirms h/o diabetes.  Patient denies any h/o foot wounds.  She has h/o lymphedema, CVI and multiple episodes of LE cellulitis.  Jonathon Neighbors, MD is patient's PCP. LOV 11/18/2023.  Past Medical History:  Diagnosis Date   Allergic rhinitis    Anemia 2002   Anxiety    Arthritis    Asthma    Blood clot in vein    Cellulitis    Cellulitis of right lower extremity    Chronic diastolic (congestive) heart failure (HCC)    Chronic kidney disease, stage 3 (HCC)    Coagulation defect (HCC)    Cough    Depression 02/06/2008   Diabetes mellitus, type 2 (HCC)    DM2 (diabetes mellitus, type 2) (HCC) 11/18/2022   DVT (deep venous thrombosis) (HCC) 2002   left leg   Dysphagia    Elevated hemoglobin A1c    GERD (gastroesophageal reflux disease)    Glaucoma    Holosystolic murmur    Hyperlipidemia 01/29/2023   Kidney cysts    Leukocytosis    Liver cyst    Lymphedema    Morbid obesity (HCC)    Overactive bladder    Palpitations    Peripheral venous insufficiency    Primary open angle glaucoma (POAG) of both eyes, mild stage    Seasonal allergies    Sickle cell trait (HCC)    Tortuous aorta (HCC)    Unstable gait    Upper airway cough syndrome    Vitamin D deficiency    Patient Active Problem List   Diagnosis Date Noted   Hx of adenomatous colonic polyps 08/05/2023   Irritable bowel syndrome 08/05/2023   Hypokalemia 01/29/2023   Type 2 diabetes mellitus with hyperglycemia (HCC) 01/29/2023   Hyperlipidemia 01/29/2023   AKI (acute kidney injury) (HCC) 12/29/2022   CKD stage 3a, GFR 45-59 ml/min (HCC) 12/29/2022   Sepsis (HCC) 11/18/2022   Chronic diastolic CHF (congestive heart failure) (HCC) 11/18/2022   DM2 (diabetes mellitus, type 2) (HCC) 11/18/2022   Diabetes mellitus due to underlying  condition with stage 3 chronic kidney disease, with long-term current use of insulin  (HCC) 11/09/2022   Pain due to onychomycosis of toenails of both feet 11/09/2022   Dental caries 07/02/2022   Osteoarthritis of left knee 04/25/2020   Cellulitis 05/13/2019   Cellulitis of right lower extremity 05/12/2019   Normochromic normocytic anemia 05/12/2019   Cellulitis, leg 05/12/2019   Impacted cerumen of left ear 02/22/2019   GERD (gastroesophageal reflux disease) 02/22/2019   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   Laryngopharyngeal reflux (LPR) 02/22/2019   Pain in left knee 11/13/2018   Pain in right knee 11/13/2018   Cellulitis and abscess of left leg 07/22/2018   Primary open angle glaucoma of both eyes, mild stage 05/08/2018   Cough 05/07/2016   Upper airway cough syndrome 05/07/2016   Sebaceous cyst of labia 04/19/2016   OAB (overactive bladder) 04/19/2016   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   Lymphedema 05/03/2012   CHF (congestive heart failure) (HCC) 04/16/2011   SICKLE-CELL TRAIT 08/01/2007   UNSPECIFIED GLAUCOMA 08/01/2007   DVT 08/01/2007   VENOUS INSUFFICIENCY 08/01/2007   Allergic rhinitis 08/01/2007   Extrinsic asthma 08/01/2007   Past Surgical History:  Procedure Laterality Date   COLONOSCOPY  COSMETIC SURGERY  09/13/1973   nasal reconstruction   TOTAL ABDOMINAL HYSTERECTOMY  07/14/2001   Current Outpatient Medications on File Prior to Visit  Medication Sig Dispense Refill   acetaminophen  (TYLENOL ) 650 MG CR tablet Take 650 mg by mouth every 8 (eight) hours as needed for pain.     albuterol  (VENTOLIN  HFA) 108 (90 Base) MCG/ACT inhaler TAKE 2 PUFFS BY MOUTH EVERY 6 HOURS AS NEEDED FOR WHEEZE OR SHORTNESS OF BREATH 18 each 0   aspirin  81 MG chewable tablet Chew 81 mg by mouth at bedtime.     blood glucose meter kit and supplies KIT Dispense based on patient and insurance preference. Use up to four times daily as directed. (FOR ICD-9  250.00, 250.01). 1 each 0   Cholecalciferol (VITAMIN D3) 25 MCG (1000 UT) capsule Take 1,000 Units by mouth daily.     COD LIVER OIL PO Take 1 tablet by mouth daily.     Continuous Glucose Receiver (FREESTYLE LIBRE 3 READER) DEVI USE TO READ CONTINOUS GLUCOSE MONITOR DAILY     cyanocobalamin  (VITAMIN B12) 500 MCG tablet Take 1 tablet (500 mcg total) by mouth daily. 30 tablet 0   dapagliflozin propanediol (FARXIGA) 10 MG TABS tablet Take 10 mg by mouth daily before breakfast.     famotidine  (PEPCID ) 40 MG tablet TAKE 1 TABLET BY MOUTH AT BEDTIME KEEP APPT ON 08/03/23 FOR REFILLS 90 tablet 1   fexofenadine (ALLEGRA) 180 MG tablet Take 180 mg by mouth daily as needed.     fluticasone  (FLONASE ) 50 MCG/ACT nasal spray Place 2 sprays into both nostrils daily. (Patient taking differently: Place 2 sprays into both nostrils daily as needed for allergies or rhinitis.) 1 g 5   folic acid  (FOLVITE ) 1 MG tablet Take 1 tablet (1 mg total) by mouth daily. 30 tablet 0   furosemide  (LASIX ) 40 MG tablet Take 1 tablet (40 mg total) by mouth daily. 90 tablet 2   GEMTESA 75 MG TABS Take 75 mg by mouth daily. (Patient not taking: Reported on 11/14/2023)     hyoscyamine  (LEVSIN) 0.125 MG tablet Take 1 tablet (0.125 mg total) by mouth 2 (two) times daily as needed for cramping (diarrhea,nausea). (Patient not taking: Reported on 11/25/2023) 15 tablet 0   losartan (COZAAR) 25 MG tablet Take 12.5 mg by mouth See admin instructions. Take 12.5 mg by mouth only when taking Furosemide      Omega-3 Fatty Acids (FISH OIL PO) Take 1 tablet by mouth daily.     omeprazole  (PRILOSEC) 40 MG capsule Take 1 capsule (40 mg total) by mouth in the morning and at bedtime. 180 capsule 3   OZEMPIC, 1 MG/DOSE, 4 MG/3ML SOPN Inject 1 mg into the skin once a week.     rosuvastatin  (CRESTOR ) 40 MG tablet Take 1 tablet (40 mg total) by mouth daily. 90 tablet 2   timolol  (TIMOPTIC ) 0.5 % ophthalmic solution Place 1 drop into both eyes in the morning and  at bedtime.     TRAVATAN Z 0.004 % SOLN ophthalmic solution Place 1 drop into both eyes at bedtime.      No current facility-administered medications on file prior to visit.    Allergies  Allergen Reactions   Grass Pollen(K-O-R-T-Swt Vern) Other (See Comments)    Sneezing, mucous    Social History   Occupational History   Occupation: retired from [employer]  Tobacco Use   Smoking status: Never   Smokeless tobacco: Never  Vaping Use  Vaping status: Never Used  Substance and Sexual Activity   Alcohol use: No   Drug use: No   Sexual activity: Never   Family History  Problem Relation Age of Onset   Allergies Mother    Asthma Mother    Other Mother        enlarged heart   Arthritis Mother    Breast cancer Mother    Diabetes Mother    Hypertension Mother    Cancer Mother        breast   Hyperlipidemia Mother    Cancer Father 42       leukemia   Colon polyps Sister    Allergies Sister    Arthritis Sister    Heart disease Maternal Grandmother    Breast cancer Maternal Grandmother    Birth defects Maternal Grandmother        breast   Colon cancer Neg Hx    Esophageal cancer Neg Hx    Inflammatory bowel disease Neg Hx    Liver disease Neg Hx    Pancreatic cancer Neg Hx    Rectal cancer Neg Hx    Stomach cancer Neg Hx    Immunization History  Administered Date(s) Administered   Fluad Quad(high Dose 65+) 07/02/2022   Influenza Split 07/15/2015   Influenza Whole 10/14/2010   PFIZER(Purple Top)SARS-COV-2 Vaccination 10/04/2019, 10/25/2019     Review of Systems: Negative except as noted in the HPI.   Objective: There were no vitals filed for this visit.  Jessica Yates is a pleasant 73 y.o. female in NAD. AAO X 3.  Diabetic foot exam was performed with the following findings:   Vascular Examination: CFT <3 seconds b/l. DP/PT pulses faintly palpable b/l. Skin temperature gradient warm to warm b/l. No pain with calf compression. No  ischemia or gangrene. No cyanosis or clubbing noted b/l. Evidence of skin changes consistent with long term venous stasis BLE. Lymphedema noted LLE.   Neurological Examination: Sensation grossly intact b/l with 10 gram monofilament. Vibratory sensation intact b/l. Pt has subjective symptoms of neuropathy.  Dermatological Examination: No open wounds. No interdigital macerations.  Toenails 1-5 b/l thick, discolored, elongated with subungual debris and pain on dorsal palpation.    Skin b/l lower extremities noted to be thickened and brawny consistent with lymphedema.  Musculoskeletal Examination: Muscle strength 5/5 to all lower extremity muscle groups bilaterally. HAV with bunion b/l. Pes planus deformity noted bilateral LE. Utilizes cane for ambulation assistance.  Radiographs: None     Lab Results  Component Value Date   HGBA1C 7.1 (H) 11/17/2022   ADA Risk Categorization: Low Risk :  Patient has all of the following: Intact protective sensation No prior foot ulcer  No severe deformity Pedal pulses present  Assessment: 1. Pain due to onychomycosis of toenails of both feet   2. Hallux valgus, acquired, bilateral   3. Pes planus of both feet   4. Diabetes mellitus due to underlying condition with stage 3 chronic kidney disease, with long-term current use of insulin , unspecified whether stage 3a or 3b CKD (HCC)   5. Encounter for diabetic foot exam [facility])     Plan: Consent given for treatment. Patient examined. All patient's and/or POA's questions/concerns addressed on today's visit. Mycotic toenails 1-5 debrided in length and girth without incident. Continue foot and shoe inspections daily. Monitor blood glucose per PCP/Endocrinologist's recommendations.Continue soft, supportive shoe gear daily. Report any pedal injuries to medical professional. Call office if there are any quesitons/concerns. -Patient/POA to call  should there be question/concern in the interim. Return in about 3  months (around 05/03/2024).  Jessica Yates, DPM      [place] LOCATION: [address], [place] [address]                   Office [phone]   [facility] LOCATION: [address] [place], [place] [address] Office [phone]

## 2024-02-10 ENCOUNTER — Telehealth: Payer: Self-pay

## 2024-02-10 NOTE — Progress Notes (Signed)
   02/10/2024  Patient ID: Jessica Yates, female   DOB: 1951-07-08, 73 y.o.   MRN: [id number]   Contacted patient regarding medication adherence from a quality report for [facility]. The patient is at critical risk of failing MAD.     Per DrFirst fill history: Rosuvastatin  40 mg - last filled 01/09/24 for a 90-day supply.  Farxiga 10 mg- last filled 01/24/24 for a 30-day supply.  Ozempic 1 mg - last filled 01/30/24 for a 28-day supply.    Called patient to discuss medications and assess for PAP. No answer, left HIPAA compliant voicemail.      Thank you for allowing pharmacy to be a part of this patient's care.     Livia Riffle, PharmD Clinical Pharmacist  [phone]

## 2024-02-15 DIAGNOSIS — E1169 Type 2 diabetes mellitus with other specified complication: Secondary | ICD-10-CM | POA: Diagnosis not present

## 2024-02-23 DIAGNOSIS — E6609 Other obesity due to excess calories: Secondary | ICD-10-CM | POA: Diagnosis not present

## 2024-02-23 DIAGNOSIS — I890 Lymphedema, not elsewhere classified: Secondary | ICD-10-CM | POA: Diagnosis not present

## 2024-02-23 DIAGNOSIS — Z7985 Long-term (current) use of injectable non-insulin antidiabetic drugs: Secondary | ICD-10-CM | POA: Diagnosis not present

## 2024-02-23 DIAGNOSIS — E669 Obesity, unspecified: Secondary | ICD-10-CM | POA: Diagnosis not present

## 2024-02-23 DIAGNOSIS — E785 Hyperlipidemia, unspecified: Secondary | ICD-10-CM | POA: Diagnosis not present

## 2024-02-23 DIAGNOSIS — K2100 Gastro-esophageal reflux disease with esophagitis, without bleeding: Secondary | ICD-10-CM | POA: Diagnosis not present

## 2024-02-23 DIAGNOSIS — E1169 Type 2 diabetes mellitus with other specified complication: Secondary | ICD-10-CM | POA: Diagnosis not present

## 2024-02-23 DIAGNOSIS — E782 Mixed hyperlipidemia: Secondary | ICD-10-CM | POA: Diagnosis not present

## 2024-02-23 DIAGNOSIS — I10 Essential (primary) hypertension: Secondary | ICD-10-CM | POA: Diagnosis not present

## 2024-02-24 ENCOUNTER — Ambulatory Visit (INDEPENDENT_AMBULATORY_CARE_PROVIDER_SITE_OTHER): Admitting: Gastroenterology

## 2024-02-24 ENCOUNTER — Encounter: Payer: Self-pay | Admitting: Gastroenterology

## 2024-02-24 VITALS — BP 124/72 | HR 75 | Ht 67.0 in | Wt 209.0 lb

## 2024-02-24 DIAGNOSIS — R1013 Epigastric pain: Secondary | ICD-10-CM | POA: Diagnosis not present

## 2024-02-24 DIAGNOSIS — R053 Chronic cough: Secondary | ICD-10-CM

## 2024-02-24 DIAGNOSIS — K219 Gastro-esophageal reflux disease without esophagitis: Secondary | ICD-10-CM

## 2024-02-24 DIAGNOSIS — R059 Cough, unspecified: Secondary | ICD-10-CM

## 2024-02-24 DIAGNOSIS — R12 Heartburn: Secondary | ICD-10-CM

## 2024-02-24 MED ORDER — VOQUEZNA 10 MG PO TABS: 10.0000 mg | ORAL_TABLET | Freq: Every day | ORAL | 2 refills | Status: DC

## 2024-02-24 NOTE — Patient Instructions (Signed)
 We have provided you with Voquezna 10 mg. Take one tablet daily and hold Omeprazole .  _______________________________________________________  If your blood pressure at your visit was 140/90 or greater, please contact your primary care physician to follow up on this.  _______________________________________________________  If you are age 73 or older, your body mass index should be between 23-30. Your Body mass index is 32.73 kg/m. If this is out of the aforementioned range listed, please consider follow up with your Primary Care Provider.  If you are age 43 or younger, your body mass index should be between 19-25. Your Body mass index is 32.73 kg/m. If this is out of the aformentioned range listed, please consider follow up with your Primary Care Provider.   ________________________________________________________  The [facility] providers would like to encourage you to use MYCHART to communicate with providers for non-urgent requests or questions.  Due to long hold times on the telephone, sending your provider a message by [facility] may be a faster and more efficient way to get a response.  Please allow 48 business hours for a response.  Please remember that this is for non-urgent requests.  _______________________________________________________   It was a pleasure to see you today!  Thank you for trusting me with your gastrointestinal care!

## 2024-02-24 NOTE — Progress Notes (Unsigned)
 GASTROENTEROLOGY OUTPATIENT CLINIC VISIT   Primary Care Provider Jonathon Neighbors, MD [address] [place], [address] [place] [address] [phone]  Patient Profile: Jessica Yates is a 73 y.o. female with a pmh significant for Diabetes, hypertension, hyperlipidemia, obesity, sickle cell trait, previous VTE, asthma, arthritis, GERD, IBS.  The patient presents to the [facility] for an evaluation and management of problem(s) noted below:  Problem List No diagnosis found.  Discussed the use of AI scribe software for clinical note transcription with the patient, who gave verbal consent to proceed.  History of Present Illness Please see prior GI notes for full details of HPI.  Interval History     GI Review of Systems Positive as above Negative for dysphagia, melena, hematochezia  Review of Systems General: Denies fevers/chills/weight loss unintentionally Cardiovascular: Denies chest pain Pulmonary: Denies shortness of breath Gastroenterological: See HPI Genitourinary: Denies darkened urine  Hematological: Denies easy bruising/bleeding Dermatological: Denies jaundice Psychological: Mood is stable   Medications Current Outpatient Medications  Medication Sig Dispense Refill   acetaminophen  (TYLENOL ) 650 MG CR tablet Take 650 mg by mouth every 8 (eight) hours as needed for pain.     albuterol  (VENTOLIN  HFA) 108 (90 Base) MCG/ACT inhaler TAKE 2 PUFFS BY MOUTH EVERY 6 HOURS AS NEEDED FOR WHEEZE OR SHORTNESS OF BREATH 18 each 0   aspirin  81 MG chewable tablet Chew 81 mg by mouth at bedtime.     blood glucose meter kit and supplies KIT Dispense based on patient and insurance preference. Use up to four times daily as directed. (FOR ICD-9 250.00, 250.01). 1 each 0   Cholecalciferol (VITAMIN D3) 25 MCG (1000 UT) capsule Take 1,000 Units by mouth daily.     COD LIVER OIL PO Take 1 tablet by mouth daily.     Continuous Glucose Receiver (FREESTYLE LIBRE 3 READER) DEVI USE TO  READ CONTINOUS GLUCOSE MONITOR DAILY     cyanocobalamin  (VITAMIN B12) 500 MCG tablet Take 1 tablet (500 mcg total) by mouth daily. 30 tablet 0   dapagliflozin propanediol (FARXIGA) 10 MG TABS tablet Take 10 mg by mouth daily before breakfast.     famotidine  (PEPCID ) 40 MG tablet TAKE 1 TABLET BY MOUTH AT BEDTIME KEEP APPT ON 08/03/23 FOR REFILLS 90 tablet 1   fexofenadine (ALLEGRA) 180 MG tablet Take 180 mg by mouth daily as needed.     fluticasone  (FLONASE ) 50 MCG/ACT nasal spray Place 2 sprays into both nostrils daily. (Patient taking differently: Place 2 sprays into both nostrils daily as needed for allergies or rhinitis.) 1 g 5   folic acid  (FOLVITE ) 1 MG tablet Take 1 tablet (1 mg total) by mouth daily. 30 tablet 0   furosemide  (LASIX ) 40 MG tablet Take 1 tablet (40 mg total) by mouth daily. 90 tablet 2   GEMTESA 75 MG TABS Take 75 mg by mouth daily. (Patient not taking: Reported on 11/14/2023)     hyoscyamine  (LEVSIN ) 0.125 MG tablet Take 1 tablet (0.125 mg total) by mouth 2 (two) times daily as needed for cramping (diarrhea,nausea). (Patient not taking: Reported on 11/25/2023) 15 tablet 0   losartan (COZAAR) 25 MG tablet Take 12.5 mg by mouth See admin instructions. Take 12.5 mg by mouth only when taking Furosemide      Omega-3 Fatty Acids (FISH OIL PO) Take 1 tablet by mouth daily.     omeprazole  (PRILOSEC) 40 MG capsule Take 1 capsule (40 mg total) by mouth in the morning and at bedtime. 180 capsule 3  OZEMPIC, 1 MG/DOSE, 4 MG/3ML SOPN Inject 1 mg into the skin once a week.     rosuvastatin  (CRESTOR ) 40 MG tablet Take 1 tablet (40 mg total) by mouth daily. 90 tablet 2   timolol  (TIMOPTIC ) 0.5 % ophthalmic solution Place 1 drop into both eyes in the morning and at bedtime.     TRAVATAN Z 0.004 % SOLN ophthalmic solution Place 1 drop into both eyes at bedtime.      No current facility-administered medications for this visit.    Allergies Allergies  Allergen Reactions   Grass  Pollen(K-O-R-T-Swt Vern) Other (See Comments)    Sneezing, mucous     Histories Past Medical History:  Diagnosis Date   Allergic rhinitis    Anemia 2002   Anxiety    Arthritis    Asthma    Blood clot in vein    Cellulitis    Cellulitis of right lower extremity    Chronic diastolic (congestive) heart failure (HCC)    Chronic kidney disease, stage 3 (HCC)    Coagulation defect (HCC)    Cough    Depression 02/06/2008   Diabetes mellitus, type 2 (HCC)    DM2 (diabetes mellitus, type 2) (HCC) 11/18/2022   DVT (deep venous thrombosis) (HCC) 2002   left leg   Dysphagia    Elevated hemoglobin A1c    GERD (gastroesophageal reflux disease)    Glaucoma    Holosystolic murmur    Hyperlipidemia 01/29/2023   Kidney cysts    Leukocytosis    Liver cyst    Lymphedema    Morbid obesity (HCC)    Overactive bladder    Palpitations    Peripheral venous insufficiency    Primary open angle glaucoma (POAG) of both eyes, mild stage    Seasonal allergies    Sickle cell trait (HCC)    Tortuous aorta (HCC)    Unstable gait    Upper airway cough syndrome    Vitamin D deficiency    Past Surgical History:  Procedure Laterality Date   COLONOSCOPY     COSMETIC SURGERY  09/13/1973   nasal reconstruction   TOTAL ABDOMINAL HYSTERECTOMY  07/14/2001   Social History   Socioeconomic History   Marital status: Single    Spouse name: Not on file   Number of children: 0   Years of education: Not on file   Highest education level: Not on file  Occupational History   Occupation: retired from [employer]  Tobacco Use   Smoking status: Never   Smokeless tobacco: Never  Vaping Use   Vaping status: Never Used  Substance and Sexual Activity   Alcohol use: No   Drug use: No   Sexual activity: Never  Other Topics Concern   Not on file  Social History Narrative   Lives with mother and brother         Social Drivers of Corporate investment banker Strain: Not on file   Food Insecurity: No Food Insecurity (01/29/2023)   Hunger Vital Sign    Worried About Running Out of Food in the Last Year: Never true    Ran Out of Food in the Last Year: Never true  Transportation Needs: No Transportation Needs (01/29/2023)   PRAPARE - Administrator, Civil Service (Medical): No    Lack of Transportation (Non-Medical): No  Physical Activity: Not on file  Stress: Not on file  Social Connections: Not on file  Intimate Partner Violence: Not At Risk (  01/29/2023)   Humiliation, Afraid, Rape, and Kick questionnaire    Fear of Current or Ex-Partner: No    Emotionally Abused: No    Physically Abused: No    Sexually Abused: No   Family History  Problem Relation Age of Onset   Allergies Mother    Asthma Mother    Other Mother        enlarged heart   Arthritis Mother    Breast cancer Mother    Diabetes Mother    Hypertension Mother    Cancer Mother        breast   Hyperlipidemia Mother    Cancer Father 76       leukemia   Colon polyps Sister    Allergies Sister    Arthritis Sister    Heart disease Maternal Grandmother    Breast cancer Maternal Grandmother    Birth defects Maternal Grandmother        breast   Colon cancer Neg Hx    Esophageal cancer Neg Hx    Inflammatory bowel disease Neg Hx    Liver disease Neg Hx    Pancreatic cancer Neg Hx    Rectal cancer Neg Hx    Stomach cancer Neg Hx    I have reviewed her medical, social, and family history in detail and updated the electronic medical record as necessary.    PHYSICAL EXAMINATION  There were no vitals taken for this visit. Wt Readings from Last 3 Encounters:  11/25/23 195 lb (88.5 kg)  11/14/23 195 lb (88.5 kg)  10/11/23 201 lb (91.2 kg)  GEN: NAD, appears older than stated age, appears chronically ill but is nontoxic, sister with her PSYCH: Cooperative, without pressured speech EYE: Conjunctivae pink, sclerae anicteric ENT: MMM CV: Nontachycardic RESP: No audible wheezing GI:  NABS, soft, protuberant abdomen, NT/ND, without rebound or guarding MSK/EXT: Trace bilateral pedal edema SKIN: No jaundice NEURO:  Alert & Oriented x 3, no focal deficits   REVIEW OF DATA  I reviewed the following data at the time of this encounter:  GI Procedures and Studies  3/25 EGD - No gross lesions in the entire esophagus. Biopsied. - Z-line irregular, 38 cm from the incisors. - 2 cm hiatal hernia. - Multiple gastric polyps - fundic gland in appearance. Biopsied. - Erythematous mucosa in the stomach. Biopsied. - No gross lesions in the duodenal bulb, in the first portion of the duodenum and in the second portion of the duodenum.  Pathology  FINAL DIAGNOSIS      1. Surgical [P], gastric random :      REACTIVE GASTROPATHY WITH MILD CHRONIC GASTRITIS      NEGATIVE FOR H. PYLORI, INTESTINAL METAPLASIA, DYSPLASIA AND CARCINOMA       2. Surgical [P], gastric polyps :      MINIMAL CHRONIC GASTRITIS WITH LYMPHOID AGGREGATES AND REACTIVE EPITHELIAL      CHANGES      FOCAL CHANGES COMPATIBLE WITH EARLY FUNDIC GLAND POLYP      NEGATIVE FOR H. PYLORI, INTESTINAL METAPLASIA, DYSPLASIA AND CARCINOMA       3. Surgical [P], random esophagus :      REACTIVE SQUAMOUS MUCOSA WITH CHANGES SUGGESTIVE OF REFLUX ESOPHAGITIS      NEGATIVE FOR GLANDULAR EPITHELIUM, EOSINOPHILS, DYSPLASIA AND CARCINOMA   Laboratory Studies  Reviewed those in epic  Imaging Studies  Barium swallow 2022 IMPRESSION: 1. 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 2. Study was positive for gastroesophageal reflux. 3. Otherwise, structurally normal esophagus.  ASSESSMENT  Ms. Starkel is a 73 y.o. female  with a pmh significant for Diabetes, hypertension, hyperlipidemia, obesity, sickle cell trait, previous VTE, asthma, arthritis, GERD, IBS.  The patient is seen today for evaluation and management of:  No diagnosis found.  The patient is hemodynamically stable.  Clinically  however, although she has barium swallow imaging from a few years ago that shows evidence of GERD, her symptomatology of chronic cough is not clearly defined to me that this is the reason for the symptom.  I think that an upper endoscopy is reasonable to evaluate for changes of esophagitis that would give us  more information to say that she is not adequately treated or she may end up needing pH impedance testing on PPI if we have a normal appearing endoscopy.  Will move forward with scheduling that to help us  better define the chronic cough.  I do think she needs to follow-up with pulmonary as had been previously discussed.  We are going to transition her PPI to see if this makes a difference in her symptoms.  In regards to her IBS symptoms will trial Levsin  for now to see if that makes a difference for any of her symptoms if it does then we can give a longer prescription for her.  The risks and benefits of endoscopic evaluation were discussed with the patient; these include but are not limited to the risk of perforation, infection, bleeding, missed lesions, lack of diagnosis, severe illness requiring hospitalization, as well as anesthesia and sedation related illnesses.  The patient and/or family is agreeable to proceed.  All patient questions were answered to the best of my ability, and the patient agrees to the aforementioned plan of action with follow-up as indicated.   PLAN  Switch to Nexium  40 mg twice daily - Take 30 minutes before meal May continue to use Pepcid  if needed Diagnostic endoscopy to be pursued If normal endoscopy without esophagitis, pH impedance testing on PPI will be next step in evaluation Levsin  1-3 times daily as needed for 2 weeks to see if this is helpful for her IBS symptoms Colonoscopy recall for surveillance in 2027   No orders of the defined types were placed in this encounter.   Modified Medications   No medications on file    Planned Follow Up No follow-ups  on file.   Total Time in Face-to-Face and in Coordination of Care for patient including independent/personal interpretation/review of prior testing, medical history, examination, medication adjustment, communicating results with the patient directly, and documentation within the EHR is 30 minutes.   Yong Henle, MD [facility] Office # [phone]

## 2024-02-24 NOTE — Progress Notes (Signed)
   02/24/2024  Patient ID: Jessica Yates, female   DOB: 1951-07-08, 73 y.o.   MRN: [id number]   Contacted patient regarding medication adherence from a quality report for [facility]. The patient is at critical risk of failing MAD.     Called patient to discuss medications and assess for PAP. No answer, left HIPAA compliant voicemail.      Thank you for allowing pharmacy to be a part of this patient's care.     Livia Riffle, PharmD Clinical Pharmacist  [phone]

## 2024-02-25 ENCOUNTER — Encounter: Payer: Self-pay | Admitting: Gastroenterology

## 2024-02-25 DIAGNOSIS — R12 Heartburn: Secondary | ICD-10-CM | POA: Insufficient documentation

## 2024-02-28 DIAGNOSIS — H2513 Age-related nuclear cataract, bilateral: Secondary | ICD-10-CM | POA: Diagnosis not present

## 2024-02-28 DIAGNOSIS — H401132 Primary open-angle glaucoma, bilateral, moderate stage: Secondary | ICD-10-CM | POA: Diagnosis not present

## 2024-03-06 ENCOUNTER — Other Ambulatory Visit (HOSPITAL_COMMUNITY): Payer: Self-pay

## 2024-03-06 ENCOUNTER — Telehealth: Payer: Self-pay

## 2024-03-06 NOTE — Telephone Encounter (Signed)
 Completed patient assistance applications for Farziga (AZ&ME) and Ozempic (Novo Nordisk) and faxed to Dr. Kennieth Blase office and [facility] to have patient and provider review and sign and upcoming appointment

## 2024-03-06 NOTE — Progress Notes (Addendum)
   03/06/2024  Patient ID: Jessica Yates, female   DOB: [birth year], 73 y.o.   MRN: [id number]    2025 Medication Assistance Application Summary:  Patient was outreached regarding medication assistance renewal for 2025. Verified address, anticipated insurance for 2025, and income has not changed. Patient remains interested in PAP for 2025 for Farxiga and Ozempic, no other new medications were identified for medication assistance.    Medication Assistance Findings:  Medication assistance needs identified: Farxiga 10 mg daily, Ozempic 1 mg weekly     Additional medication assistance options reviewed with patient as warranted:  No other options identified  Plan: I will route patient assistance letter to pharmacy technician who will coordinate patient assistance program application process for medications listed above.  Pharmacy technician will assist with obtaining all required documents from both patient and provider(s) and submit application(s) once completed.    Thank you for allowing pharmacy to be a part of this patient's care.   Heather Factor, PharmD Clinical Pharmacist  [phone]

## 2024-03-09 DIAGNOSIS — E1169 Type 2 diabetes mellitus with other specified complication: Secondary | ICD-10-CM | POA: Diagnosis not present

## 2024-03-12 DIAGNOSIS — E1169 Type 2 diabetes mellitus with other specified complication: Secondary | ICD-10-CM | POA: Diagnosis not present

## 2024-03-13 ENCOUNTER — Telehealth: Payer: Self-pay

## 2024-03-13 ENCOUNTER — Other Ambulatory Visit (HOSPITAL_COMMUNITY): Payer: Self-pay

## 2024-03-13 ENCOUNTER — Telehealth: Payer: Self-pay | Admitting: Gastroenterology

## 2024-03-13 NOTE — Telephone Encounter (Signed)
 PA request has been Submitted. New Encounter has been or will be created for follow up. For additional info see Pharmacy Prior Auth telephone encounter from 03-13-2024.

## 2024-03-13 NOTE — Telephone Encounter (Signed)
 Inbound call from patient, states Voquenza needs an authorization and she states she is almost our of her samples, would like request submitted and medication filled.

## 2024-03-13 NOTE — Telephone Encounter (Signed)
 Pharmacy Patient Advocate Encounter   Received notification from Pt Calls Messages that prior authorization for Voquezna  10MG  tablets is required/requested.   Insurance verification completed.   The patient is insured through [employer] [place] .   Per test claim: PA required; PA submitted to above mentioned insurance via CoverMyMeds Key/confirmation #/[id number] Status is pending

## 2024-03-14 NOTE — Telephone Encounter (Signed)
 Patient called and stated that she is inquiring about what she needs to do about having her medication about Voquenza. Patient is requesting a call back. Please advise.

## 2024-03-14 NOTE — Telephone Encounter (Signed)
 The pt has been advised that we have submitted the request for approval and will call her as soon as we get a response.

## 2024-03-14 NOTE — Telephone Encounter (Signed)
 Pharmacy Patient Advocate Encounter  Received notification from [employer] that Prior Authorization for Voquezna  10MG  tablets has been DENIED.  Full denial letter will be uploaded to the media tab. See denial reason below.  The drug you asked for is not listed in your preferred drug list (formulary). The preferred drug(s), you may not have tried, are:   Dexlansoprazole capsule biphase delayed release Lansoprazole capsule delayed release  PA #/Case ID/Reference #: [id number]

## 2024-03-20 ENCOUNTER — Other Ambulatory Visit: Payer: Self-pay

## 2024-03-20 MED ORDER — DEXLANSOPRAZOLE 30 MG PO CPDR: 30.0000 mg | DELAYED_RELEASE_CAPSULE | Freq: Two times a day (BID) | ORAL | 0 refills | Status: DC

## 2024-03-20 NOTE — Telephone Encounter (Signed)
 If we can get Voquenza eventually, then let patient know we need to get her through Dexilant  and Lansoprazole  as noted below. Offer her Dexilant  30 mg BID (60/0).   If no improvement in 1 month then will plan trial of Lansoprazole  20 mg BID.   If no improvement then will need Voquenza. Thanks. GM

## 2024-03-20 NOTE — Telephone Encounter (Addendum)
 Patient called back. States she spoke with her pharmacy and the Dexilant  would cost $160.00 for a 1 month supply. Will try to send rx for Lansoprazole . Discussed with patient. She verbalized understanding.  While attempting to order Lansoprazole  20mg  per MD recommendation, no orders came up. Only able to order as 15 mg or 30 mg. Will route to provider to review.

## 2024-03-20 NOTE — Telephone Encounter (Signed)
 Patient notified about medication change. Dexilant  sent to pharmacy of choice.

## 2024-03-21 ENCOUNTER — Other Ambulatory Visit: Payer: Self-pay | Admitting: Gastroenterology

## 2024-03-22 ENCOUNTER — Other Ambulatory Visit: Payer: Self-pay

## 2024-03-22 MED ORDER — LANSOPRAZOLE 30 MG PO CPDR: 30.0000 mg | DELAYED_RELEASE_CAPSULE | Freq: Every day | ORAL | 1 refills | Status: DC

## 2024-03-22 NOTE — Telephone Encounter (Signed)
 Lansoprazole  ordered. Had already discussed with patient to follow up on her symptoms in a month. Will call patient at that time to discuss symptoms.

## 2024-03-22 NOTE — Telephone Encounter (Signed)
 Lansoprazole  30 mg daily (30/1). Patient to give us  update after 1 month. If not effective we will try to get prior authorization for Voquenza at that time. Thanks. GM

## 2024-03-23 DIAGNOSIS — E1169 Type 2 diabetes mellitus with other specified complication: Secondary | ICD-10-CM | POA: Diagnosis not present

## 2024-03-23 NOTE — Telephone Encounter (Signed)
 Brandy with [employer] is calling to update that the lansoprazole  has been denied because it is not in the formulary. The only medication that would suffice is pantoprazole . It needs to be sent in with a PA. Please advise.

## 2024-03-23 NOTE — Telephone Encounter (Signed)
 Per denial letter for Voquezna  the two recommended PPIs were Dexilant  and Lansoprazole . This RN has attempted to order both of these medications, and both have been denied by insurance. Routing to the pharmacy team to see if they can provide any insight into this.

## 2024-03-26 ENCOUNTER — Other Ambulatory Visit (HOSPITAL_COMMUNITY): Payer: Self-pay

## 2024-03-26 NOTE — Telephone Encounter (Signed)
 PA is not needed for either Dexlansoprazole  or Lansoprazole . Test billing with [facility] returns the results below. I called the pharmacy to give them the override code (TD) needed for them to process, but had to leave a message on the voicemail.  No PA is needed.

## 2024-03-26 NOTE — Telephone Encounter (Signed)
 Patient requesting f/u call in regards to previous medication . Please advise   Thank you

## 2024-03-27 NOTE — Telephone Encounter (Signed)
 Spoke with patient, she has multiple concerns regarding the side effects that she read on the package for the Lansoprazole . She voiced concerns about the mention that it could potentially cause increased skin conditions and she already has a history of lymphedema to bilateral lower extremities. She also voiced concerns about it causing problems to her kidneys with her stage 3 chronic kidney disease. Patient also stated concern that a possible side effect was Lupus. Advised patient that these side effects are very rare, and discussed most common side effects. Patient would like to know if there is another medication that she could try. She mentioned several times about Pantoprazole  and wanted to know if that was an option, as this is the medication that the patient's insurance had recommended to her. Advised patient that Pantoprazole  is in the same class of drugs as Lansoprazole  and would share similar common and rare side effects. Patient would like providers input and recommendations before she takes the medication.

## 2024-03-27 NOTE — Telephone Encounter (Signed)
 Called and spoke with patient. She had multiple questions about Lansoprazole  dosage and side effects. All questions answered. Patient verbalized understanding. Patient verbalized understanding and is aware to reach out to us  with an update in 1 month.

## 2024-03-27 NOTE — Telephone Encounter (Signed)
 Attempted to reach patient. No answer, left VM for patient to return call.

## 2024-03-27 NOTE — Telephone Encounter (Signed)
 Patient calling requesting to speak with nurse. States the question is ''kinda complicated please advise.

## 2024-03-28 ENCOUNTER — Ambulatory Visit: Admitting: Gastroenterology

## 2024-03-29 ENCOUNTER — Other Ambulatory Visit: Payer: Self-pay

## 2024-03-29 MED ORDER — PANTOPRAZOLE SODIUM 40 MG PO TBEC: 40.0000 mg | DELAYED_RELEASE_TABLET | Freq: Two times a day (BID) | ORAL | 6 refills | Status: DC

## 2024-03-29 NOTE — Telephone Encounter (Signed)
 Spoke with patients. Discussed provider recommendations. Patient would like to try Pantoprazole . Rx sent to [facility].

## 2024-03-29 NOTE — Telephone Encounter (Signed)
 Thank you for discussion with patient. Agree, by still being in same class, risks are possible to be similar. Lansoprazole  and Pantoprazole  are class similar to Omeprazole , so she didn't really have symptoms with Omeprazole  (though control of her symptoms hasn't been adequate with it, so a reason for us  to try and switch her medication). With this being said, the reason we are trying to get Voquenza is that she has NERD based on her current endoscopic evaluations. If we can get her to Voquenza, then can see if this will help, but we need to go through other PPIs first. She is likely heading towards a pH impedence study in the future, if necessary otherwise. If she wants to not do Lansoprazole  then can do Pantoprazole  40 mg twice daily (60/6) to see how she does. Thanks. GM

## 2024-04-10 ENCOUNTER — Telehealth: Payer: Self-pay

## 2024-04-10 DIAGNOSIS — E1169 Type 2 diabetes mellitus with other specified complication: Secondary | ICD-10-CM | POA: Diagnosis not present

## 2024-04-10 NOTE — Telephone Encounter (Signed)
 PAP: Patient assistance application for Farxiga has been approved by PAP Companies: AZ&ME from 04/10/2024 to 09/12/2024. Medication should be delivered to PAP Delivery: Home. For further shipping updates, please contact [employer] (AZ[employer]Me) at [phone]. Patient ID is: [id number]

## 2024-04-10 NOTE — Progress Notes (Signed)
 Pharmacy Medication Assistance Program Note    04/10/2024  Patient ID: Jessica Yates, female   DOB: 05-07-1951, 73 y.o.   MRN: [id number]     03/06/2024  Outreach Medication One  Initial Outreach Date (Medication One) 03/06/2024  Manufacturer Medication One [employer]  [employer] [facility] Farxiga  Dose of Farxiga 10mg   Type of Radiographer, therapeutic Assistance  Date Application Sent to Patient 03/06/2024  Application Items Requested Application  Date Application Sent to Prescriber 03/06/2024  Name of Prescriber Kennieth Leech  Patient Assistance Determination Approved  Approval Start Date 04/10/2024  Approval End Date 09/12/2024  Patient Notification Method Telephone Call     Signature

## 2024-04-10 NOTE — Telephone Encounter (Signed)
 PAP: Patient assistance application for Ozempic has been approved by PAP Companies: [employer] from 04/09/2024 to 09/12/2024. Medication should be delivered to PAP Delivery: Provider's office. For further shipping updates, please contact [employer] at [phone]. Patient ID is: [id number]

## 2024-04-10 NOTE — Telephone Encounter (Signed)
 Completed dose decrease form foe Ozempic and faxed to Dr. Kennieth Leech to review and sign and return to [employer] at [phone]

## 2024-04-10 NOTE — Telephone Encounter (Signed)
 PAP: Application for Jessica Yates has been submitted to [employer] (AZ&Me), via fax

## 2024-04-26 DIAGNOSIS — E1169 Type 2 diabetes mellitus with other specified complication: Secondary | ICD-10-CM | POA: Diagnosis not present

## 2024-05-11 DIAGNOSIS — E1169 Type 2 diabetes mellitus with other specified complication: Secondary | ICD-10-CM | POA: Diagnosis not present

## 2024-05-30 ENCOUNTER — Ambulatory Visit (INDEPENDENT_AMBULATORY_CARE_PROVIDER_SITE_OTHER): Admitting: Podiatry

## 2024-05-30 ENCOUNTER — Encounter: Payer: Self-pay | Admitting: Podiatry

## 2024-05-30 DIAGNOSIS — E0822 Diabetes mellitus due to underlying condition with diabetic chronic kidney disease: Secondary | ICD-10-CM

## 2024-05-30 DIAGNOSIS — M79674 Pain in right toe(s): Secondary | ICD-10-CM

## 2024-05-30 DIAGNOSIS — N183 Chronic kidney disease, stage 3 unspecified: Secondary | ICD-10-CM

## 2024-05-30 DIAGNOSIS — B351 Tinea unguium: Secondary | ICD-10-CM | POA: Diagnosis not present

## 2024-05-30 DIAGNOSIS — Z794 Long term (current) use of insulin: Secondary | ICD-10-CM

## 2024-05-30 DIAGNOSIS — M79675 Pain in left toe(s): Secondary | ICD-10-CM | POA: Diagnosis not present

## 2024-05-31 ENCOUNTER — Encounter: Payer: Self-pay | Admitting: Podiatry

## 2024-05-31 DIAGNOSIS — E1169 Type 2 diabetes mellitus with other specified complication: Secondary | ICD-10-CM | POA: Diagnosis not present

## 2024-05-31 NOTE — Progress Notes (Signed)
 This patient returns to my office for at risk foot care.  This patient requires this care by a professional since this patient will be at risk due to having Type 2 Diabetes, CKD and lymphedema.  This patient is unable to cut nails herself since the patient cannot reach her nails.These nails are painful walking and wearing shoes.  This patient presents for at risk foot care today.  General Appearance  Alert, conversant and in no acute stress.  Vascular  Dorsalis pedis and posterior tibial  pulses are  weakly palpable  bilaterally.  Capillary return is within normal limits  bilaterally. Temperature is within normal limits  bilaterally.Severe lymphedema.  Neurologic  Senn-Weinstein monofilament wire test within normal limits  bilaterally. Muscle power within normal limits bilaterally.  Nails Thick disfigured discolored nails with subungual debris  from hallux to fifth toes bilaterally. No evidence of bacterial infection or drainage bilaterally.  Orthopedic  No limitations of motion  feet .  No crepitus or effusions noted.  No bony pathology or digital deformities noted.Pes planus.  HAV  B/L.  Skin  normotropic skin with no porokeratosis noted bilaterally.  No signs of infections or ulcers noted.   Callus lateral aspect of both ankles due to lymphedema.  Onychomycosis  Pain in right toes  Pain in left toes  Callus  B/L.  Consent was obtained for treatment procedures.   Mechanical debridement of nails 1-5  bilaterally performed with a nail nipper.  Filed with dremel without incident. Debride hyperkeratosis with dremel tool.   Return office visit   10 weeks                   Told patient to return for periodic foot care and evaluation due to potential at risk complications.   Cordella Bold DPM

## 2024-06-01 ENCOUNTER — Telehealth: Payer: Self-pay | Admitting: Gastroenterology

## 2024-06-01 NOTE — Telephone Encounter (Signed)
 Inbound call from patient requesting to speak with a nurse in regards to complications she is having as well as medications. Please advise.

## 2024-06-01 NOTE — Telephone Encounter (Signed)
Line rings then goes busy 

## 2024-06-04 NOTE — Telephone Encounter (Signed)
 Line busy

## 2024-06-05 NOTE — Telephone Encounter (Signed)
Unable to reach pt will await further communication  

## 2024-06-08 ENCOUNTER — Telehealth: Payer: Self-pay | Admitting: Gastroenterology

## 2024-06-08 ENCOUNTER — Other Ambulatory Visit: Payer: Self-pay

## 2024-06-08 MED ORDER — PANTOPRAZOLE SODIUM 40 MG PO TBEC: 40.0000 mg | DELAYED_RELEASE_TABLET | Freq: Two times a day (BID) | ORAL | 6 refills | Status: DC

## 2024-06-08 NOTE — Telephone Encounter (Signed)
 See alternate note pt has been called and all questions answered

## 2024-06-08 NOTE — Telephone Encounter (Signed)
 Inbound call from patient stating that she would like to nurse to give her call it is in regards to her hiatal hernia. Please advise.

## 2024-06-12 ENCOUNTER — Telehealth: Payer: Self-pay

## 2024-06-12 NOTE — Telephone Encounter (Signed)
 2026 Renewal  PAP: Patient assistance application for Farxiga through [employer] (AZ&Me) has been mailed to pt's home address on file. Provider portion of application will be faxed to provider's office.  Will fax provider portion of application to Dr. Kennieth Leech at [facility]

## 2024-06-13 DIAGNOSIS — E1169 Type 2 diabetes mellitus with other specified complication: Secondary | ICD-10-CM | POA: Diagnosis not present

## 2024-06-15 NOTE — Telephone Encounter (Signed)
 Spoke with pt. She had multiple questions regarding her hiatal hernia including if there is any follow up she needs to have to monitor the size. What to expect regarding symptoms from a hiatal hernia. Discussed in detail with pt. All questions answered.   Pt also reports she is still having coughing symptoms, especially in the morning. Pt would like to know if there is something stronger that she can try for this. She currently takes Pantoprazole  40 mg BID. She denies symptoms of heart burn and reflux, I just have the coughing still every morning.

## 2024-06-15 NOTE — Telephone Encounter (Signed)
 As I have told the patient previously, it is not completely clear to me that all of her symptoms are result of reflux or her hiatal hernia.  For now continue the current medications.  Set her up for a clinic follow-up with one of the APP's.  I think it is best for her to also follow-up in regards to her chronic cough with her pulmonary physician and I recommend she reach out to their office to work on scheduling. Thank you. GM

## 2024-06-15 NOTE — Telephone Encounter (Signed)
 Inbound call from patient requesting a call to discuss hiatal hernia and medication that was recently called in. Please advise, thank you

## 2024-06-19 ENCOUNTER — Telehealth: Payer: Self-pay | Admitting: Gastroenterology

## 2024-06-19 MED ORDER — PANTOPRAZOLE SODIUM 40 MG PO TBEC: 40.0000 mg | DELAYED_RELEASE_TABLET | Freq: Two times a day (BID) | ORAL | 2 refills | Status: DC

## 2024-06-19 NOTE — Telephone Encounter (Signed)
 Prescription for Pantoprazole  sent to [facility].

## 2024-06-19 NOTE — Telephone Encounter (Signed)
 Inbound call from pharmacy stating patient is requesting medication pantoprazole  from them but medication prescription sent to different pharmacy. Patient would like medication sent to [facility]  [phone] Please advise  Thank you

## 2024-06-20 NOTE — Telephone Encounter (Signed)
 Attempted to reach patient. No answer, left VM for patient to return call.

## 2024-06-21 NOTE — Telephone Encounter (Signed)
 Pt returned call. Discussed provider recommendations that he does not want to change her medications at this time. Pt had multiple questions regarding her current medications and if the dosage was the strongest. Spent 20 minutes discussing medication doses with pt. Discussed scheduling a f/u appt with pulmonary provider to further discuss chronic cough. Per Dr. Wilhelmenia, scheduled f/u ov for November with APP. Pt verbalized understanding at the end of the call.

## 2024-06-27 DIAGNOSIS — R809 Proteinuria, unspecified: Secondary | ICD-10-CM | POA: Diagnosis not present

## 2024-06-27 DIAGNOSIS — E1122 Type 2 diabetes mellitus with diabetic chronic kidney disease: Secondary | ICD-10-CM | POA: Diagnosis not present

## 2024-06-27 DIAGNOSIS — Q6102 Congenital multiple renal cysts: Secondary | ICD-10-CM | POA: Diagnosis not present

## 2024-06-27 DIAGNOSIS — N182 Chronic kidney disease, stage 2 (mild): Secondary | ICD-10-CM | POA: Diagnosis not present

## 2024-06-27 DIAGNOSIS — I890 Lymphedema, not elsewhere classified: Secondary | ICD-10-CM | POA: Diagnosis not present

## 2024-06-27 DIAGNOSIS — E1169 Type 2 diabetes mellitus with other specified complication: Secondary | ICD-10-CM | POA: Diagnosis not present

## 2024-06-28 ENCOUNTER — Other Ambulatory Visit (HOSPITAL_COMMUNITY): Payer: Self-pay

## 2024-06-28 ENCOUNTER — Telehealth: Payer: Self-pay

## 2024-06-28 NOTE — Progress Notes (Signed)
   06/28/2024  Patient ID: Jessica Yates, female   DOB: [birth year], 73 y.o.   MRN: [id number]  Spoke to patient today, informed of Farxiga PAP re-enrollment process, the letter has been mailed to her home. I informed her of the recent Novo PAP update regarding Ozempic. I also provided her with the contact information for [facility]. She has a PCP appt on 07/12/24, I  will follow up with her after that. She will reach out to me with any questions or concerns.   Heather Factor, PharmD Clinical Pharmacist  [phone]

## 2024-06-29 NOTE — Telephone Encounter (Signed)
 Received provider portion of patient assistance application

## 2024-07-02 ENCOUNTER — Other Ambulatory Visit (HOSPITAL_COMMUNITY): Payer: Self-pay

## 2024-07-16 ENCOUNTER — Telehealth: Payer: Self-pay | Admitting: Cardiology

## 2024-07-16 NOTE — Telephone Encounter (Signed)
 Sure, I have not see Jessica Yates in the past.   Dr. Michele

## 2024-07-16 NOTE — Telephone Encounter (Signed)
 Pt would like to switch providers from [facility] to [facility]. Please advise.

## 2024-07-17 ENCOUNTER — Telehealth: Payer: Self-pay

## 2024-07-17 ENCOUNTER — Ambulatory Visit: Admitting: Cardiology

## 2024-07-17 NOTE — Telephone Encounter (Signed)
 Attempted to cal pt. Regarding Farxiga patient assistance application mailed 9/30.  Pt.'s voicemail was full and I could not leave a message

## 2024-07-17 NOTE — Telephone Encounter (Signed)
 Completed reorder for for Ozempic through [employer] and Faxed to Dr. Kennieth Leech ant [facility] for review

## 2024-07-26 DIAGNOSIS — M170 Bilateral primary osteoarthritis of knee: Secondary | ICD-10-CM | POA: Diagnosis not present

## 2024-07-26 DIAGNOSIS — G8929 Other chronic pain: Secondary | ICD-10-CM | POA: Diagnosis not present

## 2024-07-26 DIAGNOSIS — M1712 Unilateral primary osteoarthritis, left knee: Secondary | ICD-10-CM | POA: Diagnosis not present

## 2024-07-26 DIAGNOSIS — M1711 Unilateral primary osteoarthritis, right knee: Secondary | ICD-10-CM | POA: Diagnosis not present

## 2024-07-26 DIAGNOSIS — M25561 Pain in right knee: Secondary | ICD-10-CM | POA: Diagnosis not present

## 2024-07-26 DIAGNOSIS — M25562 Pain in left knee: Secondary | ICD-10-CM | POA: Diagnosis not present

## 2024-07-26 NOTE — Progress Notes (Deleted)
 Chief Complaint: Chronic cough and GERD  HPI:    [name] is a 73 year old African-American female, known to Dr. Wilhelmenia, with a past medical history as listed below including depression and diabetes, who was referred to me by Benjamine Aland, MD for a complaint of chronic cough and GERD.      03/24/2021 colonoscopy with 2 to 3-4 mm polyps in the transverse and ascending colon otherwise normal.  Pathology showed adenoma and sessile serrated polyp.  Repeat recommended in 5 years.   3 /14/25 EGD with Z-line irregular at 38 cm from the incisors, 2 cm hiatal hernia, multiple gastric polyps, erythematous mucosa in the stomach and otherwise normal.  Pathology showed reactive gastropathy with mild chronic gastritis.    06/15/2024 Dr. Wilhelmenia discussed that he did not think that all of her symptoms were a result of reflux or hiatal hernia.  Recommended continuing current medications.  Recommended she follow with pulmonary physician for chronic cough.  Past Medical History:  Diagnosis Date   Allergic rhinitis    Anemia 2002   Anxiety    Arthritis    Asthma    Blood clot in vein    Cellulitis    Cellulitis of right lower extremity    Chronic diastolic (congestive) heart failure (HCC)    Chronic kidney disease, stage 3 (HCC)    Coagulation defect    Cough    Depression 02/06/2008   Diabetes mellitus, type 2 (HCC)    DM2 (diabetes mellitus, type 2) (HCC) 11/18/2022   DVT (deep venous thrombosis) (HCC) 2002   left leg   Dysphagia    Elevated hemoglobin A1c    GERD (gastroesophageal reflux disease)    Glaucoma    Holosystolic murmur    Hyperlipidemia 01/29/2023   Kidney cysts    Leukocytosis    Liver cyst    Lymphedema    Morbid obesity (HCC)    Overactive bladder    Palpitations    Peripheral venous insufficiency    Primary open angle glaucoma (POAG) of both eyes, mild stage    Seasonal allergies    Sickle cell trait    Tortuous aorta    Unstable gait    Upper airway cough  syndrome    Vitamin D deficiency     Past Surgical History:  Procedure Laterality Date   COLONOSCOPY     COSMETIC SURGERY  09/13/1973   nasal reconstruction   TOTAL ABDOMINAL HYSTERECTOMY  07/14/2001    Current Outpatient Medications  Medication Sig Dispense Refill   acetaminophen  (TYLENOL ) 650 MG CR tablet Take 650 mg by mouth every 8 (eight) hours as needed for pain.     albuterol  (VENTOLIN  HFA) 108 (90 Base) MCG/ACT inhaler TAKE 2 PUFFS BY MOUTH EVERY 6 HOURS AS NEEDED FOR WHEEZE OR SHORTNESS OF BREATH 18 each 0   aspirin  81 MG chewable tablet Chew 81 mg by mouth at bedtime.     blood glucose meter kit and supplies KIT Dispense based on patient and insurance preference. Use up to four times daily as directed. (FOR ICD-9 250.00, 250.01). 1 each 0   Cholecalciferol (VITAMIN D3) 25 MCG (1000 UT) capsule Take 1,000 Units by mouth daily.     Continuous Glucose Receiver (FREESTYLE LIBRE 3 READER) DEVI USE TO READ CONTINOUS GLUCOSE MONITOR DAILY     cyanocobalamin  (VITAMIN B12) 500 MCG tablet Take 1 tablet (500 mcg total) by mouth daily. 30 tablet 0   dapagliflozin propanediol (FARXIGA) 10 MG TABS tablet Take 10 mg  by mouth daily before breakfast.     fexofenadine (ALLEGRA) 180 MG tablet Take 180 mg by mouth daily as needed.     fluticasone  (FLONASE ) 50 MCG/ACT nasal spray Place 2 sprays into both nostrils daily. (Patient taking differently: Place 2 sprays into both nostrils daily as needed for allergies or rhinitis.) 1 g 5   folic acid  (FOLVITE ) 1 MG tablet Take 1 tablet (1 mg total) by mouth daily. 30 tablet 0   furosemide  (LASIX ) 40 MG tablet Take 1 tablet (40 mg total) by mouth daily. 90 tablet 2   losartan (COZAAR) 25 MG tablet Take 12.5 mg by mouth See admin instructions. Take 12.5 mg by mouth only when taking Furosemide      Omega-3 Fatty Acids (FISH OIL PO) Take 1 tablet by mouth daily.     OZEMPIC, 1 MG/DOSE, 4 MG/3ML SOPN Inject 1 mg into the skin once a week.     pantoprazole   (PROTONIX ) 40 MG tablet Take 1 tablet (40 mg total) by mouth 2 (two) times daily before a meal. 90 tablet 2   rosuvastatin  (CRESTOR ) 40 MG tablet Take 1 tablet (40 mg total) by mouth daily. 90 tablet 2   timolol  (TIMOPTIC ) 0.5 % ophthalmic solution Place 1 drop into both eyes in the morning and at bedtime.     TRAVATAN Z 0.004 % SOLN ophthalmic solution Place 1 drop into both eyes at bedtime.      No current facility-administered medications for this visit.    Allergies as of 07/30/2024 - Review Complete 05/31/2024  Allergen Reaction Noted   Grass pollen([id number]-swt vern) Other (See Comments) 11/14/2023    Family History  Problem Relation Age of Onset   Allergies Mother    Asthma Mother    Other Mother        enlarged heart   Arthritis Mother    Breast cancer Mother    Diabetes Mother    Hypertension Mother    Cancer Mother        breast   Hyperlipidemia Mother    Cancer Father 39       leukemia   Colon polyps Sister    Allergies Sister    Arthritis Sister    Heart disease Maternal Grandmother    Breast cancer Maternal Grandmother    Birth defects Maternal Grandmother        breast   Colon cancer Neg Hx    Esophageal cancer Neg Hx    Inflammatory bowel disease Neg Hx    Liver disease Neg Hx    Pancreatic cancer Neg Hx    Rectal cancer Neg Hx    Stomach cancer Neg Hx     Social History   Socioeconomic History   Marital status: Single    Spouse name: Not on file   Number of children: 0   Years of education: Not on file   Highest education level: Not on file  Occupational History   Occupation: retired from [employer]  Tobacco Use   Smoking status: Never   Smokeless tobacco: Never  Vaping Use   Vaping status: Never Used  Substance and Sexual Activity   Alcohol use: No   Drug use: No   Sexual activity: Never  Other Topics Concern   Not on file  Social History Narrative   Lives with mother and brother         Social Drivers of Manufacturing Engineer Strain: Not on file  Food Insecurity: No Food  Insecurity (01/29/2023)   Hunger Vital Sign    Worried About Running Out of Food in the Last Year: Never true    Ran Out of Food in the Last Year: Never true  Transportation Needs: No Transportation Needs (01/29/2023)   PRAPARE - Administrator, Civil Service (Medical): No    Lack of Transportation (Non-Medical): No  Physical Activity: Not on file  Stress: Not on file  Social Connections: Not on file  Intimate Partner Violence: Not At Risk (01/29/2023)   Humiliation, Afraid, Rape, and Kick questionnaire    Fear of Current or Ex-Partner: No    Emotionally Abused: No    Physically Abused: No    Sexually Abused: No    Review of Systems:    Constitutional: No weight loss, fever, chills, weakness or fatigue HEENT: Eyes: No change in vision               Ears, Nose, Throat:  No change in hearing or congestion Skin: No rash or itching Cardiovascular: No chest pain, chest pressure or palpitations   Respiratory: No SOB or cough Gastrointestinal: See HPI and otherwise negative Genitourinary: No dysuria or change in urinary frequency Neurological: No headache, dizziness or syncope Musculoskeletal: No new muscle or joint pain Hematologic: No bleeding or bruising Psychiatric: No history of depression or anxiety    Physical Exam:  Vital signs: There were no vitals taken for this visit.  Constitutional:   Pleasant Caucasian female appears to be in NAD, Well developed, Well nourished, alert and cooperative Head:  Normocephalic and atraumatic. Eyes:   PEERL, EOMI. No icterus. Conjunctiva pink. Ears:  Normal auditory acuity. Neck:  Supple Throat: Oral cavity and pharynx without inflammation, swelling or lesion.  Respiratory: Respirations even and unlabored. Lungs clear to auscultation bilaterally.   No wheezes, crackles, or rhonchi.  Cardiovascular: Normal S1, S2. No MRG. Regular rate and rhythm. No peripheral  edema, cyanosis or pallor.  Gastrointestinal:  Soft, nondistended, nontender. No rebound or guarding. Normal bowel sounds. No appreciable masses or hepatomegaly. Rectal:  Not performed.  Msk:  Symmetrical without gross deformities. Without edema, no deformity or joint abnormality.  Neurologic:  Alert and  oriented x4;  grossly normal neurologically.  Skin:   Dry and intact without significant lesions or rashes. Psychiatric: Oriented to person, place and time. Demonstrates good judgement and reason without abnormal affect or behaviors.  RELEVANT LABS AND IMAGING: CBC    Component Value Date/Time   WBC 11.4 (H) 08/04/2023 1012   RBC 4.11 08/04/2023 1012   HGB 11.0 (L) 08/04/2023 1012   HGB 11.6 07/24/2021 1249   HGB 12.0 07/17/2008 1449   HCT 34.0 (L) 08/04/2023 1012   HCT 36.7 07/24/2021 1249   HCT 35.6 07/17/2008 1449   PLT 152 08/04/2023 1012   PLT 382 07/24/2021 1249   MCV 82.7 08/04/2023 1012   MCV 81 07/24/2021 1249   MCV 83.2 07/17/2008 1449   MCH 26.8 08/04/2023 1012   MCHC 32.4 08/04/2023 1012   RDW 17.0 (H) 08/04/2023 1012   RDW 14.8 07/24/2021 1249   RDW 14.6 (H) 07/17/2008 1449   LYMPHSABS 0.4 (L) 08/04/2023 1012   LYMPHSABS 1.0 07/17/2008 1449   MONOABS 0.3 08/04/2023 1012   MONOABS 0.2 07/17/2008 1449   EOSABS 0.0 08/04/2023 1012   EOSABS 0.1 07/17/2008 1449   BASOSABS 0.0 08/04/2023 1012   BASOSABS 0.0 07/17/2008 1449    CMP     Component Value Date/Time   NA 135 08/04/2023  1012   NA 139 11/03/2021 1610   K 3.9 08/04/2023 1012   CL 107 08/04/2023 1012   CO2 22 08/04/2023 1012   GLUCOSE 150 (H) 08/04/2023 1012   BUN 12 08/04/2023 1012   BUN 20 11/03/2021 1610   CREATININE 0.77 08/04/2023 1012   CALCIUM  10.0 08/04/2023 1012   PROT 8.4 (H) 06/11/2023 0230   PROT 7.3 01/14/2022 1258   ALBUMIN  3.8 06/11/2023 0230   ALBUMIN  4.5 01/14/2022 1258   AST 30 06/11/2023 0230   ALT 20 06/11/2023 0230   ALKPHOS 83 06/11/2023 0230   BILITOT 1.0 06/11/2023 0230    BILITOT 0.3 01/14/2022 1258   GFRNONAA >60 08/04/2023 1012   GFRAA 66 10/13/2020 1653    Assessment: 1.  GERD: 2.  Chronic cough:  Plan: 1. ***     Delon Failing, PA-C [facility] 07/26/2024, 11:25 AM  Cc: Benjamine Aland, MD

## 2024-07-30 ENCOUNTER — Ambulatory Visit: Admitting: Physician Assistant

## 2024-07-31 NOTE — Telephone Encounter (Signed)
 Faxed completed and signed reorder from to [employer]

## 2024-08-03 NOTE — Telephone Encounter (Signed)
 PAP: Application for Jessica Yates has been submitted to [employer] (AZ&Me), via fax

## 2024-08-06 ENCOUNTER — Ambulatory Visit: Admitting: Podiatry

## 2024-08-07 DIAGNOSIS — H401132 Primary open-angle glaucoma, bilateral, moderate stage: Secondary | ICD-10-CM | POA: Diagnosis not present

## 2024-08-07 DIAGNOSIS — E119 Type 2 diabetes mellitus without complications: Secondary | ICD-10-CM | POA: Diagnosis not present

## 2024-08-08 NOTE — Telephone Encounter (Signed)
 PAP: Patient assistance application for Farxiga has been approved by PAP Companies: [employer] from 09/13/2024 to 12/31/20026. Medication should be delivered to PAP Delivery: Home. For further shipping updates, please contact [employer] ([employer]) at [phone]. Patient ID is: [id number]

## 2024-08-15 ENCOUNTER — Ambulatory Visit: Admitting: Cardiology

## 2024-09-01 ENCOUNTER — Other Ambulatory Visit: Payer: Self-pay | Admitting: Gastroenterology

## 2024-09-05 ENCOUNTER — Ambulatory Visit: Admitting: Podiatry

## 2024-09-12 ENCOUNTER — Ambulatory Visit: Attending: Internal Medicine | Admitting: Student in an Organized Health Care Education/Training Program

## 2024-09-12 ENCOUNTER — Encounter: Payer: Self-pay | Admitting: Student in an Organized Health Care Education/Training Program

## 2024-09-12 VITALS — BP 94/62 | HR 82 | Ht 67.0 in | Wt 220.0 lb

## 2024-09-12 DIAGNOSIS — E785 Hyperlipidemia, unspecified: Secondary | ICD-10-CM | POA: Diagnosis not present

## 2024-09-12 DIAGNOSIS — I10 Essential (primary) hypertension: Secondary | ICD-10-CM

## 2024-09-12 DIAGNOSIS — I2510 Atherosclerotic heart disease of native coronary artery without angina pectoris: Secondary | ICD-10-CM | POA: Diagnosis not present

## 2024-09-12 DIAGNOSIS — I890 Lymphedema, not elsewhere classified: Secondary | ICD-10-CM | POA: Diagnosis not present

## 2024-09-12 DIAGNOSIS — I5189 Other ill-defined heart diseases: Secondary | ICD-10-CM

## 2024-09-12 NOTE — Assessment & Plan Note (Signed)
-   The patient has a history of diastolic dysfunction without confirmed diastolic CHF. -The swelling in her legs is predominantly from lymphedema. -No symptoms of heart failure currently and no previous heart failure hospitalizations. -No changes to her current regimen

## 2024-09-12 NOTE — Assessment & Plan Note (Signed)
-   Patient blood pressure is well-controlled.  No changes. Continue losartan 12.5 mg daily

## 2024-09-12 NOTE — Assessment & Plan Note (Signed)
-   CAC score mildly elevated 18.8. - No chest pain symptoms.  No changes. Continue aspirin  Continue statin

## 2024-09-12 NOTE — Progress Notes (Signed)
 " Cardiology Office Note:   Date:  09/12/2024  ID:  Jessica Yates, DOB 08/18/1951, MRN [id number] PCP: Benjamine Aland, MD  [facility] Providers Cardiologist:  Georganna Archer, MD { Chief Complaint:  Chief Complaint  Patient presents with   Follow-up      History of Present Illness:   Jessica Yates is a 73 y.o. female with a PMH of HFpEF, HLD, DM2, asthma, chronic lymphedema, CKD, prior DVT who presents for follow up.  Presents today for follow-up.  Reports that she has not been taking her Lasix  or using her compression stockings consistently due to the annoyance of taking them.  Additionally reports several complaints including some tingling in her left fingers that has been improving and pain in her knees.  Otherwise tolerating her medications okay and no other complaints.  Her vascular surgeon recently retired and is looking to reestablish care with vascular surgery for lymphedema.  Past Medical History:  Diagnosis Date   Allergic rhinitis    Anemia 2002   Anxiety    Arthritis    Asthma    Blood clot in vein    Cellulitis    Cellulitis of right lower extremity    Chronic diastolic (congestive) heart failure (HCC)    Chronic kidney disease, stage 3 (HCC)    Coagulation defect    Cough    Depression 02/06/2008   Diabetes mellitus, type 2 (HCC)    DM2 (diabetes mellitus, type 2) (HCC) 11/18/2022   DVT (deep venous thrombosis) (HCC) 2002   left leg   Dysphagia    Elevated hemoglobin A1c    GERD (gastroesophageal reflux disease)    Glaucoma    Holosystolic murmur    Hyperlipidemia 01/29/2023   Kidney cysts    Leukocytosis    Liver cyst    Lymphedema    Morbid obesity (HCC)    Overactive bladder    Palpitations    Peripheral venous insufficiency    Primary open angle glaucoma (POAG) of both eyes, mild stage    Seasonal allergies    Sickle cell trait    Tortuous aorta    Unstable gait    Upper airway cough syndrome    Vitamin D deficiency       Studies Reviewed:    EKG:  EKG Interpretation Date/Time:  Wednesday September 12 2024 16:34:07 EST Ventricular Rate:  85 PR Interval:  178 QRS Duration:  74 QT Interval:  340 QTC Calculation: 404 R Axis:   40  Text Interpretation: Normal sinus rhythm Nonspecific T wave abnormality When compared with ECG of 17-Nov-2022 20:59, PREVIOUS ECG IS PRESENT Confirmed by Archer Georganna [id number]) on 09/12/2024 4:37:43 PM     Cardiac Studies & Procedures   ______________________________________________________________________________________________     ECHOCARDIOGRAM  ECHOCARDIOGRAM COMPLETE 09/11/2021  Narrative ECHOCARDIOGRAM REPORT    Patient Name:   Jessica Yates Date of Exam: 09/11/2021 Medical Rec #:  [id number]         Height:       67.0 in Accession #:    [id number]        Weight:       227.0 lb Date of Birth:  March 13, 1951         BSA:          2.134 m Patient Age:    70 years          BP:           128/81 mmHg Patient Gender: F  HR:           86 bpm. Exam Location:  [address]  Procedure: 2D Echo, Cardiac Doppler and Color Doppler  Indications:    I51.89 Diastolic Dysfunction  History:        Patient has prior history of Echocardiogram examinations, most recent 07/26/2018. Signs/Symptoms:Murmur; Risk Factors:Diabetes and Obesity. Chronic kidney disease. DVT. Palpitations.  Sonographer:    Carl Rodgers-Jones RDCS Referring Phys: [id number] DAYNA N DUNN  IMPRESSIONS   1. Left ventricular ejection fraction, by estimation, is 70 to 75%. The left ventricle has hyperdynamic function. The left ventricle has no regional wall motion abnormalities. Left ventricular diastolic parameters are indeterminate. 2. Right ventricular systolic function is mildly reduced. The right ventricular size is mildly enlarged. There is normal pulmonary artery systolic pressure. 3. Left atrial size was mildly dilated. 4. The mitral valve is normal in structure. Trivial  mitral valve regurgitation. No evidence of mitral stenosis. 5. The aortic valve is tricuspid. Aortic valve regurgitation is not visualized. No aortic stenosis is present. 6. Aortic dilatation noted. There is borderline dilatation of the ascending aorta, measuring 36 mm. 7. The inferior vena cava is normal in size with greater than 50% respiratory variability, suggesting right atrial pressure of 3 mmHg.  FINDINGS Left Ventricle: Left ventricular ejection fraction, by estimation, is 70 to 75%. The left ventricle has hyperdynamic function. The left ventricle has no regional wall motion abnormalities. The left ventricular internal cavity size was normal in size. There is no left ventricular hypertrophy. Left ventricular diastolic parameters are indeterminate. Indeterminate filling pressures.  Right Ventricle: The right ventricular size is mildly enlarged. No increase in right ventricular wall thickness. Right ventricular systolic function is mildly reduced. There is normal pulmonary artery systolic pressure. The tricuspid regurgitant velocity is 1.80 m/s, and with an assumed right atrial pressure of 3 mmHg, the estimated right ventricular systolic pressure is 16.0 mmHg.  Left Atrium: Left atrial size was mildly dilated.  Right Atrium: Right atrial size was normal in size.  Pericardium: There is no evidence of pericardial effusion.  Mitral Valve: The mitral valve is normal in structure. Trivial mitral valve regurgitation. No evidence of mitral valve stenosis.  Tricuspid Valve: The tricuspid valve is normal in structure. Tricuspid valve regurgitation is trivial. No evidence of tricuspid stenosis.  Aortic Valve: The aortic valve is tricuspid. Aortic valve regurgitation is not visualized. No aortic stenosis is present.  Pulmonic Valve: The pulmonic valve was normal in structure. Pulmonic valve regurgitation is trivial. No evidence of pulmonic stenosis.  Aorta: Aortic dilatation noted. There is  borderline dilatation of the ascending aorta, measuring 36 mm.  Venous: The inferior vena cava is normal in size with greater than 50% respiratory variability, suggesting right atrial pressure of 3 mmHg.  IAS/Shunts: No atrial level shunt detected by color flow Doppler.   LEFT VENTRICLE PLAX 2D LVIDd:         4.50 cm   Diastology LVIDs:         2.70 cm   LV e' medial:    9.36 cm/s LV PW:         1.00 cm   LV E/e' medial:  10.7 LV IVS:        0.70 cm   LV e' lateral:   10.60 cm/s LVOT diam:     2.00 cm   LV E/e' lateral: 9.4 LV SV:         80 LV SV Index:   37 LVOT Area:  3.14 cm   RIGHT VENTRICLE             IVC RV Basal diam:  4.60 cm     IVC diam: 2.30 cm RV S prime:     14.30 cm/s TAPSE (M-mode): 2.4 cm  LEFT ATRIUM             Index        RIGHT ATRIUM           Index LA diam:        3.90 cm 1.83 cm/m   RA Area:     18.10 cm LA Vol (A2C):   76.1 ml 35.66 ml/m  RA Volume:   55.90 ml  26.19 ml/m LA Vol (A4C):   59.1 ml 27.69 ml/m LA Biplane Vol: 70.0 ml 32.80 ml/m AORTIC VALVE LVOT Vmax:   131.00 cm/s LVOT Vmean:  86.467 cm/s LVOT VTI:    0.253 m  AORTA Ao Root diam: 3.50 cm Ao Asc diam:  3.60 cm  MITRAL VALVE                TRICUSPID VALVE MV Area (PHT): 4.21 cm     TR Peak grad:   13.0 mmHg MV Decel Time: 180 msec     TR Vmax:        180.00 cm/s MV E velocity: 100.00 cm/s MV A velocity: 73.90 cm/s   SHUNTS MV E/A ratio:  1.35         Systemic VTI:  0.25 m Systemic Diam: 2.00 cm  Annabella Scarce MD Electronically signed by Annabella Scarce MD Signature Date/Time: 09/11/2021/7:23:20 PM    Final      CT SCANS  CT CARDIAC SCORING (SELF PAY ONLY) 08/26/2021  Addendum 08/26/2021  5:38 PM ADDENDUM REPORT: 08/26/2021 17:36  CLINICAL DATA:  Cardiovascular Disease Risk stratification  EXAM: Coronary Calcium  Score  TECHNIQUE: A gated, non-contrast computed tomography scan of the heart was performed using 3mm slice thickness. Axial images were  analyzed on a dedicated workstation. Calcium  scoring of the coronary arteries was performed using the Agatston method.  FINDINGS: Coronary arteries: Normal origins.  Coronary Calcium  Score:  Left main: 0  Left anterior descending artery: 18.8  Left circumflex artery: 0  Right coronary artery: 0  Total: 18.8  Percentile: 52nd  Pericardium: Normal.  Ascending Aorta: Normal caliber.  Non-cardiac: See separate report from [facility].  IMPRESSION: Coronary calcium  score of 18.8. This was 52nd percentile for age-, race-, and sex-matched controls.  RECOMMENDATIONS: Coronary artery calcium  (CAC) score is a strong predictor of incident coronary heart disease (CHD) and provides predictive information beyond traditional risk factors. CAC scoring is reasonable to use in the decision to withhold, postpone, or initiate statin therapy in intermediate-risk or selected borderline-risk asymptomatic adults (age 58-75 years and LDL-C >=70 to <190 mg/dL) who do not have diabetes or established atherosclerotic cardiovascular disease (ASCVD).* In intermediate-risk (10-year ASCVD risk >=7.5% to <20%) adults or selected borderline-risk (10-year ASCVD risk >=5% to <7.5%) adults in whom a CAC score is measured for the purpose of making a treatment decision the following recommendations have been made:  If CAC=0, it is reasonable to withhold statin therapy and reassess in 5 to 10 years, as long as higher risk conditions are absent (diabetes mellitus, family history of premature CHD in first degree relatives (males <55 years; females <65 years), cigarette smoking, or LDL >=190 mg/dL).  If CAC is 1 to 99, it is reasonable to initiate statin therapy for patients >=55  years of age.  If CAC is >=100 or >=75th percentile, it is reasonable to initiate statin therapy at any age.  Cardiology referral should be considered for patients with CAC scores >=400 or >=75th percentile.  *2018  AHA/ACC/AACVPR/AAPA/ABC/ACPM/ADA/AGS/APhA/ASPC/NLA/PCNA Guideline on the Management of Blood Cholesterol: A Report of the American College of Cardiology/American Heart Association Task Force on Clinical Practice Guidelines. J Am Coll Cardiol. 2019;73(24):3168-3209.  Vinie Maxcy, MD   Electronically Signed By: Vinie JAYSON Maxcy M.D. On: 08/26/2021 17:36  Narrative EXAM: OVER-READ INTERPRETATION  CT CHEST  The following report is an over-read performed by radiologist Dr. Franky Crease of [facility] on 08/26/2021. This over-read does not include interpretation of cardiac or coronary anatomy or pathology. The coronary calcium  score interpretation by the cardiologist is attached.  COMPARISON:  None.  FINDINGS: Vascular: Heart is normal size.  Aorta normal caliber.  Mediastinum/Nodes: No adenopathy  Lungs/Pleura: Minimal airspace opacity at the right lung base, favor atelectasis. Areas of scarring in the lung bases. No effusions.  Upper Abdomen: Imaging into the upper abdomen demonstrates no acute findings.  Musculoskeletal: Chest wall soft tissues are unremarkable. No acute bony abnormality.  IMPRESSION: Minimal right basilar dependent density, favor atelectasis.  Bibasilar scarring.  No active disease.  Electronically Signed: By: Franky Crease M.D. On: 08/26/2021 16:05     ______________________________________________________________________________________________      Risk Assessment/Calculations:         STOP-Bang Score:          Physical Exam:     VS:  BP 94/62 (BP Location: Right Arm, Patient Position: Sitting, Cuff Size: Normal)   Pulse 82   Ht 5' 7 (1.702 m)   Wt 220 lb (99.8 kg)   SpO2 96%   BMI 34.46 kg/m      Wt Readings from Last 3 Encounters:  02/24/24 209 lb (94.8 kg)  11/25/23 195 lb (88.5 kg)  11/14/23 195 lb (88.5 kg)     GEN: Well nourished, well developed, in no acute distress NECK: No JVD; No carotid  bruits CARDIAC: RRR, no murmurs, rubs, gallops RESPIRATORY:  Clear to auscultation without rales, wheezing or rhonchi  ABDOMEN: Soft, non-tender, non-distended, normal bowel sounds EXTREMITIES:  Warm and well perfused, 3+ pitting edema bilaterally no deformity, 2+ radial pulses PSYCH: Normal mood and affect   Assessment & Plan Diastolic dysfunction - The patient has a history of diastolic dysfunction without confirmed diastolic CHF. -The swelling in her legs is predominantly from lymphedema. -No symptoms of heart failure currently and no previous heart failure hospitalizations. -No changes to her current regimen Hyperlipidemia, unspecified hyperlipidemia type - Currently on rosuvastatin  for coronary calcium . -Will check lipid panel to check response. Lipid panel Continue Crestor  40 mg daily Lymphedema - Has had chronic lymphedema since she was 18 and has been followed by vascular surgery. - Recently her vascular surgeon retired who is managing her lymphedema and she requests referral to a new vascular surgeon. - Additionally, I instructed her to be more consistent with taking her Lasix  and wearing her compression stockings daily despite affect her knee may be using these therapies.  She states that she will start taking them more consistently. Vascular surgery referral Continue Lasix  40 mg daily Use compression stockings consistently Follow-up in 6 months with PA Follow-up in 12 months with me  Primary hypertension - Patient blood pressure is well-controlled.  No changes. Continue losartan 12.5 mg daily Coronary artery calcification - CAC score mildly elevated 18.8. - No chest pain symptoms.  No changes. Continue aspirin  Continue statin          This note was written with the assistance of a dictation microphone or AI dictation software. Please excuse any typos or grammatical errors.   Signed, Georganna Archer, MD 09/12/2024 12:52 PM    [facility]  "

## 2024-09-12 NOTE — Assessment & Plan Note (Signed)
-   Currently on rosuvastatin  for coronary calcium . -Will check lipid panel to check response. Lipid panel Continue Crestor  40 mg daily

## 2024-09-12 NOTE — Assessment & Plan Note (Signed)
-   Has had chronic lymphedema since she was 31 and has been followed by vascular surgery. - Recently her vascular surgeon retired who is managing her lymphedema and she requests referral to a new vascular surgeon. - Additionally, I instructed her to be more consistent with taking her Lasix  and wearing her compression stockings daily despite affect her knee may be using these therapies.  She states that she will start taking them more consistently. Vascular surgery referral Continue Lasix  40 mg daily Use compression stockings consistently Follow-up in 6 months with PA Follow-up in 12 months with me

## 2024-09-12 NOTE — Patient Instructions (Signed)
 Lab Work: Lipid panel  Magnesium   BMP  If you have labs (blood work) drawn today and your tests are completely normal, you will receive your results only by: MyChart Message (if you have MyChart) OR A paper copy in the mail If you have any lab test that is abnormal or we need to change your treatment, we will call you to review the results.  REFERRAL TO VASCULAR SURGERY    Follow-Up: At [facility], you and your health needs are our priority.  As part of our continuing mission to provide you with exceptional heart care, our providers are all part of one team.  This team includes your primary Cardiologist (physician) and Advanced Practice Providers or APPs (Physician Assistants and Nurse Practitioners) who all work together to provide you with the care you need, when you need it.  Your next appointment:   6 month(s)  Provider:   One of our Advanced Practice Providers (APPs): Morse Clause, PA-C  Lamarr Satterfield, NP Miriam Shams, NP  Olivia Pavy, PA-C Josefa Beauvais, NP  Leontine Salen, PA-C Orren Fabry, PA-C  Ray, PA-C Ernest Dick, NP  Damien Braver, NP Jon Hails, PA-C  Waddell Donath, PA-C    Dayna Dunn, PA-C  Scott Weaver, PA-C Lum Louis, NP Katlyn West, NP Callie Goodrich, PA-C  Xika Zhao, NP Sheng Haley, PA-C    Kathleen Johnson, PA-C   Then, Georganna Archer, MD will plan to see you again in 12 month(s).    We recommend signing up for the patient portal called MyChart.  Sign up information is provided on this After Visit Summary.  MyChart is used to connect with patients for Virtual Visits (Telemedicine).  Patients are able to view lab/test results, encounter notes, upcoming appointments, etc.  Non-urgent messages can be sent to your provider as well.   To learn more about what you can do with MyChart, go to [email or web address].

## 2024-09-17 ENCOUNTER — Telehealth: Payer: Self-pay

## 2024-09-17 NOTE — Telephone Encounter (Signed)
 Ok this is fine. She is nearly new to the practice. She saw Dr Wertt-> then Dr Annella -> saw me once in may 2023 -> can see Dr Pleas now. No issues

## 2024-09-17 NOTE — Telephone Encounter (Signed)
 Copied from CRM [id number]. Topic: Appointments - Transfer of Care >> Sep 14, 2024  3:19 PM Ismael A wrote: Pt is requesting to transfer FROM: Dr. Geronimo Pt is requesting to transfer TO: Dr. Pleas Reason for requested transfer: patient's preference  It is the responsibility of the team the patient would like to transfer to (Dr. Pleas) to reach out to the patient if for any reason this transfer is not acceptable.   Please advise Dr. Pleas and Dr. Geronimo

## 2024-09-17 NOTE — Telephone Encounter (Signed)
Happy to see her.  Thanks ?

## 2024-09-21 ENCOUNTER — Encounter: Payer: Self-pay | Admitting: Gastroenterology

## 2024-09-21 ENCOUNTER — Ambulatory Visit: Admitting: Gastroenterology

## 2024-09-21 VITALS — BP 130/80 | HR 86 | Ht 67.0 in | Wt 227.0 lb

## 2024-09-21 DIAGNOSIS — K219 Gastro-esophageal reflux disease without esophagitis: Secondary | ICD-10-CM | POA: Diagnosis not present

## 2024-09-21 DIAGNOSIS — R053 Chronic cough: Secondary | ICD-10-CM

## 2024-09-21 DIAGNOSIS — K317 Polyp of stomach and duodenum: Secondary | ICD-10-CM

## 2024-09-21 NOTE — Progress Notes (Unsigned)
 "  GASTROENTEROLOGY OUTPATIENT CLINIC VISIT   Primary Care Provider Benjamine Aland, MD [address] [place] [address] [phone]  Patient Profile: STEPHENI Yates is a 74 y.o. female with a pmh significant for Diabetes, hypertension, hyperlipidemia, obesity, sickle cell trait, previous VTE, asthma, arthritis, GERD, IBS.  The patient presents to the [facility] for an evaluation and management of problem(s) noted below:  Problem List No diagnosis found.  Discussed the use of AI scribe software for clinical note transcription with the patient, who gave verbal consent to proceed.  History of Present Illness Please see prior GI notes for full details of HPI.  Interval History     GI Review of Systems Positive as above Negative for dysphagia, melena, hematochezia  Review of Systems General: Denies fevers/chills/weight loss unintentionally Cardiovascular: Denies chest pain Pulmonary: Denies shortness of breath Gastroenterological: See HPI Genitourinary: Denies darkened urine  Hematological: Denies easy bruising/bleeding Dermatological: Denies jaundice Psychological: Mood is stable   Medications Current Outpatient Medications  Medication Sig Dispense Refill   acetaminophen  (TYLENOL ) 650 MG CR tablet Take 650 mg by mouth every 8 (eight) hours as needed for pain.     aspirin  81 MG chewable tablet Chew 81 mg by mouth at bedtime.     blood glucose meter kit and supplies KIT Dispense based on patient and insurance preference. Use up to four times daily as directed. (FOR ICD-9 250.00, 250.01). 1 each 0   Cholecalciferol (VITAMIN D3) 25 MCG (1000 UT) capsule Take 1,000 Units by mouth daily.     Continuous Glucose Receiver (FREESTYLE LIBRE 3 READER) DEVI USE TO READ CONTINOUS GLUCOSE MONITOR DAILY     dapagliflozin propanediol (FARXIGA) 10 MG TABS tablet Take 10 mg by mouth daily before breakfast.     famotidine  (PEPCID ) 40 MG tablet Take 40 mg by mouth at  bedtime.     fexofenadine (ALLEGRA) 180 MG tablet Take 180 mg by mouth daily as needed.     fluticasone  (FLONASE ) 50 MCG/ACT nasal spray Place 2 sprays into both nostrils daily. 1 g 5   folic acid  (FOLVITE ) 1 MG tablet Take 1 tablet (1 mg total) by mouth daily. 30 tablet 0   furosemide  (LASIX ) 40 MG tablet Take 1 tablet (40 mg total) by mouth daily. 90 tablet 2   losartan (COZAAR) 25 MG tablet Take 12.5 mg by mouth See admin instructions. Take 12.5 mg by mouth only when taking Furosemide      Omega-3 Fatty Acids (FISH OIL PO) Take 1 tablet by mouth daily.     OZEMPIC, 1 MG/DOSE, 4 MG/3ML SOPN Inject 1 mg into the skin once a week.     pantoprazole  (PROTONIX ) 40 MG tablet TAKE 1 TABLET TWICE DAILY BEFORE MEALS 180 tablet 3   rosuvastatin  (CRESTOR ) 40 MG tablet Take 1 tablet (40 mg total) by mouth daily. 90 tablet 2   timolol  (TIMOPTIC ) 0.5 % ophthalmic solution Place 1 drop into both eyes in the morning and at bedtime.     TRAVATAN Z 0.004 % SOLN ophthalmic solution Place 1 drop into both eyes at bedtime.      albuterol  (VENTOLIN  HFA) 108 (90 Base) MCG/ACT inhaler TAKE 2 PUFFS BY MOUTH EVERY 6 HOURS AS NEEDED FOR WHEEZE OR SHORTNESS OF BREATH (Patient not taking: Reported on 09/21/2024) 18 each 0   cyanocobalamin  (VITAMIN B12) 500 MCG tablet Take 1 tablet (500 mcg total) by mouth daily. (Patient not taking: Reported on 09/21/2024) 30 tablet 0   No current facility-administered  medications for this visit.    Allergies Allergies  Allergen Reactions   Grass Pollen(K-O-R-T-Swt Vern) Other (See Comments)    Sneezing, mucous     Histories Past Medical History:  Diagnosis Date   Allergic rhinitis    Anemia 2002   Anxiety    Arthritis    Asthma    Blood clot in vein    Cellulitis    Cellulitis of right lower extremity    Chronic diastolic (congestive) heart failure (HCC)    Chronic kidney disease, stage 3 (HCC)    Coagulation defect    Cough    Depression 02/06/2008   Diabetes mellitus,  type 2 (HCC)    DM2 (diabetes mellitus, type 2) (HCC) 11/18/2022   DVT (deep venous thrombosis) (HCC) 2002   left leg   Dysphagia    Elevated hemoglobin A1c    GERD (gastroesophageal reflux disease)    Glaucoma    Holosystolic murmur    Hyperlipidemia 01/29/2023   Kidney cysts    Leukocytosis    Liver cyst    Lymphedema    Morbid obesity (HCC)    Overactive bladder    Palpitations    Peripheral venous insufficiency    Primary open angle glaucoma (POAG) of both eyes, mild stage    Seasonal allergies    Sickle cell trait    Tortuous aorta    Unstable gait    Upper airway cough syndrome    Vitamin D deficiency    Past Surgical History:  Procedure Laterality Date   COLONOSCOPY     COSMETIC SURGERY  09/13/1973   nasal reconstruction   TOTAL ABDOMINAL HYSTERECTOMY  07/14/2001   Social History   Socioeconomic History   Marital status: Single    Spouse name: Not on file   Number of children: 0   Years of education: Not on file   Highest education level: Not on file  Occupational History   Occupation: retired from [employer]  Tobacco Use   Smoking status: Never   Smokeless tobacco: Never  Vaping Use   Vaping status: Never Used  Substance and Sexual Activity   Alcohol use: No   Drug use: No   Sexual activity: Never  Other Topics Concern   Not on file  Social History Narrative   Lives with mother and brother         Social Drivers of Health   Tobacco Use: Low Risk (09/12/2024)   Patient History    Smoking Tobacco Use: Never    Smokeless Tobacco Use: Never    Passive Exposure: Not on file  Financial Resource Strain: Not on file  Food Insecurity: No Food Insecurity (01/29/2023)   Hunger Vital Sign    Worried About Running Out of Food in the Last Year: Never true    Ran Out of Food in the Last Year: Never true  Transportation Needs: No Transportation Needs (01/29/2023)   PRAPARE - Administrator, Civil Service (Medical): No     Lack of Transportation (Non-Medical): No  Physical Activity: Not on file  Stress: Not on file  Social Connections: Not on file  Intimate Partner Violence: Not At Risk (01/29/2023)   Humiliation, Afraid, Rape, and Kick questionnaire    Fear of Current or Ex-Partner: No    Emotionally Abused: No    Physically Abused: No    Sexually Abused: No  Depression (PHQ2-9): Not on file  Alcohol Screen: Not on file  Housing: Low Risk (01/29/2023)  Housing    Last Housing Risk Score: 0  Utilities: Not At Risk (01/29/2023)   AHC Utilities    Threatened with loss of utilities: No  Health Literacy: Not on file   Family History  Problem Relation Age of Onset   Allergies Mother    Asthma Mother    Other Mother        enlarged heart   Arthritis Mother    Breast cancer Mother    Diabetes Mother    Hypertension Mother    Cancer Mother        breast   Hyperlipidemia Mother    Cancer Father 10       leukemia   Colon polyps Sister    Allergies Sister    Arthritis Sister    Heart disease Maternal Grandmother    Breast cancer Maternal Grandmother    Birth defects Maternal Grandmother        breast   Colon cancer Neg Hx    Esophageal cancer Neg Hx    Inflammatory bowel disease Neg Hx    Liver disease Neg Hx    Pancreatic cancer Neg Hx    Rectal cancer Neg Hx    Stomach cancer Neg Hx    I have reviewed her medical, social, and family history in detail and updated the electronic medical record as necessary.    PHYSICAL EXAMINATION  BP 130/80   Pulse 86   Ht 5' 7 (1.702 m)   Wt 227 lb (103 kg)   BMI 35.55 kg/m  Wt Readings from Last 3 Encounters:  09/21/24 227 lb (103 kg)  09/12/24 220 lb (99.8 kg)  02/24/24 209 lb (94.8 kg)  GEN: NAD, appears older than stated age, appears chronically ill but is nontoxic, accompanied by sister PSYCH: Cooperative, without pressured speech EYE: Conjunctivae pink, sclerae anicteric ENT: MMM CV: Nontachycardic RESP: No audible wheezing GI: NABS,  soft, protuberant abdomen, NT/ND, without rebound MSK/EXT: Trace bilateral pedal edema SKIN: No jaundice NEURO:  Alert & Oriented x 3, no focal deficits   REVIEW OF DATA  I reviewed the following data at the time of this encounter:  GI Procedures and Studies  3/25 EGD - No gross lesions in the entire esophagus. Biopsied. - Z-line irregular, 38 cm from the incisors. - 2 cm hiatal hernia. - Multiple gastric polyps - fundic gland in appearance. Biopsied. - Erythematous mucosa in the stomach. Biopsied. - No gross lesions in the duodenal bulb, in the first portion of the duodenum and in the second portion of the duodenum.  Pathology  FINAL DIAGNOSIS      1. Surgical [P], gastric random :      REACTIVE GASTROPATHY WITH MILD CHRONIC GASTRITIS      NEGATIVE FOR H. PYLORI, INTESTINAL METAPLASIA, DYSPLASIA AND CARCINOMA       2. Surgical [P], gastric polyps :      MINIMAL CHRONIC GASTRITIS WITH LYMPHOID AGGREGATES AND REACTIVE EPITHELIAL      CHANGES      FOCAL CHANGES COMPATIBLE WITH EARLY FUNDIC GLAND POLYP      NEGATIVE FOR H. PYLORI, INTESTINAL METAPLASIA, DYSPLASIA AND CARCINOMA       3. Surgical [P], random esophagus :      REACTIVE SQUAMOUS MUCOSA WITH CHANGES SUGGESTIVE OF REFLUX ESOPHAGITIS      NEGATIVE FOR GLANDULAR EPITHELIUM, EOSINOPHILS, DYSPLASIA AND CARCINOMA   Laboratory Studies  Reviewed those in epic  Imaging Studies  No new imaging studies to review   ASSESSMENT  Ms.  Duffy is a 74 y.o. female  with a pmh significant for Diabetes, hypertension, hyperlipidemia, obesity, sickle cell trait, previous VTE, asthma, arthritis, GERD, IBS.  The patient is seen today for evaluation and management of:  No diagnosis found.  The patient remains hemodynamically stable.  Clinically, it is not clear to me that all of her symptoms are related to GERD especially in regards to her chronic cough.  Though she does state that she is approximately 60% better on her current PPI regimen  and H2 RA regimen.  We will trying to see how the patient does from a nonerosive reflux disease perspective by transitioning to Voquenza.  If she tolerates this and feels better, then we will continue to move forward with treatment as such.  If this does not make an improvement for her, then we will pursue pH impedance testing and manometry testing.  I am not sure if hiatal hernia repair needs to be pursued.  I do not want to have her hiatal hernia repair if this is not going to have improvement for her symptoms.  All patient questions were answered to the best of my ability, and the patient agrees to the aforementioned plan of action with follow-up as indicated.   PLAN  Transition to Voquenza 10 mg daily plus H2RA nightly - If this is helpful we will continue - Otherwise can go back to PPI/H2RA - Take 30 minutes before meal pH impedance testing on PPI will be next step in evaluation if patient does not have further improvement with Voquenza Manometry testing would be considered at same time in case hiatal hernia will need to be repaired as well Colonoscopy recall for surveillance in 2027   No orders of the defined types were placed in this encounter.   Modified Medications   No medications on file    Planned Follow Up No follow-ups on file.   Total Time in Face-to-Face and in Coordination of Care for patient including independent/personal interpretation/review of prior testing, medical history, examination, medication adjustment, communicating results with the patient directly, and documentation within the EHR is 25 minutes.   Aloha Finner, MD [facility] Office # [phone]  "

## 2024-09-21 NOTE — Patient Instructions (Addendum)
 No changes at this time.   Keep follow-up with pulmonology.   Follow-up 5 months. Office will contact you to schedule at a later time.   _______________________________________________________  If your blood pressure at your visit was 140/90 or greater, please contact your primary care physician to follow up on this.  _______________________________________________________  If you are age 74 or older, your body mass index should be between 23-30. Your Body mass index is 35.55 kg/m. If this is out of the aforementioned range listed, please consider follow up with your Primary Care Provider.  If you are age 11 or younger, your body mass index should be between 19-25. Your Body mass index is 35.55 kg/m. If this is out of the aformentioned range listed, please consider follow up with your Primary Care Provider.   ________________________________________________________  The [facility] providers would like to encourage you to use MYCHART to communicate with providers for non-urgent requests or questions.  Due to long hold times on the telephone, sending your provider a message by [facility] may be a faster and more efficient way to get a response.  Please allow 48 business hours for a response.  Please remember that this is for non-urgent requests.  _______________________________________________________  [facility] is using a team-based approach to care.  Your team is made up of your doctor and two to three APPS. Our APPS (Nurse Practitioners and Physician Assistants) work with your physician to ensure care continuity for you. They are fully qualified to address your health concerns and develop a treatment plan. They communicate directly with your gastroenterologist to care for you. Seeing the Advanced Practice Practitioners on your physician's team can help you by facilitating care more promptly, often allowing for earlier appointments, access to diagnostic testing, procedures, and  other specialty referrals.   Thank you for choosing me and [facility].  Dr. Wilhelmenia    Hiatal Hernia  A hiatal hernia occurs when part of the stomach slides above the muscle that separates the abdomen from the chest (diaphragm). A person can be born with a hiatal hernia (congenital), or it may develop over time. In almost all cases of hiatal hernia, only the top part of the stomach pushes through the diaphragm. Many people have a hiatal hernia with no symptoms. The larger the hernia, the more likely it is that you will have symptoms. In some cases, a hiatal hernia allows stomach acid to flow back into the tube that carries food from your mouth to your stomach (esophagus). This may cause heartburn symptoms. The development of heartburn symptoms may mean that you have a condition called gastroesophageal reflux disease (GERD). What are the causes? This condition is caused by a weakness in the opening (hiatus) where the esophagus passes through the diaphragm to attach to the upper part of the stomach. A person may be born with a weakness in the hiatus, or a weakness can develop over time. What increases the risk? This condition is more likely to develop in: Older people. Age is a major risk factor for a hiatal hernia, especially if you are over the age of 36. Pregnant women. People who are overweight. People who have frequent constipation. What are the signs or symptoms? Symptoms of this condition usually develop in the form of GERD symptoms. Symptoms include: Heartburn. Upset stomach (indigestion). Trouble swallowing. Coughing or wheezing. Wheezing is making high-pitched whistling sounds when you breathe. Sore throat. Chest pain. Nausea and vomiting. How is this diagnosed? This condition may be diagnosed during testing for  GERD. Tests that may be done include: X-rays of your stomach or chest. An upper gastrointestinal (GI) series. This is an X-ray exam of your GI tract  that is taken after you swallow a chalky liquid that shows up clearly on the X-ray. Endoscopy. This is a procedure to look into your stomach using a thin, flexible tube that has a tiny camera and light on the end of it. How is this treated? This condition may be treated by: Dietary and lifestyle changes to help reduce GERD symptoms. Medicines. These may include: Over-the-counter antacids. Medicines that make your stomach empty more quickly. Medicines that block the production of stomach acid (H2 blockers). Stronger medicines to reduce stomach acid (proton pump inhibitors). Surgery to repair the hernia, if other treatments are not helping. If you have no symptoms, you may not need treatment. Follow these instructions at home: Lifestyle and activity Do not use any products that contain nicotine or tobacco. These products include cigarettes, chewing tobacco, and vaping devices, such as e-cigarettes. If you need help quitting, ask your health care provider. Try to achieve and maintain a healthy body weight. Avoid putting pressure on your abdomen. Anything that puts pressure on your abdomen increases the amount of acid that may be pushed up into your esophagus. Avoid bending over, especially after eating. Raise the head of your bed by putting blocks under the legs. This keeps your head and esophagus higher than your stomach. Do not wear tight clothing around your chest or stomach. Try not to strain when having a bowel movement, when urinating, or when lifting heavy objects. Eating and drinking Avoid foods that can worsen GERD symptoms. These may include: Fatty foods, like fried foods. Citrus fruits, like oranges or lemon. Other foods and drinks that contain acid, like orange juice or tomatoes. Spicy food. Chocolate. Eat frequent small meals instead of three large meals a day. This helps prevent your stomach from getting too full. Eat slowly. Do not lie down right after eating. Do not eat  1-2 hours before bed. Do not drink beverages with caffeine. These include cola, coffee, cocoa, and tea. Do not drink alcohol. General instructions Take over-the-counter and prescription medicines only as told by your health care provider. Keep all follow-up visits. Your health care provider will want to check that any new prescribed medicines are helping your symptoms. Contact a health care provider if: Your symptoms are not controlled with medicines or lifestyle changes. You are having trouble swallowing. You have coughing or wheezing that will not go away. Your pain is getting worse. Your pain spreads to your arms, neck, jaw, teeth, or back. You feel nauseous or you vomit. Get help right away if: You have shortness of breath. You vomit blood. You have bright red blood in your stools. You have black, tarry stools. These symptoms may be an emergency. Get help right away. Call 911. Do not wait to see if the symptoms will go away. Do not drive yourself to the hospital. Summary A hiatal hernia occurs when part of the stomach slides above the muscle that separates the abdomen from the chest. A person may be born with a weakness in the hiatus, or a weakness can develop over time. Symptoms of a hiatal hernia may include heartburn, trouble swallowing, or sore throat. Management of a hiatal hernia includes eating frequent small meals instead of three large meals a day. Get help right away if you vomit blood, have bright red blood in your stools, or have black, tarry stools.  This information is not intended to replace advice given to you by your health care provider. Make sure you discuss any questions you have with your health care provider. Document Revised: 10/27/2021 Document Reviewed: 10/27/2021 Elsevier Patient Education  2024 [employer].

## 2024-09-22 ENCOUNTER — Encounter: Payer: Self-pay | Admitting: Gastroenterology

## 2024-09-22 DIAGNOSIS — K317 Polyp of stomach and duodenum: Secondary | ICD-10-CM | POA: Insufficient documentation

## 2024-10-03 ENCOUNTER — Ambulatory Visit: Admitting: Podiatry

## 2024-10-03 ENCOUNTER — Encounter: Payer: Self-pay | Admitting: Podiatry

## 2024-10-03 DIAGNOSIS — B351 Tinea unguium: Secondary | ICD-10-CM | POA: Diagnosis not present

## 2024-10-03 DIAGNOSIS — N183 Chronic kidney disease, stage 3 unspecified: Secondary | ICD-10-CM

## 2024-10-03 DIAGNOSIS — M79675 Pain in left toe(s): Secondary | ICD-10-CM | POA: Diagnosis not present

## 2024-10-03 DIAGNOSIS — E0822 Diabetes mellitus due to underlying condition with diabetic chronic kidney disease: Secondary | ICD-10-CM

## 2024-10-03 DIAGNOSIS — M79674 Pain in right toe(s): Secondary | ICD-10-CM

## 2024-10-03 DIAGNOSIS — Z794 Long term (current) use of insulin: Secondary | ICD-10-CM

## 2024-10-03 NOTE — Progress Notes (Signed)
 This patient returns to my office for at risk foot care.  This patient requires this care by a professional since this patient will be at risk due to having Type 2 Diabetes, CKD and lymphedema.  This patient is unable to cut nails herself since the patient cannot reach her nails.These nails are painful walking and wearing shoes.  This patient presents for at risk foot care today.  General Appearance  Alert, conversant and in no acute stress.  Vascular  Dorsalis pedis and posterior tibial  pulses are  weakly palpable  bilaterally.  Capillary return is within normal limits  bilaterally. Temperature is within normal limits  bilaterally.Severe lymphedema.  Neurologic  Senn-Weinstein monofilament wire test within normal limits  bilaterally. Muscle power within normal limits bilaterally.  Nails Thick disfigured discolored nails with subungual debris  from hallux to fifth toes bilaterally. No evidence of bacterial infection or drainage bilaterally.  Orthopedic  No limitations of motion  feet .  No crepitus or effusions noted.  No bony pathology or digital deformities noted.Pes planus.  HAV  B/L.  Skin  normotropic skin with no porokeratosis noted bilaterally.  No signs of infections or ulcers noted.   Callus lateral aspect of both ankles due to lymphedema.  Onychomycosis  Pain in right toes  Pain in left toes  Callus  B/L.  Consent was obtained for treatment procedures.   Mechanical debridement of nails 1-5  bilaterally performed with a nail nipper.  Filed with dremel without incident.    Return office visit   10 weeks                   Told patient to return for periodic foot care and evaluation due to potential at risk complications.   Cordella Bold DPM

## 2024-10-17 ENCOUNTER — Encounter

## 2024-11-07 ENCOUNTER — Encounter: Admitting: Pulmonary Disease

## 2024-12-05 ENCOUNTER — Ambulatory Visit: Admitting: Podiatry

## 2025-01-08 ENCOUNTER — Ambulatory Visit: Admitting: Podiatry
